# Patient Record
Sex: Male | Born: 1940 | Race: White | Hispanic: No | Marital: Married | Smoking: Former smoker
Health system: Southern US, Community
[De-identification: ages and names within clinical notes are randomized; demographics above are authoritative.]

## PROBLEM LIST (undated history)

## (undated) DIAGNOSIS — D649 Anemia, unspecified: Secondary | ICD-10-CM

## (undated) DIAGNOSIS — I10 Essential (primary) hypertension: Secondary | ICD-10-CM

## (undated) DIAGNOSIS — I739 Peripheral vascular disease, unspecified: Secondary | ICD-10-CM

## (undated) DIAGNOSIS — I6529 Occlusion and stenosis of unspecified carotid artery: Secondary | ICD-10-CM

## (undated) DIAGNOSIS — C801 Malignant (primary) neoplasm, unspecified: Secondary | ICD-10-CM

## (undated) DIAGNOSIS — R0600 Dyspnea, unspecified: Secondary | ICD-10-CM

## (undated) DIAGNOSIS — J189 Pneumonia, unspecified organism: Secondary | ICD-10-CM

## (undated) DIAGNOSIS — I2510 Atherosclerotic heart disease of native coronary artery without angina pectoris: Secondary | ICD-10-CM

## (undated) DIAGNOSIS — I255 Ischemic cardiomyopathy: Secondary | ICD-10-CM

## (undated) DIAGNOSIS — M1990 Unspecified osteoarthritis, unspecified site: Secondary | ICD-10-CM

## (undated) DIAGNOSIS — E1165 Type 2 diabetes mellitus with hyperglycemia: Secondary | ICD-10-CM

## (undated) DIAGNOSIS — I951 Orthostatic hypotension: Secondary | ICD-10-CM

## (undated) DIAGNOSIS — I5022 Chronic systolic (congestive) heart failure: Secondary | ICD-10-CM

## (undated) DIAGNOSIS — E785 Hyperlipidemia, unspecified: Secondary | ICD-10-CM

## (undated) DIAGNOSIS — M109 Gout, unspecified: Secondary | ICD-10-CM

## (undated) DIAGNOSIS — N182 Chronic kidney disease, stage 2 (mild): Secondary | ICD-10-CM

## (undated) DIAGNOSIS — IMO0001 Reserved for inherently not codable concepts without codable children: Secondary | ICD-10-CM

## (undated) DIAGNOSIS — R590 Localized enlarged lymph nodes: Secondary | ICD-10-CM

## (undated) DIAGNOSIS — J449 Chronic obstructive pulmonary disease, unspecified: Secondary | ICD-10-CM

## (undated) HISTORY — DX: Chronic systolic (congestive) heart failure: I50.22

## (undated) HISTORY — DX: Ischemic cardiomyopathy: I25.5

## (undated) HISTORY — DX: Essential (primary) hypertension: I10

## (undated) HISTORY — PX: CATARACT EXTRACTION: SUR2

## (undated) HISTORY — PX: CHOLECYSTECTOMY: SHX55

## (undated) HISTORY — DX: Orthostatic hypotension: I95.1

## (undated) HISTORY — PX: MELANOMA EXCISION: SHX5266

## (undated) HISTORY — PX: SKIN LESION EXCISION: SHX2412

## (undated) HISTORY — DX: Reserved for inherently not codable concepts without codable children: IMO0001

## (undated) HISTORY — DX: Peripheral vascular disease, unspecified: I73.9

## (undated) HISTORY — DX: Chronic kidney disease, stage 2 (mild): N18.2

## (undated) HISTORY — DX: Hyperlipidemia, unspecified: E78.5

## (undated) HISTORY — DX: Occlusion and stenosis of unspecified carotid artery: I65.29

## (undated) HISTORY — DX: Atherosclerotic heart disease of native coronary artery without angina pectoris: I25.10

## (undated) HISTORY — DX: Type 2 diabetes mellitus with hyperglycemia: E11.65

## (undated) HISTORY — PX: COLONOSCOPY: SHX174

## (undated) HISTORY — PX: HERNIA REPAIR: SHX51

## (undated) HISTORY — DX: Gout, unspecified: M10.9

---

## 2012-07-13 ENCOUNTER — Other Ambulatory Visit: Payer: Self-pay | Admitting: Internal Medicine

## 2012-07-13 DIAGNOSIS — I779 Disorder of arteries and arterioles, unspecified: Secondary | ICD-10-CM

## 2012-07-20 ENCOUNTER — Ambulatory Visit
Admission: RE | Admit: 2012-07-20 | Discharge: 2012-07-20 | Disposition: A | Discharge status: Home / Self Care | Payer: Medicare Other | Source: Ambulatory Visit | Attending: Internal Medicine | Admitting: Internal Medicine

## 2012-07-20 DIAGNOSIS — I779 Disorder of arteries and arterioles, unspecified: Secondary | ICD-10-CM

## 2013-01-03 ENCOUNTER — Other Ambulatory Visit: Payer: Self-pay | Admitting: *Deleted

## 2013-01-03 MED ORDER — ATORVASTATIN CALCIUM 40 MG PO TABS: 40.0000 mg | ORAL_TABLET | Freq: Every day | ORAL | Status: DC

## 2013-01-25 ENCOUNTER — Telehealth: Payer: Self-pay | Admitting: Endocrinology

## 2013-01-25 ENCOUNTER — Other Ambulatory Visit: Payer: Self-pay | Admitting: *Deleted

## 2013-01-25 DIAGNOSIS — IMO0001 Reserved for inherently not codable concepts without codable children: Secondary | ICD-10-CM | POA: Insufficient documentation

## 2013-01-25 DIAGNOSIS — E119 Type 2 diabetes mellitus without complications: Secondary | ICD-10-CM

## 2013-01-25 DIAGNOSIS — E785 Hyperlipidemia, unspecified: Secondary | ICD-10-CM | POA: Insufficient documentation

## 2013-01-25 MED ORDER — SITAGLIPTIN PHOSPHATE 100 MG PO TABS: 100.0000 mg | ORAL_TABLET | Freq: Every day | ORAL | Status: DC

## 2013-01-25 NOTE — Telephone Encounter (Signed)
rx sent

## 2013-01-28 ENCOUNTER — Other Ambulatory Visit (INDEPENDENT_AMBULATORY_CARE_PROVIDER_SITE_OTHER): Payer: Medicare Other

## 2013-01-28 DIAGNOSIS — E119 Type 2 diabetes mellitus without complications: Secondary | ICD-10-CM

## 2013-01-28 DIAGNOSIS — E785 Hyperlipidemia, unspecified: Secondary | ICD-10-CM

## 2013-01-28 LAB — URINALYSIS, ROUTINE W REFLEX MICROSCOPIC
Bilirubin Urine: NEGATIVE
Hgb urine dipstick: NEGATIVE
Ketones, ur: NEGATIVE
Leukocytes, UA: NEGATIVE
Nitrite: NEGATIVE
RBC / HPF: NONE SEEN (ref 0–?)
Specific Gravity, Urine: 1.025 (ref 1.000–1.030)
Total Protein, Urine: 30
Urine Glucose: NEGATIVE
Urobilinogen, UA: 0.2 (ref 0.0–1.0)
pH: 6 (ref 5.0–8.0)

## 2013-01-28 LAB — COMPREHENSIVE METABOLIC PANEL
ALT: 17 U/L (ref 0–53)
AST: 16 U/L (ref 0–37)
Albumin: 3.9 g/dL (ref 3.5–5.2)
Alkaline Phosphatase: 52 U/L (ref 39–117)
BUN: 17 mg/dL (ref 6–23)
CO2: 25 mEq/L (ref 19–32)
Calcium: 9.2 mg/dL (ref 8.4–10.5)
Chloride: 112 mEq/L (ref 96–112)
Creatinine, Ser: 1.4 mg/dL (ref 0.4–1.5)
GFR: 53.86 mL/min — ABNORMAL LOW (ref >60.00)
Glucose, Bld: 176 mg/dL — ABNORMAL HIGH (ref 70–99)
Potassium: 4.5 mEq/L (ref 3.5–5.1)
Sodium: 140 mEq/L (ref 135–145)
Total Bilirubin: 0.6 mg/dL (ref 0.3–1.2)
Total Protein: 5.9 g/dL — ABNORMAL LOW (ref 6.0–8.3)

## 2013-01-28 LAB — MICROALBUMIN / CREATININE URINE RATIO
Creatinine,U: 171.7 mg/dL
Microalb Creat Ratio: 12.8 mg/g (ref 0.0–30.0)
Microalb, Ur: 21.9 mg/dL — ABNORMAL HIGH (ref 0.0–1.9)

## 2013-01-28 LAB — LIPID PANEL
Cholesterol: 111 mg/dL (ref 0–200)
HDL: 39.7 mg/dL (ref >39.00)
LDL Cholesterol: 55 mg/dL (ref 0–99)
Total CHOL/HDL Ratio: 3
Triglycerides: 81 mg/dL (ref 0.0–149.0)
VLDL: 16.2 mg/dL (ref 0.0–40.0)

## 2013-01-28 LAB — HEMOGLOBIN A1C: Hgb A1c MFr Bld: 8.2 % — ABNORMAL HIGH (ref 4.6–6.5)

## 2013-01-31 ENCOUNTER — Ambulatory Visit: Payer: Medicare Other | Admitting: Endocrinology

## 2013-02-07 ENCOUNTER — Other Ambulatory Visit: Payer: Self-pay | Admitting: *Deleted

## 2013-02-07 ENCOUNTER — Ambulatory Visit (INDEPENDENT_AMBULATORY_CARE_PROVIDER_SITE_OTHER): Payer: Medicare Other | Admitting: Endocrinology

## 2013-02-07 ENCOUNTER — Encounter: Payer: Self-pay | Admitting: Endocrinology

## 2013-02-07 VITALS — BP 146/68 | HR 60 | Temp 98.6°F | Resp 12 | Ht 73.0 in | Wt 174.7 lb

## 2013-02-07 DIAGNOSIS — IMO0001 Reserved for inherently not codable concepts without codable children: Secondary | ICD-10-CM

## 2013-02-07 MED ORDER — CANAGLIFLOZIN 100 MG PO TABS: 100.0000 mg | ORAL_TABLET | Freq: Every day | ORAL | Status: DC

## 2013-02-07 NOTE — Progress Notes (Signed)
Patient ID: Mario [name], male   DOB: Dec 06, 1940, 72 y.o.   MRN: [id number]  Mario [name] is an 72 y.o. male.   Reason for Appointment: Diabetes follow-up   History of Present Illness   Diagnosis: Type 2 DIABETES MELITUS, date of diagnosis:  1989     Previous history: He was initially diagnosed when hospitalized for pancreatitis and his previous endocrinologist had treated him with multiple medications because of progression of his diabetes. He was also put on Cycloset  which he has tolerated maximum dose He has had adjustments of his medication dosages the last couple of years and Januvia was restarted in 5/14 when blood sugars were higher. Dosages have been adjusted based on renal function Previously an A1c levels have been ranging from 6.6-7.5  Recent history:  His metformin was increased on the previous visit when her renal function was better and glipizide was changed to glipizide ER. However he thinks his blood sugars are relatively higher although he is checking readings mostly in the mornings only. Blood sugars are averaging about 150 in the morning compared to 146 on the last visit. However his A1c is significantly higher than before  Oral hypoglycemic drugs: Januvia, Cycloset, Glyset, glipizide,? Metformin        Side effects from medications: None Proper timing of medications in relation to meals:  no, he takes Glyset about an hour before breakfast and sometime after supper.    Monitors blood glucose: Once a day.    Glucometer:  contour         Blood Glucose readings from meter download: readings before breakfast: Averaging 150 range 144-163, 4 PM 154, 11 PM 51   Hypoglycemia frequency:  only once        Meals: 3 meals per day.          Physical activity: exercise: Less recently, only 1-2/7 days a week playing tennis             Wt Readings from Last 3 Encounters:  02/07/13 174 lb 11.2 oz (79.243 kg)    LABS:  No visits with results within 1 Week(s) from this  visit. Latest known visit with results is:  Appointment on 01/28/2013  Component Date Value Range Status  . Hemoglobin A1C 01/28/2013 8.2* 4.6 - 6.5 % Final   Glycemic Control Guidelines for People with Diabetes:Non Diabetic:  <6%Goal of Therapy: <7%Additional Action Suggested:  >8%   . Microalb, Ur 01/28/2013 21.9* 0.0 - 1.9 mg/dL Final  . Creatinine,U 14/78/2956 171.7   Final  . Microalb Creat Ratio 01/28/2013 12.8  0.0 - 30.0 mg/g Final  . Sodium 01/28/2013 140  135 - 145 mEq/L Final  . Potassium 01/28/2013 4.5  3.5 - 5.1 mEq/L Final  . Chloride 01/28/2013 112  96 - 112 mEq/L Final  . CO2 01/28/2013 25  19 - 32 mEq/L Final  . Glucose, Bld 01/28/2013 176* 70 - 99 mg/dL Final  . BUN 21/30/8657 17  6 - 23 mg/dL Final  . Creatinine, Ser 01/28/2013 1.4  0.4 - 1.5 mg/dL Final  . Total Bilirubin 01/28/2013 0.6  0.3 - 1.2 mg/dL Final  . Alkaline Phosphatase 01/28/2013 52  39 - 117 U/L Final  . AST 01/28/2013 16  0 - 37 U/L Final  . ALT 01/28/2013 17  0 - 53 U/L Final  . Total Protein 01/28/2013 5.9* 6.0 - 8.3 g/dL Final  . Albumin 84/69/6295 3.9  3.5 - 5.2 g/dL Final  . Calcium 28/41/3244 9.2  8.4 -  10.5 mg/dL Final  . GFR 16/02/9603 53.86* >60.00 mL/min Final  . Cholesterol 01/28/2013 111  0 - 200 mg/dL Final   ATP III Classification       Desirable:  < 200 mg/dL               Borderline High:  200 - 239 mg/dL          High:  > = 540 mg/dL  . Triglycerides 01/28/2013 81.0  0.0 - 149.0 mg/dL Final   Normal:  <981 mg/dLBorderline High:  150 - 199 mg/dL  . HDL 01/28/2013 39.70  >39.00 mg/dL Final  . VLDL 19/14/7829 16.2  0.0 - 40.0 mg/dL Final  . LDL Cholesterol 01/28/2013 55  0 - 99 mg/dL Final  . Total CHOL/HDL Ratio 01/28/2013 3   Final                  Men          Women1/2 Average Risk     3.4          3.3Average Risk          5.0          4.42X Average Risk          9.6          7.13X Average Risk          15.0          11.0                      . Color, Urine 01/28/2013 LT. YELLOW   Yellow;Lt. Yellow Final  . APPearance 01/28/2013 CLEAR  Clear Final  . Specific Gravity, Urine 01/28/2013 1.025  1.000-1.030 Final  . pH 01/28/2013 6.0  5.0 - 8.0 Final  . Total Protein, Urine 01/28/2013 30  Negative Final  . Urine Glucose 01/28/2013 NEGATIVE  Negative Final  . Ketones, ur 01/28/2013 NEGATIVE  Negative Final  . Bilirubin Urine 01/28/2013 NEGATIVE  Negative Final  . Hgb urine dipstick 01/28/2013 NEGATIVE  Negative Final  . Urobilinogen, UA 01/28/2013 0.2  0.0 - 1.0 Final  . Leukocytes, UA 01/28/2013 NEGATIVE  Negative Final  . Nitrite 01/28/2013 NEGATIVE  Negative Final  . WBC, UA 01/28/2013 0-2/hpf  0-2/hpf Final  . RBC / HPF 01/28/2013 none seen  0-2/hpf Final  . Squamous Epithelial / LPF 01/28/2013 Rare(0-4/hpf)  Rare(0-4/hpf) Final  . Bacteria, UA 01/28/2013 Rare(<10/hpf)  None Final  . Sperm, UA 01/28/2013 Presence of  None Final      Medication List       This list is accurate as of: 02/07/13  2:01 PM.  Always use your most recent med list.               allopurinol 100 MG tablet  Commonly known as:  ZYLOPRIM     atorvastatin 40 MG tablet  Commonly known as:  LIPITOR  Take 1 tablet (40 mg total) by mouth daily.     CYCLOSET 0.8 MG Tabs  Generic drug:  Bromocriptine Mesylate     fenofibrate micronized 43 MG capsule  Commonly known as:  ANTARA     glipiZIDE 10 MG 24 hr tablet  Commonly known as:  GLUCOTROL XL     GLYSET 50 MG tablet  Generic drug:  miglitol     nebivolol 10 MG tablet  Commonly known as:  BYSTOLIC  Take 10 mg by mouth daily.     sitaGLIPtin 100 MG  tablet  Commonly known as:  JANUVIA  Take 1 tablet (100 mg total) by mouth daily.     tamsulosin 0.4 MG Caps capsule  Commonly known as:  FLOMAX        Allergies:  Allergies  Allergen Reactions  . Penicillins   . Plavix [Clopidogrel Bisulfate] Hives    No past medical history on file.  No past surgical history on file.  No family history on file.  Social History:   reports that he has been smoking.  He has never used smokeless tobacco. His alcohol and drug histories are not on file.  Review of Systems:  Hypertension:  currently only on Bystolic, also followed by cardiologist  Lipids: Well controlled with atorvastatin Lab Results  Component Value Date   LDLCALC 55 01/28/2013         Examination:   BP 146/68  Pulse 60  Temp(Src) 98.6 F (37 C)  Resp 12  Ht 6\' 1"  (1.854 m)  Wt 174 lb 11.2 oz (79.243 kg)  BMI 23.05 kg/m2  SpO2 97%  Body mass index is 23.05 kg/(m^2).    ASSESSMENT/ PLAN::   Diabetes type 2   Blood glucose control relatively worse with increasing A1c. This is despite his fasting readings looking about the same as the last time He is not checking readings after meals and most likely has post prandial hyperglycemia. This is partly from his not taking his Glyset before eating especially at suppertime and taking it much before breakfast in the morning. He is not obese and since his renal function is improved may be able to get better controlled with using Invokana 100 mg. He is also concerned about the cost of Cycloset tablets and then stop this when finished. He can continue his other regimen of glipizide ER, Glyset and Januvia. Need to check if he is taking metformin He will need to take his Glyset before his main meals and discussed timing of this medication Discussed needing to check more readings after meals He can also try to increase his exercise regimen which he has not been able to do regularly  Hyperlipidemia: LDL below 70  Mario [name] 02/07/2013, 2:01 PM

## 2013-02-07 NOTE — Patient Instructions (Addendum)
Invokana 100 mg in am instead of Cycloset  Must take GLYSET JUST BEFORE BFST AND SUPPER  Please check blood sugars at least half the time about 2 hours after any meal and as directed on waking up. Please bring blood sugar monitor to each visit  Check BP weekly

## 2013-02-11 ENCOUNTER — Telehealth: Payer: Self-pay | Admitting: Endocrinology

## 2013-02-11 NOTE — Telephone Encounter (Signed)
Requests referral to an eye doc and podiatrist. Please call / Sherri S.

## 2013-02-12 ENCOUNTER — Telehealth: Payer: Self-pay | Admitting: *Deleted

## 2013-02-12 NOTE — Telephone Encounter (Signed)
He can call himself to [facility] and also either Dr. Hazle Quant or Dr. Dione Booze Also need to find out if he is taking metformin, not on his list for unknown reason

## 2013-02-12 NOTE — Telephone Encounter (Signed)
Called pt and lvm advising him per Dr Lucianne Muss that he does not need a referral to the Podiatrist or the Eye Doctor. He can call and schedule and appt himself to [facility] and for and Eye Doctor Dr. Hazle Quant or Dr. Dione Booze. I asked if he is taking metformin, it is not on his list for unknown reason. Advised pt to call Carollee Herter or Bjorn Loser with that information.

## 2013-02-12 NOTE — Telephone Encounter (Signed)
Please read note below and advise.  

## 2013-02-15 ENCOUNTER — Other Ambulatory Visit: Payer: Self-pay | Admitting: *Deleted

## 2013-04-01 ENCOUNTER — Encounter: Payer: Self-pay | Admitting: Interventional Cardiology

## 2013-04-09 ENCOUNTER — Other Ambulatory Visit (INDEPENDENT_AMBULATORY_CARE_PROVIDER_SITE_OTHER): Payer: Medicare Other

## 2013-04-09 ENCOUNTER — Other Ambulatory Visit: Payer: Medicare Other

## 2013-04-09 DIAGNOSIS — IMO0001 Reserved for inherently not codable concepts without codable children: Secondary | ICD-10-CM

## 2013-04-09 LAB — BASIC METABOLIC PANEL
BUN: 19 mg/dL (ref 6–23)
CO2: 24 mEq/L (ref 19–32)
Calcium: 9.5 mg/dL (ref 8.4–10.5)
Chloride: 108 mEq/L (ref 96–112)
Creatinine, Ser: 1.3 mg/dL (ref 0.4–1.5)
GFR: 59.25 mL/min — ABNORMAL LOW (ref >60.00)
Glucose, Bld: 176 mg/dL — ABNORMAL HIGH (ref 70–99)
Potassium: 4.3 mEq/L (ref 3.5–5.1)
Sodium: 137 mEq/L (ref 135–145)

## 2013-04-09 LAB — FRUCTOSAMINE: Fructosamine: 298 umol/L — ABNORMAL HIGH (ref <285)

## 2013-04-11 ENCOUNTER — Encounter: Payer: Self-pay | Admitting: Endocrinology

## 2013-04-11 ENCOUNTER — Ambulatory Visit (INDEPENDENT_AMBULATORY_CARE_PROVIDER_SITE_OTHER): Payer: Medicare Other | Admitting: Endocrinology

## 2013-04-11 VITALS — BP 138/70 | HR 71 | Temp 98.6°F | Resp 12 | Ht 73.0 in | Wt 170.4 lb

## 2013-04-11 DIAGNOSIS — E785 Hyperlipidemia, unspecified: Secondary | ICD-10-CM

## 2013-04-11 DIAGNOSIS — IMO0001 Reserved for inherently not codable concepts without codable children: Secondary | ICD-10-CM

## 2013-04-11 DIAGNOSIS — N182 Chronic kidney disease, stage 2 (mild): Secondary | ICD-10-CM

## 2013-04-11 NOTE — Progress Notes (Signed)
Patient ID: Mario [name], male   DOB: 11/17/1940, 72 y.o.   MRN: [id number]  Mario [name] is an 72 y.o. male.   Reason for Appointment: Diabetes follow-up   History of Present Illness   Diagnosis: Type 2 DIABETES MELITUS, date of diagnosis:  1989     Previous history: He was initially diagnosed when hospitalized for pancreatitis and his previous endocrinologist had treated him with multiple medications because of progression of his diabetes. He was also put on Cycloset  which he has tolerated Mario dose He has had adjustments of his medication dosages the last couple of years and Januvia was restarted in 5/14 when blood sugars were higher. Dosages have been adjusted based on renal function Previously an A1c levels have been ranging from 6.6-7.5 His metformin was increased  when renal function was better and glipizide was changed to glipizide ER 10 mg.  Recent history: He was given a trial of Invokana in addition to his regimen of metformin, glipizide ER, Januvia and Glyset on his last visit However he thinks his blood sugars did not improve with this but they are relatively better recently He was also told to change his timing of the Glyset before meals rather than after Has not taken Invokana recently and also not taking Cycloset because of cost However he is checking readings mostly in the mornings only.  Blood sugars are averaging about 140 in the morning compared to 150 on the last visit. On his last visit  his A1c was significantly higher than before but his fructosamine is near normal now  Oral hypoglycemic drugs: Januvia, Glyset, glipizide, Metformin        Side effects from medications: None Proper timing of medications in relation to meals: Glyset  Monitors blood glucose: Once a day.    Glucometer:  contour         Blood Glucose readings from meter download: readings before breakfast: Averaging 140 range 115-172   Hypoglycemia frequency:  only once        Meals: 3 meals per  day.          Physical activity: exercise: Less recently            Wt Readings from Last 3 Encounters:  04/11/13 170 lb 6.4 oz (77.293 kg)  02/07/13 174 lb 11.2 oz (79.243 kg)    LABS:  Lab Results  Component Value Date   HGBA1C 8.2* 01/28/2013   Lab Results  Component Value Date   MICROALBUR 21.9* 01/28/2013   LDLCALC 55 01/28/2013   CREATININE 1.3 04/09/2013     Appointment on 04/09/2013  Component Date Value Range Status  . Sodium 04/09/2013 137  135 - 145 mEq/L Final  . Potassium 04/09/2013 4.3  3.5 - 5.1 mEq/L Final  . Chloride 04/09/2013 108  96 - 112 mEq/L Final  . CO2 04/09/2013 24  19 - 32 mEq/L Final  . Glucose, Bld 04/09/2013 176* 70 - 99 mg/dL Final  . BUN 11/91/4782 19  6 - 23 mg/dL Final  . Creatinine, Ser 04/09/2013 1.3  0.4 - 1.5 mg/dL Final  . Calcium 95/62/1308 9.5  8.4 - 10.5 mg/dL Final  . GFR 65/78/4696 59.25* >60.00 mL/min Final  . Fructosamine 04/09/2013 298* <285 umol/L Final   Comment:                            Variations in levels of serum proteins (albumin and immunoglobulins)  may affect fructosamine results.                                 Medication List       This list is accurate as of: 04/11/13 11:15 AM.  Always use your most recent med list.               allopurinol 100 MG tablet  Commonly known as:  ZYLOPRIM     atorvastatin 40 MG tablet  Commonly known as:  LIPITOR  Take 1 tablet (40 mg total) by mouth daily.     Canagliflozin 100 MG Tabs  Commonly known as:  INVOKANA  Take 1 tablet (100 mg total) by mouth daily.     CYCLOSET 0.8 MG Tabs  Generic drug:  Bromocriptine Mesylate     fenofibrate micronized 43 MG capsule  Commonly known as:  ANTARA     glipiZIDE 10 MG 24 hr tablet  Commonly known as:  GLUCOTROL XL     GLYSET 50 MG tablet  Generic drug:  miglitol     metFORMIN 1000 MG tablet  Commonly known as:  GLUCOPHAGE  Take 1,000 mg by mouth 2 (two) times daily with a meal.      nebivolol 10 MG tablet  Commonly known as:  BYSTOLIC  Take 10 mg by mouth daily. Taking 5 mg     sitaGLIPtin 100 MG tablet  Commonly known as:  JANUVIA  Take 1 tablet (100 mg total) by mouth daily.     tamsulosin 0.4 MG Caps capsule  Commonly known as:  FLOMAX        Allergies:  Allergies  Allergen Reactions  . Penicillins   . Plavix [Clopidogrel Bisulfate] Hives    No past medical history on file.  No past surgical history on file.  No family history on file.  Social History:  reports that he has been smoking.  He has never used smokeless tobacco. His alcohol and drug histories are not on file.  Review of Systems:  Hypertension:  currently only on Bystolic, also followed by cardiologist  Lipids: Well controlled with atorvastatin Lab Results  Component Value Date   LDLCALC 55 01/28/2013     Examination:   BP 138/70  Pulse 71  Temp(Src) 98.6 F (37 C)  Resp 12  Ht 6\' 1"  (1.854 m)  Wt 170 lb 6.4 oz (77.293 kg)  BMI 22.49 kg/m2  SpO2 97%  Body mass index is 22.49 kg/(m^2).    ASSESSMENT/ PLAN::   Diabetes type 2   Blood glucose control is relatively as judged by his fructosamine Although his A1c was 8.2% on the last visit this may have been more related to higher postprandial readings These may be better with taking his Glyset appropriately before his meals He reports no improvement with Invokana and will not retry. Discussed taking more readings after meals He would also benefit from starting back on his exercise program Given him a co-pay card for his [place] 04/11/2013, 11:15 AM

## 2013-04-11 NOTE — Patient Instructions (Addendum)
Please check blood sugars at least half the time about 2 hours after any meal and every 2 days on waking up.  Please bring blood sugar monitor to each visit  Exercise 2-3 times a week

## 2013-04-12 DIAGNOSIS — N182 Chronic kidney disease, stage 2 (mild): Secondary | ICD-10-CM | POA: Insufficient documentation

## 2013-04-17 ENCOUNTER — Telehealth: Payer: Self-pay | Admitting: Endocrinology

## 2013-04-17 NOTE — Telephone Encounter (Signed)
Labs mailed

## 2013-04-17 NOTE — Telephone Encounter (Signed)
Pt would like copy of his blood work  Call back [phone]  Thank You :)

## 2013-04-25 ENCOUNTER — Other Ambulatory Visit: Payer: Self-pay | Admitting: *Deleted

## 2013-04-25 MED ORDER — TAMSULOSIN HCL 0.4 MG PO CAPS: 0.4000 mg | ORAL_CAPSULE | Freq: Every day | ORAL | Status: DC

## 2013-05-03 ENCOUNTER — Other Ambulatory Visit: Payer: Self-pay | Admitting: *Deleted

## 2013-05-03 MED ORDER — GLIPIZIDE ER 10 MG PO TB24: 10.0000 mg | ORAL_TABLET | Freq: Every day | ORAL | Status: DC

## 2013-05-09 ENCOUNTER — Other Ambulatory Visit: Payer: Self-pay | Admitting: *Deleted

## 2013-05-09 MED ORDER — GLUCOSE BLOOD VI STRP: ORAL_STRIP | Status: DC

## 2013-05-09 MED ORDER — BAYER MICROLET LANCETS MISC: Status: DC

## 2013-05-27 ENCOUNTER — Other Ambulatory Visit: Payer: Self-pay | Admitting: *Deleted

## 2013-05-27 MED ORDER — METFORMIN HCL 1000 MG PO TABS: 1000.0000 mg | ORAL_TABLET | Freq: Two times a day (BID) | ORAL | Status: DC

## 2013-05-28 ENCOUNTER — Other Ambulatory Visit: Payer: Self-pay | Admitting: *Deleted

## 2013-05-28 ENCOUNTER — Telehealth: Payer: Self-pay | Admitting: *Deleted

## 2013-05-28 MED ORDER — GLIPIZIDE ER 10 MG PO TB24: 10.0000 mg | ORAL_TABLET | Freq: Every day | ORAL | Status: DC

## 2013-05-28 MED ORDER — ATORVASTATIN CALCIUM 40 MG PO TABS: 40.0000 mg | ORAL_TABLET | Freq: Every day | ORAL | Status: DC

## 2013-05-28 MED ORDER — ALLOPURINOL 100 MG PO TABS: 100.0000 mg | ORAL_TABLET | Freq: Every day | ORAL | Status: DC

## 2013-05-28 NOTE — Telephone Encounter (Signed)
cycloset would be cheaper than what Rx? He  can take the fenofibrate 48 mg instead of the 43 mg

## 2013-05-28 NOTE — Telephone Encounter (Signed)
Pt called and wants [place] to call his new insurance company to get a step therapy approved for his medication, he said the cycloset would be cheaper for him.  He also wants to know if he can take the fenofibrate 48 mg instead of the 43 mg, he said that would be a lot cheaper if he could change the dose.

## 2013-05-29 NOTE — Telephone Encounter (Signed)
Cycloset 6 tablets daily

## 2013-06-07 ENCOUNTER — Other Ambulatory Visit: Payer: Self-pay | Admitting: *Deleted

## 2013-06-11 ENCOUNTER — Other Ambulatory Visit: Payer: Self-pay | Admitting: Endocrinology

## 2013-06-11 ENCOUNTER — Other Ambulatory Visit: Payer: Medicare Other

## 2013-06-11 ENCOUNTER — Other Ambulatory Visit: Payer: Self-pay | Admitting: *Deleted

## 2013-06-11 LAB — COMPREHENSIVE METABOLIC PANEL
ALT: 25 U/L (ref 0–53)
AST: 25 U/L (ref 0–37)
Albumin: 4.1 g/dL (ref 3.5–5.2)
Alkaline Phosphatase: 55 U/L (ref 39–117)
BUN: 20 mg/dL (ref 6–23)
CO2: 22 mEq/L (ref 19–32)
Calcium: 9 mg/dL (ref 8.4–10.5)
Chloride: 108 mEq/L (ref 96–112)
Creat: 1.17 mg/dL (ref 0.50–1.35)
Glucose, Bld: 176 mg/dL — ABNORMAL HIGH (ref 70–99)
Potassium: 4.6 mEq/L (ref 3.5–5.3)
Sodium: 138 mEq/L (ref 135–145)
Total Bilirubin: 0.7 mg/dL (ref 0.3–1.2)
Total Protein: 6 g/dL (ref 6.0–8.3)

## 2013-06-11 LAB — HEMOGLOBIN A1C
Hgb A1c MFr Bld: 8.5 % — ABNORMAL HIGH (ref <5.7)
Mean Plasma Glucose: 197 mg/dL — ABNORMAL HIGH (ref <117)

## 2013-06-11 MED ORDER — NEBIVOLOL HCL 10 MG PO TABS: ORAL_TABLET | ORAL | Status: DC

## 2013-06-11 MED ORDER — MIGLITOL 50 MG PO TABS: 50.0000 mg | ORAL_TABLET | Freq: Three times a day (TID) | ORAL | Status: DC

## 2013-06-13 ENCOUNTER — Ambulatory Visit (INDEPENDENT_AMBULATORY_CARE_PROVIDER_SITE_OTHER): Payer: Commercial Managed Care - HMO | Admitting: Endocrinology

## 2013-06-13 ENCOUNTER — Encounter: Payer: Self-pay | Admitting: Endocrinology

## 2013-06-13 VITALS — BP 124/60 | HR 67 | Temp 98.2°F | Resp 14 | Ht 74.0 in | Wt 178.0 lb

## 2013-06-13 DIAGNOSIS — IMO0001 Reserved for inherently not codable concepts without codable children: Secondary | ICD-10-CM

## 2013-06-13 DIAGNOSIS — I10 Essential (primary) hypertension: Secondary | ICD-10-CM

## 2013-06-13 DIAGNOSIS — E1165 Type 2 diabetes mellitus with hyperglycemia: Principal | ICD-10-CM

## 2013-06-13 DIAGNOSIS — E785 Hyperlipidemia, unspecified: Secondary | ICD-10-CM

## 2013-06-13 NOTE — Progress Notes (Signed)
Patient ID: Mario [name], male   DOB: 1941/03/15, 73 y.o.   MRN: [id number]   Reason for Appointment: Diabetes follow-up   History of Present Illness   Diagnosis: Type 2 DIABETES MELITUS, date of diagnosis:  1989     Previous history: He was initially diagnosed when hospitalized for pancreatitis and his previous endocrinologist had treated him with multiple medications because of progression of his diabetes. He was also put on Cycloset  which he has tolerated maximum dose He has had adjustments of his medication dosages the last couple of years and Januvia was restarted in 5/14 when blood sugars were higher. Dosages have been adjusted based on renal function Previously an A1c levels have been ranging from 6.6-7.5 His metformin was increased  when renal function was better and glipizide was changed to glipizide ER 10 mg.  Recent history: He did not appear to have improved sugars with Invokana in addition to his regimen of metformin, glipizide ER, Januvia and Glyset  However since his blood sugars were somewhat better on his last visit his regimen was not changed. He was having relatively good readings in the mornings and was instructed to take Glyset more consistently right before eating rather than after eating to help postprandial hyperglycemia. He is not able to comply with this all the time  However he is again checking readings in the mornings only.  Blood sugars are averaging about 160 in the morning compared to 140 on the last visit. Also his A1c again higher than before even though his fructosamine had improved on the last visit His weight has also gone up significantly. His insurance company has denied Cycloset which he had taken previously with improvement in his blood sugars  Oral hypoglycemic drugs: Januvia, Glyset, glipizide, Metformin        Side effects from medications: None Proper timing of medications in relation to meals:  he is usually taking the Glyset right before  eating or when he is eating and occasionally after Monitors blood glucose: Once a day.    Glucometer:  contour         Blood Glucose readings from meter download: readings before breakfast: 129-215, average 160, higher readings in December  Hs 135 Hypoglycemia: None recently      Meals: 3 meals per day.          Physical activity: exercise: Tennis weekly, not going to the gym. He is doing part-time work delivering newspapers to businesses            [employer] from Last 3 Encounters:  06/13/13 178 lb (80.74 kg)  04/11/13 170 lb 6.4 oz (77.293 kg)  02/07/13 174 lb 11.2 oz (79.243 kg)    Lab Results  Component Value Date   HGBA1C 8.5* 06/11/2013   HGBA1C 8.2* 01/28/2013   Lab Results  Component Value Date   MICROALBUR 21.9* 01/28/2013   [place] 55 01/28/2013   CREATININE 1.17 06/11/2013    PROBLEM 2: Mild chronic kidney disease; his creatinine is relatively good now. No orthostatic symptoms of lightheadedness  LABS:  Orders Only on 06/11/2013  Component Date Value Range Status  . Sodium 06/11/2013 138  135 - 145 mEq/L Final  . Potassium 06/11/2013 4.6  3.5 - 5.3 mEq/L Final  . Chloride 06/11/2013 108  96 - 112 mEq/L Final  . CO2 06/11/2013 22  19 - 32 mEq/L Final  . Glucose, Bld 06/11/2013 176* 70 - 99 mg/dL Final  . BUN 06/11/2013 20  6 - 23 mg/dL  Final  . Creat 06/11/2013 1.17  0.50 - 1.35 mg/dL Final  . Total Bilirubin 06/11/2013 0.7  0.3 - 1.2 mg/dL Final  . Alkaline Phosphatase 06/11/2013 55  39 - 117 U/L Final  . AST 06/11/2013 25  0 - 37 U/L Final  . ALT 06/11/2013 25  0 - 53 U/L Final  . Total Protein 06/11/2013 6.0  6.0 - 8.3 g/dL Final  . Albumin 06/11/2013 4.1  3.5 - 5.2 g/dL Final  . Calcium 06/11/2013 9.0  8.4 - 10.5 mg/dL Final  . Hemoglobin A1C 06/11/2013 8.5* <5.7 % Final   Comment:                                                                                                 According to the ADA Clinical Practice Recommendations for 2011, when                           HbA1c is used as a screening test:                                                       >=6.5%   Diagnostic of Diabetes Mellitus                                     (if abnormal result is confirmed)                                                     5.7-6.4%   Increased risk of developing Diabetes Mellitus                                                     References:Diagnosis and Classification of Diabetes Mellitus,Diabetes                          EVOJ,5009,38(HWEXH 1):S62-S69 and Standards of Medical Care in                                  Diabetes - 2011,Diabetes Care,2011,34 (Suppl 1):S11-S61.                             . Mean Plasma Glucose 06/11/2013 197* <117 mg/dL Final      Medication List       This list is accurate as of: 06/13/13 10:43 AM.  Always use your most  recent med list.               allopurinol 100 MG tablet  Commonly known as:  ZYLOPRIM  Take 1 tablet (100 mg total) by mouth daily.     atorvastatin 40 MG tablet  Commonly known as:  LIPITOR  Take 1 tablet (40 mg total) by mouth daily.     BAYER MICROLET LANCETS lancets  Use as instructed to check blood sugars 2 times per day     fenofibrate micronized 43 MG capsule  Commonly known as:  ANTARA     glipiZIDE 10 MG 24 hr tablet  Commonly known as:  GLUCOTROL XL  Take 1 tablet (10 mg total) by mouth daily with breakfast.     glucose blood test strip  Commonly known as:  BAYER CONTOUR TEST  Use as instructed to check blood sugars 2 times per day dx code 250.02     metFORMIN 1000 MG tablet  Commonly known as:  GLUCOPHAGE  Take 1 tablet (1,000 mg total) by mouth 2 (two) times daily with a meal.     miglitol 50 MG tablet  Commonly known as:  GLYSET  Take 1 tablet (50 mg total) by mouth 3 (three) times daily with meals.     nebivolol 10 MG tablet  Commonly known as:  BYSTOLIC  Takes 1/2 tablet daily     sitaGLIPtin 100 MG tablet  Commonly known as:  JANUVIA  Take 1 tablet (100 mg  total) by mouth daily.     tamsulosin 0.4 MG Caps capsule  Commonly known as:  FLOMAX  Take 1 capsule (0.4 mg total) by mouth daily.        Allergies:  Allergies  Allergen Reactions  . Penicillins   . Plavix [Clopidogrel Bisulfate] Hives    No past medical history on file.  No past surgical history on file.  No family history on file.  Social History:  reports that he has been smoking.  He has never used smokeless tobacco. His alcohol and drug histories are not on file.  Review of Systems:  Hypertension:  currently controlled only on Bystolic, also followed by cardiologist  Lipids: Well controlled with atorvastatin Lab Results  Component Value Date   LDLCALC 55 01/28/2013    History of BPH   Examination:   BP 124/60  Pulse 67  Temp(Src) 98.2 F (36.8 C)  Resp 14  Ht 6\' 2"  (1.88 m)  Wt 178 lb (80.74 kg)  BMI 22.84 kg/m2  SpO2 98%  Body mass index is 22.84 kg/(m^2).    ASSESSMENT/ PLAN::   Diabetes type 2   Blood glucose control is relatively poor as judged by rising A1c Although he had somewhat higher readings in December likely to be from inconsistent diet he still has relatively higher readings in the morning. Problems identified:  Not checking readings after meals even with repeated instructions. He thinks he forgets or is too busy  Not enough exercise  Weight gain  Likely to be getting more insulin deficient and most of his hyperglycemia is likely to be postprandial  Occasionally not taking Glyset right before eating as directed for maximum benefit  Discussed above problems and solutions Although he can do better with diet and exercise he likely will need additional medications for blood sugar regulation Doubt he can easily controlled his blood sugars with mealtime insulin since this will be difficult to comply with Will get prior authorization done to his [facility] insurance again especially with prior  benefit from this drug  Total visit time  including medication and prescription management = 25 minutes  Levy Wellman 06/13/2013, 10:43 AM

## 2013-06-13 NOTE — Patient Instructions (Signed)
Please check blood sugars at least half the time about 2 hours after any meal and every 2 days on waking up.  Please bring blood sugar monitor to each visit

## 2013-06-18 ENCOUNTER — Telehealth: Payer: Self-pay | Admitting: Interventional Cardiology

## 2013-06-18 ENCOUNTER — Encounter: Payer: Self-pay | Admitting: Interventional Cardiology

## 2013-06-18 ENCOUNTER — Encounter: Payer: Self-pay | Admitting: *Deleted

## 2013-06-18 DIAGNOSIS — I739 Peripheral vascular disease, unspecified: Secondary | ICD-10-CM | POA: Insufficient documentation

## 2013-06-18 DIAGNOSIS — I951 Orthostatic hypotension: Secondary | ICD-10-CM | POA: Insufficient documentation

## 2013-06-18 DIAGNOSIS — I25810 Atherosclerosis of coronary artery bypass graft(s) without angina pectoris: Secondary | ICD-10-CM | POA: Insufficient documentation

## 2013-06-18 DIAGNOSIS — M109 Gout, unspecified: Secondary | ICD-10-CM | POA: Insufficient documentation

## 2013-06-18 NOTE — Telephone Encounter (Signed)
Med. Question'     Called pt to reschedule 2/4 appt.   Pt asked about med FENOFIDRATE  MICRONIZED  Pt would like to try something else this is to expensive. Pt wants to know if he has to take this med.  Pt stated he has stopped taking it. Can we suggested anything else?  Please give pt a call back.

## 2013-06-21 NOTE — Telephone Encounter (Signed)
We did not start that med. He needs to discuss with his physician who is responsible for refilling.

## 2013-06-26 ENCOUNTER — Ambulatory Visit: Payer: Medicare Other | Admitting: Interventional Cardiology

## 2013-06-26 NOTE — Telephone Encounter (Signed)
lmom.We did not start that med. He needs to discuss with his physician who is responsible for refilling

## 2013-06-28 NOTE — Telephone Encounter (Signed)
See below

## 2013-07-11 ENCOUNTER — Encounter: Payer: Self-pay | Admitting: Endocrinology

## 2013-07-19 ENCOUNTER — Other Ambulatory Visit: Payer: Self-pay | Admitting: *Deleted

## 2013-07-19 MED ORDER — BROMOCRIPTINE MESYLATE 0.8 MG PO TABS: ORAL_TABLET | ORAL | Status: DC

## 2013-07-19 MED ORDER — METFORMIN HCL 1000 MG PO TABS: 1000.0000 mg | ORAL_TABLET | Freq: Two times a day (BID) | ORAL | Status: DC

## 2013-07-29 ENCOUNTER — Ambulatory Visit: Payer: Commercial Managed Care - HMO | Admitting: Interventional Cardiology

## 2013-08-12 ENCOUNTER — Telehealth: Payer: Self-pay | Admitting: Endocrinology

## 2013-08-12 NOTE — Telephone Encounter (Signed)
Pt is in need of januvia samples pt is requesting for them to be ready tomorrow for when he comes for his labs

## 2013-08-13 ENCOUNTER — Other Ambulatory Visit: Payer: Self-pay | Admitting: *Deleted

## 2013-08-13 ENCOUNTER — Other Ambulatory Visit: Payer: Commercial Managed Care - HMO

## 2013-08-13 ENCOUNTER — Other Ambulatory Visit: Payer: Self-pay | Admitting: Endocrinology

## 2013-08-13 DIAGNOSIS — E1165 Type 2 diabetes mellitus with hyperglycemia: Principal | ICD-10-CM

## 2013-08-13 DIAGNOSIS — IMO0001 Reserved for inherently not codable concepts without codable children: Secondary | ICD-10-CM

## 2013-08-13 LAB — COMPREHENSIVE METABOLIC PANEL
ALT: 32 U/L (ref 0–53)
AST: 22 U/L (ref 0–37)
Albumin: 4.2 g/dL (ref 3.5–5.2)
Alkaline Phosphatase: 62 U/L (ref 39–117)
BUN: 20 mg/dL (ref 6–23)
CO2: 24 mEq/L (ref 19–32)
Calcium: 9.3 mg/dL (ref 8.4–10.5)
Chloride: 107 mEq/L (ref 96–112)
Creat: 1.14 mg/dL (ref 0.50–1.35)
Glucose, Bld: 168 mg/dL — ABNORMAL HIGH (ref 70–99)
Potassium: 4.4 mEq/L (ref 3.5–5.3)
Sodium: 140 mEq/L (ref 135–145)
Total Bilirubin: 1 mg/dL (ref 0.2–1.2)
Total Protein: 6 g/dL (ref 6.0–8.3)

## 2013-08-13 LAB — LIPID PANEL
Cholesterol: 115 mg/dL (ref 0–200)
HDL: 37 mg/dL — ABNORMAL LOW (ref >39)
LDL Cholesterol: 46 mg/dL (ref 0–99)
Total CHOL/HDL Ratio: 3.1 Ratio
Triglycerides: 158 mg/dL — ABNORMAL HIGH (ref <150)
VLDL: 32 mg/dL (ref 0–40)

## 2013-08-13 LAB — HEMOGLOBIN A1C
Hgb A1c MFr Bld: 8.2 % — ABNORMAL HIGH (ref <5.7)
Mean Plasma Glucose: 189 mg/dL — ABNORMAL HIGH (ref <117)

## 2013-08-15 ENCOUNTER — Encounter: Payer: Self-pay | Admitting: Endocrinology

## 2013-08-15 ENCOUNTER — Ambulatory Visit (INDEPENDENT_AMBULATORY_CARE_PROVIDER_SITE_OTHER): Payer: Commercial Managed Care - HMO | Admitting: Endocrinology

## 2013-08-15 VITALS — BP 110/60 | HR 78 | Temp 98.1°F | Resp 16 | Ht 74.0 in | Wt 175.6 lb

## 2013-08-15 DIAGNOSIS — E1159 Type 2 diabetes mellitus with other circulatory complications: Secondary | ICD-10-CM

## 2013-08-15 DIAGNOSIS — I10 Essential (primary) hypertension: Secondary | ICD-10-CM

## 2013-08-15 DIAGNOSIS — E785 Hyperlipidemia, unspecified: Secondary | ICD-10-CM

## 2013-08-15 DIAGNOSIS — N182 Chronic kidney disease, stage 2 (mild): Secondary | ICD-10-CM

## 2013-08-15 NOTE — Progress Notes (Signed)
Patient ID: Mario [name], male   DOB: 10-11-1940, 73 y.o.   MRN: [id number]   Reason for Appointment: Diabetes follow-up   History of Present Illness   Diagnosis: Type 2 DIABETES MELITUS, date of diagnosis:  1989     Previous history: He was initially diagnosed when hospitalized for pancreatitis and his previous endocrinologist had treated him with multiple medications because of progression of his diabetes. He was also put on Cycloset  which he has tolerated maximum dose He has had adjustments of his medication dosages the last couple of years and Januvia was restarted in 5/14 when blood sugars were higher. Dosages have been adjusted based on renal function Previously an A1c levels have been ranging from 6.6-7.5 His metformin was increased  when renal function was better and glipizide was changed to glipizide ER 10 mg. Did not appear to have better blood sugar control with Invokana  Recent history: His insurance denial for Cycloset was reversed after a letter was sent and he has been able to get it more reasonable price. He has started this about 5 weeks ago and now is taking 5 tablets in the morning without side effects With this his blood sugars continue to be better including in the morning; however has not done many readings after meals and only a couple of readings after lunch Has 2 recent high readings after lunch with going off his diet Weight has improved slightly and he is getting more active He is still on a multidrug regimen with 5 agents  Oral hypoglycemic drugs: Januvia, Glyset, glipizide, Metformin,Cycloset        Side effects from medications: None Proper timing of medications in relation to meals:  he is usually taking the Glyset right before eating   Monitors blood glucose: Once a day.    Glucometer:  contour         Blood Glucose readings from meter download:  PREMEAL Breakfast  PC Lunch Dinner Bedtime Overall  Glucose range:  107-157   147-247  ?  ?    Mean/median:   132  186     146    Hypoglycemia: None recently      Meals: 3 meals per day.          Physical activity: exercise: Tennis weekly, now going to the gym 2/7. He is doing part-time work delivering newspapers to businesses            [employer] from Last 3 Encounters:  08/15/13 175 lb 9.6 oz (79.652 kg)  06/13/13 178 lb (80.74 kg)  04/11/13 170 lb 6.4 oz (77.293 kg)    Lab Results  Component Value Date   HGBA1C 8.2* 08/13/2013   HGBA1C 8.5* 06/11/2013   HGBA1C 8.2* 01/28/2013   Lab Results  Component Value Date   MICROALBUR 21.9* 01/28/2013   LDLCALC 46 08/13/2013   CREATININE 1.14 08/13/2013    PROBLEM 2: Mild chronic kidney disease; his creatinine is relatively good now. No orthostatic symptoms of lightheadedness  LABS:  Orders Only on 08/13/2013  Component Date Value Ref Range Status  . Sodium 08/13/2013 140  135 - 145 mEq/L Final  . Potassium 08/13/2013 4.4  3.5 - 5.3 mEq/L Final  . Chloride 08/13/2013 107  96 - 112 mEq/L Final  . CO2 08/13/2013 24  19 - 32 mEq/L Final  . Glucose, Bld 08/13/2013 168* 70 - 99 mg/dL Final  . BUN 08/13/2013 20  6 - 23 mg/dL Final  . Creat 08/13/2013 1.14  0.50 -  1.35 mg/dL Final  . Total Bilirubin 08/13/2013 1.0  0.2 - 1.2 mg/dL Final  . Alkaline Phosphatase 08/13/2013 62  39 - 117 U/L Final  . AST 08/13/2013 22  0 - 37 U/L Final  . ALT 08/13/2013 32  0 - 53 U/L Final  . Total Protein 08/13/2013 6.0  6.0 - 8.3 g/dL Final  . Albumin 08/13/2013 4.2  3.5 - 5.2 g/dL Final  . Calcium 08/13/2013 9.3  8.4 - 10.5 mg/dL Final  . Cholesterol 08/13/2013 115  0 - 200 mg/dL Final   Comment: ATP III Classification:                                < 200        mg/dL        Desirable                               200 - 239     mg/dL        Borderline High                               >= 240        mg/dL        High                             . Triglycerides 08/13/2013 158* <150 mg/dL Final  . HDL 08/13/2013 37* >39 mg/dL Final  . Total CHOL/HDL Ratio  08/13/2013 3.1   Final  . VLDL 08/13/2013 32  0 - 40 mg/dL Final  . LDL Cholesterol 08/13/2013 46  0 - 99 mg/dL Final   Comment:                            Total Cholesterol/HDL Ratio:CHD Risk                                                 Coronary Heart Disease Risk Table                                                                 Men       Women                                   1/2 Average Risk              3.4        3.3                                       Average Risk              5.0  4.4                                    2X Average Risk              9.6        7.1                                    3X Average Risk             23.4       11.0                          Use the calculated Patient Ratio above and the CHD Risk table                           to determine the patient's CHD Risk.                          ATP III Classification (LDL):                                < 100        mg/dL         Optimal                               100 - 129     mg/dL         Near or Above Optimal                               130 - 159     mg/dL         Borderline High                               160 - 189     mg/dL         High                                > 190        mg/dL         Very High                             . Hemoglobin A1C 08/13/2013 8.2* <5.7 % Final   Comment:                                                                                                 According to the  ADA Clinical Practice Recommendations for 2011, when                          HbA1c is used as a screening test:                                                       >=6.5%   Diagnostic of Diabetes Mellitus                                     (if abnormal result is confirmed)                                                     5.7-6.4%   Increased risk of developing Diabetes Mellitus                                                     References:Diagnosis and Classification of Diabetes  Mell[place],Diabetes                          YBOF,7510,25(ENIDP 1):S62-S69 and Standards of Medical Care in                                  Diabetes - 2011,Diabetes OEUM,3536,14 (Suppl 1):S11-S61.                             . Mean Plasma Glucose 08/13/2013 189* <117 mg/dL Final      Medication List       This list is accurate as of: 08/15/13 10:11 AM.  Always use your most recent med list.               allopurinol 100 MG tablet  Commonly known as:  ZYLOPRIM  Take 1 tablet (100 mg total) by mouth daily.     atorvastatin 40 MG tablet  Commonly known as:  LIPITOR  Take 1 tablet (40 mg total) by mouth daily.     BAYER MICROLET LANCETS lancets  Use as instructed to check blood sugars 2 times per day     Bromocriptine Mesylate 0.8 MG Tabs  Commonly known as:  CYCLOSET  Take 6 tablets once a day     fenofibrate micronized 43 MG capsule  Commonly known as:  ANTARA     glipiZIDE 10 MG 24 hr tablet  Commonly known as:  GLUCOTROL XL  Take 1 tablet (10 mg total) by mouth daily with breakfast.     glucose blood test strip  Commonly known as:  BAYER CONTOUR TEST  Use as instructed to check blood sugars 2 times per day dx code 250.02     metFORMIN 1000 MG tablet  Commonly known as:  GLUCOPHAGE  Take 1 tablet (1,000 mg total) by mouth 2 (two)  times daily with a meal.     miglitol 50 MG tablet  Commonly known as:  GLYSET  Take 1 tablet (50 mg total) by mouth 3 (three) times daily with meals.     nebivolol 10 MG tablet  Commonly known as:  BYSTOLIC  Takes 1/2 tablet daily     sitaGLIPtin 100 MG tablet  Commonly known as:  JANUVIA  Take 1 tablet (100 mg total) by mouth daily.     tamsulosin 0.4 MG Caps capsule  Commonly known as:  FLOMAX  Take 1 capsule (0.4 mg total) by mouth daily.        Allergies:  Allergies  Allergen Reactions  . Penicillins   . Plavix [Clopidogrel Bisulfate] Hives    Past Medical History  Diagnosis Date  . Essential hypertension, benign    . Other and unspecified hyperlipidemia   . Chronic kidney disease, stage II (mild)   . Type II or unspecified type diabetes mellitus without mention of complication, uncontrolled   . PAD (peripheral artery disease)   . Orthostasis   . CAD (coronary artery disease)     with IMI and BMS 1997  . Gout     Past Surgical History  Procedure Laterality Date  . Hernia repair    . Cholecystectomy    . Cataract extraction    . Melanoma excision    . Skin lesion excision      Family History  Problem Relation Age of Onset  . Colon cancer Father   . Heart disease Mother     Social History:  reports that he has been smoking.  He has never used smokeless tobacco. His alcohol and drug histories are not on file.  Review of Systems:  Hypertension:  currently controlled only on Bystolic 5mg , also followed by cardiologist  Lipids: Well controlled with atorvastatin. He has not taken his fenofibrate recently  Lab Results  Component Value Date   LDLCALC 46 08/13/2013    History of BPH   Examination:   BP 110/60  Pulse 78  Temp(Src) 98.1 F (36.7 C)  Resp 16  Ht 6\' 2"  (1.88 m)  Wt 175 lb 9.6 oz (79.652 kg)  BMI 22.54 kg/m2  SpO2 96%  Body mass index is 22.54 kg/(m^2).   Foot exam done, has callus formation and mild sensory loss as well as decreased pulses  ASSESSMENT/ PLAN:   Diabetes type 2   Blood glucose control is relatively better with adding Cycloset Since he only started this about 5 weeks ago and is titrating gradually it has not shown any reduction in his A1c has yet Also he is not consistent with diet with occasional readings around 200 or more Discussed again the problem of not checking readings after meals especially after evening meal He has been fairly compliant with his 5 drug regimen and is comfortable continuing all his medications which are well tolerated No hypoglycemia with glipizide at this time  Discussed above problems and following recommendations were  made  Check at least half of the blood sugars after meals including after supper  Continued increased exercise  On his blood sugars are consistently high after meals or getting below 80 between meals  Moderate amounts of carbohydrate and fat in meals especially when eating out  Take Glyset consistently right before eating  Neuropathy: He will discuss a referral to podiatrist with PCP, probably would benefit from diabetic shoes Given names of ophthalmologists for eye exams  HYPERTENSION: Blood pressure appears low normal with  Bystolic 5 mg and he can discuss this with cardiologist tomorrow  HYPERLIPIDEMIA: His LDL is well-controlled and he needed to continue high-dose statin because of history of CAD and diabetes Since his triglycerides are not over 200 will not restart fenofibrate  History of renal insufficiency: Appears resolved, not clear if fenofibrate was playing a role  Counseling time over 50% of today's 25 minute visit  Mario [name] 08/15/2013, 10:11 AM

## 2013-08-15 NOTE — Patient Instructions (Signed)
Please check blood sugars at least half the time about 2 hours after any meal and as directed on waking up.  Please bring blood sugar monitor to each visit  Call if sugar below 80, need to reduce Glipizide to 5 then

## 2013-08-16 ENCOUNTER — Encounter: Payer: Self-pay | Admitting: Interventional Cardiology

## 2013-08-16 ENCOUNTER — Ambulatory Visit (INDEPENDENT_AMBULATORY_CARE_PROVIDER_SITE_OTHER): Payer: Commercial Managed Care - HMO | Admitting: Interventional Cardiology

## 2013-08-16 VITALS — BP 128/64 | HR 67 | Ht 74.0 in | Wt 172.0 lb

## 2013-08-16 DIAGNOSIS — I10 Essential (primary) hypertension: Secondary | ICD-10-CM

## 2013-08-16 DIAGNOSIS — I739 Peripheral vascular disease, unspecified: Secondary | ICD-10-CM

## 2013-08-16 DIAGNOSIS — N182 Chronic kidney disease, stage 2 (mild): Secondary | ICD-10-CM

## 2013-08-16 DIAGNOSIS — IMO0001 Reserved for inherently not codable concepts without codable children: Secondary | ICD-10-CM

## 2013-08-16 DIAGNOSIS — I2510 Atherosclerotic heart disease of native coronary artery without angina pectoris: Secondary | ICD-10-CM

## 2013-08-16 DIAGNOSIS — E1165 Type 2 diabetes mellitus with hyperglycemia: Secondary | ICD-10-CM

## 2013-08-16 NOTE — Patient Instructions (Addendum)
Your physician recommends that you continue on your current medications as directed. Please refer to the Current Medication list given to you today.  Your physician has requested that you have a lower extremity arterial  duplex. During this test,  ultrasound are used to evaluate arterial blood flow in the legs. Allow one hour for this exam. There are no restrictions or special instructions.    Your physician wants you to follow-up in: 1 year You will receive a reminder letter in the mail two months in advance. If you don't receive a letter, please call our office to schedule the follow-up appointment.

## 2013-08-16 NOTE — Progress Notes (Signed)
Patient ID: Mario [name], male   DOB: 1940/08/22, 73 y.o.   MRN: [id number]    [address], [place]  [address] Phone: [phone] Fax:  [phone]  Date:  08/16/2013   ID:  Janace Litten, DOB [birth year], MRN [id number]  PCP:  Kandice Hams, MD   ASSESSMENT:  1. Increasing difficulty with right greater than left claudication, no history of peripheral arterial disease  2. Coronary artery disease, asymptomatic 3. Chronic kidney disease 4. Diabetes mellitus  PLAN:  1. Lower extremity arterial Doppler study to evaluate progression of PAD symptoms/claudication 2. No change in therapy otherwise for CAD and hyperlipidemia 3. Local followup in one year   SUBJECTIVE: Mario [name] is a 73 y.o. male who is doing well without cardiac complaints. He does note increasing lower extremity weakness and discomfort while playing tennis. He is a known history of PAD. Been no neurological complaints. He has less than 60% stenosis in the carotids bilaterally obtained by a Doppler study done within the past year. This will need to be repeated again next year. He has not had angina, dyspnea, or syncope.   Wt Readings from Last 3 Encounters:  08/16/13 172 lb (78.019 kg)  08/15/13 175 lb 9.6 oz (79.652 kg)  06/13/13 178 lb (80.74 kg)     Past Medical History  Diagnosis Date  . Essential hypertension, benign   . Other and unspecified hyperlipidemia   . Chronic kidney disease, stage II (mild)   . Type II or unspecified type diabetes mellitus without mention of complication, uncontrolled   . PAD (peripheral artery disease)   . Orthostasis   . CAD (coronary artery disease)     with IMI and BMS 1997  . Gout     Current Outpatient Prescriptions  Medication Sig Dispense Refill  . allopurinol (ZYLOPRIM) 100 MG tablet Take 1 tablet (100 mg total) by mouth daily.  90 tablet  1  . atorvastatin (LIPITOR) 40 MG tablet Take 1 tablet (40 mg total) by mouth daily.  90 tablet  1    . BAYER MICROLET LANCETS lancets Use as instructed to check blood sugars 2 times per day  100 each  12  . Bromocriptine Mesylate (CYCLOSET) 0.8 MG TABS Take 6 tablets once a day  180 tablet  5  . glipiZIDE (GLUCOTROL XL) 10 MG 24 hr tablet Take 1 tablet (10 mg total) by mouth daily with breakfast.  90 tablet  1  . glucose blood (BAYER CONTOUR TEST) test strip Use as instructed to check blood sugars 2 times per day dx code 250.02  100 each  12  . metFORMIN (GLUCOPHAGE) 1000 MG tablet Take 1 tablet (1,000 mg total) by mouth 2 (two) times daily with a meal.  180 tablet  3  . miglitol (GLYSET) 50 MG tablet Take 1 tablet (50 mg total) by mouth 3 (three) times daily with meals.  90 tablet  5  . nebivolol (BYSTOLIC) 10 MG tablet Takes 1/2 tablet daily  30 tablet  5  . sitaGLIPtin (JANUVIA) 100 MG tablet Take 1 tablet (100 mg total) by mouth daily.  30 tablet  5  . tamsulosin (FLOMAX) 0.4 MG CAPS capsule Take 1 capsule (0.4 mg total) by mouth daily.  30 capsule  0   No current facility-administered medications for this visit.    Allergies:    Allergies  Allergen Reactions  . Penicillins   . Plavix [Clopidogrel Bisulfate] Hives    Social History:  The patient  reports that he has been smoking.  He has never used smokeless tobacco.   ROS:  Please see the history of present illness.   No sores on his feet. He denies orthopnea. No transient neurological symptoms. Appetite is somewhat waning. Weight is been stable.   All other systems reviewed and negative.   OBJECTIVE: VS:  BP 128/64  Pulse 67  Ht 6\' 2"  (1.88 m)  Wt 172 lb (78.019 kg)  BMI 22.07 kg/m2 Well nourished, well developed, in no acute distress, slender, appears his stated age. HEENT: normal Neck: JVD flat. Carotid bruit absent  Cardiac:  normal S1, S2; RRR; no murmur Lungs:  clear to auscultation bilaterally, no wheezing, rhonchi or rales Abd: soft, nontender, no hepatomegaly Ext: Edema absent. Pulses absent to trace  bilateral Skin: warm and dry Neuro:  CNs 2-12 intact, no focal abnormalities noted  EKG:  Normal sinus rhythm with first-degree AV block       Signed, Illene Labrador III, MD 08/16/2013 9:46 AM

## 2013-08-19 ENCOUNTER — Other Ambulatory Visit: Payer: Self-pay | Admitting: *Deleted

## 2013-08-19 ENCOUNTER — Other Ambulatory Visit (HOSPITAL_COMMUNITY): Payer: Self-pay | Admitting: Cardiology

## 2013-08-19 DIAGNOSIS — I70219 Atherosclerosis of native arteries of extremities with intermittent claudication, unspecified extremity: Secondary | ICD-10-CM

## 2013-08-19 MED ORDER — ACCU-CHEK FASTCLIX LANCETS MISC: Status: DC

## 2013-08-19 MED ORDER — ACCU-CHEK NANO SMARTVIEW W/DEVICE KIT: PACK | Status: DC

## 2013-08-19 MED ORDER — GLUCOSE BLOOD VI STRP: ORAL_STRIP | Status: DC

## 2013-08-19 MED ORDER — BD SWAB SINGLE USE REGULAR PADS: MEDICATED_PAD | Status: DC

## 2013-08-19 MED ORDER — ACCU-CHEK SMARTVIEW CONTROL VI LIQD: Status: DC

## 2013-08-22 ENCOUNTER — Ambulatory Visit (HOSPITAL_COMMUNITY): Payer: Medicare HMO | Attending: Cardiovascular Disease | Admitting: Cardiology

## 2013-08-22 ENCOUNTER — Encounter: Payer: Self-pay | Admitting: Cardiovascular Disease

## 2013-08-22 DIAGNOSIS — I739 Peripheral vascular disease, unspecified: Secondary | ICD-10-CM

## 2013-08-22 DIAGNOSIS — I70219 Atherosclerosis of native arteries of extremities with intermittent claudication, unspecified extremity: Secondary | ICD-10-CM | POA: Insufficient documentation

## 2013-08-22 NOTE — Progress Notes (Signed)
Lower arterial doppler and duplex complete

## 2013-08-26 ENCOUNTER — Telehealth: Payer: Self-pay

## 2013-08-26 NOTE — Telephone Encounter (Signed)
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 Needs consult with Dr. Fletcher Anon or Gwenlyn Found ------

## 2013-08-26 NOTE — Telephone Encounter (Signed)
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 Needs consult with Dr. Fletcher Anon.pt  adv that a scheduler will call him to schedule a consult with Dr.Arida.pt agreeable with plan and verbalized understanding.

## 2013-09-09 ENCOUNTER — Encounter: Payer: Self-pay | Admitting: Podiatry

## 2013-09-09 ENCOUNTER — Ambulatory Visit (INDEPENDENT_AMBULATORY_CARE_PROVIDER_SITE_OTHER): Payer: Commercial Managed Care - HMO | Admitting: Podiatry

## 2013-09-09 VITALS — BP 105/70 | HR 66 | Resp 18

## 2013-09-09 DIAGNOSIS — E1149 Type 2 diabetes mellitus with other diabetic neurological complication: Secondary | ICD-10-CM

## 2013-09-09 DIAGNOSIS — E1159 Type 2 diabetes mellitus with other circulatory complications: Secondary | ICD-10-CM

## 2013-09-09 DIAGNOSIS — M2040 Other hammer toe(s) (acquired), unspecified foot: Secondary | ICD-10-CM

## 2013-09-09 NOTE — Progress Notes (Signed)
   Subjective:    Patient ID: Mario [name], male    DOB: [birth year], 73 y.o.   MRN: [id number]  HPI I have been a diabetic for about 25 years and I have some neuropathy and numbness and tingling some and I want him to check my feet.  The patient states that his last podiatry visit was in [place] approximately a year and a half ago. He also is under the evaluation of a vascular surgeon after having a vascular examination and he has a scheduled visit to followup on this peripheral arterial disease.    Review of Systems  HENT: Positive for hearing loss.   Cardiovascular:       Calf pain with walking  Musculoskeletal:       Muscle pain  All other systems reviewed and are negative.      Objective:   Physical Exam Orientated x80 space 73 year old white male  Vascular: Pes are 1/4 bilaterally. PTs are 0/4 bilaterally.  Neurological: Sensation to 10 g monofilament wire intact one 4/5 bilaterally. Vibratory sensation nonreactive bilaterally. Ankle reflexes reactive bilaterally.  Dermatological: Dry scaling skin noted bilaterally. Diffuse plantar keratoses plantar fifth right MPJ and plantar heel bilaterally. The toenails x10 are mildly discolored and incurvated.  Musculoskeletal: Patient has a medium/high longitudinal arch bilaterally, with mild hammering of fifth digits bilaterally.       Assessment & Plan:   Assessment: Loss of protective sensation bilaterally Diabetic peripheral neuropathy bilaterally Peripheral arterial disease bilaterally under evaluation of vascular surgeon. 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 A provided General Information about diabetic footcare today.  Reappoint at yearly intervals or sooner if patient has concern.

## 2013-09-09 NOTE — Patient Instructions (Signed)
Patient has a pending appointment on 10/01/2013 to evaluate your circulation in the lower extremities . Diabetes and Foot Care Diabetes may cause you to have problems because of poor blood supply (circulation) to your feet and legs. This may cause the skin on your feet to become thinner, break easier, and heal more slowly. Your skin may become dry, and the skin may peel and crack. You may also have nerve damage in your legs and feet causing decreased feeling in them. You may not notice minor injuries to your feet that could lead to infections or more serious problems. Taking care of your feet is one of the most important things you can do for yourself.  HOME CARE INSTRUCTIONS  Wear shoes at all times, even in the house. Do not go barefoot. Bare feet are easily injured.  Check your feet daily for blisters, cuts, and redness. If you cannot see the bottom of your feet, use a mirror or ask someone for help.  Wash your feet with warm water (do not use hot water) and mild soap. Then pat your feet and the areas between your toes until they are completely dry. Do not soak your feet as this can dry your skin.  Apply a moisturizing lotion or petroleum jelly (that does not contain alcohol and is unscented) to the skin on your feet and to dry, brittle toenails. Do not apply lotion between your toes.  Trim your toenails straight across. Do not dig under them or around the cuticle. File the edges of your nails with an emery board or nail file.  Do not cut corns or calluses or try to remove them with medicine.  Wear clean socks or stockings every day. Make sure they are not too tight. Do not wear knee-high stockings since they may decrease blood flow to your legs.  Wear shoes that fit properly and have enough cushioning. To break in new shoes, wear them for just a few hours a day. This prevents you from injuring your feet. Always look in your shoes before you put them on to be sure there are no objects  inside.  Do not cross your legs. This may decrease the blood flow to your feet.  If you find a minor scrape, cut, or break in the skin on your feet, keep it and the skin around it clean and dry. These areas may be cleansed with mild soap and water. Do not cleanse the area with peroxide, alcohol, or iodine.  When you remove an adhesive bandage, be sure not to damage the skin around it.  If you have a wound, look at it several times a day to make sure it is healing.  Do not use heating pads or hot water bottles. They may burn your skin. If you have lost feeling in your feet or legs, you may not know it is happening until it is too late.  Make sure your health care provider performs a complete foot exam at least annually or more often if you have foot problems. Report any cuts, sores, or bruises to your health care provider immediately. SEEK MEDICAL CARE IF:   You have an injury that is not healing.  You have cuts or breaks in the skin.  You have an ingrown nail.  You notice redness on your legs or feet.  You feel burning or tingling in your legs or feet.  You have pain or cramps in your legs and feet.  Your legs or feet are numb.  Your feet always feel cold. SEEK IMMEDIATE MEDICAL CARE IF:   There is increasing redness, swelling, or pain in or around a wound.  There is a red line that goes up your leg.  Pus is coming from a wound.  You develop a fever or as directed by your health care provider.  You notice a bad smell coming from an ulcer or wound. Document Released: 05/06/2000 Document Revised: 01/09/2013 Document Reviewed: 10/16/2012 [facility] Patient Information 2014 [place].

## 2013-09-10 ENCOUNTER — Encounter: Payer: Self-pay | Admitting: Podiatry

## 2013-09-16 ENCOUNTER — Encounter (INDEPENDENT_AMBULATORY_CARE_PROVIDER_SITE_OTHER): Payer: Commercial Managed Care - HMO | Admitting: Ophthalmology

## 2013-09-16 DIAGNOSIS — H3530 Unspecified macular degeneration: Secondary | ICD-10-CM

## 2013-09-16 DIAGNOSIS — H33309 Unspecified retinal break, unspecified eye: Secondary | ICD-10-CM

## 2013-09-16 DIAGNOSIS — H43819 Vitreous degeneration, unspecified eye: Secondary | ICD-10-CM

## 2013-09-16 DIAGNOSIS — H35039 Hypertensive retinopathy, unspecified eye: Secondary | ICD-10-CM

## 2013-09-16 DIAGNOSIS — E1165 Type 2 diabetes mellitus with hyperglycemia: Secondary | ICD-10-CM

## 2013-09-16 DIAGNOSIS — E11319 Type 2 diabetes mellitus with unspecified diabetic retinopathy without macular edema: Secondary | ICD-10-CM

## 2013-09-16 DIAGNOSIS — I10 Essential (primary) hypertension: Secondary | ICD-10-CM

## 2013-09-16 DIAGNOSIS — E1139 Type 2 diabetes mellitus with other diabetic ophthalmic complication: Secondary | ICD-10-CM

## 2013-09-16 LAB — HM DIABETES EYE EXAM

## 2013-09-24 ENCOUNTER — Encounter: Payer: Self-pay | Admitting: *Deleted

## 2013-09-24 ENCOUNTER — Institutional Professional Consult (permissible substitution): Payer: Medicare HMO | Admitting: Cardiovascular Disease

## 2013-09-30 ENCOUNTER — Other Ambulatory Visit: Payer: Self-pay | Admitting: *Deleted

## 2013-09-30 MED ORDER — BROMOCRIPTINE MESYLATE 0.8 MG PO TABS: ORAL_TABLET | ORAL | Status: DC

## 2013-10-01 ENCOUNTER — Ambulatory Visit (INDEPENDENT_AMBULATORY_CARE_PROVIDER_SITE_OTHER): Payer: Commercial Managed Care - HMO | Admitting: Cardiovascular Disease

## 2013-10-01 ENCOUNTER — Encounter: Payer: Self-pay | Admitting: Cardiovascular Disease

## 2013-10-01 VITALS — BP 120/66 | HR 68 | Ht 74.0 in | Wt 171.4 lb

## 2013-10-01 DIAGNOSIS — I2510 Atherosclerotic heart disease of native coronary artery without angina pectoris: Secondary | ICD-10-CM

## 2013-10-01 DIAGNOSIS — I10 Essential (primary) hypertension: Secondary | ICD-10-CM

## 2013-10-01 DIAGNOSIS — I739 Peripheral vascular disease, unspecified: Secondary | ICD-10-CM

## 2013-10-01 LAB — CBC WITH DIFFERENTIAL/PLATELET
Basophils Absolute: 0 10*3/uL (ref 0.0–0.1)
Basophils Relative: 0.4 % (ref 0.0–3.0)
Eosinophils Absolute: 0.1 10*3/uL (ref 0.0–0.7)
Eosinophils Relative: 1.3 % (ref 0.0–5.0)
HCT: 33.6 % — ABNORMAL LOW (ref 39.0–52.0)
Hemoglobin: 11.3 g/dL — ABNORMAL LOW (ref 13.0–17.0)
Lymphocytes Relative: 20.6 % (ref 12.0–46.0)
Lymphs Abs: 1.4 10*3/uL (ref 0.7–4.0)
MCHC: 33.6 g/dL (ref 30.0–36.0)
MCV: 88.5 fl (ref 78.0–100.0)
Monocytes Absolute: 0.5 10*3/uL (ref 0.1–1.0)
Monocytes Relative: 6.5 % (ref 3.0–12.0)
Neutro Abs: 4.9 10*3/uL (ref 1.4–7.7)
Neutrophils Relative %: 71.2 % (ref 43.0–77.0)
Platelets: 148 10*3/uL — ABNORMAL LOW (ref 150.0–400.0)
RBC: 3.8 Mil/uL — ABNORMAL LOW (ref 4.22–5.81)
RDW: 14 % (ref 11.5–15.5)
WBC: 6.9 10*3/uL (ref 4.0–10.5)

## 2013-10-01 LAB — BASIC METABOLIC PANEL
BUN: 20 mg/dL (ref 6–23)
CO2: 23 mEq/L (ref 19–32)
Calcium: 9 mg/dL (ref 8.4–10.5)
Chloride: 107 mEq/L (ref 96–112)
Creatinine, Ser: 1.2 mg/dL (ref 0.4–1.5)
GFR: 63.78 mL/min (ref >60.00)
Glucose, Bld: 226 mg/dL — ABNORMAL HIGH (ref 70–99)
Potassium: 4.3 mEq/L (ref 3.5–5.1)
Sodium: 137 mEq/L (ref 135–145)

## 2013-10-01 LAB — PROTIME-INR
INR: 1 ratio (ref 0.8–1.0)
Prothrombin Time: 11.1 s (ref 9.6–13.1)

## 2013-10-01 MED ORDER — ASPIRIN EC 81 MG PO TBEC: 81.0000 mg | DELAYED_RELEASE_TABLET | Freq: Every day | ORAL | Status: DC

## 2013-10-01 NOTE — Patient Instructions (Addendum)
Your physician has requested that you have a peripheral vascular angiogram. This exam is performed at the hospital. During this exam IV contrast is used to look at arterial blood flow. Please review the information sheet given for details.  Your physician has recommended you make the following change in your medication: START Aspirin 81mg  take one by mouth daily  Your physician recommends that you have lab work today: BMP, CBC, PT/INR

## 2013-10-01 NOTE — Progress Notes (Signed)
Primary care physician: Dr. Polite Primary cardiologist: Dr. Smith  HPI  This is a pleasant 73-year-old male who was referred for evaluation and management of peripheral arterial disease. He has known history of coronary artery disease with previous angioplasty and stent placement in 1997. He has prolonged history of type 2 diabetes for at least 25 years with mild chronic kidney disease. He has no history of heart failure. He reports prolonged history of bilateral calf pain with walking over the years which has worsened gradually and affect in his ability to perform activities of daily living and exercise. The calf pain is slightly worse on the left side and usually happens after he walks about 150. This causes him to stop for about 5 minutes to rest before he can resume. He used to play golf regularly but stopped doing that. He does play tennis but he is usually limited with claudication. He scratched his right foot about 2 weeks ago with a small ulceration which has been nonhealing so for. This is superficial. He is not a smoker. He is a retired lawyer. Recent noninvasive evaluation showed evidence of distal right SFA occlusion into the popliteal. There was significant stenosis involving the proximal and distal left SFA. ABI was moderately reduced on the right side and not obtainable on the left side due to noncompressible vessels.  Allergies  Allergen Reactions  . Penicillins   . Plavix [Clopidogrel Bisulfate] Hives     Current Outpatient Prescriptions on File Prior to Visit  Medication Sig Dispense Refill  . ACCU-CHEK FASTCLIX LANCETS MISC Use to check blood sugar 2 times per day dx code 250.02  200 each  1  . Alcohol Swabs (B-D SINGLE USE SWABS REGULAR) PADS Use 4 swabs per day  220 each  1  . allopurinol (ZYLOPRIM) 100 MG tablet Take 1 tablet (100 mg total) by mouth daily.  90 tablet  1  . atorvastatin (LIPITOR) 40 MG tablet Take 1 tablet (40 mg total) by mouth daily.  90 tablet  1  .  Blood Glucose Calibration (ACCU-CHEK SMARTVIEW CONTROL) LIQD Use as directed  1 each  1  . Blood Glucose Monitoring Suppl (ACCU-CHEK NANO SMARTVIEW) W/DEVICE KIT Use to check blood sugars 2 times per day dx code 250.02  1 kit  1  . Bromocriptine Mesylate (CYCLOSET) 0.8 MG TABS Take 6 tablets once a day  540 tablet  1  . glipiZIDE (GLUCOTROL XL) 10 MG 24 hr tablet Take 1 tablet (10 mg total) by mouth daily with breakfast.  90 tablet  1  . glucose blood (ACCU-CHEK SMARTVIEW) test strip Use as instructed to check blood sugar 2 times per day dx code 250.02  200 each  1  . metFORMIN (GLUCOPHAGE) 1000 MG tablet Take 1 tablet (1,000 mg total) by mouth 2 (two) times daily with a meal.  180 tablet  3  . miglitol (GLYSET) 50 MG tablet Take 1 tablet (50 mg total) by mouth 3 (three) times daily with meals.  90 tablet  5  . nebivolol (BYSTOLIC) 10 MG tablet Takes 1/2 tablet daily  30 tablet  5  . sitaGLIPtin (JANUVIA) 100 MG tablet Take 1 tablet (100 mg total) by mouth daily.  30 tablet  5  . tamsulosin (FLOMAX) 0.4 MG CAPS capsule Take 1 capsule (0.4 mg total) by mouth daily.  30 capsule  0   No current facility-administered medications on file prior to visit.     Past Medical History  Diagnosis Date  . Essential   hypertension, benign   . Other and unspecified hyperlipidemia   . Chronic kidney disease, stage II (mild)   . Type II or unspecified type diabetes mellitus without mention of complication, uncontrolled   . PAD (peripheral artery disease)   . Orthostasis   . CAD (coronary artery disease)     with IMI and BMS 1997  . Gout      Past Surgical History  Procedure Laterality Date  . Hernia repair    . Cholecystectomy    . Cataract extraction    . Melanoma excision    . Skin lesion excision       Family History  Problem Relation Age of Onset  . Colon cancer Father   . Heart disease Mother      History   Social History  . Marital Status: Married    Spouse Name: N/A    Number of  Children: N/A  . Years of Education: N/A   Occupational History  . Not on file.   Social History Main Topics  . Smoking status: Current Every Day Smoker  . Smokeless tobacco: Never Used  . Alcohol Use: Not on file  . Drug Use: Not on file  . Sexual Activity: Not on file   Other Topics Concern  . Not on file   Social History Narrative  . No narrative on file     ROS A 10 point review of system was performed. It is negative other than that mentioned in the history of present illness.   PHYSICAL EXAM   BP 120/66  Pulse 68  Ht 6' 2" (1.88 m)  Wt 171 lb 6.4 oz (77.747 kg)  BMI 22.00 kg/m2 Constitutional: He is oriented to person, place, and time. He appears well-developed and well-nourished. No distress.  HENT: No nasal discharge.  Head: Normocephalic and atraumatic.  Eyes: Pupils are equal and round.  No discharge. Neck: Normal range of motion. Neck supple. No JVD present. No thyromegaly present.  Cardiovascular: Normal rate, regular rhythm, normal heart sounds. Exam reveals no gallop and no friction rub. No murmur heard.  Pulmonary/Chest: Effort normal and breath sounds normal. No stridor. No respiratory distress. He has no wheezes. He has no rales. He exhibits no tenderness.  Abdominal: Soft. Bowel sounds are normal. He exhibits no distension. There is no tenderness. There is no rebound and no guarding.  Musculoskeletal: Normal range of motion. He exhibits no edema and no tenderness.  Neurological: He is alert and oriented to person, place, and time. Coordination normal.  Skin: Skin is warm and dry. No rash noted. He is not diaphoretic. No erythema. No pallor.  Psychiatric: He has a normal mood and affect. His behavior is normal. Judgment and thought content normal.  Vascular: Femoral pulses are normal. Distal pulses are not palpable. There is a small 1 cm ulceration on the medial side of the right foot. This is very superficial.       ASSESSMENT AND PLAN   

## 2013-10-01 NOTE — Assessment & Plan Note (Signed)
He has no symptoms of angina. 

## 2013-10-01 NOTE — Assessment & Plan Note (Signed)
The patient has severe bilateral calf claudication slightly worse on the left side due to bilateral SFA disease. There is a small ulceration on the medial aspect of right foot which has been slow to heal over the last 2 weeks. I discussed different management options with him and given severity of his symptoms and presence of ulceration, I recommend proceeding with abdominal aortogram, lower extremity runoff and possible endovascular intervention. Risks of the procedure were explained. Planned access is via the left common femoral artery. Continue treatment of risk factors as is being done.

## 2013-10-04 ENCOUNTER — Encounter (HOSPITAL_COMMUNITY): Payer: Self-pay | Admitting: Pharmacy Technician

## 2013-10-09 ENCOUNTER — Ambulatory Visit (HOSPITAL_COMMUNITY)
Admission: RE | Admit: 2013-10-09 | Discharge: 2013-10-09 | Disposition: A | Discharge status: Home / Self Care | Payer: Medicare HMO | Source: Ambulatory Visit | Attending: Cardiovascular Disease | Admitting: Cardiovascular Disease

## 2013-10-09 ENCOUNTER — Encounter (HOSPITAL_COMMUNITY)
Admission: RE | Disposition: A | Discharge status: Home / Self Care | Payer: Self-pay | Source: Ambulatory Visit | Attending: Cardiovascular Disease

## 2013-10-09 ENCOUNTER — Other Ambulatory Visit: Payer: Self-pay | Admitting: Cardiovascular Disease

## 2013-10-09 DIAGNOSIS — I739 Peripheral vascular disease, unspecified: Secondary | ICD-10-CM | POA: Insufficient documentation

## 2013-10-09 DIAGNOSIS — L98499 Non-pressure chronic ulcer of skin of other sites with unspecified severity: Principal | ICD-10-CM | POA: Insufficient documentation

## 2013-10-09 DIAGNOSIS — I708 Atherosclerosis of other arteries: Secondary | ICD-10-CM | POA: Insufficient documentation

## 2013-10-09 DIAGNOSIS — L97509 Non-pressure chronic ulcer of other part of unspecified foot with unspecified severity: Secondary | ICD-10-CM | POA: Insufficient documentation

## 2013-10-09 DIAGNOSIS — M109 Gout, unspecified: Secondary | ICD-10-CM | POA: Insufficient documentation

## 2013-10-09 DIAGNOSIS — I700 Atherosclerosis of aorta: Secondary | ICD-10-CM | POA: Insufficient documentation

## 2013-10-09 DIAGNOSIS — Z9861 Coronary angioplasty status: Secondary | ICD-10-CM | POA: Insufficient documentation

## 2013-10-09 DIAGNOSIS — I70219 Atherosclerosis of native arteries of extremities with intermittent claudication, unspecified extremity: Secondary | ICD-10-CM

## 2013-10-09 DIAGNOSIS — N182 Chronic kidney disease, stage 2 (mild): Secondary | ICD-10-CM | POA: Insufficient documentation

## 2013-10-09 DIAGNOSIS — I129 Hypertensive chronic kidney disease with stage 1 through stage 4 chronic kidney disease, or unspecified chronic kidney disease: Secondary | ICD-10-CM | POA: Insufficient documentation

## 2013-10-09 DIAGNOSIS — E119 Type 2 diabetes mellitus without complications: Secondary | ICD-10-CM | POA: Insufficient documentation

## 2013-10-09 DIAGNOSIS — K55059 Acute (reversible) ischemia of intestine, part and extent unspecified: Secondary | ICD-10-CM | POA: Insufficient documentation

## 2013-10-09 DIAGNOSIS — I701 Atherosclerosis of renal artery: Secondary | ICD-10-CM | POA: Insufficient documentation

## 2013-10-09 DIAGNOSIS — I2510 Atherosclerotic heart disease of native coronary artery without angina pectoris: Secondary | ICD-10-CM | POA: Insufficient documentation

## 2013-10-09 HISTORY — PX: ABDOMINAL AORTAGRAM: SHX5454

## 2013-10-09 LAB — GLUCOSE, CAPILLARY
Glucose-Capillary: 176 mg/dL — ABNORMAL HIGH (ref 70–99)
Glucose-Capillary: 144 mg/dL — ABNORMAL HIGH (ref 70–99)

## 2013-10-09 SURGERY — ABDOMINAL AORTAGRAM
Anesthesia: LOCAL

## 2013-10-09 MED ORDER — HEPARIN (PORCINE) IN NACL 2-0.9 UNIT/ML-% IJ SOLN
INTRAMUSCULAR | Status: AC
  Filled 2013-10-09: qty 1000

## 2013-10-09 MED ORDER — MIDAZOLAM HCL 2 MG/2ML IJ SOLN
INTRAMUSCULAR | Status: AC
  Filled 2013-10-09: qty 2

## 2013-10-09 MED ORDER — FENTANYL CITRATE 0.05 MG/ML IJ SOLN
INTRAMUSCULAR | Status: AC
  Filled 2013-10-09: qty 2

## 2013-10-09 MED ORDER — NITROGLYCERIN 0.2 MG/ML ON CALL CATH LAB
INTRAVENOUS | Status: AC
  Filled 2013-10-09: qty 1

## 2013-10-09 MED ORDER — SODIUM CHLORIDE 0.9 % IV SOLN: INTRAVENOUS | Status: AC

## 2013-10-09 MED ORDER — SODIUM CHLORIDE 0.9 % IV SOLN
INTRAVENOUS | Status: DC
  Administered 2013-10-09: 09:00:00 via INTRAVENOUS

## 2013-10-09 MED ORDER — CILOSTAZOL 50 MG PO TABS: 50.0000 mg | ORAL_TABLET | Freq: Two times a day (BID) | ORAL | Status: DC

## 2013-10-09 MED ORDER — LIDOCAINE HCL (PF) 1 % IJ SOLN
INTRAMUSCULAR | Status: AC
  Filled 2013-10-09: qty 30

## 2013-10-09 NOTE — Discharge Instructions (Signed)

## 2013-10-09 NOTE — Progress Notes (Signed)
Dr. Fletcher Anon paged per pt request to inquire if patient could be discharged once bedrest is over and not stay for additional IVF. Dr. Fletcher Anon agreed that pt can go home after bedrest as long as he drinks plenty of fluids once home. Pt informed.

## 2013-10-09 NOTE — Interval H&P Note (Signed)
History and Physical Interval Note:  10/09/2013 10:00 AM  Mario [name]  has presented today for surgery, with the diagnosis of PVD  The various methods of treatment have been discussed with the patient and family. After consideration of risks, benefits and other options for treatment, the patient has consented to  Procedure(s): ABDOMINAL AORTAGRAM (N/A) as a surgical intervention .  The patient's history has been reviewed, patient examined, no change in status, stable for surgery.  I have reviewed the patient's chart and labs.  Questions were answered to the patient's satisfaction.     Wellington Hampshire

## 2013-10-09 NOTE — H&P (View-Only) (Signed)
Primary care physician: Dr. Polite Primary cardiologist: Dr. Smith  HPI  This is a pleasant 73-year-old male who was referred for evaluation and management of peripheral arterial disease. He has known history of coronary artery disease with previous angioplasty and stent placement in 1997. He has prolonged history of type 2 diabetes for at least 25 years with mild chronic kidney disease. He has no history of heart failure. He reports prolonged history of bilateral calf pain with walking over the years which has worsened gradually and affect in his ability to perform activities of daily living and exercise. The calf pain is slightly worse on the left side and usually happens after he walks about 150. This causes him to stop for about 5 minutes to rest before he can resume. He used to play golf regularly but stopped doing that. He does play tennis but he is usually limited with claudication. He scratched his right foot about 2 weeks ago with a small ulceration which has been nonhealing so for. This is superficial. He is not a smoker. He is a retired lawyer. Recent noninvasive evaluation showed evidence of distal right SFA occlusion into the popliteal. There was significant stenosis involving the proximal and distal left SFA. ABI was moderately reduced on the right side and not obtainable on the left side due to noncompressible vessels.  Allergies  Allergen Reactions  . Penicillins   . Plavix [Clopidogrel Bisulfate] Hives     Current Outpatient Prescriptions on File Prior to Visit  Medication Sig Dispense Refill  . ACCU-CHEK FASTCLIX LANCETS MISC Use to check blood sugar 2 times per day dx code 250.02  200 each  1  . Alcohol Swabs (B-D SINGLE USE SWABS REGULAR) PADS Use 4 swabs per day  220 each  1  . allopurinol (ZYLOPRIM) 100 MG tablet Take 1 tablet (100 mg total) by mouth daily.  90 tablet  1  . atorvastatin (LIPITOR) 40 MG tablet Take 1 tablet (40 mg total) by mouth daily.  90 tablet  1  .  Blood Glucose Calibration (ACCU-CHEK SMARTVIEW CONTROL) LIQD Use as directed  1 each  1  . Blood Glucose Monitoring Suppl (ACCU-CHEK NANO SMARTVIEW) W/DEVICE KIT Use to check blood sugars 2 times per day dx code 250.02  1 kit  1  . Bromocriptine Mesylate (CYCLOSET) 0.8 MG TABS Take 6 tablets once a day  540 tablet  1  . glipiZIDE (GLUCOTROL XL) 10 MG 24 hr tablet Take 1 tablet (10 mg total) by mouth daily with breakfast.  90 tablet  1  . glucose blood (ACCU-CHEK SMARTVIEW) test strip Use as instructed to check blood sugar 2 times per day dx code 250.02  200 each  1  . metFORMIN (GLUCOPHAGE) 1000 MG tablet Take 1 tablet (1,000 mg total) by mouth 2 (two) times daily with a meal.  180 tablet  3  . miglitol (GLYSET) 50 MG tablet Take 1 tablet (50 mg total) by mouth 3 (three) times daily with meals.  90 tablet  5  . nebivolol (BYSTOLIC) 10 MG tablet Takes 1/2 tablet daily  30 tablet  5  . sitaGLIPtin (JANUVIA) 100 MG tablet Take 1 tablet (100 mg total) by mouth daily.  30 tablet  5  . tamsulosin (FLOMAX) 0.4 MG CAPS capsule Take 1 capsule (0.4 mg total) by mouth daily.  30 capsule  0   No current facility-administered medications on file prior to visit.     Past Medical History  Diagnosis Date  . Essential   hypertension, benign   . Other and unspecified hyperlipidemia   . Chronic kidney disease, stage II (mild)   . Type II or unspecified type diabetes mellitus without mention of complication, uncontrolled   . PAD (peripheral artery disease)   . Orthostasis   . CAD (coronary artery disease)     with IMI and BMS 1997  . Gout      Past Surgical History  Procedure Laterality Date  . Hernia repair    . Cholecystectomy    . Cataract extraction    . Melanoma excision    . Skin lesion excision       Family History  Problem Relation Age of Onset  . Colon cancer Father   . Heart disease Mother      History   Social History  . Marital Status: Married    Spouse Name: N/A    Number of  Children: N/A  . Years of Education: N/A   Occupational History  . Not on file.   Social History Main Topics  . Smoking status: Current Every Day Smoker  . Smokeless tobacco: Never Used  . Alcohol Use: Not on file  . Drug Use: Not on file  . Sexual Activity: Not on file   Other Topics Concern  . Not on file   Social History Narrative  . No narrative on file     ROS A 10 point review of system was performed. It is negative other than that mentioned in the history of present illness.   PHYSICAL EXAM   BP 120/66  Pulse 68  Ht 6' 2" (1.88 m)  Wt 171 lb 6.4 oz (77.747 kg)  BMI 22.00 kg/m2 Constitutional: He is oriented to person, place, and time. He appears well-developed and well-nourished. No distress.  HENT: No nasal discharge.  Head: Normocephalic and atraumatic.  Eyes: Pupils are equal and round.  No discharge. Neck: Normal range of motion. Neck supple. No JVD present. No thyromegaly present.  Cardiovascular: Normal rate, regular rhythm, normal heart sounds. Exam reveals no gallop and no friction rub. No murmur heard.  Pulmonary/Chest: Effort normal and breath sounds normal. No stridor. No respiratory distress. He has no wheezes. He has no rales. He exhibits no tenderness.  Abdominal: Soft. Bowel sounds are normal. He exhibits no distension. There is no tenderness. There is no rebound and no guarding.  Musculoskeletal: Normal range of motion. He exhibits no edema and no tenderness.  Neurological: He is alert and oriented to person, place, and time. Coordination normal.  Skin: Skin is warm and dry. No rash noted. He is not diaphoretic. No erythema. No pallor.  Psychiatric: He has a normal mood and affect. His behavior is normal. Judgment and thought content normal.  Vascular: Femoral pulses are normal. Distal pulses are not palpable. There is a small 1 cm ulceration on the medial side of the right foot. This is very superficial.       ASSESSMENT AND PLAN

## 2013-10-09 NOTE — CV Procedure (Signed)
PERIPHERAL VASCULAR PROCEDURE  NAME:  Mario [name]   MRN: [id number] DOB:  Oct 23, 1940   ADMIT DATE: 10/09/2013  Performing Cardiologist: Wellington Hampshire Primary Physician: Kandice Hams, MD Primary Cardiologist:  [name] MD  Procedures Performed:  Abdominal Aortic Angiogram with Bi-Iliofemoral Runoff  Selective left lower extremity arterial angiography  Selective right lower extremity arterial angiography    Indication(s):   Claudication    Consent: The procedure with Risks/Benefits/Alternatives and Indications was reviewed with the patient .  All questions were answered.  Medications:  Sedation:  1 mg IV Versed, 50 mcg IV Fentanyl  Contrast:  117 mL  Visipaque   Procedural details: The right groin was prepped, draped, and anesthetized with 1% lidocaine. Using modified Seldinger technique, a 5 French sheath was introduced into the right common femoral artery. A 5 Fr Short Pigtail Catheter was advanced of over a  Versicore wire into the descending Aorta to a level just above the renal arteries. A power injection of 43ml/sec contrast over 1 sec was performed for Abdominal Aortic Angiography.  The catheter was then pulled back to a level just above the Aortic bifurcation, and a second power injection was performed to evaluate the iliac arteries.    The pigtail catheter was changed over the Versicore wire for A crossover catheter which was then pulled back the aortic bifurcation and the glide wire was advanced down the contralateral common iliac artery.  The wire was then advanced to the contralateral common femoral artery, the catheter was exchanged into an end hole straight tip catheter which was advanced over the wire to the common femoral artery. Contralateral second-order lower extremity angiography was performed via power injection of 5 ml / sec contrast for a total of 35 ml. A gradient pullback was performed across the lesion in the left common iliac artery after  giving 200 mcg of nitroglycerin. The peak to peak gradient was only between 10-15 mmHg.  The catheter was then pulled back over the way inside the sheath.  ipsilateral artery angiography was performed via power injection of 5  ml/sec contrast for a total of 35 ml.      Hemodynamics:  Central Aortic Pressure / Mean Aortic Pressure: 168/75  Findings:  Abdominal aorta: Heavily calcified with no evidence of significant stenosis or aneurysm.  Left renal artery: 20% ostial stenosis.  Right renal artery: 40% calcified ostial stenosis.  Celiac artery: Not well visualized.  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  Right internal iliac artery: Appears to be significantly diseased at the ostium.  Right external iliac artery: Significantly calcified with 20% disease.  Right common femoral artery: Heavily calcified with diffuse 30% disease  Right profunda femoral artery: Minor irregularities.  Right superficial femoral artery: Normal in size with heavy calcifications and diffuse 20% disease throughout its course. There is short occlusion distally reconstituting via collaterals in the proximal popliteal artery  Right popliteal artery: 80% mid stenosis. Below the knee segment is normal  Right tibial peroneal trunk: Normal with three-vessel runoff below the knee.   Left common iliac artery:  There is heavy calcification close to the ostium likely causing 50-60% stenosis. The gradient across this lesion was only 10-15 mmHg  Left internal iliac artery: Patent  Left external iliac artery: Minor irregularities with 20% calcified distal stenosis.  Left common femoral artery: Heavily calcified with is 70% stenosis just before the origin  of the profunda  Left profunda femoral artery: Normal in size with 60% calcified ostial stenosis.  Left superficial femoral  artery:  Heavily calcified 90% ostial stenosis. Diffuse 20-30% disease throughout its course with 60% stenosis distally  Left popliteal artery: Diffuse 80-90% calcified disease  Left tibial peroneal trunk: Normal with three-vessel runoff below the knee.   Conclusions: 1. Heavily calcified aorta with no evidence of aneurysm of obstructive disease. 2. 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 3. Borderline significant left common iliac artery calcified stenosis. The peak to peak gradient was only between 10-15 mmHg. 4. Heavily calcified and diseased left common femoral artery extending into the ostium of the profunda and SFA. 5. Severe left popliteal arteries stenosis 6. Occluded distal right SFA into proximal popliteal artery. 7. Three-vessel runoff below the knee bilaterally.  Recommendations:  Recommend an attempt at medical therapy and a walking program. I started the patient on Pletal. if he fails medical therapy, revascularization options include left common femoral artery endarterectomy and possible femoropopliteal bypass on the right. I discussed the case with Dr. Donnetta Hutching.   Wellington Hampshire, MD, [facility] 10/09/2013 10:51 AM

## 2013-10-11 ENCOUNTER — Telehealth: Payer: Self-pay | Admitting: Cardiovascular Disease

## 2013-10-11 NOTE — Telephone Encounter (Signed)
Dr. Fletcher Anon is off today so I reviewed with Dr. Tamala Julian who recommends pt stay off Cilostazol for now and to let pt know Dr. Fletcher Anon would review when back in office and we would contact him with the recommendations.  I spoke with pt's wife and gave her this information.

## 2013-10-11 NOTE — Telephone Encounter (Signed)
New message     Pt had a procedure on wed.  The medication --cilostazol 50mg --is making him "itchy" in his chest,back and arm area.  He only took 1 pill. Please advise

## 2013-10-11 NOTE — Telephone Encounter (Signed)
Spoke with pt who reports he took first dose of cilostazol yesterday AM. A few hours later he developed itching on arms, back and chest. No rash, no hives, no shortness of breath.  He has not taken any more doses.  Itching better today.  Will send to Dr. Fletcher Anon for his recommendations

## 2013-10-14 NOTE — Telephone Encounter (Signed)
Keep off Pletal for now.

## 2013-10-15 ENCOUNTER — Other Ambulatory Visit: Payer: Self-pay | Admitting: *Deleted

## 2013-10-15 MED ORDER — SITAGLIPTIN PHOSPHATE 100 MG PO TABS: 100.0000 mg | ORAL_TABLET | Freq: Every day | ORAL | Status: DC

## 2013-10-15 NOTE — Telephone Encounter (Signed)
Left message on machine for pt to contact the office.   

## 2013-10-17 NOTE — Telephone Encounter (Signed)
Pt aware to stay off of pletal.  I have added this medication to his list of allergies. The pt will keep upcoming appointment on 10/29/13.

## 2013-10-29 ENCOUNTER — Encounter: Payer: Self-pay | Admitting: Cardiovascular Disease

## 2013-10-29 ENCOUNTER — Ambulatory Visit (INDEPENDENT_AMBULATORY_CARE_PROVIDER_SITE_OTHER): Payer: Commercial Managed Care - HMO | Admitting: Cardiovascular Disease

## 2013-10-29 VITALS — BP 115/52 | HR 74 | Ht 74.0 in | Wt 171.4 lb

## 2013-10-29 DIAGNOSIS — I10 Essential (primary) hypertension: Secondary | ICD-10-CM

## 2013-10-29 DIAGNOSIS — I2510 Atherosclerotic heart disease of native coronary artery without angina pectoris: Secondary | ICD-10-CM

## 2013-10-29 DIAGNOSIS — E785 Hyperlipidemia, unspecified: Secondary | ICD-10-CM

## 2013-10-29 DIAGNOSIS — I739 Peripheral vascular disease, unspecified: Secondary | ICD-10-CM

## 2013-10-29 NOTE — Patient Instructions (Signed)
Your physician wants you to follow-up in: 6 MONTHS with Dr Fletcher Anon.  You will receive a reminder letter in the mail two months in advance. If you don't receive a letter, please call our office to schedule the follow-up appointment.  Your physician recommends that you continue on your current medications as directed. Please refer to the Current Medication list given to you today.

## 2013-10-29 NOTE — Progress Notes (Signed)
Primary care physician: Dr. [name] Primary cardiologist: Dr. Tamala Julian  HPI  This is a pleasant 73 year old male who was is here today for a follow up visit regarding peripheral arterial disease. He has known history of coronary artery disease with previous angioplasty and stent placement in 1997. He has prolonged history of type 2 diabetes for at least 25 years with mild chronic kidney disease. He has no history of heart failure. He reports prolonged history of bilateral calf pain with walking over the years which has worsened gradually and affect in his ability to perform activities of daily living and exercise. The calf pain is slightly worse on the left side and usually happens after he walks about 150. This causes him to stop for about 5 minutes to rest before he can resume. He used to play golf regularly but stopped doing that. He does play tennis but he is usually limited with claudication. He is not a smoker. 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 Noninvasive evaluation showed evidence of distal right SFA occlusion into the popliteal. There was significant stenosis involving the proximal and distal left SFA. ABI was moderately reduced on the right side and not obtainable on the left side due to noncompressible vessels. I proceeded with angiography which showed: 1. Heavily calcified aorta with no evidence of aneurysm or obstructive disease.  2. 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  3. Borderline significant left common iliac artery calcified stenosis. The peak to peak gradient was only between 10-15 mmHg.  4. Heavily calcified and diseased left common femoral artery extending into the ostium of the profunda and SFA.  5. Severe left popliteal arteries stenosis  6. Occluded distal right SFA into proximal popliteal artery.  7. Three-vessel runoff below the knee bilaterally.   He was started on Pletal but did not tolerate the medication due to itching.   Allergies  Allergen  Reactions  . Penicillins Anaphylaxis  . Plavix [Clopidogrel Bisulfate] Hives  . Pletal [Cilostazol] Itching     Current Outpatient Prescriptions on File Prior to Visit  Medication Sig Dispense Refill  . ACCU-CHEK FASTCLIX LANCETS MISC Use to check blood sugar 2 times per day dx code 250.02  200 each  1  . acetaminophen (TYLENOL) 500 MG tablet Take 1,000 mg by mouth daily.      . Alcohol Swabs (B-D SINGLE USE SWABS REGULAR) PADS Use 4 swabs per day  220 each  1  . allopurinol (ZYLOPRIM) 100 MG tablet Take 1 tablet (100 mg total) by mouth daily.  90 tablet  1  . aspirin EC 81 MG tablet Take 1 tablet (81 mg total) by mouth daily.  1 tablet  0  . atorvastatin (LIPITOR) 40 MG tablet Take 1 tablet (40 mg total) by mouth daily.  90 tablet  1  . Blood Glucose Calibration (ACCU-CHEK SMARTVIEW CONTROL) LIQD Use as directed  1 each  1  . Blood Glucose Monitoring Suppl (ACCU-CHEK NANO SMARTVIEW) W/DEVICE KIT Use to check blood sugars 2 times per day dx code 250.02  1 kit  1  . Bromocriptine Mesylate (CYCLOSET) 0.8 MG TABS Take 6 tablets once a day  540 tablet  1  . cholecalciferol (VITAMIN D) 1000 UNITS tablet Take 2,000 Units by mouth daily.      Marland Kitchen Co-Enzyme Q-10 100 MG CAPS Take 100 mg by mouth daily.      . ferrous sulfate 325 (65 FE) MG tablet Take 325 mg by mouth 2 (two) times daily with a meal.      .  folic acid (FOLVITE) 756 MCG tablet Take 800 mcg by mouth daily.      Marland Kitchen glipiZIDE (GLUCOTROL XL) 10 MG 24 hr tablet Take 1 tablet (10 mg total) by mouth daily with breakfast.  90 tablet  1  . glucose blood (ACCU-CHEK SMARTVIEW) test strip Use as instructed to check blood sugar 2 times per day dx code 250.02  200 each  1  . metFORMIN (GLUCOPHAGE) 1000 MG tablet Take 1 tablet (1,000 mg total) by mouth 2 (two) times daily with a meal.  180 tablet  3  . miglitol (GLYSET) 50 MG tablet Take 1 tablet (50 mg total) by mouth 3 (three) times daily with meals.  90 tablet  5  . Multiple Vitamins-Minerals  (BEROCCA PO) Take 1 tablet by mouth daily.      . Multiple Vitamins-Minerals (MEMORY VITE) TABS Take 1 tablet by mouth daily.      . Multiple Vitamins-Minerals (PRESERVISION AREDS 2) CAPS Take 2 capsules by mouth daily.      . nebivolol (BYSTOLIC) 5 MG tablet Take 5 mg by mouth daily.      Marland Kitchen omega-3 acid ethyl esters (LOVAZA) 1 G capsule Take 1 g by mouth daily.      . sitaGLIPtin (JANUVIA) 100 MG tablet Take 1 tablet (100 mg total) by mouth daily.  30 tablet  5  . tamsulosin (FLOMAX) 0.4 MG CAPS capsule Take 1 capsule (0.4 mg total) by mouth daily.  30 capsule  0  . vitamin B-12 (CYANOCOBALAMIN) 500 MCG tablet Take 1,000 mcg by mouth daily.       No current facility-administered medications on file prior to visit.     Past Medical History  Diagnosis Date  . Essential hypertension, benign   . Other and unspecified hyperlipidemia   . Chronic kidney disease, stage II (mild)   . Type II or unspecified type diabetes mellitus without mention of complication, uncontrolled   . PAD (peripheral artery disease)   . Orthostasis   . CAD (coronary artery disease)     with IMI and BMS 1997  . Gout      Past Surgical History  Procedure Laterality Date  . Hernia repair    . Cholecystectomy    . Cataract extraction    . Melanoma excision    . Skin lesion excision       Family History  Problem Relation Age of Onset  . Colon cancer Father   . Heart disease Mother      History   Social History  . Marital Status: Married    Spouse Name: N/A    Number of Children: N/A  . Years of Education: N/A   Occupational History  . Not on file.   Social History Main Topics  . Smoking status: Current Every Day Smoker  . Smokeless tobacco: Never Used  . Alcohol Use: Not on file  . Drug Use: Not on file  . Sexual Activity: Not on file   Other Topics Concern  . Not on file   Social History Narrative  . No narrative on file     ROS A 10 point review of system was performed. It is  negative other than that mentioned in the history of present illness.   PHYSICAL EXAM   BP 115/52  Pulse 74  Ht _0  (1.88 m)  Wt 171 lb 6.4 oz (77.747 kg)  BMI 22.00 kg/m2 Constitutional: He is oriented to person, place, and time. He appears well-developed and well-nourished. No  distress.  HENT: No nasal discharge.  Head: Normocephalic and atraumatic.  Eyes: Pupils are equal and round.  No discharge. Neck: Normal range of motion. Neck supple. No JVD present. No thyromegaly present.  Cardiovascular: Normal rate, regular rhythm, normal heart sounds. Exam reveals no gallop and no friction rub. No murmur heard.  Pulmonary/Chest: Effort normal and breath sounds normal. No stridor. No respiratory distress. He has no wheezes. He has no rales. He exhibits no tenderness.  Abdominal: Soft. Bowel sounds are normal. He exhibits no distension. There is no tenderness. There is no rebound and no guarding.  Musculoskeletal: Normal range of motion. He exhibits no edema and no tenderness.  Neurological: He is alert and oriented to person, place, and time. Coordination normal.  Skin: Skin is warm and dry. No rash noted. He is not diaphoretic. No erythema. No pallor.  Psychiatric: He has a normal mood and affect. His behavior is normal. Judgment and thought content normal.  Vascular: Femoral pulses are normal. Distal pulses are not palpable. There is a small 1 cm ulceration on the medial side of the right foot. This is very superficial.   EKG: Normal sinus rhythm    ASSESSMENT AND PLAN

## 2013-11-01 NOTE — Assessment & Plan Note (Signed)
The patient does not have good options for endovascular intervention. Recommend an attempt of medical therapy and a walking program. He did not tolerate Pletal due to itching and possible allergic reaction. if he fails medical therapy, revascularization options include left common femoral artery endarterectomy and possible femoropopliteal bypass on the right.  Overall, he does not feed limited enough to justify surgical revascularization. If he needs to have resection of skin lesions on his lower extremities, I think that can be done at a reasonable risk. Toe pressure was above 50 mmHg bilaterally.

## 2013-11-05 ENCOUNTER — Telehealth: Payer: Self-pay | Admitting: Endocrinology

## 2013-11-05 ENCOUNTER — Other Ambulatory Visit: Payer: Self-pay | Admitting: *Deleted

## 2013-11-05 MED ORDER — BROMOCRIPTINE MESYLATE 0.8 MG PO TABS: ORAL_TABLET | ORAL | Status: DC

## 2013-11-05 NOTE — Telephone Encounter (Signed)
Pt calling again. The problem with the pharmacy is that for the cycloset they are charging him over $200 dollars for it and last month he only had to pay $45. Please assist

## 2013-11-05 NOTE — Telephone Encounter (Signed)
Patient would like Mario [name] to call   Call back: [phone]  Thank You :)

## 2013-11-18 ENCOUNTER — Other Ambulatory Visit: Payer: Self-pay | Admitting: Endocrinology

## 2013-12-31 ENCOUNTER — Encounter: Payer: Self-pay | Admitting: Cardiovascular Disease

## 2013-12-31 ENCOUNTER — Ambulatory Visit (INDEPENDENT_AMBULATORY_CARE_PROVIDER_SITE_OTHER): Payer: Commercial Managed Care - HMO | Admitting: Cardiovascular Disease

## 2013-12-31 VITALS — BP 138/62 | HR 68 | Ht 74.0 in | Wt 168.0 lb

## 2013-12-31 DIAGNOSIS — I739 Peripheral vascular disease, unspecified: Secondary | ICD-10-CM

## 2013-12-31 NOTE — Progress Notes (Signed)
Primary care physician: Dr. [name] Primary cardiologist: Dr. Tamala Julian  HPI  This is a pleasant 73 year old male who was is here today for a follow up visit regarding peripheral arterial disease. He has known history of coronary artery disease with previous angioplasty and stent placement in 1997. He has prolonged history of type 2 diabetes for at least 25 years with mild chronic kidney disease. He has no history of heart failure. He reports prolonged history of bilateral calf pain with walking over the years which has worsened gradually and affect in his ability to perform activities of daily living and exercise. The calf pain is slightly worse on the left side and usually happens after he walks about 150. This causes him to stop for about 5 minutes to rest before he can resume. He used to play golf regularly but stopped doing that. He does play tennis but he is usually limited with claudication. He is not a smoker. 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 Noninvasive evaluation showed evidence of distal right SFA occlusion into the popliteal. There was significant stenosis involving the proximal and distal left SFA. ABI was moderately reduced on the right side and not obtainable on the left side due to noncompressible vessels. I proceeded with angiography in May,2015 which showed: 1. Heavily calcified aorta with no evidence of aneurysm or obstructive disease.  2. 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  3. Borderline significant left common iliac artery calcified stenosis. The peak to peak gradient was only between 10-15 mmHg.  4. Heavily calcified and diseased left common femoral artery extending into the ostium of the profunda and SFA.  5. Severe left popliteal arteries stenosis  6. Occluded distal right SFA into proximal popliteal artery.  7. Three-vessel runoff below the knee bilaterally.   He was started on Pletal but did not tolerate the medication due to itching.  he has a small  skin lesion on the medial malleolus .    Allergies  Allergen Reactions  . Penicillins Anaphylaxis  . Plavix [Clopidogrel Bisulfate] Hives  . Pletal [Cilostazol] Itching     Current Outpatient Prescriptions on File Prior to Visit  Medication Sig Dispense Refill  . ACCU-CHEK FASTCLIX LANCETS MISC Use to check blood sugar 2 times per day dx code 250.02  200 each  1  . acetaminophen (TYLENOL) 500 MG tablet Take 1,000 mg by mouth daily.      . Alcohol Swabs (B-D SINGLE USE SWABS REGULAR) PADS Use 4 swabs per day  220 each  1  . allopurinol (ZYLOPRIM) 100 MG tablet TAKE 1 TABLET BY MOUTH DAILY  90 tablet  1  . aspirin EC 81 MG tablet Take 1 tablet (81 mg total) by mouth daily.  1 tablet  0  . atorvastatin (LIPITOR) 40 MG tablet TAKE 1 TABLET EVERY DAY  90 tablet  1  . Blood Glucose Calibration (ACCU-CHEK SMARTVIEW CONTROL) LIQD Use as directed  1 each  1  . Blood Glucose Monitoring Suppl (ACCU-CHEK NANO SMARTVIEW) W/DEVICE KIT Use to check blood sugars 2 times per day dx code 250.02  1 kit  1  . Bromocriptine Mesylate (CYCLOSET) 0.8 MG TABS Take 6 tablets once a day  540 tablet  1  . cholecalciferol (VITAMIN D) 1000 UNITS tablet Take 2,000 Units by mouth daily.      Marland Kitchen Co-Enzyme Q-10 100 MG CAPS Take 100 mg by mouth daily.      . ferrous sulfate 325 (65 FE) MG tablet Take 325 mg by  mouth 2 (two) times daily with a meal.      . folic acid (FOLVITE) 622 MCG tablet Take 800 mcg by mouth daily.      Marland Kitchen GLIPIZIDE XL 10 MG 24 hr tablet TAKE 1 TABLET BY MOUTH DAILY WITH BREAKFAST  90 tablet  1  . glucose blood (ACCU-CHEK SMARTVIEW) test strip Use as instructed to check blood sugar 2 times per day dx code 250.02  200 each  1  . metFORMIN (GLUCOPHAGE) 1000 MG tablet Take 1 tablet (1,000 mg total) by mouth 2 (two) times daily with a meal.  180 tablet  3  . miglitol (GLYSET) 50 MG tablet Take 1 tablet (50 mg total) by mouth 3 (three) times daily with meals.  90 tablet  5  . Multiple Vitamins-Minerals  (BEROCCA PO) Take 1 tablet by mouth daily.      . Multiple Vitamins-Minerals (MEMORY VITE) TABS Take 1 tablet by mouth daily.      . Multiple Vitamins-Minerals (PRESERVISION AREDS 2) CAPS Take 2 capsules by mouth daily.      . nebivolol (BYSTOLIC) 5 MG tablet Take 5 mg by mouth daily.      Marland Kitchen omega-3 acid ethyl esters (LOVAZA) 1 G capsule Take 1 g by mouth daily.      . sitaGLIPtin (JANUVIA) 100 MG tablet Take 1 tablet (100 mg total) by mouth daily.  30 tablet  5  . tamsulosin (FLOMAX) 0.4 MG CAPS capsule Take 1 capsule (0.4 mg total) by mouth daily.  30 capsule  0  . vitamin B-12 (CYANOCOBALAMIN) 500 MCG tablet Take 1,000 mcg by mouth daily.       No current facility-administered medications on file prior to visit.     Past Medical History  Diagnosis Date  . Essential hypertension, benign   . Other and unspecified hyperlipidemia   . Chronic kidney disease, stage II (mild)   . Type II or unspecified type diabetes mellitus without mention of complication, uncontrolled   . PAD (peripheral artery disease)   . Orthostasis   . CAD (coronary artery disease)     with IMI and BMS 1997  . Gout      Past Surgical History  Procedure Laterality Date  . Hernia repair    . Cholecystectomy    . Cataract extraction    . Melanoma excision    . Skin lesion excision       Family History  Problem Relation Age of Onset  . Colon cancer Father   . Heart disease Mother      History   Social History  . Marital Status: Married    Spouse Name: N/A    Number of Children: N/A  . Years of Education: N/A   Occupational History  . Not on file.   Social History Main Topics  . Smoking status: Current Every Day Smoker  . Smokeless tobacco: Never Used  . Alcohol Use: No  . Drug Use: No  . Sexual Activity: Not on file   Other Topics Concern  . Not on file   Social History Narrative  . No narrative on file     ROS A 10 point review of system was performed. It is negative other than  that mentioned in the history of present illness.   PHYSICAL EXAM   BP 138/62  Pulse 68  Ht 6' 2"  (1.88 m)  Wt 168 lb (76.204 kg)  BMI 21.56 kg/m2 Constitutional: He is oriented to person, place, and time. He appears  well-developed and well-nourished. No distress.  HENT: No nasal discharge.  Head: Normocephalic and atraumatic.  Eyes: Pupils are equal and round.  No discharge. Neck: Normal range of motion. Neck supple. No JVD present. No thyromegaly present.  Cardiovascular: Normal rate, regular rhythm, normal heart sounds. Exam reveals no gallop and no friction rub. No murmur heard.  Pulmonary/Chest: Effort normal and breath sounds normal. No stridor. No respiratory distress. He has no wheezes. He has no rales. He exhibits no tenderness.  Abdominal: Soft. Bowel sounds are normal. He exhibits no distension. There is no tenderness. There is no rebound and no guarding.  Musculoskeletal: Normal range of motion. He exhibits no edema and no tenderness.  Neurological: He is alert and oriented to person, place, and time. Coordination normal.  Skin: Skin is warm and dry. No rash noted. He is not diaphoretic. No erythema. No pallor.  Psychiatric: He has a normal mood and affect. His behavior is normal. Judgment and thought content normal.  Vascular: Femoral pulses are normal. Distal pulses are not palpable. There is a small 4-5 mm skin lesion on the medial side of the right foot. This is very superficial.       ASSESSMENT AND PLAN

## 2013-12-31 NOTE — Patient Instructions (Signed)
Your physician recommends that you schedule a follow-up appointment in: 3 MONTHS with Dr Fletcher Anon  Your physician recommends that you continue on your current medications as directed. Please refer to the Current Medication list given to you today.

## 2013-12-31 NOTE — Assessment & Plan Note (Signed)
He has a skin lesion on the medial side of that malleolus which is likely not an ischemic ulcer given the appearance and location. He does have extensive dermatologic history and I asked him to followup with his dermatologist for possible biopsy. Otherwise, his claudication seems to be stable. He continues to exercise regularly playing tennis and swimming.

## 2014-01-13 ENCOUNTER — Telehealth: Payer: Self-pay | Admitting: *Deleted

## 2014-01-13 NOTE — Telephone Encounter (Signed)
Left message on machine for patient to give Dr Ronnie Derby office to follow up with diabetes Labs already ordered Diabetic bundle

## 2014-02-03 ENCOUNTER — Other Ambulatory Visit: Payer: Self-pay | Admitting: Endocrinology

## 2014-02-14 ENCOUNTER — Other Ambulatory Visit (INDEPENDENT_AMBULATORY_CARE_PROVIDER_SITE_OTHER): Payer: Commercial Managed Care - HMO

## 2014-02-14 DIAGNOSIS — E1159 Type 2 diabetes mellitus with other circulatory complications: Secondary | ICD-10-CM

## 2014-02-14 LAB — COMPREHENSIVE METABOLIC PANEL
ALT: 34 U/L (ref 0–53)
AST: 30 U/L (ref 0–37)
Albumin: 3.9 g/dL (ref 3.5–5.2)
Alkaline Phosphatase: 56 U/L (ref 39–117)
BUN: 24 mg/dL — ABNORMAL HIGH (ref 6–23)
CO2: 22 mEq/L (ref 19–32)
Calcium: 8.8 mg/dL (ref 8.4–10.5)
Chloride: 109 mEq/L (ref 96–112)
Creatinine, Ser: 1.1 mg/dL (ref 0.4–1.5)
GFR: 68.33 mL/min (ref >60.00)
Glucose, Bld: 143 mg/dL — ABNORMAL HIGH (ref 70–99)
Potassium: 4.5 mEq/L (ref 3.5–5.1)
Sodium: 136 mEq/L (ref 135–145)
Total Bilirubin: 0.7 mg/dL (ref 0.2–1.2)
Total Protein: 6.1 g/dL (ref 6.0–8.3)

## 2014-02-14 LAB — HEMOGLOBIN A1C: Hgb A1c MFr Bld: 7.7 % — ABNORMAL HIGH (ref 4.6–6.5)

## 2014-02-18 ENCOUNTER — Ambulatory Visit (INDEPENDENT_AMBULATORY_CARE_PROVIDER_SITE_OTHER): Payer: Commercial Managed Care - HMO | Admitting: Endocrinology

## 2014-02-18 ENCOUNTER — Encounter: Payer: Self-pay | Admitting: Endocrinology

## 2014-02-18 VITALS — BP 142/68 | HR 71 | Temp 98.2°F | Resp 16 | Ht 74.0 in | Wt 172.6 lb

## 2014-02-18 DIAGNOSIS — I10 Essential (primary) hypertension: Secondary | ICD-10-CM

## 2014-02-18 DIAGNOSIS — E1159 Type 2 diabetes mellitus with other circulatory complications: Secondary | ICD-10-CM

## 2014-02-18 DIAGNOSIS — Z23 Encounter for immunization: Secondary | ICD-10-CM

## 2014-02-18 DIAGNOSIS — E785 Hyperlipidemia, unspecified: Secondary | ICD-10-CM

## 2014-02-18 NOTE — Progress Notes (Signed)
Patient ID: Mario [name], male   DOB: May 09, 1941, 73 y.o.   MRN: [id number]   Reason for Appointment: Diabetes follow-up   History of Present Illness   Diagnosis: Type 2 DIABETES MELITUS, date of diagnosis:  1989     Previous history: He was initially diagnosed when hospitalized for pancreatitis and his previous endocrinologist had treated him with multiple medications because of progression of his diabetes. He was also put on Cycloset  which he has tolerated maximum dose He has had adjustments of his medication dosages the last couple of years and Januvia was restarted in 5/14 when blood sugars were higher. Dosages have been adjusted based on renal function Previously an A1c levels have been ranging from 6.6-7.5 His metformin was increased  when renal function was better and glipizide was changed to glipizide ER 10 mg. Did not appear to have better blood sugar control with Invokana  Recent history:  He has not been seen in followup since 3/15 He continues to be on a  5 drug diabetes regimen including mealtime Glyset  Because of cost he is taking only 3 Cycloset daily, he does take as directed this in the early morning Tolerating all medications well Again despite reminders he is still taking his Glyset during or after his meal instead of before eating He said he does not eat lunch usually but not clear if he has postprandial hyperglycemia since he checks blood sugars primarily before breakfast His A1c is slightly better than before but still not below 7% This is despite his trying to exercise more regularly Again concerned about the cost of medications; advised him that all the newly approved drugs are going to be significantly extensive  Oral hypoglycemic drugs: Januvia, Glyset, glipizide, Metformin,Cycloset        Side effects from medications: None Proper timing of medications in relation to meals: no  Monitors blood glucose: Once a day.    Glucometer:   Accu-Chek         Blood  Glucose readings from meter download:  PREMEAL Breakfast Lunch  2 PM  Bedtime Overall  Glucose range:  110-173   183   247     Mean/median:  139     146   Hypoglycemia: None recently      Meals: 3 meals per day.          Physical activity: exercise: Tennis or going to the gym, swimming 3-4/7.  He is doing part-time work delivering newspapers to businesses            [employer] from Last 3 Encounters:  02/18/14 172 lb 9.6 oz (78.291 kg)  12/31/13 168 lb (76.204 kg)  10/29/13 171 lb 6.4 oz (77.747 kg)    Lab Results  Component Value Date   HGBA1C 7.7* 02/14/2014   HGBA1C 8.2* 08/13/2013   HGBA1C 8.5* 06/11/2013   Lab Results  Component Value Date   MICROALBUR 21.9* 01/28/2013   LDLCALC 46 08/13/2013   CREATININE 1.1 02/14/2014    PROBLEM 2: Mild chronic kidney disease; his creatinine is relatively good now. No orthostatic symptoms of lightheadedness  LABS:  Appointment on 02/14/2014  Component Date Value Ref Range Status  . Hemoglobin A1C 02/14/2014 7.7* 4.6 - 6.5 % Final   Glycemic Control Guidelines for People with Diabetes:Non Diabetic:  <6%Goal of Therapy: <7%Additional Action Suggested:  >8%   . Sodium 02/14/2014 136  135 - 145 mEq/L Final  . Potassium 02/14/2014 4.5  3.5 - 5.1 mEq/L Final  .  Chloride 02/14/2014 109  96 - 112 mEq/L Final  . CO2 02/14/2014 22  19 - 32 mEq/L Final  . Glucose, Bld 02/14/2014 143* 70 - 99 mg/dL Final  . BUN 02/14/2014 24* 6 - 23 mg/dL Final  . Creatinine, Ser 02/14/2014 1.1  0.4 - 1.5 mg/dL Final  . Total Bilirubin 02/14/2014 0.7  0.2 - 1.2 mg/dL Final  . Alkaline Phosphatase 02/14/2014 56  39 - 117 U/L Final  . AST 02/14/2014 30  0 - 37 U/L Final  . ALT 02/14/2014 34  0 - 53 U/L Final  . Total Protein 02/14/2014 6.1  6.0 - 8.3 g/dL Final  . Albumin 02/14/2014 3.9  3.5 - 5.2 g/dL Final  . Calcium 02/14/2014 8.8  8.4 - 10.5 mg/dL Final  . GFR 02/14/2014 68.33  >60.00 mL/min Final      Medication List       This list is accurate as  of: 02/18/14 10:50 AM.  Always use your most recent med list.               ACCU-CHEK NANO SMARTVIEW W/DEVICE Kit  Use to check blood sugars 2 times per day dx code 250.02     ACCU-CHEK SMARTVIEW CONTROL Liqd  Use as directed     acetaminophen 500 MG tablet  Commonly known as:  TYLENOL  Take 1,000 mg by mouth daily.     allopurinol 100 MG tablet  Commonly known as:  ZYLOPRIM  TAKE 1 TABLET BY MOUTH DAILY     aspirin EC 81 MG tablet  Take 1 tablet (81 mg total) by mouth daily.     atorvastatin 40 MG tablet  Commonly known as:  LIPITOR  TAKE 1 TABLET EVERY DAY     B-D SINGLE USE SWABS REGULAR Pads  Use 4 swabs per day     Bromocriptine Mesylate 0.8 MG Tabs  Commonly known as:  CYCLOSET  Take 6 tablets once a day     cholecalciferol 1000 UNITS tablet  Commonly known as:  VITAMIN D  Take 2,000 Units by mouth daily.     Co-Enzyme Q-10 100 MG Caps  Take 100 mg by mouth daily.     ferrous sulfate 325 (65 FE) MG tablet  Take 325 mg by mouth 2 (two) times daily with a meal.     folic acid 734 MCG tablet  Commonly known as:  FOLVITE  Take 800 mcg by mouth daily.     GLIPIZIDE XL 10 MG 24 hr tablet  Generic drug:  glipiZIDE  TAKE 1 TABLET BY MOUTH DAILY WITH BREAKFAST     glucose blood test strip  Commonly known as:  ACCU-CHEK SMARTVIEW  Use as instructed to check blood sugar 2 times per day dx code 250.02     metFORMIN 1000 MG tablet  Commonly known as:  GLUCOPHAGE  Take 1 tablet (1,000 mg total) by mouth 2 (two) times daily with a meal.     MICROLET LANCETS Misc  USE ONE LANCET TO CHECK GLUCOSE TWICE DAILY AS DIRECTED     miglitol 50 MG tablet  Commonly known as:  GLYSET  Take 1 tablet (50 mg total) by mouth 3 (three) times daily with meals.     nebivolol 5 MG tablet  Commonly known as:  BYSTOLIC  Take 5 mg by mouth daily.     omega-3 acid ethyl esters 1 G capsule  Commonly known as:  LOVAZA  Take 1 g by mouth daily.  PRESERVISION AREDS 2 Caps   Take 2 capsules by mouth daily.     MEMORY VITE Tabs  Take 1 tablet by mouth daily.     BEROCCA PO  Take 1 tablet by mouth daily.     sitaGLIPtin 100 MG tablet  Commonly known as:  JANUVIA  Take 1 tablet (100 mg total) by mouth daily.     tamsulosin 0.4 MG Caps capsule  Commonly known as:  FLOMAX  Take 1 capsule (0.4 mg total) by mouth daily.     vitamin B-12 500 MCG tablet  Commonly known as:  CYANOCOBALAMIN  Take 1,000 mcg by mouth daily.        Allergies:  Allergies  Allergen Reactions  . Penicillins Anaphylaxis  . Plavix [Clopidogrel Bisulfate] Hives  . Pletal [Cilostazol] Itching    Past Medical History  Diagnosis Date  . Essential hypertension, benign   . Other and unspecified hyperlipidemia   . Chronic kidney disease, stage II (mild)   . Type II or unspecified type diabetes mellitus without mention of complication, uncontrolled   . PAD (peripheral artery disease)   . Orthostasis   . CAD (coronary artery disease)     with IMI and BMS 1997  . Gout     Past Surgical History  Procedure Laterality Date  . Hernia repair    . Cholecystectomy    . Cataract extraction    . Melanoma excision    . Skin lesion excision      Family History  Problem Relation Age of Onset  . Colon cancer Father   . Heart disease Mother     Social History:  reports that he has been smoking.  He has never used smokeless tobacco. He reports that he does not drink alcohol or use illicit drugs.  Review of Systems:  Hypertension:  currently controlled only on Bystolic $RemoveBef'5mg'xLhthbUQMr$ , also followed by cardiologist Does not monitor at home  Lipids: Well controlled with atorvastatin. He has been given fenofibrate previously but this was not restarted since triglycerides were reasonably good   Lab Results  Component Value Date   CHOL 115 08/13/2013   HDL 37* 08/13/2013   LDLCALC 46 08/13/2013   TRIG 158* 08/13/2013   CHOLHDL 3.1 08/13/2013    Foot exam done in 3/15, has callus formation  and mild sensory loss as well as decreased pulses  History of BPH   Examination:   BP 142/68  Wt 172 lb 9.6 oz (78.291 kg)  Body mass index is 22.15 kg/(m^2).    ASSESSMENT/ PLAN:   Diabetes type 2   Blood glucose control is relatively better with continuing his Cycloset although he is taking only half the prescribe dose  A1c is probably reasonable considering his age, duration of diabetes and comorbid conditions Would like to simplify his diabetes regimen since he is taking 5 drugs but he is reluctant to consider insulin  Also drugs like Victoza or Invokana would be significantly expensive with his Medicare gap  Recently renal function is adequate and will not require adjustment of his doses  No hypoglycemia with glipizide at this time  Discussed  day-to-day management  and following recommendations were made  Check at least half of the blood sugars after meals  day especially after supper  Continued  regular exercise   If eating out at lunch time take Glyset with him   Moderate amounts of carbohydrate and fat in meals especially when eating out  Take Glyset consistently right before eating  May consider increasing Cycloset if having postprandial hyperglycemia, this was discussed  Consider getting assistance from the pharmaceutical manufacturer for this drug  Neuropathy: He is not complaining of symptoms today  HYPERTENSION: Blood pressure is normal with Bystolic 5 mg and he will continue Needs followup urine microalbumin  HYPERLIPIDEMIA: His LDL is well-controlled and he  will need followup levels  History of renal insufficiency: Appears resolved  Counseling time over 50% of today's 25 minute visit  Palestine Mosco 02/18/2014, 10:50 AM

## 2014-02-18 NOTE — Patient Instructions (Addendum)
Please check blood sugars at least half the time about 2 hours after any meal especially DINNER and 3-4  times per week on waking up. Please bring blood sugar monitor to each visit  TAKE glyset JUST BEFORE EATING  Cycloset 4 only if sugars > 200 at bedtime consistently

## 2014-03-04 ENCOUNTER — Other Ambulatory Visit: Payer: Self-pay | Admitting: *Deleted

## 2014-03-04 MED ORDER — MIGLITOL 50 MG PO TABS: 50.0000 mg | ORAL_TABLET | Freq: Three times a day (TID) | ORAL | Status: DC

## 2014-04-01 ENCOUNTER — Telehealth: Payer: Self-pay | Admitting: Cardiovascular Disease

## 2014-04-01 NOTE — Telephone Encounter (Deleted)
Error

## 2014-04-03 ENCOUNTER — Telehealth: Payer: Self-pay | Admitting: *Deleted

## 2014-04-03 NOTE — Telephone Encounter (Signed)
Bystolic samples placed at the front desk for patient.

## 2014-04-08 ENCOUNTER — Encounter: Payer: Self-pay | Admitting: Cardiovascular Disease

## 2014-04-08 ENCOUNTER — Ambulatory Visit (INDEPENDENT_AMBULATORY_CARE_PROVIDER_SITE_OTHER): Payer: Commercial Managed Care - HMO | Admitting: Cardiovascular Disease

## 2014-04-08 VITALS — BP 115/58 | HR 75 | Ht 74.0 in | Wt 174.0 lb

## 2014-04-08 DIAGNOSIS — I2510 Atherosclerotic heart disease of native coronary artery without angina pectoris: Secondary | ICD-10-CM

## 2014-04-08 DIAGNOSIS — I739 Peripheral vascular disease, unspecified: Secondary | ICD-10-CM

## 2014-04-08 DIAGNOSIS — Z720 Tobacco use: Secondary | ICD-10-CM | POA: Insufficient documentation

## 2014-04-08 DIAGNOSIS — I10 Essential (primary) hypertension: Secondary | ICD-10-CM

## 2014-04-08 NOTE — Assessment & Plan Note (Signed)
He has no symptoms of angina. 

## 2014-04-08 NOTE — Progress Notes (Signed)
Primary care physician: Dr. [name] Primary cardiologist: Dr. Tamala Julian  HPI  This is a pleasant 73 year old male who was is here today for a follow up visit regarding peripheral arterial disease. He has known history of coronary artery disease with previous angioplasty and stent placement in 1997. He has prolonged history of type 2 diabetes for at least 25 years with mild chronic kidney disease. He has no history of heart failure. He reports prolonged history of bilateral calf pain with walking over the years which has worsened gradually and affect in his ability to perform activities of daily living and exercise. The calf pain is slightly worse on the left side and usually happens after he walks about 150. This causes him to stop for about 5 minutes to rest before he can resume. He used to play golf regularly but stopped doing that. He does play tennis but he is usually limited with claudication. 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  Noninvasive evaluation showed evidence of distal right SFA occlusion into the popliteal. There was significant stenosis involving the proximal and distal left SFA. ABI was moderately reduced on the right side and not obtainable on the left side due to noncompressible vessels. I proceeded with angiography in May,2015 which showed: 1. Heavily calcified aorta with no evidence of aneurysm or obstructive disease.  2. 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  3. Borderline significant left common iliac artery calcified stenosis. The peak to peak gradient was only between 10-15 mmHg.  4. Heavily calcified and diseased left common femoral artery extending into the ostium of the profunda and SFA.  5. Severe left popliteal arteries stenosis  6. Occluded distal right SFA into proximal popliteal artery.  7. Three-vessel runoff below the knee bilaterally.   He was started on Pletal but did not tolerate the medication due to itching. His symptoms have been  stable.    Allergies  Allergen Reactions  . Penicillins Anaphylaxis  . Plavix [Clopidogrel Bisulfate] Hives  . Pletal [Cilostazol] Itching     Current Outpatient Prescriptions on File Prior to Visit  Medication Sig Dispense Refill  . acetaminophen (TYLENOL) 500 MG tablet Take 1,000 mg by mouth daily.    Marland Kitchen allopurinol (ZYLOPRIM) 100 MG tablet TAKE 1 TABLET BY MOUTH DAILY 90 tablet 1  . aspirin EC 81 MG tablet Take 1 tablet (81 mg total) by mouth daily. 1 tablet 0  . atorvastatin (LIPITOR) 40 MG tablet TAKE 1 TABLET EVERY DAY 90 tablet 1  . Blood Glucose Calibration (ACCU-CHEK SMARTVIEW CONTROL) LIQD Use as directed 1 each 1  . Blood Glucose Monitoring Suppl (ACCU-CHEK NANO SMARTVIEW) W/DEVICE KIT Use to check blood sugars 2 times per day dx code 250.02 1 kit 1  . Bromocriptine Mesylate (CYCLOSET) 0.8 MG TABS Take 6 tablets once a day 540 tablet 1  . cholecalciferol (VITAMIN D) 1000 UNITS tablet Take 2,000 Units by mouth daily.    Marland Kitchen Co-Enzyme Q-10 100 MG CAPS Take 100 mg by mouth daily.    . ferrous sulfate 325 (65 FE) MG tablet Take 325 mg by mouth 2 (two) times daily with a meal.    . folic acid (FOLVITE) 742 MCG tablet Take 800 mcg by mouth daily.    Marland Kitchen GLIPIZIDE XL 10 MG 24 hr tablet TAKE 1 TABLET BY MOUTH DAILY WITH BREAKFAST 90 tablet 1  . glucose blood (ACCU-CHEK SMARTVIEW) test strip Use as instructed to check blood sugar 2 times per day dx code 250.02 200 each 1  .  metFORMIN (GLUCOPHAGE) 1000 MG tablet Take 1 tablet (1,000 mg total) by mouth 2 (two) times daily with a meal. 180 tablet 3  . MICROLET LANCETS MISC USE ONE LANCET TO CHECK GLUCOSE TWICE DAILY AS DIRECTED 100 each 3  . miglitol (GLYSET) 50 MG tablet Take 1 tablet (50 mg total) by mouth 3 (three) times daily with meals. 90 tablet 5  . Multiple Vitamins-Minerals (BEROCCA PO) Take 1 tablet by mouth daily.    . Multiple Vitamins-Minerals (MEMORY VITE) TABS Take 1 tablet by mouth daily.    . Multiple Vitamins-Minerals  (PRESERVISION AREDS 2) CAPS Take 2 capsules by mouth daily.    . nebivolol (BYSTOLIC) 5 MG tablet Take 5 mg by mouth daily.    Marland Kitchen omega-3 acid ethyl esters (LOVAZA) 1 G capsule Take 1 g by mouth daily.    . sitaGLIPtin (JANUVIA) 100 MG tablet Take 1 tablet (100 mg total) by mouth daily. 30 tablet 5  . tamsulosin (FLOMAX) 0.4 MG CAPS capsule Take 1 capsule (0.4 mg total) by mouth daily. 30 capsule 0  . vitamin B-12 (CYANOCOBALAMIN) 500 MCG tablet Take 1,000 mcg by mouth daily.     No current facility-administered medications on file prior to visit.     Past Medical History  Diagnosis Date  . Essential hypertension, benign   . Other and unspecified hyperlipidemia   . Chronic kidney disease, stage II (mild)   . Type II or unspecified type diabetes mellitus without mention of complication, uncontrolled   . PAD (peripheral artery disease)   . Orthostasis   . CAD (coronary artery disease)     with IMI and BMS 1997  . Gout      Past Surgical History  Procedure Laterality Date  . Hernia repair    . Cholecystectomy    . Cataract extraction    . Melanoma excision    . Skin lesion excision       Family History  Problem Relation Age of Onset  . Colon cancer Father   . Heart disease Mother      History   Social History  . Marital Status: Married    Spouse Name: N/A    Number of Children: N/A  . Years of Education: N/A   Occupational History  . Not on file.   Social History Main Topics  . Smoking status: Current Every Day Smoker  . Smokeless tobacco: Never Used  . Alcohol Use: No  . Drug Use: No  . Sexual Activity: Not on file   Other Topics Concern  . Not on file   Social History Narrative     ROS A 10 point review of system was performed. It is negative other than that mentioned in the history of present illness.   PHYSICAL EXAM   BP 115/58 mmHg  Pulse 75  Ht 6' 2"  (1.88 m)  Wt 174 lb (78.926 kg)  BMI 22.33 kg/m2 Constitutional: He is oriented to  person, place, and time. He appears well-developed and well-nourished. No distress.  HENT: No nasal discharge.  Head: Normocephalic and atraumatic.  Eyes: Pupils are equal and round.  No discharge. Neck: Normal range of motion. Neck supple. No JVD present. No thyromegaly present.  Cardiovascular: Normal rate, regular rhythm, normal heart sounds. Exam reveals no gallop and no friction rub. No murmur heard.  Pulmonary/Chest: Effort normal and breath sounds normal. No stridor. No respiratory distress. He has no wheezes. He has no rales. He exhibits no tenderness.  Abdominal: Soft. Bowel sounds  are normal. He exhibits no distension. There is no tenderness. There is no rebound and no guarding.  Musculoskeletal: Normal range of motion. He exhibits no edema and no tenderness.  Neurological: He is alert and oriented to person, place, and time. Coordination normal.  Skin: Skin is warm and dry. No rash noted. He is not diaphoretic. No erythema. No pallor.  Psychiatric: He has a normal mood and affect. His behavior is normal. Judgment and thought content normal.  Vascular: Femoral pulses are normal. Distal pulses are not palpable. There is a small 4-5 mm skin lesion on the medial side of the right foot. This is very superficial.       ASSESSMENT AND PLAN

## 2014-04-08 NOTE — Assessment & Plan Note (Signed)
I discussed with him the importance of smoking cessation.

## 2014-04-08 NOTE — Patient Instructions (Signed)
Your physician wants you to follow-up in: 6 MONTHS with Dr Fletcher Anon.  You will receive a reminder letter in the mail two months in advance. If you don't receive a letter, please call our office to schedule the follow-up appointment.  Your physician recommends that you continue on your current medications as directed. Please refer to the Current Medication list given to you today.

## 2014-04-08 NOTE — Assessment & Plan Note (Signed)
Blood pressure is controlled on current medications. 

## 2014-04-08 NOTE — Assessment & Plan Note (Signed)
The patient's claudication is stable and overall moderate. Continue medical therapy for now.

## 2014-05-01 ENCOUNTER — Encounter (HOSPITAL_COMMUNITY): Payer: Self-pay | Admitting: Cardiovascular Disease

## 2014-05-26 ENCOUNTER — Other Ambulatory Visit: Payer: Self-pay | Admitting: Endocrinology

## 2014-05-28 ENCOUNTER — Other Ambulatory Visit: Payer: Commercial Managed Care - HMO

## 2014-05-29 ENCOUNTER — Other Ambulatory Visit (INDEPENDENT_AMBULATORY_CARE_PROVIDER_SITE_OTHER): Payer: Commercial Managed Care - HMO

## 2014-05-29 DIAGNOSIS — N182 Chronic kidney disease, stage 2 (mild): Secondary | ICD-10-CM

## 2014-05-29 DIAGNOSIS — E785 Hyperlipidemia, unspecified: Secondary | ICD-10-CM

## 2014-05-29 DIAGNOSIS — IMO0002 Reserved for concepts with insufficient information to code with codable children: Secondary | ICD-10-CM

## 2014-05-29 DIAGNOSIS — E1165 Type 2 diabetes mellitus with hyperglycemia: Secondary | ICD-10-CM

## 2014-05-29 LAB — COMPREHENSIVE METABOLIC PANEL
ALT: 43 U/L (ref 0–53)
AST: 29 U/L (ref 0–37)
Albumin: 4.1 g/dL (ref 3.5–5.2)
Alkaline Phosphatase: 60 U/L (ref 39–117)
BUN: 20 mg/dL (ref 6–23)
CO2: 23 mEq/L (ref 19–32)
Calcium: 9.2 mg/dL (ref 8.4–10.5)
Chloride: 109 mEq/L (ref 96–112)
Creatinine, Ser: 1.2 mg/dL (ref 0.4–1.5)
GFR: 66.23 mL/min (ref >60.00)
Glucose, Bld: 186 mg/dL — ABNORMAL HIGH (ref 70–99)
Potassium: 4.6 mEq/L (ref 3.5–5.1)
Sodium: 138 mEq/L (ref 135–145)
Total Bilirubin: 0.8 mg/dL (ref 0.2–1.2)
Total Protein: 6.5 g/dL (ref 6.0–8.3)

## 2014-05-29 LAB — LIPID PANEL
Cholesterol: 116 mg/dL (ref 0–200)
HDL: 36.7 mg/dL — ABNORMAL LOW (ref >39.00)
LDL Cholesterol: 52 mg/dL (ref 0–99)
NonHDL: 79.3
Total CHOL/HDL Ratio: 3
Triglycerides: 136 mg/dL (ref 0.0–149.0)
VLDL: 27.2 mg/dL (ref 0.0–40.0)

## 2014-05-29 LAB — HEMOGLOBIN A1C: Hgb A1c MFr Bld: 8.1 % — ABNORMAL HIGH (ref 4.6–6.5)

## 2014-05-30 LAB — URINALYSIS, ROUTINE W REFLEX MICROSCOPIC
Bilirubin Urine: NEGATIVE
Hgb urine dipstick: NEGATIVE
Ketones, ur: NEGATIVE
Leukocytes, UA: NEGATIVE
Nitrite: NEGATIVE
Specific Gravity, Urine: 1.025 (ref 1.000–1.030)
Total Protein, Urine: 30 — AB
Urine Glucose: NEGATIVE
Urobilinogen, UA: 0.2 (ref 0.0–1.0)
pH: 6 (ref 5.0–8.0)

## 2014-05-30 LAB — MICROALBUMIN / CREATININE URINE RATIO
Creatinine,U: 138 mg/dL
Microalb Creat Ratio: 19.7 mg/g (ref 0.0–30.0)
Microalb, Ur: 27.2 mg/dL — ABNORMAL HIGH (ref 0.0–1.9)

## 2014-06-02 ENCOUNTER — Ambulatory Visit (INDEPENDENT_AMBULATORY_CARE_PROVIDER_SITE_OTHER): Payer: Commercial Managed Care - HMO | Admitting: Endocrinology

## 2014-06-02 ENCOUNTER — Encounter: Payer: Self-pay | Admitting: Endocrinology

## 2014-06-02 VITALS — BP 145/66 | HR 68 | Temp 98.3°F | Resp 16 | Ht 74.0 in | Wt 178.6 lb

## 2014-06-02 DIAGNOSIS — N182 Chronic kidney disease, stage 2 (mild): Secondary | ICD-10-CM

## 2014-06-02 DIAGNOSIS — E785 Hyperlipidemia, unspecified: Secondary | ICD-10-CM

## 2014-06-02 DIAGNOSIS — IMO0002 Reserved for concepts with insufficient information to code with codable children: Secondary | ICD-10-CM

## 2014-06-02 DIAGNOSIS — E1165 Type 2 diabetes mellitus with hyperglycemia: Secondary | ICD-10-CM

## 2014-06-02 NOTE — Patient Instructions (Addendum)
Go up to 5 Cycloset in ams  Please check blood sugars at least half the time about 2 hours after any meal and 2 times per week on waking up. Please bring blood sugar monitor to each visit  Call if sugars after meals frequently  Reduce carbs at breakfast

## 2014-06-02 NOTE — Progress Notes (Signed)
Patient ID: Mario [name], male   DOB: Mar 29, 1941, 74 y.o.   MRN: [id number]   Reason for Appointment: Diabetes follow-up   History of Present Illness   Diagnosis: Type 2 DIABETES MELITUS, date of diagnosis:  1989     Previous history: He was initially diagnosed when hospitalized for pancreatitis and his previous endocrinologist had treated him with multiple medications because of progression of his diabetes. He was also put on Cycloset  which he has tolerated maximum dose He has had adjustments of his medication dosages the last couple of years and Januvia was restarted in 5/14 when blood sugars were higher. Dosages have been adjusted based on renal function Previously an A1c levels have been ranging from 6.6-7.5 His metformin was increased  when renal function was better and glipizide was changed to glipizide ER 10 mg. Did not appear to have better blood sugar control with Invokana  Recent history:  His A1c is relatively higher than the last time even though his home fasting blood sugars are averaging about the same recently He continues to be on a  5 drug diabetes regimen including mealtime Glyset; he is taking this before his meals as directed although generally eating 2 meals a day  Because of high cost he is taking only 3-4 Cycloset daily, instead of the full dose; he does take as directed this in the early morning Tolerating all medications well Currently on maximum dose metformin and Januvia since renal function has been stable in the normal range Again despite reminders he is still not checking his blood sugars after meals and mostly in the morning Most likely has postprandial hyperglycemia as seen after his breakfast yesterday He appears to have very high glycemic index breakfast and has little knowledge about meal planning and no recent consultation with dietitian His weight is slightly higher although he is not obese This is despite his trying to exercise more regularly  Oral  hypoglycemic drugs: Januvia, Glyset, glipizide, Metformin 1 g twice a day,Cycloset        Side effects from medications: None Proper timing of medications in relation to meals: no  Monitors blood glucose: Once a day.    Glucometer:   Accu-Chek         Blood Glucose readings from meter download:  PRE-MEAL Breakfast Lunch  PC breakfast   5 PM  Overall  Glucose range: 109-193  117   211   150, 134    Mean/median:  142      144    Hypoglycemia: None recently      Meals: 2-3 meals per day, no lunch often; cereal, [place] bacon, bread and juice at breakfast           Physical activity: exercise:  going to the gym or tennis 3-4/7.  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 Hgb A1c MFr Bld 05/29/2014 8.1*  4.6 - 6.5 % Final   Glycemic Control Guidelines for People with Diabetes:Non Diabetic:  <6%Goal of Therapy: <7%Additional Action Suggested:  >8%   . Sodium 05/29/2014 138  135 - 145 mEq/L Final  . Potassium 05/29/2014 4.6  3.5 - 5.1 mEq/L Final  . Chloride 05/29/2014 109  96 - 112 mEq/L Final  . CO2 05/29/2014 23  19 - 32 mEq/L Final  . Glucose, Bld 05/29/2014 186* 70 - 99 mg/dL Final  . BUN 05/29/2014 20  6 - 23 mg/dL Final  . Creatinine, Ser 05/29/2014 1.2  0.4 - 1.5 mg/dL Final  . Total Bilirubin 05/29/2014 0.8  0.2 - 1.2 mg/dL Final  . Alkaline Phosphatase 05/29/2014 60  39 - 117 U/L Final  . AST 05/29/2014 29  0 - 37 U/L Final  . ALT 05/29/2014 43  0 - 53 U/L Final  . Total Protein 05/29/2014 6.5   6.0 - 8.3 g/dL Final  . Albumin 05/29/2014 4.1  3.5 - 5.2 g/dL Final  . Calcium 05/29/2014 9.2  8.4 - 10.5 mg/dL Final  . GFR 05/29/2014 66.23  >60.00 mL/min Final  . Cholesterol 05/29/2014 116  0 - 200 mg/dL Final   ATP III Classification       Desirable:  < 200 mg/dL               Borderline High:  200 - 239 mg/dL          High:  > = 240 mg/dL  . Triglycerides 05/29/2014 136.0  0.0 - 149.0 mg/dL Final   Normal:  <150 mg/dLBorderline High:  150 - 199 mg/dL  . HDL 05/29/2014 36.70* >39.00 mg/dL Final  . VLDL 05/29/2014 27.2  0.0 - 40.0 mg/dL Final  . LDL Cholesterol 05/29/2014 52  0 - 99 mg/dL Final  . Total CHOL/HDL Ratio 05/29/2014 3   Final                  Men          Women1/2 Average Risk     3.4          3.3Average Risk          5.0          4.42X Average Risk          9.6          7.13X Average Risk          15.0          11.0                      . NonHDL 05/29/2014 79.30   Final   NOTE:  Non-HDL goal should be 30 mg/dL higher than patient's LDL goal (i.e. LDL goal of < 70 mg/dL, would have non-HDL goal of < 100 mg/dL)  . Microalb, Ur 05/29/2014 27.2* 0.0 - 1.9 mg/dL Final  . Creatinine,U 05/29/2014 138.0   Final  . Microalb Creat Ratio 05/29/2014 19.7  0.0 - 30.0 mg/g Final  . Color, Urine 05/29/2014 YELLOW  Yellow;Lt. Yellow Final  . APPearance 05/29/2014 CLEAR  Clear Final  . Specific Gravity, Urine 05/29/2014 1.025  1.000-1.030 Final  . pH 05/29/2014 6.0  5.0 - 8.0 Final  . Total Protein, Urine 05/29/2014 30* Negative Final  . Urine Glucose 05/29/2014 NEGATIVE  Negative Final  . Ketones, ur 05/29/2014 NEGATIVE  Negative Final  . Bilirubin Urine 05/29/2014 NEGATIVE  Negative Final  . Hgb urine dipstick 05/29/2014  NEGATIVE  Negative Final  . Urobilinogen, UA 05/29/2014 0.2  0.0 - 1.0 Final  . Leukocytes, UA 05/29/2014 NEGATIVE  Negative Final  . Nitrite 05/29/2014 NEGATIVE  Negative Final  . WBC, UA 05/29/2014 0-2/hpf  0-2/hpf Final  . Mucus, UA 05/29/2014 Presence of* None  Final      Medication List       This list is accurate as of: 06/02/14 11:32 AM.  Always use your most recent med list.               ACCU-CHEK NANO SMARTVIEW W/DEVICE Kit  Use to check blood sugars 2 times per day dx code 250.02     ACCU-CHEK SMARTVIEW CONTROL Liqd  Use as directed     acetaminophen 500 MG tablet  Commonly known as:  TYLENOL  Take 1,000 mg by mouth daily.     allopurinol 100 MG tablet  Commonly known as:  ZYLOPRIM  TAKE 1 TABLET EVERY DAY     aspirin EC 81 MG tablet  Take 1 tablet (81 mg total) by mouth daily.     atorvastatin 40 MG tablet  Commonly known as:  LIPITOR  TAKE 1 TABLET EVERY DAY     azithromycin 250 MG tablet  Commonly known as:  ZITHROMAX     Bromocriptine Mesylate 0.8 MG Tabs  Commonly known as:  CYCLOSET  Take 6 tablets once a day     cholecalciferol 1000 UNITS tablet  Commonly known as:  VITAMIN D  Take 2,000 Units by mouth daily.     Co-Enzyme Q-10 100 MG Caps  Take 100 mg by mouth daily.     ferrous sulfate 325 (65 FE) MG tablet  Take 325 mg by mouth 2 (two) times daily with a meal.     folic acid 277 MCG tablet  Commonly known as:  FOLVITE  Take 800 mcg by mouth daily.     GLIPIZIDE XL 10 MG 24 hr tablet  Generic drug:  glipiZIDE  TAKE 1 TABLET BY MOUTH DAILY WITH BREAKFAST     glucose blood test strip  Commonly known as:  ACCU-CHEK SMARTVIEW  Use as instructed to check blood sugar 2 times per day dx code 250.02     HYDROcodone-acetaminophen 5-325 MG per tablet  Commonly known as:  NORCO/VICODIN     metFORMIN 1000 MG tablet  Commonly known as:  GLUCOPHAGE  Take 1 tablet (1,000 mg total) by mouth 2 (two) times daily with a meal.     MICROLET LANCETS Misc  USE ONE LANCET TO CHECK GLUCOSE TWICE DAILY AS DIRECTED     miglitol 50 MG tablet  Commonly known as:  GLYSET  Take 1 tablet (50 mg total) by mouth 3 (three) times daily with meals.     nebivolol 5 MG tablet  Commonly known as:  BYSTOLIC  Take 5 mg by  mouth daily.     omega-3 acid ethyl esters 1 G capsule  Commonly known as:  LOVAZA  Take 1 g by mouth daily.     PRESERVISION AREDS 2 Caps  Take 2 capsules by mouth daily.     MEMORY VITE Tabs  Take 1 tablet by mouth daily.     BEROCCA PO  Take 1 tablet by mouth daily.     sitaGLIPtin 100 MG tablet  Commonly known as:  JANUVIA  Take 1 tablet (100 mg total) by mouth daily.     tamsulosin 0.4 MG Caps capsule  Commonly known as:  FLOMAX  Take 1 capsule (0.4 mg total) by mouth daily.     vitamin B-12 500 MCG tablet  Commonly known as:  CYANOCOBALAMIN  Take 1,000 mcg by mouth daily.        Allergies:  Allergies  Allergen Reactions  . Penicillins Anaphylaxis  . Plavix [Clopidogrel Bisulfate] Hives  . Pletal [Cilostazol] Itching    Past Medical History  Diagnosis Date  . Essential hypertension, benign   . Other and unspecified hyperlipidemia   . Chronic kidney disease, stage II (mild)   . Type II or unspecified type diabetes mellitus without mention of complication, uncontrolled   . PAD (peripheral artery disease)   . Orthostasis   . CAD (coronary artery disease)     with IMI and BMS 1997  . Gout     Past Surgical History  Procedure Laterality Date  . Hernia repair    . Cholecystectomy    . Cataract extraction    . Melanoma excision    . Skin lesion excision    . Abdominal aortagram N/A 10/09/2013    Procedure: ABDOMINAL Maxcine Ham;  Surgeon: Wellington Hampshire, MD;  Location: [facility] CATH LAB;  Service: Cardiovascular;  Laterality: N/A;    Family History  Problem Relation Age of Onset  . Colon cancer Father   . Heart disease Mother     Social History:  reports that he has been smoking.  He has never used smokeless tobacco. He reports that he does not drink alcohol or use illicit drugs.  Review of Systems:  Hypertension:  currently only on Bystolic 28m, also followed by cardiologist Does not monitor at home  Lipids: Well controlled with atorvastatin.  HDL is  however low Not on any fenofibrate currently   Lab Results  Component Value Date   CHOL 116 05/29/2014   HDL 36.70* 05/29/2014   LDLCALC 52 05/29/2014   TRIG 136.0 05/29/2014   CHOLHDL 3 05/29/2014   No history of chest pain on exertion  Foot exam done in 3/15, has callus formation and mild sensory loss as well as decreased pulses  History of BPH present, taking Flomax   Examination:   BP 145/66 mmHg  Pulse 68  Temp(Src) 98.3 F (36.8 C)  Resp 16  Ht 6' 2"  (1.88 m)  Wt 178 lb 9.6 oz (81.012 kg)  BMI 22.92 kg/m2  SpO2 97%  Body mass index is 22.92 kg/(m^2).    ASSESSMENT/ PLAN:   Diabetes type 2   Blood glucose control is relatively worse with A1c over 8% now This is despite his fasting glucose averaging around 145 and most likely has high postprandial readings Also has discussed in history of present illness he is not planning his meals well and has little knowledge of diabetic diet Discussed that he may be a candidate for mealtime insulin or also switching Januvia to a GLP-1 drug Since currently his insurance is paying for Cycloset he is willing to take as much as 6 tablets a day in the morning before changing his treatment regimen Recently renal function is adequate and will not require adjustment of his doses  No hypoglycemia with glipizide at this time He will see the dietitian in consultation  Patient Instructions  Go up to 5 Cycloset in ams  Please check blood sugars at least half the time about 2 hours after any meal and 2 times per week on waking up. Please bring blood sugar monitor to each visit  Call if sugars after meals frequently  Reduce carbs at  breakfast    HYPERLIPIDEMIA: His LDL is well-controlled but HDL still relatively low despite fairly good exercise regimen    History of renal insufficiency: Appears resolved  Counseling time over 50% of today's 25 minute visit  Himani Corona 06/02/2014, 11:32 AM

## 2014-06-09 ENCOUNTER — Encounter: Payer: Self-pay | Admitting: Endocrinology

## 2014-06-13 ENCOUNTER — Other Ambulatory Visit: Payer: Self-pay | Admitting: Endocrinology

## 2014-07-04 ENCOUNTER — Ambulatory Visit: Payer: Commercial Managed Care - HMO | Admitting: *Deleted

## 2014-07-24 ENCOUNTER — Encounter: Payer: Self-pay | Admitting: *Deleted

## 2014-07-24 ENCOUNTER — Encounter: Payer: Commercial Managed Care - HMO | Attending: Internal Medicine | Admitting: *Deleted

## 2014-07-24 VITALS — Ht 74.0 in | Wt 169.7 lb

## 2014-07-24 DIAGNOSIS — Z713 Dietary counseling and surveillance: Secondary | ICD-10-CM | POA: Insufficient documentation

## 2014-07-24 DIAGNOSIS — E118 Type 2 diabetes mellitus with unspecified complications: Secondary | ICD-10-CM | POA: Insufficient documentation

## 2014-07-24 NOTE — Patient Instructions (Signed)
Plan:  Aim for 3-4 Carb Choices per meal (45-60 grams)  Aim for 0-2 Carbs per snack if hungry  Include protein in moderation with your meals and snacks Consider reading food labels for Total Carbohydrate of foods Continue with your activity level daily as tolerated Consider checking BG at alternate times per day as directed by MD  Continue taking medications as directed by MD

## 2014-07-24 NOTE — Progress Notes (Signed)
  Medical Nutrition Therapy:  Appt start time: 1400 end time:  1410.  Assessment:  Primary concerns today: 07/24/2014. States he was diagnosed in 1989. Lives with wife, he shops and she does the cooking. He goes to the gyum at least once a week, he plays tennis and has started playing pickle ball. In the summer he enjoys swimming every day. 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 He states he SMBG daily, usually once. He doesn't have a set eating schedule which affects when he tests. His FBG range from 95-145 mg/dl.   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  Estimated energy needs: 1600 calories 180 g carbohydrates 120 g protein 44 g fat  Progress Towards Goal(s):  In progress.   Nutritional Diagnosis:  NB-1.1 Food and nutrition-related knowledge deficit As related to Diabetes.  As evidenced by A1c of 8.1%    Intervention:  Nutrition counseling and diabetes education initiated. Discussed Carb Counting by food group as method of portion control, reading  food labels, and benefits of increased activity. Also discussed basic physiology of Diabetes, target BG ranges pre and post meals, and A1c.   Plan:  Aim for 3-4 Carb Choices per meal (45-60 grams)  Aim for 0-2 Carbs per snack if hungry  Include protein in moderation with your meals and snacks Consider reading food labels for Total Carbohydrate of foods Continue with your activity level daily as tolerated Consider checking BG at alternate times per day as directed by MD  Continue taking medications as directed by MD  Teaching Method Utilized: Visual, Auditory and Hands on  Handouts given during visit include: Living Well with Diabetes Carb Counting and Food Label handouts Meal Plan Card  Barriers to learning/adherence to lifestyle change: none  Demonstrated degree of understanding via:  Teach Back   Monitoring/Evaluation:  Dietary intake, exercise, reading food labels, and body weight prn.

## 2014-08-18 ENCOUNTER — Other Ambulatory Visit: Payer: Self-pay | Admitting: Endocrinology

## 2014-08-28 ENCOUNTER — Other Ambulatory Visit (INDEPENDENT_AMBULATORY_CARE_PROVIDER_SITE_OTHER): Payer: Commercial Managed Care - HMO

## 2014-08-28 DIAGNOSIS — E1165 Type 2 diabetes mellitus with hyperglycemia: Secondary | ICD-10-CM

## 2014-08-28 DIAGNOSIS — IMO0002 Reserved for concepts with insufficient information to code with codable children: Secondary | ICD-10-CM

## 2014-08-28 LAB — BASIC METABOLIC PANEL
BUN: 20 mg/dL (ref 6–23)
CO2: 24 mEq/L (ref 19–32)
Calcium: 9.3 mg/dL (ref 8.4–10.5)
Chloride: 108 mEq/L (ref 96–112)
Creatinine, Ser: 1.14 mg/dL (ref 0.40–1.50)
GFR: 66.85 mL/min (ref >60.00)
Glucose, Bld: 178 mg/dL — ABNORMAL HIGH (ref 70–99)
Potassium: 4.3 mEq/L (ref 3.5–5.1)
Sodium: 138 mEq/L (ref 135–145)

## 2014-08-28 LAB — HEMOGLOBIN A1C: Hgb A1c MFr Bld: 7.8 % — ABNORMAL HIGH (ref 4.6–6.5)

## 2014-08-29 ENCOUNTER — Ambulatory Visit (INDEPENDENT_AMBULATORY_CARE_PROVIDER_SITE_OTHER): Payer: Commercial Managed Care - HMO | Admitting: Interventional Cardiology

## 2014-08-29 ENCOUNTER — Encounter: Payer: Self-pay | Admitting: Interventional Cardiology

## 2014-08-29 VITALS — BP 100/60 | HR 67 | Ht 74.0 in | Wt 171.8 lb

## 2014-08-29 DIAGNOSIS — I2510 Atherosclerotic heart disease of native coronary artery without angina pectoris: Secondary | ICD-10-CM

## 2014-08-29 DIAGNOSIS — Z720 Tobacco use: Secondary | ICD-10-CM

## 2014-08-29 DIAGNOSIS — E785 Hyperlipidemia, unspecified: Secondary | ICD-10-CM

## 2014-08-29 DIAGNOSIS — I10 Essential (primary) hypertension: Secondary | ICD-10-CM | POA: Diagnosis not present

## 2014-08-29 DIAGNOSIS — I739 Peripheral vascular disease, unspecified: Secondary | ICD-10-CM | POA: Diagnosis not present

## 2014-08-29 NOTE — Patient Instructions (Addendum)
Your physician recommends that you continue on your current medications as directed. Please refer to the Current Medication list given to you today.  Your physician has requested that you have a lexiscan myoview. For further information please visit [email or web address]. Please follow instruction sheet, as given.   Your physician wants you to follow-up in: 1 year with Dr.Smith You will receive a reminder letter in the mail two months in advance. If you don't receive a letter, please call our office to schedule the follow-up appointment.

## 2014-08-29 NOTE — Progress Notes (Addendum)
Cardiology Office Note   Date:  08/29/2014   ID:  Janace Litten, DOB Jul 29, 1940, MRN [id number]  PCP:  Kandice Hams, MD  Cardiologist:   Sinclair Grooms, MD   No chief complaint on file.     History of Present Illness: Mario [name] is a 74 y.o. male who presents for CAD. He underwent angioplasty with bare metal stents doing an inferior infarct in 1997. He's had no recurrence of angina. 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 This is relatively new. He denies dyspnea at rest. There is no orthopnea.    Past Medical History  Diagnosis Date  . Essential hypertension, benign   . Other and unspecified hyperlipidemia   . Chronic kidney disease, stage II (mild)   . Type II or unspecified type diabetes mellitus without mention of complication, uncontrolled   . PAD (peripheral artery disease)   . Orthostasis   . CAD (coronary artery disease)     with IMI and BMS 1997  . Gout     Past Surgical History  Procedure Laterality Date  . Hernia repair    . Cholecystectomy    . Cataract extraction    . Melanoma excision    . Skin lesion excision    . Abdominal aortagram N/A 10/09/2013    Procedure: ABDOMINAL Maxcine Ham;  Surgeon: Wellington Hampshire, MD;  Location: [facility] CATH LAB;  Service: Cardiovascular;  Laterality: N/A;     Current Outpatient Prescriptions  Medication Sig Dispense Refill  . acetaminophen (TYLENOL) 500 MG tablet Take 1,000 mg by mouth daily.    Marland Kitchen allopurinol (ZYLOPRIM) 100 MG tablet TAKE 1 TABLET EVERY DAY 90 tablet 1  . aspirin EC 81 MG tablet Take 1 tablet (81 mg total) by mouth daily. 1 tablet 0  . atorvastatin (LIPITOR) 40 MG tablet TAKE 1 TABLET EVERY DAY 90 tablet 1  . Blood Glucose Calibration (ACCU-CHEK SMARTVIEW CONTROL) LIQD Use as directed 1 each 1  . Blood Glucose Monitoring Suppl (ACCU-CHEK NANO SMARTVIEW) W/DEVICE KIT Use to check blood sugars 2 times per day dx code 250.02 1 kit 1  . cholecalciferol (VITAMIN  D) 1000 UNITS tablet Take 2,000 Units by mouth daily.    Marland Kitchen Co-Enzyme Q-10 100 MG CAPS Take 100 mg by mouth daily.    . CYCLOSET 0.8 MG TABS TAKE 6 TABLETS ONCE A DAY 540 tablet 1  . ferrous sulfate 325 (65 FE) MG tablet Take 325 mg by mouth 2 (two) times daily with a meal.    . folic acid (FOLVITE) 563 MCG tablet Take 800 mcg by mouth daily.    Marland Kitchen GLIPIZIDE XL 10 MG 24 hr tablet TAKE 1 TABLET BY MOUTH DAILY WITH BREAKFAST 90 tablet 1  . glucose blood (ACCU-CHEK SMARTVIEW) test strip Use as instructed to check blood sugar 2 times per day dx code 250.02 200 each 1  . metFORMIN (GLUCOPHAGE) 1000 MG tablet Take 1 tablet (1,000 mg total) by mouth 2 (two) times daily with a meal. 180 tablet 3  . MICROLET LANCETS MISC USE ONE LANCET TO CHECK GLUCOSE TWICE DAILY AS DIRECTED 100 each 3  . miglitol (GLYSET) 50 MG tablet Take 1 tablet (50 mg total) by mouth 3 (three) times daily with meals. (Patient taking differently: Take 50 mg by mouth 2 (two) times daily. ) 90 tablet 5  . Multiple Vitamins-Minerals (MEMORY VITE) TABS Take 1 tablet by mouth daily.    . Multiple Vitamins-Minerals (PRESERVISION AREDS 2)  CAPS Take 2 capsules by mouth daily.    . nebivolol (BYSTOLIC) 5 MG tablet Take 5 mg by mouth daily.    Marland Kitchen omega-3 acid ethyl esters (LOVAZA) 1 G capsule Take 1 g by mouth daily.    . sitaGLIPtin (JANUVIA) 100 MG tablet Take 1 tablet (100 mg total) by mouth daily. 30 tablet 5  . tamsulosin (FLOMAX) 0.4 MG CAPS capsule Take 1 capsule (0.4 mg total) by mouth daily. 30 capsule 0  . vitamin B-12 (CYANOCOBALAMIN) 500 MCG tablet Take 1,000 mcg by mouth daily.     No current facility-administered medications for this visit.    Allergies:   Penicillins; Plavix; and Pletal    Social History:  The patient  reports that he has been smoking.  He has never used smokeless tobacco. He reports that he does not drink alcohol or use illicit drugs.   Family History:  The patient's family history includes Colon cancer in  his father; Heart disease in his mother.    ROS:  Please see the history of present illness.   Otherwise, review of systems are positive for claudication and limits heavy physical activity and rapid walking.   All other systems are reviewed and negative.    PHYSICAL EXAM: VS:  BP 100/60 mmHg  Pulse 67  Ht 6' 2"  (1.88 m)  Wt 171 lb 12.8 oz (77.928 kg)  BMI 22.05 kg/m2 , BMI Body mass index is 22.05 kg/(m^2). GEN: Well nourished, well developed, in no acute distress HEENT: normal Neck: no JVD, carotid bruits, or masses Cardiac: RRR; no murmurs, rubs, or gallops,no edema  Respiratory:  clear to auscultation bilaterally, normal work of breathing GI: soft, nontender, nondistended, + BS MS: no deformity or atrophy Skin: warm and dry, no rash Neuro:  Strength and sensation are intact Psych: euthymic mood, full affect   EKG:  EKG is ordered today. The ekg ordered today demonstrates normal sinus rhythm with first-degree AV block and inferior T-wave abnormality. The T-wave abnormality extends to the lateral precordial leads. Compared to the most recent EKG from a year ago, the T-wave abnormality is new.   Recent Labs: 10/01/2013: Hemoglobin 11.3*; Platelets 148.0* 05/29/2014: ALT 43 08/28/2014: BUN 20; Creatinine 1.14; Potassium 4.3; Sodium 138    Lipid Panel    Component Value Date/Time   CHOL 116 05/29/2014 0950   TRIG 136.0 05/29/2014 0950   HDL 36.70* 05/29/2014 0950   CHOLHDL 3 05/29/2014 0950   VLDL 27.2 05/29/2014 0950   LDLCALC 52 05/29/2014 0950      Wt Readings from Last 3 Encounters:  08/29/14 171 lb 12.8 oz (77.928 kg)  07/24/14 169 lb 11.2 oz (76.975 kg)  06/02/14 178 lb 9.6 oz (81.012 kg)      Other studies Reviewed: Additional studies/ records that were reviewed today include: Reviewed are clinical data. Review of the above records demonstrates: No cardiac catheterizations are not system. They wall Mario [name] in [place]   ASSESSMENT AND  PLAN:  Coronary artery disease involving native coronary artery of native heart without angina pectoris: ECG demonstrates inferolateral T-wave abnormality that is new. He denies chest discomfort but is having dyspnea.  Exertional dyspnea: Possibly representing an anginal equivalent  Essential hypertension, benign: Controlled  Tobacco use  PAD (peripheral artery disease): Moderate limitation in physical activity  Hyperlipidemia: On therapy and managed by primary care     Current medicines are reviewed at length with the patient today.  The patient does not have concerns regarding  medicines.  The following changes have been made:  Because of the new inferior lateral T wave abnormality, and exertional dyspnea, he needs to have evaluation for ischemia to exclude the possibility of  Labs/ tests ordered today include: Lexiscan pharmacologic Myoview   Orders Placed This Encounter  Procedures  . Myocardial Perfusion Imaging  . Myocardial Perfusion Imaging  . EKG 12-Lead     Disposition:   FU with Mario [name] in 12 months   Signed, Sinclair Grooms, MD  08/29/2014 1:02 [facility] [place], [place], [place]  [address] Phone: [phone]; Fax: [phone]

## 2014-09-01 ENCOUNTER — Ambulatory Visit (INDEPENDENT_AMBULATORY_CARE_PROVIDER_SITE_OTHER): Payer: Commercial Managed Care - HMO | Admitting: Endocrinology

## 2014-09-01 ENCOUNTER — Encounter: Payer: Self-pay | Admitting: Endocrinology

## 2014-09-01 VITALS — BP 98/58 | HR 66 | Temp 97.4°F | Ht 74.0 in | Wt 172.0 lb

## 2014-09-01 DIAGNOSIS — I951 Orthostatic hypotension: Secondary | ICD-10-CM | POA: Diagnosis not present

## 2014-09-01 DIAGNOSIS — E1165 Type 2 diabetes mellitus with hyperglycemia: Secondary | ICD-10-CM | POA: Diagnosis not present

## 2014-09-01 DIAGNOSIS — IMO0002 Reserved for concepts with insufficient information to code with codable children: Secondary | ICD-10-CM

## 2014-09-01 DIAGNOSIS — E785 Hyperlipidemia, unspecified: Secondary | ICD-10-CM | POA: Diagnosis not present

## 2014-09-01 MED ORDER — NEBIVOLOL HCL 2.5 MG PO TABS: 2.5000 mg | ORAL_TABLET | Freq: Every day | ORAL | Status: DC

## 2014-09-01 NOTE — Patient Instructions (Addendum)
Take 6 Cycloset in am  More sugars 2 hrs after supper, less in ams  Eat cereal less often  Please check blood sugars at least half the time about 2 hours after any meal and 3 times per week on waking up.  Please bring blood sugar monitor to each visit.  Recommended blood sugar levels about 2 hours after meal is 140-180 and on waking up 51-700  Reduce Bystolic to 2.5mg  daily

## 2014-09-01 NOTE — Progress Notes (Signed)
Pre visit review using our clinic review tool, if applicable. No additional management support is needed unless otherwise documented below in the visit note. 

## 2014-09-01 NOTE — Progress Notes (Signed)
Patient ID: Mario [name], male   DOB: [birth year], 74 y.o.   MRN: [id number]   Reason for Appointment: Diabetes follow-up   History of Present Illness   Diagnosis: Type 2 DIABETES MELITUS, date of diagnosis:  1989     Previous history: He was initially diagnosed when hospitalized for pancreatitis and his previous endocrinologist had treated him with multiple medications because of progression of his diabetes. He was also put on Cycloset  which he has tolerated maximum dose He has had adjustments of his medication dosages the last couple of years and Januvia was restarted in 5/14 when blood sugars were higher. Dosages have been adjusted based on renal function Previously an A1c levels have been ranging from 6.6-7.5 His metformin was increased  when renal function was better and glipizide was changed to glipizide ER 10 mg. Did not appear to have better blood sugar control with Invokana  Recent history:  His A1c is slightly better since his last visit He was asked to increase his Cycloset to 6 tablets a day in the morning but he is only taking 4, previously was taking 3-4 tablets Also he has seen the dietitian in 3/16 and is trying to make some changes in his diet However still getting cereal in the morning frequently for breakfast even though he is eating some bacon with this Again he has not checked his readings AFTER evening meal as discussed on each visit His meal timings are very irregular and he does not eat his breakfast or lunch on time and generally eating only one meal before his evening meal He has however started exercising more regularly  Blood sugar patterns: FASTING blood sugars are a little variable but generally near target with an average of about 130 Blood sugars after his breakfast or lunch are quite variable and he thinks he is doing most of his readings about 2 hours after eating; blood sugar ranges anywhere between 115 and 254 He is fairly compliant with  taking his multidrug regimen Occasionally may forget to take Glyset at the start of the meal Still on maximum dose metformin and Januvia since renal function has been stable in the normal range  Oral hypoglycemic drugs: Januvia, Glyset, glipizide er 10 , Metformin 1 g twice a day,Cycloset        Side effects from medications: None Proper timing of medications in relation to meals: usually  Monitors blood glucose: Once a day.    Glucometer:   Accu-Chek         Blood Glucose readings from meter download:  PRE-MEAL Breakfast pc Bf/ Lunch Dinner Bedtime Overall  Glucose range: 99--155  115-254     96-254   Mean/median:  130   192    143   Hypoglycemia: None recently      Meals: 2-3 meals per day, no lunch often; breakfast is cereal, [place] bacon, sometimes bread and juice at breakfast     Dietician 3/16       Physical activity: exercise:  going to the gym or tennis 3-4/7.  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 Hgb A1c MFr Bld 08/28/2014 7.8* 4.6 - 6.5 % Final   Glycemic Control Guidelines for People with Diabetes:Non Diabetic:  <6%Goal of Therapy: <7%Additional Action Suggested:  >8%   . Sodium 08/28/2014 138  135 - 145 mEq/L Final  . Potassium 08/28/2014 4.3  3.5 - 5.1 mEq/L Final  . Chloride 08/28/2014 108  96 - 112 mEq/L Final  . CO2 08/28/2014 24  19 - 32 mEq/L Final  . Glucose, Bld 08/28/2014 178* 70 - 99 mg/dL Final  . BUN 08/28/2014 20  6 - 23 mg/dL Final  . Creatinine, Ser 08/28/2014  1.14  0.40 - 1.50 mg/dL Final  . Calcium 08/28/2014 9.3  8.4 - 10.5 mg/dL Final  . GFR 08/28/2014 66.85  >60.00 mL/min Final      Medication List       This list is accurate as of: 09/01/14  2:16 PM.  Always use your most recent med list.               ACCU-CHEK NANO SMARTVIEW W/DEVICE Kit  Use to check blood sugars 2 times per day dx code 250.02     ACCU-CHEK SMARTVIEW CONTROL Liqd  Use as directed     acetaminophen 500 MG tablet  Commonly known as:  TYLENOL  Take 1,000 mg by mouth daily.     allopurinol 100 MG tablet  Commonly known as:  ZYLOPRIM  TAKE 1 TABLET EVERY DAY     aspirin EC 81 MG tablet  Take 1 tablet (81 mg total) by mouth daily.     atorvastatin 40 MG tablet  Commonly known as:  LIPITOR  TAKE 1 TABLET EVERY DAY     cholecalciferol 1000 UNITS tablet  Commonly known as:  VITAMIN D  Take 2,000 Units by mouth daily.     Co-Enzyme Q-10 100 MG Caps  Take 100 mg by mouth daily.     CYCLOSET 0.8 MG Tabs  Generic drug:  Bromocriptine Mesylate  TAKE 6 TABLETS ONCE A DAY     ferrous sulfate 325 (65 FE) MG tablet  Take 325 mg by mouth 2 (two) times daily with a meal.     folic acid 564 MCG tablet  Commonly known as:  FOLVITE  Take 800 mcg by mouth daily.     GLIPIZIDE XL 10 MG 24 hr tablet  Generic drug:  glipiZIDE  TAKE 1 TABLET BY MOUTH DAILY WITH BREAKFAST     glucose blood test strip  Commonly known as:  ACCU-CHEK SMARTVIEW  Use as instructed to check blood sugar 2 times per day dx code 250.02     metFORMIN 1000 MG tablet  Commonly known as:  GLUCOPHAGE  Take 1 tablet (1,000 mg total) by mouth 2 (two) times daily with a meal.     MICROLET LANCETS Misc  USE ONE LANCET TO CHECK GLUCOSE TWICE DAILY AS DIRECTED     miglitol 50 MG tablet  Commonly known as:  GLYSET  Take 1 tablet (50 mg total) by mouth 3 (three) times daily with meals.     nebivolol 2.5 MG tablet  Commonly known as:  BYSTOLIC  Take 1 tablet (2.5 mg total) by mouth daily.      omega-3 acid ethyl esters 1 G capsule  Commonly known  as:  LOVAZA  Take 1 g by mouth daily.     PRESERVISION AREDS 2 Caps  Take 2 capsules by mouth daily.     MEMORY VITE Tabs  Take 1 tablet by mouth daily.     sitaGLIPtin 100 MG tablet  Commonly known as:  JANUVIA  Take 1 tablet (100 mg total) by mouth daily.     tamsulosin 0.4 MG Caps capsule  Commonly known as:  FLOMAX  Take 1 capsule (0.4 mg total) by mouth daily.     vitamin B-12 500 MCG tablet  Commonly known as:  CYANOCOBALAMIN  Take 1,000 mcg by mouth daily.        Allergies:  Allergies  Allergen Reactions  . Penicillins Anaphylaxis  . Plavix [Clopidogrel Bisulfate] Hives  . Pletal [Cilostazol] Itching    Past Medical History  Diagnosis Date  . Essential hypertension, benign   . Other and unspecified hyperlipidemia   . Chronic kidney disease, stage II (mild)   . Type II or unspecified type diabetes mellitus without mention of complication, uncontrolled   . PAD (peripheral artery disease)   . Orthostasis   . CAD (coronary artery disease)     with IMI and BMS 1997  . Gout     Past Surgical History  Procedure Laterality Date  . Hernia repair    . Cholecystectomy    . Cataract extraction    . Melanoma excision    . Skin lesion excision    . Abdominal aortagram N/A 10/09/2013    Procedure: ABDOMINAL Maxcine Ham;  Surgeon: Wellington Hampshire, MD;  Location: [facility] CATH LAB;  Service: Cardiovascular;  Laterality: N/A;    Family History  Problem Relation Age of Onset  . Colon cancer Father   . Heart disease Mother     Social History:  reports that he has been smoking.  He has never used smokeless tobacco. He reports that he does not drink alcohol or use illicit drugs.  Review of Systems:  Hypertension:  currently only on Bystolic 71m, also followed by cardiologist He is getting samples of either 5 mg or 10 mg from his PCP Does not monitor at home He will get lightheaded if he has been sitting for long  or in the morning getting out of bed. Blood pressure with cardiologist was only 100/60 He also takes Flomax  Lipids: Well controlled with atorvastatin.  HDL is however persistently low Not on any fenofibrate currently and triglycerides are normal   Lab Results  Component Value Date   CHOL 116 05/29/2014   HDL 36.70* 05/29/2014   LDLCALC 52 05/29/2014   TRIG 136.0 05/29/2014   CHOLHDL 3 05/29/2014   No history of chest pain on exertion, is going to get a stress test because of some history of dyspnea on exertion, however he is still able to exercise  Foot exam done in 3/15, has callus formation and mild sensory loss as well as decreased pulses  History of BPH present, taking Flomax   Examination:   BP 98/58 mmHg  Pulse 66  Temp(Src) 97.4 F (36.3 C) (Oral)  Ht 6' 2"  (1.88 m)  Wt 172 lb (78.019 kg)  BMI 22.07 kg/m2  SpO2 99%  Body mass index is 22.07 kg/(m^2).    ASSESSMENT/ PLAN:   Diabetes type 2   Blood glucose control is relatively better with A1c just under 8% Since his fasting blood sugars are only occasionally higher he likely has frequent  postprandial hyperglycemia He has several readings  over 190 after meals during the day but not clear what his readings are after his evening meal This is despite his trying to exercise regularly and also starting to modify his diet after talking to the dietitian  Recommendations made: Will increase his Cycloset to 6 tablets if tolerated, he can start with 5 tablets tomorrow.  Discussed taking this within the first 2 hours of waking up Start monitoring more regularly after meals rather than the morning Discussed adding protein to breakfast daily and avoiding cereal in the morning Call if blood sugars are consistently high; may consider a GLP-1 drug instead of Januvia  Orthostatic HYPOTENSION: Blood pressure is relatively low especially standing up and he will reduce his Bystolic to 2.5 mg for now This has been prescribed by  cardiologist However this is rather expensive and he prefers a less expensive medication Will forward the notes to his cardiologist also   HYPERLIPIDEMIA: His LDL is well-controlled but HDL still relatively low despite fairly good exercise regimen    History of renal insufficiency: No recurrence   Patient Instructions  Take 6 Cycloset in am  More sugars 2 hrs after supper, less in ams  Eat cereal less often  Please check blood sugars at least half the time about 2 hours after any meal and 3 times per week on waking up.  Please bring blood sugar monitor to each visit.  Recommended blood sugar levels about 2 hours after meal is 140-180 and on waking up 36-067  Reduce Bystolic to 7.0HE daily      Counseling time over 50% of today's 25 minute visit  Markeya Mincy 09/01/2014, 2:16 PM

## 2014-09-09 ENCOUNTER — Ambulatory Visit (HOSPITAL_COMMUNITY): Payer: Commercial Managed Care - HMO | Attending: Cardiology | Admitting: Radiology

## 2014-09-09 DIAGNOSIS — I10 Essential (primary) hypertension: Secondary | ICD-10-CM | POA: Diagnosis not present

## 2014-09-09 DIAGNOSIS — R0609 Other forms of dyspnea: Secondary | ICD-10-CM | POA: Insufficient documentation

## 2014-09-09 DIAGNOSIS — R9431 Abnormal electrocardiogram [ECG] [EKG]: Secondary | ICD-10-CM | POA: Insufficient documentation

## 2014-09-09 DIAGNOSIS — I739 Peripheral vascular disease, unspecified: Secondary | ICD-10-CM | POA: Diagnosis not present

## 2014-09-09 DIAGNOSIS — E119 Type 2 diabetes mellitus without complications: Secondary | ICD-10-CM | POA: Insufficient documentation

## 2014-09-09 DIAGNOSIS — I2510 Atherosclerotic heart disease of native coronary artery without angina pectoris: Secondary | ICD-10-CM | POA: Diagnosis not present

## 2014-09-09 MED ORDER — TECHNETIUM TC 99M SESTAMIBI GENERIC - CARDIOLITE
33.0000 | Freq: Once | INTRAVENOUS | Status: AC | PRN
  Administered 2014-09-09: 33 via INTRAVENOUS

## 2014-09-09 MED ORDER — REGADENOSON 0.4 MG/5ML IV SOLN
0.4000 mg | Freq: Once | INTRAVENOUS | Status: AC
  Administered 2014-09-09: 0.4 mg via INTRAVENOUS

## 2014-09-09 MED ORDER — TECHNETIUM TC 99M SESTAMIBI GENERIC - CARDIOLITE
11.0000 | Freq: Once | INTRAVENOUS | Status: AC | PRN
  Administered 2014-09-09: 11 via INTRAVENOUS

## 2014-09-09 NOTE — Progress Notes (Signed)
[facility] [address] [place], [place] [address] [phone]    Cardiology Nuclear Med Study  Mario [name] is a 74 y.o. male     MRN : [id number]     DOB: November 01, 1940  Procedure Date: 09/09/2014  Nuclear Med Background Indication for Stress Test:  Evaluation for Ischemia, Stent Patency and Abnormal EKG History:  CAD, MPI ~3 yrs ago, Asthma Cardiac Risk Factors: Carotid Disease, Hypertension, NIDDM and PAD  Symptoms:  DOE   Nuclear Pre-Procedure Caffeine/Decaff Intake:  None> 12 hrs NPO After: 7:30pm   Lungs:  clear O2 Sat: 98% on room air. IV 0.9% NS with Angio Cath:  22g  IV Site: L Antecubital x 1, tolerated well IV Started by:  Irven Baltimore, RN  Chest Size (in):  42 Cup Size: n/a  Height: '6\' 2"'$  (1.88 m)  Weight:  167 lb (75.751 kg)  BMI:  Body mass index is 21.43 kg/(m^2). Tech Comments:  Patient did not take any medications this am. Irven Baltimore, RN.    Nuclear Med Study 1 or 2 day study: 1 day  Stress Test Type:  Treadmill/Lexiscan  Reading MD: N/A  Order Authorizing Provider:  [name], III, MD  Resting Radionuclide: Technetium 62mSestamibi  Resting Radionuclide Dose: 11.0 mCi   Stress Radionuclide:  Technetium 944mestamibi  Stress Radionuclide Dose: 33.0 mCi           Stress Protocol Rest HR: 61 Stress HR: 86  Rest BP: 141/67 Stress BP: 136/62  Exercise Time (min): n/a METS: n/a   Predicted Max HR: 147 bpm % Max HR: 58.5 bpm Rate Pressure Product: 12900   Dose of Adenosine (mg):  n/a Dose of Lexiscan: 0.4 mg  Dose of Atropine (mg): n/a Dose of Dobutamine: n/a mcg/kg/min (at max HR)  Stress Test Technologist: ShGlade LloydBS-ES  Nuclear Technologist:  ElEarl ManyCNMT     Rest Procedure:  Myocardial perfusion imaging was performed at rest 45 minutes following the intravenous administration of Technetium 998mstamibi. Rest ECG: SR, STD and negative T waves in the inferolateral leads.   Stress Procedure:  The  patient received IV Lexiscan 0.4 mg over 15-seconds with concurrent low level exercise and then Technetium 32m28mtamibi was injected at 30-seconds while the patient continued walking one more minute.  Quantitative spect images were obtained after a 45-minute delay.  During the infusion of Lexiscan the patient complained of SOB and chest tightness.  These symptoms resolved in recovery.  Stress ECG: No significant change from baseline ECG  QPS Raw Data Images:  There is interference from nuclear activity from structures below the diaphragm. This does not affect the ability to read the study. Stress Images:  There is decreased uptake in the basal and mid inferoseptal, inferior, inferolateral and basal anterolateral walls.  Rest Images:  There is decreased uptake in the basal and mid inferior, inferolateral and basal inferoseptal walls.  Subtraction (SDS):  2 Transient Ischemic Dilatation (Normal <1.22):  1.24 Lung/Heart Ratio (Normal <0.45):  0.29  Quantitative Gated Spect Images QGS EDV:  172 ml QGS ESV:  113 ml  Impression Exercise Capacity:  Lexiscan with low level exercise. BP Response:  Normal blood pressure response. Clinical Symptoms:  Mild chest pain/dyspnea. ECG Impression:  No significant ST segment change suggestive of ischemia. Comparison with Prior Nuclear Study: No images to compare  Overall Impression:  High risk stress nuclear study with a large scar in the RCA territory (5 segments) with minimal  periinfarct ischemia in the mid inferoseptal and apical inferior walls (SDS 2). .  LV Ejection Fraction: 34%.  LV Wall Motion:  Moderately decreased LV systolic function with akinesis in the basal and mid inferolateral and inferior walls and in the basal inferoseptal wall.    Dorothy Spark 09/09/2014

## 2014-09-11 ENCOUNTER — Telehealth: Payer: Self-pay

## 2014-09-11 MED ORDER — NITROGLYCERIN 0.4 MG SL SUBL: 0.4000 mg | SUBLINGUAL_TABLET | SUBLINGUAL | Status: DC | PRN

## 2014-09-11 MED ORDER — CARVEDILOL 3.125 MG PO TABS: 3.1250 mg | ORAL_TABLET | Freq: Two times a day (BID) | ORAL | Status: DC

## 2014-09-11 NOTE — Telephone Encounter (Signed)
Pt aware of lexiscan results. The study is abnormal. 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 The heart is weak with an ejection fraction of 37% (should be greater than 50%). Switch Bystolic to Carvedilol 4.982 mg twice a day. Needs follow-up office visit 2 weeks after change. These have basic metabolic panel and BNP performed at next visit. At that time we will consider adding Ace/ARB therapy  Rx sent to pt pharmacy, f/u appt scheduled with Dr.Smith on 5/3 Pt adv to seek emergent care for prolonged CP not relieved with Nitro Pt verbalized understanding.

## 2014-09-11 NOTE — Telephone Encounter (Signed)
-----   Message from Belva Crome, MD sent at 09/10/2014  6:07 PM EDT ----- The study is abnormal. 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 The heart is weak with an ejection fraction of 37% (should be greater than 50%).  Switch Bystolic to  Carvedilol 3.810 mg twice a day. Needs follow-up office visit 2 weeks after change. These have basic metabolic panel and BNP performed at next visit. At that time we will consider adding Ace/ARB therapy

## 2014-09-15 ENCOUNTER — Other Ambulatory Visit: Payer: Self-pay | Admitting: *Deleted

## 2014-09-15 MED ORDER — MICROLET LANCETS MISC: Status: DC

## 2014-09-15 MED ORDER — GLUCOSE BLOOD VI STRP: ORAL_STRIP | Status: DC

## 2014-09-16 ENCOUNTER — Other Ambulatory Visit: Payer: Self-pay | Admitting: *Deleted

## 2014-09-16 MED ORDER — METFORMIN HCL 1000 MG PO TABS: 1000.0000 mg | ORAL_TABLET | Freq: Two times a day (BID) | ORAL | Status: DC

## 2014-09-23 ENCOUNTER — Encounter: Payer: Self-pay | Admitting: Interventional Cardiology

## 2014-09-23 ENCOUNTER — Ambulatory Visit
Admission: RE | Admit: 2014-09-23 | Discharge: 2014-09-23 | Disposition: A | Discharge status: Home / Self Care | Payer: Commercial Managed Care - HMO | Source: Ambulatory Visit | Attending: Interventional Cardiology | Admitting: Interventional Cardiology

## 2014-09-23 ENCOUNTER — Ambulatory Visit (INDEPENDENT_AMBULATORY_CARE_PROVIDER_SITE_OTHER): Payer: Commercial Managed Care - HMO | Admitting: Interventional Cardiology

## 2014-09-23 VITALS — BP 112/50 | HR 55 | Ht 74.0 in | Wt 169.1 lb

## 2014-09-23 DIAGNOSIS — I10 Essential (primary) hypertension: Secondary | ICD-10-CM

## 2014-09-23 DIAGNOSIS — Z720 Tobacco use: Secondary | ICD-10-CM

## 2014-09-23 DIAGNOSIS — I2510 Atherosclerotic heart disease of native coronary artery without angina pectoris: Secondary | ICD-10-CM | POA: Diagnosis not present

## 2014-09-23 DIAGNOSIS — E785 Hyperlipidemia, unspecified: Secondary | ICD-10-CM | POA: Diagnosis not present

## 2014-09-23 DIAGNOSIS — I739 Peripheral vascular disease, unspecified: Secondary | ICD-10-CM | POA: Diagnosis not present

## 2014-09-23 LAB — CBC WITH DIFFERENTIAL/PLATELET
Basophils Absolute: 0 10*3/uL (ref 0.0–0.1)
Basophils Relative: 0.3 % (ref 0.0–3.0)
Eosinophils Absolute: 0.2 10*3/uL (ref 0.0–0.7)
Eosinophils Relative: 2.5 % (ref 0.0–5.0)
HCT: 35.5 % — ABNORMAL LOW (ref 39.0–52.0)
Hemoglobin: 11.9 g/dL — ABNORMAL LOW (ref 13.0–17.0)
Lymphocytes Relative: 23.5 % (ref 12.0–46.0)
Lymphs Abs: 1.6 10*3/uL (ref 0.7–4.0)
MCHC: 33.5 g/dL (ref 30.0–36.0)
MCV: 86.5 fl (ref 78.0–100.0)
Monocytes Absolute: 0.4 10*3/uL (ref 0.1–1.0)
Monocytes Relative: 6.6 % (ref 3.0–12.0)
Neutro Abs: 4.5 10*3/uL (ref 1.4–7.7)
Neutrophils Relative %: 67.1 % (ref 43.0–77.0)
Platelets: 150 10*3/uL (ref 150.0–400.0)
RBC: 4.1 Mil/uL — ABNORMAL LOW (ref 4.22–5.81)
RDW: 15.1 % (ref 11.5–15.5)
WBC: 6.7 10*3/uL (ref 4.0–10.5)

## 2014-09-23 LAB — BASIC METABOLIC PANEL
BUN: 19 mg/dL (ref 6–23)
CO2: 27 mEq/L (ref 19–32)
Calcium: 9.3 mg/dL (ref 8.4–10.5)
Chloride: 106 mEq/L (ref 96–112)
Creatinine, Ser: 1.12 mg/dL (ref 0.40–1.50)
GFR: 68.22 mL/min (ref >60.00)
Glucose, Bld: 235 mg/dL — ABNORMAL HIGH (ref 70–99)
Potassium: 4.3 mEq/L (ref 3.5–5.1)
Sodium: 137 mEq/L (ref 135–145)

## 2014-09-23 LAB — PROTIME-INR
INR: 1 ratio (ref 0.8–1.0)
Prothrombin Time: 11.3 s (ref 9.6–13.1)

## 2014-09-23 NOTE — Patient Instructions (Addendum)
Medication Instructions:  Your physician recommends that you continue on your current medications as directed. Please refer to the Current Medication list given to you today.   Labwork: Pre-procedure labs today: Bmet, Cbc, Pt/Inr  Testing/Procedures: Your physician has requested that you have a cardiac catheterization. Cardiac catheterization is used to diagnose and/or treat various heart conditions. Doctors may recommend this procedure for a number of different reasons. The most common reason is to evaluate chest pain. Chest pain can be a symptom of coronary artery disease (CAD), and cardiac catheterization can show whether plaque is narrowing or blocking your heart's arteries. This procedure is also used to evaluate the valves, as well as measure the blood flow and oxygen levels in different parts of your heart. For further information please visit [email or web address]. Please follow instruction sheet, as given.    Follow-Up: Your physician recommends that you schedule a follow-up appointment pending cath   Any Other Special Instructions Will Be Listed Below (If Applicable). We will request your records from your prior cardiologist Dr.Gelber

## 2014-09-23 NOTE — Progress Notes (Signed)
  Cardiology Office Note   Date:  09/23/2014   ID:  Cutberto Ledford, DOB 02/16/1941, MRN [id number]  PCP:  POLITE,RONALD D, MD  Cardiologist:   SMITH III,HENRY W, MD   Chief Complaint  Patient presents with  . Follow-up      History of Present Illness:  Karlis Wuertz is a 74 y.o. male who presents for CAD, IMI, and BMS RCA 1997.  He has dyspnea on exertion. He does not feel much worse than baseline. He did have been BM stent during acute inferior infarction in 1997. He does not remember them calling his event a heart attack. The nuclear study done recently reveals a large inferior scar with an ejection fraction less than 40%. The systolic dysfunction likely contributes to his shortness of breath. He denies orthopnea. 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 ECG done on the last office visit revealed inferior T-wave abnormality suggesting ischemia. This in conjunction with dyspnea led to the nuclear test.  The nuclear study was read by Dr. Nelson and interpreted as high risk . He states prior studies were done [place] we don't have those records. We don't have any prior history of LV functional assessment. We'll try to obtain this information from Dr. Philip Gelber at [facility] on [place].  Past Medical History  Diagnosis Date  . Essential hypertension, benign   . Other and unspecified hyperlipidemia   . Chronic kidney disease, stage II (mild)   . Type II or unspecified type diabetes mellitus without mention of complication, uncontrolled   . PAD (peripheral artery disease)   . Orthostasis   . CAD (coronary artery disease)     with IMI and BMS 1997  . Gout     Past Surgical History  Procedure Laterality Date  . Hernia repair    . Cholecystectomy    . Cataract extraction    . Melanoma excision    . Skin lesion excision    . Abdominal aortagram N/A 10/09/2013    Procedure: ABDOMINAL AORTAGRAM;  Surgeon:  Muhammad A Arida, MD;  Location: [facility] CATH LAB;  Service: Cardiovascular;  Laterality: N/A;     Current Outpatient Prescriptions  Medication Sig Dispense Refill  . acetaminophen (TYLENOL) 500 MG tablet Take 1,000 mg by mouth daily.    . allopurinol (ZYLOPRIM) 100 MG tablet TAKE 1 TABLET EVERY DAY 90 tablet 1  . aspirin EC 81 MG tablet Take 1 tablet (81 mg total) by mouth daily. 1 tablet 0  . atorvastatin (LIPITOR) 40 MG tablet TAKE 1 TABLET EVERY DAY 90 tablet 1  . Blood Glucose Calibration (ACCU-CHEK SMARTVIEW CONTROL) LIQD Use as directed 1 each 1  . Blood Glucose Monitoring Suppl (ACCU-CHEK NANO SMARTVIEW) W/DEVICE KIT Use to check blood sugars 2 times per day dx code 250.02 1 kit 1  . carvedilol (COREG) 3.125 MG tablet Take 1 tablet (3.125 mg total) by mouth 2 (two) times daily. 60 tablet 5  . cholecalciferol (VITAMIN D) 1000 UNITS tablet Take 2,000 Units by mouth daily.    . Co-Enzyme Q-10 100 MG CAPS Take 100 mg by mouth daily.    . CYCLOSET 0.8 MG TABS TAKE 6 TABLETS ONCE A DAY (Patient taking differently: TAKE 5 TABLETS ONCE A DAY) 540 tablet 1  . ferrous sulfate 325 (65 FE) MG tablet Take 325 mg by mouth daily.     . fluorouracil (EFUDEX) 5 % cream Apply 1 application topically daily.    .   folic acid (FOLVITE) 694 MCG tablet Take 800 mcg by mouth daily.    Marland Kitchen GLIPIZIDE XL 10 MG 24 hr tablet TAKE 1 TABLET BY MOUTH DAILY WITH BREAKFAST (Patient taking differently: TAKE 1 TABLET BY MOUTH DAILY AT BEDTIME) 90 tablet 1  . glucose blood (BAYER CONTOUR TEST) test strip Use as instructed to check blood sugar 2 times per day dx code E11.65 100 each 12  . metFORMIN (GLUCOPHAGE) 1000 MG tablet Take 1 tablet (1,000 mg total) by mouth 2 (two) times daily with a meal. 180 tablet 3  . MICROLET LANCETS MISC USE ONE LANCET TO CHECK GLUCOSE TWICE DAILY AS DIRECTED DX CODE E11.65 100 each 3  . miglitol (GLYSET) 50 MG tablet Take 1 tablet (50 mg total) by mouth 3 (three) times daily with meals. (Patient  taking differently: Take 50 mg by mouth 2 (two) times daily. ) 90 tablet 5  . Multiple Vitamins-Minerals (MEMORY VITE) TABS Take 1 tablet by mouth daily.    . Multiple Vitamins-Minerals (PRESERVISION AREDS 2) CAPS Take 2 capsules by mouth daily.    . nitroGLYCERIN (NITROSTAT) 0.4 MG SL tablet Place 1 tablet (0.4 mg total) under the tongue every 5 (five) minutes as needed. 25 tablet 3  . omega-3 acid ethyl esters (LOVAZA) 1 G capsule Take 1 g by mouth daily.    . sitaGLIPtin (JANUVIA) 100 MG tablet Take 1 tablet (100 mg total) by mouth daily. 30 tablet 5  . tamsulosin (FLOMAX) 0.4 MG CAPS capsule Take 1 capsule (0.4 mg total) by mouth daily. 30 capsule 0  . vitamin B-12 (CYANOCOBALAMIN) 500 MCG tablet Take 1,000 mcg by mouth daily.     No current facility-administered medications for this visit.    Allergies:   Penicillins; Plavix; and Pletal    Social History:  The patient  reports that he has been smoking.  He has never used smokeless tobacco. He reports that he does not drink alcohol or use illicit drugs.   Family History:  The patient's family history includes Colon cancer in his father; Heart disease in his mother.    ROS:  Please see the history of present illness.   Otherwise, review of systems are positive for decreased memory.   All other systems are reviewed and negative.    PHYSICAL EXAM: VS:  BP 112/50 mmHg  Pulse 55  Ht _0  (1.88 m)  Wt 169 lb 1.9 oz (76.712 kg)  BMI 21.70 kg/m2 , BMI Body mass index is 21.7 kg/(m^2). GEN: Well nourished, well developed, in no acute distress HEENT: normal Neck: no JVD, carotid bruits, or masses Cardiac: RRR; no murmurs, rubs, or gallops,no edema  Respiratory:  clear to auscultation bilaterally, normal work of breathing GI: soft, nontender, nondistended, + BS MS: no deformity or atrophy Skin: warm and dry, no rash Neuro:  Strength and sensation are intact Psych: euthymic mood, full affect   EKG:  EKG is not ordered  today.    Recent Labs: 10/01/2013: Hemoglobin 11.3*; Platelets 148.0* 05/29/2014: ALT 43 08/28/2014: BUN 20; Creatinine 1.14; Potassium 4.3; Sodium 138    Lipid Panel    Component Value Date/Time   CHOL 116 05/29/2014 0950   TRIG 136.0 05/29/2014 0950   HDL 36.70* 05/29/2014 0950   CHOLHDL 3 05/29/2014 0950   VLDL 27.2 05/29/2014 0950   LDLCALC 52 05/29/2014 0950      Wt Readings from Last 3 Encounters:  09/23/14 169 lb 1.9 oz (76.712 kg)  09/09/14 167 lb (75.751 kg)  09/01/14 172 lb (78.019 kg)      Other studies Reviewed: Additional studies/ records that were reviewed today include: .   ASSESSMENT AND PLAN:   Coronary artery disease involving native coronary artery of native heart without angina pectoris: Though angina is not occurring, dyspnea may be an anginal equivalent: High risk myocardial perfusion study as outlined above  I have recommended that the patient undergo coronary angiography to define anatomy and help exclude progression of disease and would account for the decreased LV function and heart failure symptoms. In the meantime we'll try to obtain information from Dr. Beryle Quant. The angiography procedure and risks were discussed with the patient in detail. We did verify that we will likely do the procedure from the radial approach. We discussed the risk of stroke, death, myocardial infarction, kidney injury, bleeding, among other complications. The patient undersands these risks and is willing to proceed.  Essential hypertension, benign  Hyperlipidemia  Systolic heart failure: Ejection fraction 37% by recent wall motion study done as part of the Myoview myocardial perfusion study. Dyspnea may be partially related to systolic heart failure  PAD (peripheral artery disease)  Tobacco use   Current medicines are reviewed at length with the patient today.  The patient has concerns regarding medicines.  The following changes have been made:  He has not had any side  effects related to either switch to carvedilol. Claudication is not changed. He is to have a basic metabolic panel and BNP performed today.  Labs/ tests ordered today include:   Orders Placed This Encounter  Procedures  . DG Chest 2 View  . Basic metabolic panel  . CBC w/Diff  . INR/PT     Disposition:   FU with HS in 2 Following the coronary angiogram  Signed, Sinclair Grooms, MD  09/23/2014 1:15 PM    [facility] [place], [place], [place]  [address] Phone: [phone]; Fax: [phone]

## 2014-09-24 ENCOUNTER — Telehealth: Payer: Self-pay | Admitting: Interventional Cardiology

## 2014-09-24 NOTE — Telephone Encounter (Signed)
New message     For Mario [name] Pt was seen on 09-23-14.  He wanted you to know that his records from [place] was sent to Dr Tamala Julian at [place] a couple of years ago.

## 2014-09-26 ENCOUNTER — Other Ambulatory Visit: Payer: Self-pay | Admitting: *Deleted

## 2014-09-26 MED ORDER — GLUCOSE BLOOD VI STRP: ORAL_STRIP | Status: DC

## 2014-09-29 ENCOUNTER — Ambulatory Visit (INDEPENDENT_AMBULATORY_CARE_PROVIDER_SITE_OTHER): Payer: Commercial Managed Care - HMO | Admitting: Ophthalmology

## 2014-09-29 DIAGNOSIS — H43813 Vitreous degeneration, bilateral: Secondary | ICD-10-CM | POA: Diagnosis not present

## 2014-09-29 DIAGNOSIS — H33302 Unspecified retinal break, left eye: Secondary | ICD-10-CM | POA: Diagnosis not present

## 2014-09-29 DIAGNOSIS — H35033 Hypertensive retinopathy, bilateral: Secondary | ICD-10-CM | POA: Diagnosis not present

## 2014-09-29 DIAGNOSIS — E11319 Type 2 diabetes mellitus with unspecified diabetic retinopathy without macular edema: Secondary | ICD-10-CM | POA: Diagnosis not present

## 2014-09-29 DIAGNOSIS — I10 Essential (primary) hypertension: Secondary | ICD-10-CM

## 2014-09-29 DIAGNOSIS — H3531 Nonexudative age-related macular degeneration: Secondary | ICD-10-CM

## 2014-09-29 DIAGNOSIS — E11329 Type 2 diabetes mellitus with mild nonproliferative diabetic retinopathy without macular edema: Secondary | ICD-10-CM | POA: Diagnosis not present

## 2014-09-30 ENCOUNTER — Telehealth: Payer: Self-pay | Admitting: Endocrinology

## 2014-09-30 ENCOUNTER — Telehealth: Payer: Self-pay

## 2014-09-30 NOTE — Telephone Encounter (Signed)
Pt aware of pre-procedure lab results.  Labs are okay.  Pt verbalized understanding.

## 2014-09-30 NOTE — Telephone Encounter (Signed)
-----   Message from Belva Crome, MD sent at 09/23/2014  5:28 PM EDT ----- Labs are okay.

## 2014-09-30 NOTE — Telephone Encounter (Signed)
Mario [name] came in to speak with Mario [name] Could you please give him a call back ?   Thank you

## 2014-10-02 ENCOUNTER — Encounter (HOSPITAL_COMMUNITY)
Admission: RE | Disposition: A | Discharge status: Home / Self Care | Payer: Self-pay | Source: Ambulatory Visit | Attending: Interventional Cardiology

## 2014-10-02 ENCOUNTER — Ambulatory Visit (HOSPITAL_COMMUNITY)
Admission: RE | Admit: 2014-10-02 | Discharge: 2014-10-02 | Disposition: A | Discharge status: Home / Self Care | Payer: Commercial Managed Care - HMO | Source: Ambulatory Visit | Attending: Interventional Cardiology | Admitting: Interventional Cardiology

## 2014-10-02 ENCOUNTER — Encounter (HOSPITAL_COMMUNITY)
Admission: RE | Disposition: A | Discharge status: Home / Self Care | Payer: Commercial Managed Care - HMO | Source: Ambulatory Visit | Attending: Interventional Cardiology

## 2014-10-02 DIAGNOSIS — E785 Hyperlipidemia, unspecified: Secondary | ICD-10-CM | POA: Diagnosis not present

## 2014-10-02 DIAGNOSIS — I5022 Chronic systolic (congestive) heart failure: Secondary | ICD-10-CM | POA: Diagnosis not present

## 2014-10-02 DIAGNOSIS — I2510 Atherosclerotic heart disease of native coronary artery without angina pectoris: Secondary | ICD-10-CM

## 2014-10-02 DIAGNOSIS — I129 Hypertensive chronic kidney disease with stage 1 through stage 4 chronic kidney disease, or unspecified chronic kidney disease: Secondary | ICD-10-CM | POA: Insufficient documentation

## 2014-10-02 DIAGNOSIS — I739 Peripheral vascular disease, unspecified: Secondary | ICD-10-CM | POA: Insufficient documentation

## 2014-10-02 DIAGNOSIS — I25118 Atherosclerotic heart disease of native coronary artery with other forms of angina pectoris: Secondary | ICD-10-CM | POA: Diagnosis not present

## 2014-10-02 DIAGNOSIS — Z888 Allergy status to other drugs, medicaments and biological substances status: Secondary | ICD-10-CM | POA: Insufficient documentation

## 2014-10-02 DIAGNOSIS — R9439 Abnormal result of other cardiovascular function study: Secondary | ICD-10-CM | POA: Diagnosis not present

## 2014-10-02 DIAGNOSIS — N182 Chronic kidney disease, stage 2 (mild): Secondary | ICD-10-CM | POA: Diagnosis not present

## 2014-10-02 DIAGNOSIS — E119 Type 2 diabetes mellitus without complications: Secondary | ICD-10-CM | POA: Diagnosis not present

## 2014-10-02 DIAGNOSIS — Z720 Tobacco use: Secondary | ICD-10-CM | POA: Diagnosis present

## 2014-10-02 DIAGNOSIS — I10 Essential (primary) hypertension: Secondary | ICD-10-CM | POA: Diagnosis present

## 2014-10-02 DIAGNOSIS — I25810 Atherosclerosis of coronary artery bypass graft(s) without angina pectoris: Secondary | ICD-10-CM | POA: Diagnosis present

## 2014-10-02 DIAGNOSIS — Z7982 Long term (current) use of aspirin: Secondary | ICD-10-CM | POA: Insufficient documentation

## 2014-10-02 HISTORY — PX: CARDIAC CATHETERIZATION: SHX172

## 2014-10-02 LAB — GLUCOSE, CAPILLARY
Glucose-Capillary: 162 mg/dL — ABNORMAL HIGH (ref 65–99)
Glucose-Capillary: 150 mg/dL — ABNORMAL HIGH (ref 65–99)

## 2014-10-02 SURGERY — LEFT HEART CATH AND CORONARY ANGIOGRAPHY
Anesthesia: LOCAL

## 2014-10-02 SURGERY — LEFT HEART CATHETERIZATION WITH CORONARY ANGIOGRAM
Anesthesia: LOCAL

## 2014-10-02 MED ORDER — VERAPAMIL HCL 2.5 MG/ML IV SOLN
INTRAVENOUS | Status: AC
Start: 2014-10-02 — End: 2014-10-02
  Filled 2014-10-02: qty 2

## 2014-10-02 MED ORDER — SODIUM CHLORIDE 0.9 % WEIGHT BASED INFUSION: 1.0000 mL/kg/h | INTRAVENOUS | Status: DC

## 2014-10-02 MED ORDER — ACETAMINOPHEN 325 MG PO TABS: 650.0000 mg | ORAL_TABLET | ORAL | Status: DC | PRN

## 2014-10-02 MED ORDER — SODIUM CHLORIDE 0.9 % IV SOLN: 250.0000 mL | INTRAVENOUS | Status: DC | PRN

## 2014-10-02 MED ORDER — SODIUM CHLORIDE 0.9 % WEIGHT BASED INFUSION
3.0000 mL/kg/h | INTRAVENOUS | Status: AC
  Administered 2014-10-02: 3 mL/kg/h via INTRAVENOUS

## 2014-10-02 MED ORDER — SODIUM CHLORIDE 0.9 % WEIGHT BASED INFUSION: 3.0000 mL/kg/h | INTRAVENOUS | Status: AC

## 2014-10-02 MED ORDER — SODIUM CHLORIDE 0.9 % IJ SOLN: 3.0000 mL | INTRAMUSCULAR | Status: DC | PRN

## 2014-10-02 MED ORDER — SODIUM CHLORIDE 0.9 % IJ SOLN: 3.0000 mL | Freq: Two times a day (BID) | INTRAMUSCULAR | Status: DC

## 2014-10-02 MED ORDER — IOHEXOL 350 MG/ML SOLN
INTRAVENOUS | Status: DC | PRN
  Administered 2014-10-02: 50 mL via INTRA_ARTERIAL
  Administered 2014-10-02: 100 mL via INTRA_ARTERIAL

## 2014-10-02 MED ORDER — MIDAZOLAM HCL 2 MG/2ML IJ SOLN
INTRAMUSCULAR | Status: AC
  Filled 2014-10-02: qty 2

## 2014-10-02 MED ORDER — HEPARIN SODIUM (PORCINE) 1000 UNIT/ML IJ SOLN
INTRAMUSCULAR | Status: AC
  Filled 2014-10-02: qty 1

## 2014-10-02 MED ORDER — MIDAZOLAM HCL 2 MG/2ML IJ SOLN
INTRAMUSCULAR | Status: DC | PRN
  Administered 2014-10-02 (×2): 1 mg via INTRAVENOUS

## 2014-10-02 MED ORDER — HEPARIN (PORCINE) IN NACL 2-0.9 UNIT/ML-% IJ SOLN
INTRAMUSCULAR | Status: AC
  Filled 2014-10-02: qty 500

## 2014-10-02 MED ORDER — ASPIRIN 81 MG PO CHEW
CHEWABLE_TABLET | ORAL | Status: AC
  Filled 2014-10-02: qty 1

## 2014-10-02 MED ORDER — FENTANYL CITRATE (PF) 100 MCG/2ML IJ SOLN
INTRAMUSCULAR | Status: DC | PRN
  Administered 2014-10-02 (×2): 50 ug via INTRAVENOUS

## 2014-10-02 MED ORDER — HEPARIN SODIUM (PORCINE) 1000 UNIT/ML IJ SOLN
INTRAMUSCULAR | Status: DC | PRN
  Administered 2014-10-02: 2000 [IU] via INTRAVENOUS

## 2014-10-02 MED ORDER — OXYCODONE-ACETAMINOPHEN 5-325 MG PO TABS: 1.0000 | ORAL_TABLET | ORAL | Status: DC | PRN

## 2014-10-02 MED ORDER — FENTANYL CITRATE (PF) 100 MCG/2ML IJ SOLN
INTRAMUSCULAR | Status: AC
Start: 2014-10-02 — End: 2014-10-02
  Filled 2014-10-02: qty 2

## 2014-10-02 MED ORDER — ONDANSETRON HCL 4 MG/2ML IJ SOLN: 4.0000 mg | Freq: Four times a day (QID) | INTRAMUSCULAR | Status: DC | PRN

## 2014-10-02 MED ORDER — HEPARIN (PORCINE) IN NACL 2-0.9 UNIT/ML-% IJ SOLN
INTRAMUSCULAR | Status: AC
  Filled 2014-10-02: qty 1500

## 2014-10-02 MED ORDER — LIDOCAINE HCL (PF) 1 % IJ SOLN
INTRAMUSCULAR | Status: AC
  Filled 2014-10-02: qty 30

## 2014-10-02 MED ORDER — VERAPAMIL HCL 2.5 MG/ML IV SOLN
INTRAVENOUS | Status: DC | PRN
  Administered 2014-10-02: 11:00:00 via INTRA_ARTERIAL

## 2014-10-02 MED ORDER — ASPIRIN 81 MG PO CHEW
81.0000 mg | CHEWABLE_TABLET | ORAL | Status: AC
  Administered 2014-10-02: 81 mg via ORAL

## 2014-10-02 SURGICAL SUPPLY — 16 items
CATH INFINITI 5 FR JL3.5 (CATHETERS) IMPLANT
CATH INFINITI 5FR JL4 (CATHETERS) ×2 IMPLANT
CATH INFINITI 5FR JL5 (CATHETERS) ×2 IMPLANT
CATH INFINITI JR4 5F (CATHETERS) ×2 IMPLANT
CATH SITESEER 5F MULTI A 2 (CATHETERS) ×2 IMPLANT
DEVICE RAD COMP TR BAND LRG (VASCULAR PRODUCTS) ×2 IMPLANT
GLIDESHEATH SLEND A-KIT 6F 22G (SHEATH) ×2 IMPLANT
KIT HEART LEFT (KITS) ×2 IMPLANT
PACK CARDIAC CATHETERIZATION (CUSTOM PROCEDURE TRAY) ×2 IMPLANT
SHEATH PINNACLE 5F 10CM (SHEATH) ×2 IMPLANT
TRANSDUCER W/STOPCOCK (MISCELLANEOUS) ×2 IMPLANT
TUBING CIL FLEX 10 FLL-RA (TUBING) ×2 IMPLANT
WIRE EMERALD 3MM-J .025X260CM (WIRE) ×2 IMPLANT
WIRE EMERALD 3MM-J .035X150CM (WIRE) ×2 IMPLANT
WIRE HI TORQ VERSACORE-J 145CM (WIRE) ×2 IMPLANT
WIRE SAFE-T 1.5MM-J .035X260CM (WIRE) ×2 IMPLANT

## 2014-10-02 NOTE — H&P (View-Only) (Signed)
Cardiology Office Note   Date:  09/23/2014   ID:  Janace Litten, DOB 03-11-1941, MRN [id number]  PCP:  Kandice Hams, MD  Cardiologist:   Sinclair Grooms, MD   Chief Complaint  Patient presents with  . Follow-up      History of Present Illness:  Mario [name] is a 74 y.o. male who presents for CAD, IMI, and BMS RCA 1997.  He has dyspnea on exertion. He does not feel much worse than baseline. He did have been BM stent during acute inferior infarction in 1997. He does not remember them calling his event a heart attack. The nuclear study done recently reveals a large inferior scar with an ejection fraction less than 40%. The systolic dysfunction likely contributes to his shortness of breath. He denies orthopnea. 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 ECG done on the last office visit revealed inferior T-wave abnormality suggesting ischemia. This in conjunction with dyspnea led to the nuclear test.  The nuclear study was read by Dr. Meda Coffee and interpreted as high risk . He states prior studies were done [place] we don't have those records. We don't have any prior history of LV functional assessment. We'll try to obtain this information from Dr. Phillips Climes at [facility] on [address].  Past Medical History  Diagnosis Date  . Essential hypertension, benign   . Other and unspecified hyperlipidemia   . Chronic kidney disease, stage II (mild)   . Type II or unspecified type diabetes mellitus without mention of complication, uncontrolled   . PAD (peripheral artery disease)   . Orthostasis   . CAD (coronary artery disease)     with IMI and BMS 1997  . Gout     Past Surgical History  Procedure Laterality Date  . Hernia repair    . Cholecystectomy    . Cataract extraction    . Melanoma excision    . Skin lesion excision    . Abdominal aortagram N/A 10/09/2013    Procedure: ABDOMINAL Maxcine Ham;  Surgeon:  Wellington Hampshire, MD;  Location: [facility] CATH LAB;  Service: Cardiovascular;  Laterality: N/A;     Current Outpatient Prescriptions  Medication Sig Dispense Refill  . acetaminophen (TYLENOL) 500 MG tablet Take 1,000 mg by mouth daily.    Marland Kitchen allopurinol (ZYLOPRIM) 100 MG tablet TAKE 1 TABLET EVERY DAY 90 tablet 1  . aspirin EC 81 MG tablet Take 1 tablet (81 mg total) by mouth daily. 1 tablet 0  . atorvastatin (LIPITOR) 40 MG tablet TAKE 1 TABLET EVERY DAY 90 tablet 1  . Blood Glucose Calibration (ACCU-CHEK SMARTVIEW CONTROL) LIQD Use as directed 1 each 1  . Blood Glucose Monitoring Suppl (ACCU-CHEK NANO SMARTVIEW) W/DEVICE KIT Use to check blood sugars 2 times per day dx code 250.02 1 kit 1  . carvedilol (COREG) 3.125 MG tablet Take 1 tablet (3.125 mg total) by mouth 2 (two) times daily. 60 tablet 5  . cholecalciferol (VITAMIN D) 1000 UNITS tablet Take 2,000 Units by mouth daily.    Marland Kitchen Co-Enzyme Q-10 100 MG CAPS Take 100 mg by mouth daily.    . CYCLOSET 0.8 MG TABS TAKE 6 TABLETS ONCE A DAY (Patient taking differently: TAKE 5 TABLETS ONCE A DAY) 540 tablet 1  . ferrous sulfate 325 (65 FE) MG tablet Take 325 mg by mouth daily.     . fluorouracil (EFUDEX) 5 % cream Apply 1 application topically daily.    Marland Kitchen  folic acid (FOLVITE) 381 MCG tablet Take 800 mcg by mouth daily.    Marland Kitchen GLIPIZIDE XL 10 MG 24 hr tablet TAKE 1 TABLET BY MOUTH DAILY WITH BREAKFAST (Patient taking differently: TAKE 1 TABLET BY MOUTH DAILY AT BEDTIME) 90 tablet 1  . glucose blood (BAYER CONTOUR TEST) test strip Use as instructed to check blood sugar 2 times per day dx code E11.65 100 each 12  . metFORMIN (GLUCOPHAGE) 1000 MG tablet Take 1 tablet (1,000 mg total) by mouth 2 (two) times daily with a meal. 180 tablet 3  . MICROLET LANCETS MISC USE ONE LANCET TO CHECK GLUCOSE TWICE DAILY AS DIRECTED DX CODE E11.65 100 each 3  . miglitol (GLYSET) 50 MG tablet Take 1 tablet (50 mg total) by mouth 3 (three) times daily with meals. (Patient  taking differently: Take 50 mg by mouth 2 (two) times daily. ) 90 tablet 5  . Multiple Vitamins-Minerals (MEMORY VITE) TABS Take 1 tablet by mouth daily.    . Multiple Vitamins-Minerals (PRESERVISION AREDS 2) CAPS Take 2 capsules by mouth daily.    . nitroGLYCERIN (NITROSTAT) 0.4 MG SL tablet Place 1 tablet (0.4 mg total) under the tongue every 5 (five) minutes as needed. 25 tablet 3  . omega-3 acid ethyl esters (LOVAZA) 1 G capsule Take 1 g by mouth daily.    . sitaGLIPtin (JANUVIA) 100 MG tablet Take 1 tablet (100 mg total) by mouth daily. 30 tablet 5  . tamsulosin (FLOMAX) 0.4 MG CAPS capsule Take 1 capsule (0.4 mg total) by mouth daily. 30 capsule 0  . vitamin B-12 (CYANOCOBALAMIN) 500 MCG tablet Take 1,000 mcg by mouth daily.     No current facility-administered medications for this visit.    Allergies:   Penicillins; Plavix; and Pletal    Social History:  The patient  reports that he has been smoking.  He has never used smokeless tobacco. He reports that he does not drink alcohol or use illicit drugs.   Family History:  The patient's family history includes Colon cancer in his father; Heart disease in his mother.    ROS:  Please see the history of present illness.   Otherwise, review of systems are positive for decreased memory.   All other systems are reviewed and negative.    PHYSICAL EXAM: VS:  BP 112/50 mmHg  Pulse 55  Ht _0  (1.88 m)  Wt 169 lb 1.9 oz (76.712 kg)  BMI 21.70 kg/m2 , BMI Body mass index is 21.7 kg/(m^2). GEN: Well nourished, well developed, in no acute distress HEENT: normal Neck: no JVD, carotid bruits, or masses Cardiac: RRR; no murmurs, rubs, or gallops,no edema  Respiratory:  clear to auscultation bilaterally, normal work of breathing GI: soft, nontender, nondistended, + BS MS: no deformity or atrophy Skin: warm and dry, no rash Neuro:  Strength and sensation are intact Psych: euthymic mood, full affect   EKG:  EKG is not ordered  today.    Recent Labs: 10/01/2013: Hemoglobin 11.3*; Platelets 148.0* 05/29/2014: ALT 43 08/28/2014: BUN 20; Creatinine 1.14; Potassium 4.3; Sodium 138    Lipid Panel    Component Value Date/Time   CHOL 116 05/29/2014 0950   TRIG 136.0 05/29/2014 0950   HDL 36.70* 05/29/2014 0950   CHOLHDL 3 05/29/2014 0950   VLDL 27.2 05/29/2014 0950   LDLCALC 52 05/29/2014 0950      Wt Readings from Last 3 Encounters:  09/23/14 169 lb 1.9 oz (76.712 kg)  09/09/14 167 lb (75.751 kg)  09/01/14 172 lb (78.019 kg)      Other studies Reviewed: Additional studies/ records that were reviewed today include: .   ASSESSMENT AND PLAN:   Coronary artery disease involving native coronary artery of native heart without angina pectoris: Though angina is not occurring, dyspnea may be an anginal equivalent: High risk myocardial perfusion study as outlined above  I have recommended that the patient undergo coronary angiography to define anatomy and help exclude progression of disease and would account for the decreased LV function and heart failure symptoms. In the meantime we'll try to obtain information from Dr. Beryle Quant. The angiography procedure and risks were discussed with the patient in detail. We did verify that we will likely do the procedure from the radial approach. We discussed the risk of stroke, death, myocardial infarction, kidney injury, bleeding, among other complications. The patient undersands these risks and is willing to proceed.  Essential hypertension, benign  Hyperlipidemia  Systolic heart failure: Ejection fraction 37% by recent wall motion study done as part of the Myoview myocardial perfusion study. Dyspnea may be partially related to systolic heart failure  PAD (peripheral artery disease)  Tobacco use   Current medicines are reviewed at length with the patient today.  The patient has concerns regarding medicines.  The following changes have been made:  He has not had any side  effects related to either switch to carvedilol. Claudication is not changed. He is to have a basic metabolic panel and BNP performed today.  Labs/ tests ordered today include:   Orders Placed This Encounter  Procedures  . DG Chest 2 View  . Basic metabolic panel  . CBC w/Diff  . INR/PT     Disposition:   FU with HS in 2 Following the coronary angiogram  Signed, Sinclair Grooms, MD  09/23/2014 1:15 PM    [facility] [place], [place], [place]  [address] Phone: [phone]; Fax: [phone]

## 2014-10-02 NOTE — Interval H&P Note (Signed)
History and Physical Interval Note:  10/02/2014 Cath Lab Visit (complete for each Cath Lab visit)  Clinical Evaluation Leading to the Procedure:   ACS: No.  Non-ACS:    Anginal Classification: CCS III  Anti-ischemic medical therapy: Maximal Therapy (2 or more classes of medications)  Non-Invasive Test Results: High-risk stress test findings: cardiac mortality >3%/year  Prior CABG: No previous CABG       10:55 AM  Mario [name]  has presented today for surgery, with the diagnosis of abnormal stress test  The various methods of treatment have been discussed with the patient and family. After consideration of risks, benefits and other options for treatment, the patient has consented to  Procedure(s): Left Heart Cath and Coronary Angiography (N/A) as a surgical intervention .  The patient's history has been reviewed, patient examined, no change in status, stable for surgery.  I have reviewed the patient's chart and labs.  Questions were answered to the patient's satisfaction.     Sinclair Grooms

## 2014-10-02 NOTE — Discharge Instructions (Signed)
Radial Site Care Refer to this sheet in the next few weeks. These instructions provide you with information on caring for yourself after your procedure. Your caregiver may also give you more specific instructions. Your treatment has been planned according to current medical practices, but problems sometimes occur. Call your caregiver if you have any problems or questions after your procedure. HOME CARE INSTRUCTIONS  You may shower the day after the procedure.Remove the bandage (dressing) and gently wash the site with plain soap and water.Gently pat the site dry.  Do not apply powder or lotion to the site.  Do not submerge the affected site in water for 3 to 5 days.  Inspect the site at least twice daily.  Do not flex or bend the affected arm for 24 hours.  No lifting over 5 pounds (2.3 kg) for 5 days after your procedure.  Do not drive home if you are discharged the same day of the procedure. Have someone else drive you.  You may drive 24 hours after the procedure unless otherwise instructed by your caregiver.  Do not operate machinery or power tools for 24 hours.  A responsible adult should be with you for the first 24 hours after you arrive home. What to expect:  Any bruising will usually fade within 1 to 2 weeks.  Blood that collects in the tissue (hematoma) may be painful to the touch. It should usually decrease in size and tenderness within 1 to 2 weeks. SEEK IMMEDIATE MEDICAL CARE IF:  You have unusual pain at the radial site.  You have redness, warmth, swelling, or pain at the radial site.  You have drainage (other than a small amount of blood on the dressing).  You have chills.  You have a fever or persistent symptoms for more than 72 hours.  You have a fever and your symptoms suddenly get worse.  Your arm becomes pale, cool, tingly, or numb.  You have heavy bleeding from the site. Hold pressure on the site and call 911. Document Released: 06/11/2010 Document  Revised: 08/01/2011 Document Reviewed: 06/11/2010 [facility] Patient Information 2015 [place], [place]. This information is not intended to replace advice given to you by your health care provider. Make sure you discuss any questions you have with your health care provider.   Angiogram, Care After Refer to this sheet in the next few weeks. These instructions provide you with information on caring for yourself after your procedure. Your health care provider may also give you more specific instructions. Your treatment has been planned according to current medical practices, but problems sometimes occur. Call your health care provider if you have any problems or questions after your procedure.  WHAT TO EXPECT AFTER THE PROCEDURE After your procedure, it is typical to have the following sensations:  Minor discomfort or tenderness and a small bump at the catheter insertion site. The bump should usually decrease in size and tenderness within 1 to 2 weeks.  Any bruising will usually fade within 2 to 4 weeks. HOME CARE INSTRUCTIONS   You may need to keep taking blood thinners if they were prescribed for you. Take medicines only as directed by your health care provider.  Do not apply powder or lotion to the site.  Do not take baths, swim, or use a hot tub until your health care provider approves.  You may shower 24 hours after the procedure. Remove the bandage (dressing) and gently wash the site with plain soap and water. Gently pat the site dry.  Inspect  the site at least twice daily.  Limit your activity for the first 48 hours. Do not bend, squat, or lift anything over 20 lb (9 kg) or as directed by your health care provider.  Plan to have someone take you home after the procedure. Follow instructions about when you can drive or return to work. SEEK MEDICAL CARE IF:  You get light-headed when standing up.  You have drainage (other than a small amount of blood on the dressing).  You have  chills.  You have a fever.  You have redness, warmth, swelling, or pain at the insertion site. SEEK IMMEDIATE MEDICAL CARE IF:   You develop chest pain or shortness of breath, feel faint, or pass out.  You have bleeding, swelling larger than a walnut, or drainage from the catheter insertion site.  You develop pain, discoloration, coldness, or severe bruising in the leg or arm that held the catheter.  You develop bleeding from any other place, such as the bowels. You may see bright red blood in your urine or stools, or your stools may appear black and tarry.  You have heavy bleeding from the site. If this happens, hold pressure on the site and call 911. MAKE SURE YOU:  Understand these instructions.  Will watch your condition.  Will get help right away if you are not doing well or get worse. Document Released: 11/25/2004 Document Revised: 09/23/2013 Document Reviewed: 10/01/2012 [facility] Patient Information 2015 [place], [place]. This information is not intended to replace advice given to you by your health care provider. Make sure you discuss any questions you have with your health care provider.

## 2014-10-02 NOTE — Progress Notes (Signed)
Right femoral sheath removed and pressure held for 20 minutes. Groin level 0,. Right pedal pulse doppler and marked. No apparent complications. Patient verbalizes understanding of post sheath removal instructions.

## 2014-10-03 ENCOUNTER — Telehealth: Payer: Self-pay | Admitting: Interventional Cardiology

## 2014-10-03 ENCOUNTER — Other Ambulatory Visit: Payer: Self-pay | Admitting: *Deleted

## 2014-10-03 ENCOUNTER — Encounter (HOSPITAL_COMMUNITY): Payer: Self-pay | Admitting: Interventional Cardiology

## 2014-10-03 MED ORDER — GLUCOSE BLOOD VI STRP: ORAL_STRIP | Status: DC

## 2014-10-03 MED FILL — Heparin Sodium (Porcine) 2 Unit/ML in Sodium Chloride 0.9%: INTRAMUSCULAR | Qty: 1000 | Status: AC

## 2014-10-03 MED FILL — Lidocaine HCl Local Preservative Free (PF) Inj 1%: INTRAMUSCULAR | Qty: 30 | Status: AC

## 2014-10-03 MED FILL — Heparin Sodium (Porcine) 2 Unit/ML in Sodium Chloride 0.9%: INTRAMUSCULAR | Qty: 1500 | Status: AC

## 2014-10-03 NOTE — Telephone Encounter (Signed)
Patient ask if you could call him please, today if possible.

## 2014-10-03 NOTE — Telephone Encounter (Signed)
Calling stating he had a catheterization yesterday by Dr. Tamala Julian and was not able to do a stent . Told him he would need referral to cardiac surgeons.  He's calling to get referral.  Spoke w/Allison at [facility] and they have him scheduled to see Dr. Oneida Arenas 5/17 at 4:00.  Notified pt.  States he has to get a referral from his PCP.  Advised his office would be calling him also.

## 2014-10-03 NOTE — Telephone Encounter (Signed)
New message    Patient calling need a referral to cardiac surgeon.

## 2014-10-06 NOTE — Telephone Encounter (Signed)
Returned call to Hilda Blades at [place] office. Adv her that pt is being ref to [facility] After his cardiac cath on 5/13 with Dr.Smith For possible cardiac bypass sx. Dx CAD She verbalized understanding.

## 2014-10-06 NOTE — Telephone Encounter (Signed)
New Message       Dr. Lina Sar office calling stating that Dr. Tamala Julian referred pt to another provider and pt has [facility] and has to have a referral. This office is trying to do the referral for pt w/ [employer] but needs the pt's diagnosis. Pt's appt is scheduled for tomorrow. Please call back as soon as possible.

## 2014-10-07 ENCOUNTER — Encounter: Payer: Self-pay | Admitting: Surgery

## 2014-10-07 ENCOUNTER — Institutional Professional Consult (permissible substitution) (INDEPENDENT_AMBULATORY_CARE_PROVIDER_SITE_OTHER): Payer: Commercial Managed Care - HMO | Admitting: Surgery

## 2014-10-07 VITALS — BP 116/66 | HR 76 | Resp 16 | Ht 74.0 in | Wt 170.0 lb

## 2014-10-07 DIAGNOSIS — I2510 Atherosclerotic heart disease of native coronary artery without angina pectoris: Secondary | ICD-10-CM | POA: Diagnosis not present

## 2014-10-08 ENCOUNTER — Other Ambulatory Visit: Payer: Self-pay | Admitting: *Deleted

## 2014-10-08 DIAGNOSIS — I739 Peripheral vascular disease, unspecified: Secondary | ICD-10-CM

## 2014-10-08 DIAGNOSIS — R9439 Abnormal result of other cardiovascular function study: Secondary | ICD-10-CM

## 2014-10-08 DIAGNOSIS — E785 Hyperlipidemia, unspecified: Secondary | ICD-10-CM

## 2014-10-09 ENCOUNTER — Encounter: Payer: Self-pay | Admitting: Surgery

## 2014-10-09 NOTE — Progress Notes (Signed)
Cardiothoracic Surgery Consultation   PCP is Kandice Hams, MD Referring Provider is Belva Crome, MD  Chief Complaint  Patient presents with  . Coronary Artery Disease    cathed 10/02/14    HPI:  The patient is a 74 year old gentleman with hypertension, hyperlipidemia, type 2 DM, stage 2 CKD, and known coronary artery disease s/p IMI and BMS in 1997. He now has new onset dyspnea on exertion that he noticed when lifting stacks of newspapers that he was helping his son deliver. He has had no symptoms at rest and no chest pain. He had a high risk nuclear stress test showing a large scar in the RCA territory with minimal periinfarct ischemia in the mid inferoseptal and apical inferior walls with an LVEF of 34%. There was akinesis in the basal and mid inferolateral and inferior walls and in the basal inferoseptal wall. Cardiac cath on 10/02/2014 showed severe calcification of the coronary arteries, mitral annulus, aortic arch and great vessels. This prevented performing the procedure from the right radial approach due to the heavy calcification in the subclavian and innominate arteries preventing advancement of the catheter in to the aorta. There was a severe 90% mid RCA stenosis, 80% mid LAD and 70% diagonal stenosis, and 85% proximal LCX and OM1 stenosis. LVEF was 40%.  Past Medical History  Diagnosis Date  . Essential hypertension, benign   . Other and unspecified hyperlipidemia   . Chronic kidney disease, stage II (mild)   . Type II or unspecified type diabetes mellitus without mention of complication, uncontrolled   . PAD (peripheral artery disease)   . Orthostasis   . CAD (coronary artery disease)     with IMI and BMS 1997  . Gout     Past Surgical History  Procedure Laterality Date  . Hernia repair    . Cholecystectomy    . Cataract extraction    . Melanoma excision    . Skin lesion excision    . Abdominal aortagram N/A 10/09/2013    Procedure: ABDOMINAL Maxcine Ham;   Surgeon: Wellington Hampshire, MD;  Location: [facility] CATH LAB;  Service: Cardiovascular;  Laterality: N/A;  . Cardiac catheterization N/A 10/02/2014    Procedure: Left Heart Cath and Coronary Angiography;  Surgeon: Belva Crome, MD;  Location: [facility] CV LAB;  Service: Cardiovascular;  Laterality: N/A;    Family History  Problem Relation Age of Onset  . Colon cancer Father   . Heart disease Mother     Social History History  Substance Use Topics  . Smoking status: Current Every Day Smoker -- 0.50 packs/day for 49 years    Types: Cigarettes  . Smokeless tobacco: Never Used  . Alcohol Use: No    Current Outpatient Prescriptions  Medication Sig Dispense Refill  . acetaminophen (TYLENOL) 500 MG tablet Take 1,000 mg by mouth daily.    Marland Kitchen allopurinol (ZYLOPRIM) 100 MG tablet TAKE 1 TABLET EVERY DAY 90 tablet 1  . aspirin EC 81 MG tablet Take 1 tablet (81 mg total) by mouth daily. 1 tablet 0  . atorvastatin (LIPITOR) 40 MG tablet TAKE 1 TABLET EVERY DAY 90 tablet 1  . Blood Glucose Calibration (ACCU-CHEK SMARTVIEW CONTROL) LIQD Use as directed 1 each 1  . Blood Glucose Monitoring Suppl (ACCU-CHEK NANO SMARTVIEW) W/DEVICE KIT Use to check blood sugars 2 times per day dx code 250.02 1 kit 1  . carvedilol (COREG) 3.125 MG tablet Take 1 tablet (3.125 mg total) by mouth 2 (two)  times daily. 60 tablet 5  . cholecalciferol (VITAMIN D) 1000 UNITS tablet Take 2,000 Units by mouth daily.    Marland Kitchen Co-Enzyme Q-10 100 MG CAPS Take 100 mg by mouth daily.    . CYCLOSET 0.8 MG TABS TAKE 6 TABLETS ONCE A DAY (Patient taking differently: TAKE 5 TABLETS ONCE A DAY) 540 tablet 1  . ferrous sulfate 325 (65 FE) MG tablet Take 325 mg by mouth daily.     . fluorouracil (EFUDEX) 5 % cream Apply 1 application topically daily.    . folic acid (FOLVITE) 183 MCG tablet Take 800 mcg by mouth daily.    Marland Kitchen GLIPIZIDE XL 10 MG 24 hr tablet TAKE 1 TABLET BY MOUTH DAILY WITH BREAKFAST (Patient taking differently: TAKE 1 TABLET BY  MOUTH DAILY AT BEDTIME) 90 tablet 1  . metFORMIN (GLUCOPHAGE) 1000 MG tablet Take 1 tablet (1,000 mg total) by mouth 2 (two) times daily with a meal. 180 tablet 3  . MICROLET LANCETS MISC USE ONE LANCET TO CHECK GLUCOSE TWICE DAILY AS DIRECTED DX CODE E11.65 100 each 3  . miglitol (GLYSET) 50 MG tablet Take 1 tablet (50 mg total) by mouth 3 (three) times daily with meals. (Patient taking differently: Take 50 mg by mouth 2 (two) times daily. ) 90 tablet 5  . Multiple Vitamins-Minerals (MEMORY VITE) TABS Take 1 tablet by mouth daily.    . Multiple Vitamins-Minerals (PRESERVISION AREDS 2) CAPS Take 2 capsules by mouth daily.    . nitroGLYCERIN (NITROSTAT) 0.4 MG SL tablet Place 1 tablet (0.4 mg total) under the tongue every 5 (five) minutes as needed. (Patient taking differently: Place 0.4 mg under the tongue every 5 (five) minutes as needed for chest pain. ) 25 tablet 3  . omega-3 acid ethyl esters (LOVAZA) 1 G capsule Take 1 g by mouth daily.    . sitaGLIPtin (JANUVIA) 100 MG tablet Take 1 tablet (100 mg total) by mouth daily. 30 tablet 5  . tamsulosin (FLOMAX) 0.4 MG CAPS capsule Take 1 capsule (0.4 mg total) by mouth daily. 30 capsule 0  . vitamin B-12 (CYANOCOBALAMIN) 500 MCG tablet Take 1,000 mcg by mouth daily.    Marland Kitchen glucose blood (ACCU-CHEK SMARTVIEW) test strip Use as instructed to check blood sugar 2 times per day dx code E11.65 200 each 1   No current facility-administered medications for this visit.    Allergies  Allergen Reactions  . Penicillins Anaphylaxis  . Plavix [Clopidogrel Bisulfate] Hives  . Pletal [Cilostazol] Itching    Review of Systems  Constitutional: Negative.   HENT: Negative.   Eyes: Negative.   Respiratory: Negative.   Cardiovascular: Negative for chest pain, palpitations and leg swelling.       Exertional dyspnea  Gastrointestinal: Negative.   Endocrine: Negative.   Genitourinary: Negative.   Musculoskeletal: Negative.   Skin: Negative.     Allergic/Immunologic: Negative.   Neurological: Negative.   Hematological: Negative.   Psychiatric/Behavioral: Negative.     BP 116/66 mmHg  Pulse 76  Resp 16  Ht 6' 2"  (1.88 m)  Wt 170 lb (77.111 kg)  BMI 21.82 kg/m2  SpO2 96% Physical Exam  Constitutional: He is oriented to person, place, and time. He appears well-developed and well-nourished. No distress.  HENT:  Head: Normocephalic and atraumatic.  Mouth/Throat: Oropharynx is clear and moist.  Eyes: EOM are normal. Pupils are equal, round, and reactive to light.  Neck: Normal range of motion. Neck supple. No JVD present. No thyromegaly present.  Cardiovascular: Normal  rate, regular rhythm and normal heart sounds.   No murmur heard. Pulmonary/Chest: Effort normal and breath sounds normal. No respiratory distress. He has no wheezes.  Abdominal: Soft. Bowel sounds are normal. He exhibits no distension and no mass. There is no tenderness.  Musculoskeletal: Normal range of motion. He exhibits no edema.  Lymphadenopathy:    He has no cervical adenopathy.  Neurological: He is alert and oriented to person, place, and time. He has normal strength. No cranial nerve deficit or sensory deficit.  Skin: Skin is warm and dry.  Psychiatric: He has a normal mood and affect.     Diagnostic Tests:  Cardiac Cath 10/02/2014:   Severe coronary, mitral annular, aortic arch, subclavian, left common carotid, and right innominate artery calcification easily seen with fluoroscopy.  Unable to perform the procedure from the right radial approach due to heavy calcification within subclavian and innominate artery preventing advancement of the diagnostic catheter into the ascending aorta.  Severe mid RCA 90% stenosis. Severe proximal circumflex and first obtuse marginal 85% stenosis. Moderately severe mid LAD/ diagonal 80/70% stenosis respectively. The distal right coronary fills late by collaterals from the left coronary system.  Left ventricular  dysfunction with basal to mid inferior wall akinesis. EF 40%.  RECOMMENDATIONS:   TCTS evaluation for possible bypass grafting  Depending upon the surgical opinion, further management may be with medical therapy versus complicated PCI procedures that would include rotational atherectomy in the heavy calcification noted throughout the patient's arterial tree.   Impression:  He has severe multi-vessel coronary disease with moderate LV dysfunction and new onset of exertional dyspnea. I agree that CABG is indicated to prevent further ischemia and infarction and to preserve myocardium, improve symptoms. He has severe arterial calcification involving the aortic arch and great vessels seen on fluoroscopy and I am going to get a CT scan of the chest to evaluate the aorta preop to see if there is significant calcification or plaque in the ascending aorta that may prevent safe cannulation and bypass. I reviewed his cath findings with him and his family and answered their questions. I discussed the operative procedure with the patient and family including alternatives, benefits and risks; including but not limited to bleeding, blood transfusion, infection, stroke, myocardial infarction, graft failure, heart block requiring a permanent pacemaker, organ dysfunction, and death.  Janace Litten understands and agrees to proceed.  We will schedule surgery once his CT scan of the chest is completed and I have reviewed it.   Plan:  CABG will be scheduled after his chest CT.  Gaye Pollack, MD [facility] [phone]

## 2014-10-14 ENCOUNTER — Other Ambulatory Visit: Payer: Commercial Managed Care - HMO

## 2014-10-16 ENCOUNTER — Telehealth: Payer: Self-pay | Admitting: *Deleted

## 2014-10-16 ENCOUNTER — Ambulatory Visit
Admission: RE | Admit: 2014-10-16 | Discharge: 2014-10-16 | Disposition: A | Discharge status: Home / Self Care | Payer: Commercial Managed Care - HMO | Source: Ambulatory Visit | Attending: Surgery | Admitting: Surgery

## 2014-10-16 ENCOUNTER — Telehealth: Payer: Self-pay | Admitting: Endocrinology

## 2014-10-16 DIAGNOSIS — I739 Peripheral vascular disease, unspecified: Secondary | ICD-10-CM

## 2014-10-16 DIAGNOSIS — E785 Hyperlipidemia, unspecified: Secondary | ICD-10-CM

## 2014-10-16 DIAGNOSIS — R9439 Abnormal result of other cardiovascular function study: Secondary | ICD-10-CM

## 2014-10-16 MED ORDER — GLUCOSE BLOOD VI STRP: ORAL_STRIP | Status: DC

## 2014-10-16 MED ORDER — IOPAMIDOL (ISOVUE-370) INJECTION 76%
75.0000 mL | Freq: Once | INTRAVENOUS | Status: AC | PRN
  Administered 2014-10-16: 75 mL via INTRAVENOUS

## 2014-10-16 NOTE — Telephone Encounter (Signed)
Patient called and would like Suanne Marker to please call him     Thank you

## 2014-10-16 NOTE — Telephone Encounter (Signed)
Noted, patient is aware. 

## 2014-10-16 NOTE — Telephone Encounter (Signed)
Patient had a cat scan done this morning. He was instructed not to take his Glucophage for 48 hours, or unless you tell him to start earlier.

## 2014-10-16 NOTE — Telephone Encounter (Signed)
He can start in 24 hours

## 2014-10-21 ENCOUNTER — Other Ambulatory Visit: Payer: Self-pay | Admitting: *Deleted

## 2014-10-21 ENCOUNTER — Ambulatory Visit (INDEPENDENT_AMBULATORY_CARE_PROVIDER_SITE_OTHER): Payer: Commercial Managed Care - HMO | Admitting: Cardiovascular Disease

## 2014-10-21 ENCOUNTER — Encounter: Payer: Self-pay | Admitting: Cardiovascular Disease

## 2014-10-21 VITALS — BP 124/68 | HR 73 | Ht 74.0 in | Wt 167.8 lb

## 2014-10-21 DIAGNOSIS — E785 Hyperlipidemia, unspecified: Secondary | ICD-10-CM

## 2014-10-21 DIAGNOSIS — I10 Essential (primary) hypertension: Secondary | ICD-10-CM

## 2014-10-21 DIAGNOSIS — I2584 Coronary atherosclerosis due to calcified coronary lesion: Secondary | ICD-10-CM

## 2014-10-21 DIAGNOSIS — I739 Peripheral vascular disease, unspecified: Secondary | ICD-10-CM | POA: Diagnosis not present

## 2014-10-21 DIAGNOSIS — I2510 Atherosclerotic heart disease of native coronary artery without angina pectoris: Secondary | ICD-10-CM | POA: Diagnosis not present

## 2014-10-21 NOTE — Progress Notes (Signed)
Primary care physician: Dr. [name] Primary cardiologist: Dr. Tamala Julian  HPI  This is a pleasant 74 year old male who was is here today for a follow up visit regarding peripheral arterial disease. He has known history of coronary artery disease with previous angioplasty and stent placement in 1997. He has prolonged history of type 2 diabetes for at least 25 years with mild chronic kidney disease. He has no history of heart failure. He reports prolonged history of bilateral calf pain with walking over the years which has worsened gradually and affect in his ability to perform activities of daily living and exercise. Noninvasive evaluation showed evidence of distal right SFA occlusion into the popliteal. There was significant stenosis involving the proximal and distal left SFA. ABI was moderately reduced on the right side and not obtainable on the left side due to noncompressible vessels. I proceeded with angiography in May,2015 which showed: 1. Heavily calcified aorta with no evidence of aneurysm or obstructive disease.  2. 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  3. Borderline significant left common iliac artery calcified stenosis. The peak to peak gradient was only between 10-15 mmHg.  4. Heavily calcified and diseased left common femoral artery extending into the ostium of the profunda and SFA.  5. Severe left popliteal arteries stenosis  6. Occluded distal right SFA into proximal popliteal artery.  7. Three-vessel runoff below the knee bilaterally.   He was treated medically for peripheral arterial disease with gradual improvement in claudication with increase physical activities. He swims and plays tennis on a regular basis. He had worsening exertional dyspnea recently with abnormal stress test. This was followed by cardiac catheterization done by Dr. Tamala Julian which showed calcified three-vessel coronary artery disease. The patient was seen by Dr. [name] for CABG. He reports  having one episode of chest pain yesterday while he was swimming which lasted only for a few minutes. He denies any symptoms at rest.    Allergies  Allergen Reactions  . Penicillins Anaphylaxis  . Plavix [Clopidogrel Bisulfate] Hives  . Pletal [Cilostazol] Itching     Current Outpatient Prescriptions on File Prior to Visit  Medication Sig Dispense Refill  . acetaminophen (TYLENOL) 500 MG tablet Take 1,000 mg by mouth daily.    Marland Kitchen allopurinol (ZYLOPRIM) 100 MG tablet TAKE 1 TABLET EVERY DAY 90 tablet 1  . aspirin EC 81 MG tablet Take 1 tablet (81 mg total) by mouth daily. 1 tablet 0  . Blood Glucose Calibration (ACCU-CHEK SMARTVIEW CONTROL) LIQD Use as directed 1 each 1  . Blood Glucose Monitoring Suppl (ACCU-CHEK NANO SMARTVIEW) W/DEVICE KIT Use to check blood sugars 2 times per day dx code 250.02 1 kit 1  . carvedilol (COREG) 3.125 MG tablet Take 1 tablet (3.125 mg total) by mouth 2 (two) times daily. 60 tablet 5  . cholecalciferol (VITAMIN D) 1000 UNITS tablet Take 2,000 Units by mouth daily.    Marland Kitchen Co-Enzyme Q-10 100 MG CAPS Take 100 mg by mouth daily.    . ferrous sulfate 325 (65 FE) MG tablet Take 325 mg by mouth daily.     . fluorouracil (EFUDEX) 5 % cream Apply 1 application topically daily.    . folic acid (FOLVITE) 161 MCG tablet Take 800 mcg by mouth daily.    Marland Kitchen glucose blood (ACCU-CHEK SMARTVIEW) test strip Use as instructed to check blood sugar 2 times per day dx code E11.65 200 each 1  . metFORMIN (GLUCOPHAGE) 1000 MG tablet Take 1 tablet (1,000 mg total) by  mouth 2 (two) times daily with a meal. 180 tablet 3  . MICROLET LANCETS MISC USE ONE LANCET TO CHECK GLUCOSE TWICE DAILY AS DIRECTED DX CODE E11.65 100 each 3  . Multiple Vitamins-Minerals (MEMORY VITE) TABS Take 1 tablet by mouth daily.    . Multiple Vitamins-Minerals (PRESERVISION AREDS 2) CAPS Take 2 capsules by mouth daily.    . nitroGLYCERIN (NITROSTAT) 0.4 MG SL tablet Place 1 tablet (0.4 mg total) under the tongue  every 5 (five) minutes as needed. (Patient taking differently: Place 0.4 mg under the tongue every 5 (five) minutes as needed for chest pain. ) 25 tablet 3  . omega-3 acid ethyl esters (LOVAZA) 1 G capsule Take 1 g by mouth daily.    . sitaGLIPtin (JANUVIA) 100 MG tablet Take 1 tablet (100 mg total) by mouth daily. 30 tablet 5  . tamsulosin (FLOMAX) 0.4 MG CAPS capsule Take 1 capsule (0.4 mg total) by mouth daily. 30 capsule 0  . vitamin B-12 (CYANOCOBALAMIN) 500 MCG tablet Take 1,000 mcg by mouth daily.     No current facility-administered medications on file prior to visit.     Past Medical History  Diagnosis Date  . Essential hypertension, benign   . Other and unspecified hyperlipidemia   . Chronic kidney disease, stage II (mild)   . Type II or unspecified type diabetes mellitus without mention of complication, uncontrolled   . PAD (peripheral artery disease)   . Orthostasis   . CAD (coronary artery disease)     with IMI and BMS 1997  . Gout      Past Surgical History  Procedure Laterality Date  . Hernia repair    . Cholecystectomy    . Cataract extraction    . Melanoma excision    . Skin lesion excision    . Abdominal aortagram N/A 10/09/2013    Procedure: ABDOMINAL Maxcine Ham;  Surgeon: Wellington Hampshire, MD;  Location: [facility] CATH LAB;  Service: Cardiovascular;  Laterality: N/A;  . Cardiac catheterization N/A 10/02/2014    Procedure: Left Heart Cath and Coronary Angiography;  Surgeon: Belva Crome, MD;  Location: [facility] CV LAB;  Service: Cardiovascular;  Laterality: N/A;     Family History  Problem Relation Age of Onset  . Colon cancer Father   . Heart disease Mother      History   Social History  . Marital Status: Married    Spouse Name: N/A  . Number of Children: N/A  . Years of Education: N/A   Occupational History  . Not on file.   Social History Main Topics  . Smoking status: Current Every Day Smoker -- 0.50 packs/day for 49 years    Types:  Cigarettes  . Smokeless tobacco: Never Used  . Alcohol Use: No  . Drug Use: No  . Sexual Activity: Not on file   Other Topics Concern  . Not on file   Social History Narrative     ROS A 10 point review of system was performed. It is negative other than that mentioned in the history of present illness.   PHYSICAL EXAM   BP 124/68 mmHg  Pulse 73  Ht 6' 2"  (1.88 m)  Wt 167 lb 12.8 oz (76.114 kg)  BMI 21.54 kg/m2 Constitutional: He is oriented to person, place, and time. He appears well-developed and well-nourished. No distress.  HENT: No nasal discharge.  Head: Normocephalic and atraumatic.  Eyes: Pupils are equal and round.  No discharge. Neck: Normal range of motion.  Neck supple. No JVD present. No thyromegaly present.  Cardiovascular: Normal rate, regular rhythm, normal heart sounds. Exam reveals no gallop and no friction rub. No murmur heard.  Pulmonary/Chest: Effort normal and breath sounds normal. No stridor. No respiratory distress. He has no wheezes. He has no rales. He exhibits no tenderness.  Abdominal: Soft. Bowel sounds are normal. He exhibits no distension. There is no tenderness. There is no rebound and no guarding.  Musculoskeletal: Normal range of motion. He exhibits no edema and no tenderness.  Neurological: He is alert and oriented to person, place, and time. Coordination normal.  Skin: Skin is warm and dry. No rash noted. He is not diaphoretic. No erythema. No pallor.  Psychiatric: He has a normal mood and affect. His behavior is normal. Judgment and thought content normal.  Vascular: Femoral pulses are normal. Distal pulses are not palpable.      ASSESSMENT AND PLAN

## 2014-10-21 NOTE — Assessment & Plan Note (Signed)
Continue treatment with atorvastatin. His LDL was at target.  Lab Results  Component Value Date   CHOL 116 05/29/2014   HDL 36.70* 05/29/2014   LDLCALC 52 05/29/2014   TRIG 136.0 05/29/2014   CHOLHDL 3 05/29/2014

## 2014-10-21 NOTE — Assessment & Plan Note (Signed)
Blood pressure is controlled on current medications. 

## 2014-10-21 NOTE — Patient Instructions (Signed)
Medication Instructions:  Your physician recommends that you continue on your current medications as directed. Please refer to the Current Medication list given to you today.  Labwork: No new orders.   Testing/Procedures: No new orders.   Follow-Up: Your physician wants you to follow-up in: 1 YEAR with Dr Arida.  You will receive a reminder letter in the mail two months in advance. If you don't receive a letter, please call our office to schedule the follow-up appointment.   Any Other Special Instructions Will Be Listed Below (If Applicable).   

## 2014-10-21 NOTE — Assessment & Plan Note (Signed)
I agree with CABG given severe calcifications. Fortunately, CT scan of the chest showed that the ascending aorta was not very calcified.

## 2014-10-21 NOTE — Assessment & Plan Note (Signed)
His claudication has improved gradually without intervention. I recommend continuing medical therapy. Follow-up with me on a yearly basis or earlier if needed.

## 2014-10-30 ENCOUNTER — Ambulatory Visit (HOSPITAL_COMMUNITY)
Admission: RE | Admit: 2014-10-30 | Discharge: 2014-10-30 | Disposition: A | Discharge status: Home / Self Care | Payer: Commercial Managed Care - HMO | Source: Ambulatory Visit | Attending: Surgery | Admitting: Surgery

## 2014-10-30 ENCOUNTER — Encounter (HOSPITAL_COMMUNITY): Payer: Self-pay

## 2014-10-30 ENCOUNTER — Encounter (HOSPITAL_COMMUNITY)
Admission: RE | Admit: 2014-10-30 | Discharge: 2014-10-30 | Disposition: A | Discharge status: Home / Self Care | Payer: Commercial Managed Care - HMO | Source: Ambulatory Visit | Attending: Surgery | Admitting: Surgery

## 2014-10-30 ENCOUNTER — Encounter (HOSPITAL_COMMUNITY): Admission: RE | Admit: 2014-10-30 | Payer: Commercial Managed Care - HMO | Source: Ambulatory Visit

## 2014-10-30 VITALS — BP 132/64 | HR 65 | Temp 96.8°F | Resp 16 | Ht 74.0 in | Wt 167.2 lb

## 2014-10-30 DIAGNOSIS — I2510 Atherosclerotic heart disease of native coronary artery without angina pectoris: Secondary | ICD-10-CM | POA: Insufficient documentation

## 2014-10-30 DIAGNOSIS — Z01818 Encounter for other preprocedural examination: Secondary | ICD-10-CM | POA: Diagnosis not present

## 2014-10-30 DIAGNOSIS — I440 Atrioventricular block, first degree: Secondary | ICD-10-CM | POA: Insufficient documentation

## 2014-10-30 DIAGNOSIS — Z0183 Encounter for blood typing: Secondary | ICD-10-CM | POA: Diagnosis not present

## 2014-10-30 DIAGNOSIS — Z01812 Encounter for preprocedural laboratory examination: Secondary | ICD-10-CM | POA: Diagnosis not present

## 2014-10-30 HISTORY — DX: Pneumonia, unspecified organism: J18.9

## 2014-10-30 HISTORY — DX: Unspecified osteoarthritis, unspecified site: M19.90

## 2014-10-30 LAB — PULMONARY FUNCTION TEST
DL/VA % pred: 64 %
DL/VA: 3.07 ml/min/mmHg/L
DLCO unc % pred: 59 %
DLCO unc: 21.47 ml/min/mmHg
FEF 25-75 Post: 1.49 L/sec
FEF 25-75 Pre: 1.17 L/sec
FEF2575-%Change-Post: 27 %
FEF2575-%Pred-Post: 57 %
FEF2575-%Pred-Pre: 45 %
FEV1-%Change-Post: 7 %
FEV1-%Pred-Post: 91 %
FEV1-%Pred-Pre: 84 %
FEV1-Post: 3.21 L
FEV1-Pre: 2.97 L
FEV1FVC-%Change-Post: 3 %
FEV1FVC-%Pred-Pre: 73 %
FEV6-%Change-Post: 6 %
FEV6-%Pred-Post: 115 %
FEV6-%Pred-Pre: 108 %
FEV6-Post: 5.23 L
FEV6-Pre: 4.92 L
FEV6FVC-%Change-Post: 2 %
FEV6FVC-%Pred-Post: 97 %
FEV6FVC-%Pred-Pre: 95 %
FVC-%Change-Post: 3 %
FVC-%Pred-Post: 119 %
FVC-%Pred-Pre: 114 %
FVC-Post: 5.73 L
FVC-Pre: 5.51 L
Post FEV1/FVC ratio: 56 %
Post FEV6/FVC ratio: 91 %
Pre FEV1/FVC ratio: 54 %
Pre FEV6/FVC Ratio: 89 %
RV % pred: 79 %
RV: 2.14 L
TLC % pred: 104 %
TLC: 8.02 L

## 2014-10-30 LAB — CBC
HCT: 34.9 % — ABNORMAL LOW (ref 39.0–52.0)
Hemoglobin: 11.8 g/dL — ABNORMAL LOW (ref 13.0–17.0)
MCH: 29.5 pg (ref 26.0–34.0)
MCHC: 33.8 g/dL (ref 30.0–36.0)
MCV: 87.3 fL (ref 78.0–100.0)
Platelets: 131 10*3/uL — ABNORMAL LOW (ref 150–400)
RBC: 4 MIL/uL — ABNORMAL LOW (ref 4.22–5.81)
RDW: 14.2 % (ref 11.5–15.5)
WBC: 6 10*3/uL (ref 4.0–10.5)

## 2014-10-30 LAB — COMPREHENSIVE METABOLIC PANEL
ALT: 44 U/L (ref 17–63)
AST: 29 U/L (ref 15–41)
Albumin: 3.8 g/dL (ref 3.5–5.0)
Alkaline Phosphatase: 67 U/L (ref 38–126)
Anion gap: 9 (ref 5–15)
BUN: 25 mg/dL — ABNORMAL HIGH (ref 6–20)
CO2: 16 mmol/L — ABNORMAL LOW (ref 22–32)
Calcium: 8.9 mg/dL (ref 8.9–10.3)
Chloride: 110 mmol/L (ref 101–111)
Creatinine, Ser: 1.09 mg/dL (ref 0.61–1.24)
GFR calc Af Amer: >60 mL/min (ref >60)
GFR calc non Af Amer: >60 mL/min (ref >60)
Glucose, Bld: 211 mg/dL — ABNORMAL HIGH (ref 65–99)
Potassium: 4.4 mmol/L (ref 3.5–5.1)
Sodium: 135 mmol/L (ref 135–145)
Total Bilirubin: 1 mg/dL (ref 0.3–1.2)
Total Protein: 5.9 g/dL — ABNORMAL LOW (ref 6.5–8.1)

## 2014-10-30 LAB — PROTIME-INR
INR: 1.12 (ref 0.00–1.49)
Prothrombin Time: 14.6 seconds (ref 11.6–15.2)

## 2014-10-30 LAB — BLOOD GAS, ARTERIAL
Acid-base deficit: 6.4 mmol/L — ABNORMAL HIGH (ref 0.0–2.0)
Bicarbonate: 17.6 mEq/L — ABNORMAL LOW (ref 20.0–24.0)
Drawn by: 206361
FIO2: 0.21 %
O2 Saturation: 97.8 %
Patient temperature: 98.6
TCO2: 18.5 mmol/L (ref 0–100)
pCO2 arterial: 29.6 mmHg — ABNORMAL LOW (ref 35.0–45.0)
pH, Arterial: 7.392 (ref 7.350–7.450)
pO2, Arterial: 100 mmHg (ref 80.0–100.0)

## 2014-10-30 LAB — APTT: aPTT: 30 seconds (ref 24–37)

## 2014-10-30 LAB — SURGICAL PCR SCREEN
MRSA, PCR: NEGATIVE
Staphylococcus aureus: POSITIVE — AB

## 2014-10-30 LAB — GLUCOSE, CAPILLARY: Glucose-Capillary: 199 mg/dL — ABNORMAL HIGH (ref 65–99)

## 2014-10-30 LAB — ABO/RH: ABO/RH(D): O POS

## 2014-10-30 MED ORDER — ALBUTEROL SULFATE (2.5 MG/3ML) 0.083% IN NEBU
2.5000 mg | INHALATION_SOLUTION | Freq: Once | RESPIRATORY_TRACT | Status: AC
  Administered 2014-10-30: 2.5 mg via RESPIRATORY_TRACT

## 2014-10-30 NOTE — Progress Notes (Signed)
PCP is [employer]. Patient denied having any acute cardiac or pulmonary issues. Nurse inquired about fasting glucose and patient informed Nurse that he checked his blood sugar this morning and it was 146. Patient stated the highest his blood sugar has been was 130 and the lowest was 96. CBG on arrival to PAT was 199.   Patient unable to urinate at this time. Patient stated "I just went to the bathroom before I came in here." Nurse will order urinalysis STAT for DOS. Patient also left PAT before having chest xray, therefore Xray will also need to be done DOS.

## 2014-10-30 NOTE — Progress Notes (Signed)
Pre-op Cardiac Surgery  Carotid Findings:  1-39% ICA stenosis.  Vertebral artery flow is antegrade.     Upper Extremity Right Left  Brachial Pressures 132T 134T  Radial Waveforms T T  Ulnar Waveforms T T  Palmar Arch (Allen's Test) WNL WNL   Findings:      Lower  Extremity Right Left  Dorsalis Pedis    Anterior Tibial M DM  Posterior Tibial DM DM  Ankle/Brachial Indices >300 >300    Findings:  ABIs not ascertained secondary to non compressible vessels.

## 2014-10-30 NOTE — Progress Notes (Signed)
Patient left pre-admit before having chest xray, therefore chest xray will be done DOS.

## 2014-10-30 NOTE — Pre-Procedure Instructions (Signed)
Arpan Eskelson  10/30/2014     Your procedure is scheduled on: Monday November 03, 2014 at 7:30 AM.  Report to [facility] Admitting at 5:30 A.M.  Call this number if you have problems the morning of surgery: [phone]   Remember:  Do not eat food or drink liquids after midnight.  Take these medicines the morning of surgery with A SIP OF WATER: Acetaminophen (Tylenol), Allopurinol (Zyloprim), Carvedilol (Coreg), Tamsulosin (Flomax)   Do NOT take any diabetic medications the morning of your surgery   Please stop taking any vitamins, herbal medications, Fish oil, Ibuprofen, Advil, Motrin, Aleve, etc   Do not wear jewelry.  Do not wear lotions, powders, or cologne.   Men may shave face and neck.  Do not bring valuables to the hospital.  [facility] is not responsible for any belongings or valuables.  Contacts, dentures or bridgework may not be worn into surgery.  Leave your suitcase in the car.  After surgery it may be brought to your room.  For patients admitted to the hospital, discharge time will be determined by your treatment team.  Patients discharged the day of surgery will not be allowed to drive home.   Name and phone number of your driver:    Special instructions:  Shower using CHG soap the night before and the morning of your surgery  Please read over the following fact sheets that you were given. Pain Booklet, Coughing and Deep Breathing, Blood Transfusion Information, Open Heart Packet, MRSA Information and Surgical Site Infection Prevention

## 2014-10-30 NOTE — Progress Notes (Signed)
Nurse called [facility] and then called patient and informed him of positive PCR results. Patient instructed to pick up ointment at his earliest convenience, use it twice daily, and bring ointment in to hospital with patient on Monday. Patient verbalized understanding.

## 2014-10-31 LAB — HEMOGLOBIN A1C
Hgb A1c MFr Bld: 7.2 % — ABNORMAL HIGH (ref 4.8–5.6)
Mean Plasma Glucose: 160 mg/dL

## 2014-11-02 MED ORDER — MAGNESIUM SULFATE 50 % IJ SOLN
40.0000 meq | INTRAMUSCULAR | Status: DC
  Filled 2014-11-02: qty 10

## 2014-11-02 MED ORDER — PHENYLEPHRINE HCL 10 MG/ML IJ SOLN
30.0000 ug/min | INTRAVENOUS | Status: AC
  Administered 2014-11-03: 25 ug/min via INTRAVENOUS
  Filled 2014-11-02: qty 2

## 2014-11-02 MED ORDER — LEVOFLOXACIN IN D5W 500 MG/100ML IV SOLN
500.0000 mg | INTRAVENOUS | Status: AC
  Administered 2014-11-03: 500 mg via INTRAVENOUS
  Filled 2014-11-02: qty 100

## 2014-11-02 MED ORDER — SODIUM CHLORIDE 0.9 % IV SOLN
INTRAVENOUS | Status: AC
  Administered 2014-11-03: 69.8 mL/h via INTRAVENOUS
  Filled 2014-11-02 (×2): qty 40

## 2014-11-02 MED ORDER — PLASMA-LYTE 148 IV SOLN
INTRAVENOUS | Status: AC
  Administered 2014-11-03: 09:00:00
  Filled 2014-11-02: qty 2.5

## 2014-11-02 MED ORDER — POTASSIUM CHLORIDE 2 MEQ/ML IV SOLN
80.0000 meq | INTRAVENOUS | Status: DC
  Filled 2014-11-02: qty 40

## 2014-11-02 MED ORDER — DOPAMINE-DEXTROSE 3.2-5 MG/ML-% IV SOLN
0.0000 ug/kg/min | INTRAVENOUS | Status: DC
  Filled 2014-11-02: qty 250

## 2014-11-02 MED ORDER — EPINEPHRINE HCL 1 MG/ML IJ SOLN
0.0000 ug/min | INTRAVENOUS | Status: DC
  Filled 2014-11-02: qty 4

## 2014-11-02 MED ORDER — VANCOMYCIN HCL 10 G IV SOLR
1250.0000 mg | INTRAVENOUS | Status: AC
  Administered 2014-11-03: 1250 mg via INTRAVENOUS
  Filled 2014-11-02: qty 1250

## 2014-11-02 MED ORDER — NITROGLYCERIN IN D5W 200-5 MCG/ML-% IV SOLN
2.0000 ug/min | INTRAVENOUS | Status: AC
  Administered 2014-11-03: 5 ug/min via INTRAVENOUS
  Filled 2014-11-02: qty 250

## 2014-11-02 MED ORDER — DEXMEDETOMIDINE HCL IN NACL 400 MCG/100ML IV SOLN
0.1000 ug/kg/h | INTRAVENOUS | Status: AC
  Administered 2014-11-03: .2 ug/kg/h via INTRAVENOUS
  Filled 2014-11-02: qty 100

## 2014-11-02 MED ORDER — SODIUM CHLORIDE 0.9 % IV SOLN
INTRAVENOUS | Status: AC
  Administered 2014-11-03: 1.2 [IU]/h via INTRAVENOUS
  Filled 2014-11-02: qty 2.5

## 2014-11-02 MED ORDER — SODIUM CHLORIDE 0.9 % IV SOLN
INTRAVENOUS | Status: DC
  Filled 2014-11-02: qty 30

## 2014-11-03 ENCOUNTER — Other Ambulatory Visit: Payer: Self-pay

## 2014-11-03 ENCOUNTER — Inpatient Hospital Stay (HOSPITAL_COMMUNITY): Payer: Commercial Managed Care - HMO

## 2014-11-03 ENCOUNTER — Encounter (HOSPITAL_COMMUNITY)
Admission: RE | Disposition: A | Discharge status: Home / Self Care | Payer: Commercial Managed Care - HMO | Source: Ambulatory Visit | Attending: Surgery

## 2014-11-03 ENCOUNTER — Encounter (HOSPITAL_COMMUNITY): Payer: Self-pay | Admitting: Certified Registered"

## 2014-11-03 ENCOUNTER — Inpatient Hospital Stay (HOSPITAL_COMMUNITY)
Admission: RE | Admit: 2014-11-03 | Discharge: 2014-11-10 | DRG: 236 | Disposition: A | Discharge status: Home / Self Care | Payer: Commercial Managed Care - HMO | Source: Ambulatory Visit | Attending: Surgery | Admitting: Surgery

## 2014-11-03 ENCOUNTER — Inpatient Hospital Stay (HOSPITAL_COMMUNITY): Payer: Commercial Managed Care - HMO | Admitting: Certified Registered"

## 2014-11-03 ENCOUNTER — Ambulatory Visit (HOSPITAL_COMMUNITY)
Admission: RE | Admit: 2014-11-03 | Discharge: 2014-11-03 | Disposition: A | Discharge status: Home / Self Care | Payer: Commercial Managed Care - HMO | Source: Ambulatory Visit | Attending: Surgery | Admitting: Surgery

## 2014-11-03 DIAGNOSIS — J449 Chronic obstructive pulmonary disease, unspecified: Secondary | ICD-10-CM | POA: Diagnosis present

## 2014-11-03 DIAGNOSIS — J90 Pleural effusion, not elsewhere classified: Secondary | ICD-10-CM | POA: Diagnosis not present

## 2014-11-03 DIAGNOSIS — I2510 Atherosclerotic heart disease of native coronary artery without angina pectoris: Principal | ICD-10-CM | POA: Diagnosis present

## 2014-11-03 DIAGNOSIS — I25110 Atherosclerotic heart disease of native coronary artery with unstable angina pectoris: Secondary | ICD-10-CM

## 2014-11-03 DIAGNOSIS — Z79899 Other long term (current) drug therapy: Secondary | ICD-10-CM

## 2014-11-03 DIAGNOSIS — N182 Chronic kidney disease, stage 2 (mild): Secondary | ICD-10-CM | POA: Diagnosis present

## 2014-11-03 DIAGNOSIS — I4891 Unspecified atrial fibrillation: Secondary | ICD-10-CM | POA: Diagnosis not present

## 2014-11-03 DIAGNOSIS — J9589 Other postprocedural complications and disorders of respiratory system, not elsewhere classified: Secondary | ICD-10-CM | POA: Diagnosis not present

## 2014-11-03 DIAGNOSIS — Z8249 Family history of ischemic heart disease and other diseases of the circulatory system: Secondary | ICD-10-CM | POA: Diagnosis not present

## 2014-11-03 DIAGNOSIS — E8770 Fluid overload, unspecified: Secondary | ICD-10-CM | POA: Diagnosis not present

## 2014-11-03 DIAGNOSIS — E785 Hyperlipidemia, unspecified: Secondary | ICD-10-CM | POA: Diagnosis present

## 2014-11-03 DIAGNOSIS — Z01818 Encounter for other preprocedural examination: Secondary | ICD-10-CM

## 2014-11-03 DIAGNOSIS — I129 Hypertensive chronic kidney disease with stage 1 through stage 4 chronic kidney disease, or unspecified chronic kidney disease: Secondary | ICD-10-CM | POA: Diagnosis present

## 2014-11-03 DIAGNOSIS — Z888 Allergy status to other drugs, medicaments and biological substances status: Secondary | ICD-10-CM | POA: Diagnosis not present

## 2014-11-03 DIAGNOSIS — I708 Atherosclerosis of other arteries: Secondary | ICD-10-CM | POA: Diagnosis present

## 2014-11-03 DIAGNOSIS — D62 Acute posthemorrhagic anemia: Secondary | ICD-10-CM | POA: Diagnosis not present

## 2014-11-03 DIAGNOSIS — I25810 Atherosclerosis of coronary artery bypass graft(s) without angina pectoris: Secondary | ICD-10-CM | POA: Diagnosis present

## 2014-11-03 DIAGNOSIS — Z880 Allergy status to penicillin: Secondary | ICD-10-CM | POA: Diagnosis not present

## 2014-11-03 DIAGNOSIS — J9811 Atelectasis: Secondary | ICD-10-CM | POA: Diagnosis not present

## 2014-11-03 DIAGNOSIS — E1122 Type 2 diabetes mellitus with diabetic chronic kidney disease: Secondary | ICD-10-CM | POA: Diagnosis present

## 2014-11-03 DIAGNOSIS — Z951 Presence of aortocoronary bypass graft: Secondary | ICD-10-CM

## 2014-11-03 DIAGNOSIS — M109 Gout, unspecified: Secondary | ICD-10-CM | POA: Diagnosis present

## 2014-11-03 DIAGNOSIS — I700 Atherosclerosis of aorta: Secondary | ICD-10-CM | POA: Diagnosis present

## 2014-11-03 DIAGNOSIS — I739 Peripheral vascular disease, unspecified: Secondary | ICD-10-CM | POA: Diagnosis present

## 2014-11-03 DIAGNOSIS — D696 Thrombocytopenia, unspecified: Secondary | ICD-10-CM | POA: Diagnosis not present

## 2014-11-03 DIAGNOSIS — R0602 Shortness of breath: Secondary | ICD-10-CM

## 2014-11-03 DIAGNOSIS — Z7982 Long term (current) use of aspirin: Secondary | ICD-10-CM | POA: Diagnosis not present

## 2014-11-03 DIAGNOSIS — F17210 Nicotine dependence, cigarettes, uncomplicated: Secondary | ICD-10-CM | POA: Diagnosis present

## 2014-11-03 HISTORY — PX: TEE WITHOUT CARDIOVERSION: SHX5443

## 2014-11-03 HISTORY — PX: CORONARY ARTERY BYPASS GRAFT: SHX141

## 2014-11-03 LAB — CREATININE, SERUM
Creatinine, Ser: 1.01 mg/dL (ref 0.61–1.24)
GFR calc Af Amer: >60 mL/min (ref >60)
GFR calc non Af Amer: >60 mL/min (ref >60)

## 2014-11-03 LAB — POCT I-STAT, CHEM 8
BUN: 19 mg/dL (ref 6–20)
BUN: 17 mg/dL (ref 6–20)
BUN: 19 mg/dL (ref 6–20)
BUN: 18 mg/dL (ref 6–20)
BUN: 17 mg/dL (ref 6–20)
BUN: 18 mg/dL (ref 6–20)
Calcium, Ion: 1.32 mmol/L — ABNORMAL HIGH (ref 1.13–1.30)
Calcium, Ion: 1.11 mmol/L — ABNORMAL LOW (ref 1.13–1.30)
Calcium, Ion: 1.33 mmol/L — ABNORMAL HIGH (ref 1.13–1.30)
Calcium, Ion: 1.04 mmol/L — ABNORMAL LOW (ref 1.13–1.30)
Calcium, Ion: 1.13 mmol/L (ref 1.13–1.30)
Calcium, Ion: 1.19 mmol/L (ref 1.13–1.30)
Chloride: 108 mmol/L (ref 101–111)
Chloride: 106 mmol/L (ref 101–111)
Chloride: 108 mmol/L (ref 101–111)
Chloride: 105 mmol/L (ref 101–111)
Chloride: 105 mmol/L (ref 101–111)
Chloride: 108 mmol/L (ref 101–111)
Creatinine, Ser: 0.9 mg/dL (ref 0.61–1.24)
Creatinine, Ser: 0.9 mg/dL (ref 0.61–1.24)
Creatinine, Ser: 0.9 mg/dL (ref 0.61–1.24)
Creatinine, Ser: 1 mg/dL (ref 0.61–1.24)
Creatinine, Ser: 1 mg/dL (ref 0.61–1.24)
Creatinine, Ser: 1 mg/dL (ref 0.61–1.24)
Glucose, Bld: 121 mg/dL — ABNORMAL HIGH (ref 65–99)
Glucose, Bld: 102 mg/dL — ABNORMAL HIGH (ref 65–99)
Glucose, Bld: 115 mg/dL — ABNORMAL HIGH (ref 65–99)
Glucose, Bld: 139 mg/dL — ABNORMAL HIGH (ref 65–99)
Glucose, Bld: 161 mg/dL — ABNORMAL HIGH (ref 65–99)
Glucose, Bld: 149 mg/dL — ABNORMAL HIGH (ref 65–99)
HCT: 30 % — ABNORMAL LOW (ref 39.0–52.0)
HCT: 27 % — ABNORMAL LOW (ref 39.0–52.0)
HCT: 30 % — ABNORMAL LOW (ref 39.0–52.0)
HCT: 22 % — ABNORMAL LOW (ref 39.0–52.0)
HCT: 21 % — ABNORMAL LOW (ref 39.0–52.0)
HCT: 29 % — ABNORMAL LOW (ref 39.0–52.0)
Hemoglobin: 10.2 g/dL — ABNORMAL LOW (ref 13.0–17.0)
Hemoglobin: 10.2 g/dL — ABNORMAL LOW (ref 13.0–17.0)
Hemoglobin: 9.2 g/dL — ABNORMAL LOW (ref 13.0–17.0)
Hemoglobin: 7.5 g/dL — ABNORMAL LOW (ref 13.0–17.0)
Hemoglobin: 7.1 g/dL — ABNORMAL LOW (ref 13.0–17.0)
Hemoglobin: 9.9 g/dL — ABNORMAL LOW (ref 13.0–17.0)
Potassium: 4.1 mmol/L (ref 3.5–5.1)
Potassium: 4.4 mmol/L (ref 3.5–5.1)
Potassium: 4.4 mmol/L (ref 3.5–5.1)
Potassium: 5.6 mmol/L — ABNORMAL HIGH (ref 3.5–5.1)
Potassium: 5.1 mmol/L (ref 3.5–5.1)
Potassium: 4 mmol/L (ref 3.5–5.1)
Sodium: 141 mmol/L (ref 135–145)
Sodium: 139 mmol/L (ref 135–145)
Sodium: 140 mmol/L (ref 135–145)
Sodium: 137 mmol/L (ref 135–145)
Sodium: 139 mmol/L (ref 135–145)
Sodium: 140 mmol/L (ref 135–145)
TCO2: 19 mmol/L (ref 0–100)
TCO2: 19 mmol/L (ref 0–100)
TCO2: 20 mmol/L (ref 0–100)
TCO2: 22 mmol/L (ref 0–100)
TCO2: 20 mmol/L (ref 0–100)
TCO2: 19 mmol/L (ref 0–100)

## 2014-11-03 LAB — CBC
HCT: 27.5 % — ABNORMAL LOW (ref 39.0–52.0)
HCT: 30.9 % — ABNORMAL LOW (ref 39.0–52.0)
Hemoglobin: 9.1 g/dL — ABNORMAL LOW (ref 13.0–17.0)
Hemoglobin: 10.5 g/dL — ABNORMAL LOW (ref 13.0–17.0)
MCH: 28.8 pg (ref 26.0–34.0)
MCH: 29.7 pg (ref 26.0–34.0)
MCHC: 33.1 g/dL (ref 30.0–36.0)
MCHC: 34 g/dL (ref 30.0–36.0)
MCV: 87 fL (ref 78.0–100.0)
MCV: 87.3 fL (ref 78.0–100.0)
Platelets: 102 10*3/uL — ABNORMAL LOW (ref 150–400)
Platelets: 108 10*3/uL — ABNORMAL LOW (ref 150–400)
RBC: 3.16 MIL/uL — ABNORMAL LOW (ref 4.22–5.81)
RBC: 3.54 MIL/uL — ABNORMAL LOW (ref 4.22–5.81)
RDW: 14 % (ref 11.5–15.5)
RDW: 14.1 % (ref 11.5–15.5)
WBC: 6.5 10*3/uL (ref 4.0–10.5)
WBC: 8.5 10*3/uL (ref 4.0–10.5)

## 2014-11-03 LAB — POCT I-STAT 3, ART BLOOD GAS (G3+)
Acid-Base Excess: 2 mmol/L (ref 0.0–2.0)
Acid-base deficit: 7 mmol/L — ABNORMAL HIGH (ref 0.0–2.0)
Acid-base deficit: 4 mmol/L — ABNORMAL HIGH (ref 0.0–2.0)
Acid-base deficit: 3 mmol/L — ABNORMAL HIGH (ref 0.0–2.0)
Acid-base deficit: 4 mmol/L — ABNORMAL HIGH (ref 0.0–2.0)
Acid-base deficit: 4 mmol/L — ABNORMAL HIGH (ref 0.0–2.0)
Bicarbonate: 20.8 mEq/L (ref 20.0–24.0)
Bicarbonate: 25.4 mEq/L — ABNORMAL HIGH (ref 20.0–24.0)
Bicarbonate: 22 mEq/L (ref 20.0–24.0)
Bicarbonate: 22.3 mEq/L (ref 20.0–24.0)
Bicarbonate: 20.8 mEq/L (ref 20.0–24.0)
Bicarbonate: 20.2 mEq/L (ref 20.0–24.0)
O2 Saturation: 100 %
O2 Saturation: 100 %
O2 Saturation: 100 %
O2 Saturation: 96 %
O2 Saturation: 99 %
O2 Saturation: 92 %
Patient temperature: 36.2
Patient temperature: 36.7
Patient temperature: 36.9
TCO2: 22 mmol/L (ref 0–100)
TCO2: 26 mmol/L (ref 0–100)
TCO2: 23 mmol/L (ref 0–100)
TCO2: 24 mmol/L (ref 0–100)
TCO2: 22 mmol/L (ref 0–100)
TCO2: 21 mmol/L (ref 0–100)
pCO2 arterial: 55 mmHg — ABNORMAL HIGH (ref 35.0–45.0)
pCO2 arterial: 35.3 mmHg (ref 35.0–45.0)
pCO2 arterial: 43.1 mmHg (ref 35.0–45.0)
pCO2 arterial: 41.2 mmHg (ref 35.0–45.0)
pCO2 arterial: 34 mmHg — ABNORMAL LOW (ref 35.0–45.0)
pCO2 arterial: 33.8 mmHg — ABNORMAL LOW (ref 35.0–45.0)
pH, Arterial: 7.186 — CL (ref 7.350–7.450)
pH, Arterial: 7.465 — ABNORMAL HIGH (ref 7.350–7.450)
pH, Arterial: 7.315 — ABNORMAL LOW (ref 7.350–7.450)
pH, Arterial: 7.338 — ABNORMAL LOW (ref 7.350–7.450)
pH, Arterial: 7.394 (ref 7.350–7.450)
pH, Arterial: 7.384 (ref 7.350–7.450)
pO2, Arterial: 414 mmHg — ABNORMAL HIGH (ref 80.0–100.0)
pO2, Arterial: 304 mmHg — ABNORMAL HIGH (ref 80.0–100.0)
pO2, Arterial: 278 mmHg — ABNORMAL HIGH (ref 80.0–100.0)
pO2, Arterial: 84 mmHg (ref 80.0–100.0)
pO2, Arterial: 126 mmHg — ABNORMAL HIGH (ref 80.0–100.0)
pO2, Arterial: 62 mmHg — ABNORMAL LOW (ref 80.0–100.0)

## 2014-11-03 LAB — APTT: aPTT: 34 seconds (ref 24–37)

## 2014-11-03 LAB — GLUCOSE, CAPILLARY
Glucose-Capillary: 112 mg/dL — ABNORMAL HIGH (ref 65–99)
Glucose-Capillary: 94 mg/dL (ref 65–99)
Glucose-Capillary: 110 mg/dL — ABNORMAL HIGH (ref 65–99)

## 2014-11-03 LAB — POCT I-STAT 4, (NA,K, GLUC, HGB,HCT)
Glucose, Bld: 116 mg/dL — ABNORMAL HIGH (ref 65–99)
HCT: 24 % — ABNORMAL LOW (ref 39.0–52.0)
Hemoglobin: 8.2 g/dL — ABNORMAL LOW (ref 13.0–17.0)
Potassium: 4.3 mmol/L (ref 3.5–5.1)
Sodium: 141 mmol/L (ref 135–145)

## 2014-11-03 LAB — MAGNESIUM: Magnesium: 2.8 mg/dL — ABNORMAL HIGH (ref 1.7–2.4)

## 2014-11-03 LAB — HEMOGLOBIN AND HEMATOCRIT, BLOOD
HCT: 22.4 % — ABNORMAL LOW (ref 39.0–52.0)
Hemoglobin: 7.5 g/dL — ABNORMAL LOW (ref 13.0–17.0)

## 2014-11-03 LAB — PLATELET COUNT: Platelets: 89 10*3/uL — ABNORMAL LOW (ref 150–400)

## 2014-11-03 LAB — PROTIME-INR
INR: 1.51 — ABNORMAL HIGH (ref 0.00–1.49)
Prothrombin Time: 18.3 seconds — ABNORMAL HIGH (ref 11.6–15.2)

## 2014-11-03 SURGERY — CORONARY ARTERY BYPASS GRAFTING (CABG)
Anesthesia: General | Site: Chest

## 2014-11-03 MED ORDER — ASPIRIN EC 325 MG PO TBEC
325.0000 mg | DELAYED_RELEASE_TABLET | Freq: Every day | ORAL | Status: DC
  Administered 2014-11-04: 325 mg via ORAL
  Filled 2014-11-03: qty 1

## 2014-11-03 MED ORDER — METOPROLOL TARTRATE 25 MG/10 ML ORAL SUSPENSION
12.5000 mg | Freq: Two times a day (BID) | ORAL | Status: DC
  Filled 2014-11-03 (×3): qty 5

## 2014-11-03 MED ORDER — ACETAMINOPHEN 160 MG/5ML PO SOLN
1000.0000 mg | Freq: Four times a day (QID) | ORAL | Status: DC
  Filled 2014-11-03: qty 40

## 2014-11-03 MED ORDER — NITROGLYCERIN IN D5W 200-5 MCG/ML-% IV SOLN
0.0000 ug/min | INTRAVENOUS | Status: DC
Start: 2014-11-03 — End: 2014-11-04
  Administered 2014-11-03: 0 ug/min via INTRAVENOUS

## 2014-11-03 MED ORDER — MIDAZOLAM HCL 2 MG/2ML IJ SOLN: 2.0000 mg | INTRAMUSCULAR | Status: DC | PRN

## 2014-11-03 MED ORDER — FENTANYL CITRATE (PF) 250 MCG/5ML IJ SOLN
INTRAMUSCULAR | Status: AC
  Filled 2014-11-03: qty 5

## 2014-11-03 MED ORDER — LIDOCAINE HCL (CARDIAC) 20 MG/ML IV SOLN
INTRAVENOUS | Status: DC | PRN
  Administered 2014-11-03: 60 mg via INTRAVENOUS

## 2014-11-03 MED ORDER — POTASSIUM CHLORIDE 10 MEQ/50ML IV SOLN: 10.0000 meq | INTRAVENOUS | Status: AC

## 2014-11-03 MED ORDER — METOPROLOL TARTRATE 12.5 MG HALF TABLET: 12.5000 mg | ORAL_TABLET | Freq: Once | ORAL | Status: DC

## 2014-11-03 MED ORDER — SODIUM CHLORIDE 0.9 % IV SOLN: Freq: Once | INTRAVENOUS | Status: DC

## 2014-11-03 MED ORDER — OMEGA-3-ACID ETHYL ESTERS 1 G PO CAPS
1.0000 g | ORAL_CAPSULE | Freq: Every day | ORAL | Status: DC
  Administered 2014-11-05 – 2014-11-10 (×6): 1 g via ORAL
  Filled 2014-11-03 (×6): qty 1

## 2014-11-03 MED ORDER — VANCOMYCIN HCL IN DEXTROSE 1-5 GM/200ML-% IV SOLN
1000.0000 mg | Freq: Once | INTRAVENOUS | Status: AC
  Administered 2014-11-03: 1000 mg via INTRAVENOUS
  Filled 2014-11-03: qty 200

## 2014-11-03 MED ORDER — ARTIFICIAL TEARS OP OINT
TOPICAL_OINTMENT | OPHTHALMIC | Status: DC | PRN
  Administered 2014-11-03: 1 via OPHTHALMIC

## 2014-11-03 MED ORDER — FOLIC ACID 1 MG PO TABS
1.0000 mg | ORAL_TABLET | Freq: Every day | ORAL | Status: DC
  Administered 2014-11-04 – 2014-11-10 (×7): 1 mg via ORAL
  Filled 2014-11-03 (×7): qty 1

## 2014-11-03 MED ORDER — ROCURONIUM BROMIDE 100 MG/10ML IV SOLN
INTRAVENOUS | Status: DC | PRN
  Administered 2014-11-03 (×2): 50 mg via INTRAVENOUS

## 2014-11-03 MED ORDER — LEVOFLOXACIN IN D5W 750 MG/150ML IV SOLN
750.0000 mg | INTRAVENOUS | Status: AC
  Administered 2014-11-04: 750 mg via INTRAVENOUS
  Filled 2014-11-03: qty 150

## 2014-11-03 MED ORDER — VECURONIUM BROMIDE 10 MG IV SOLR
INTRAVENOUS | Status: AC
  Filled 2014-11-03: qty 20

## 2014-11-03 MED ORDER — ARTIFICIAL TEARS OP OINT
TOPICAL_OINTMENT | OPHTHALMIC | Status: AC
  Filled 2014-11-03: qty 3.5

## 2014-11-03 MED ORDER — BROMOCRIPTINE MESYLATE 2.5 MG PO TABS
3.7500 mg | ORAL_TABLET | Freq: Every day | ORAL | Status: DC
  Administered 2014-11-05 – 2014-11-10 (×6): 3.75 mg via ORAL
  Filled 2014-11-03 (×6): qty 2

## 2014-11-03 MED ORDER — MORPHINE SULFATE 2 MG/ML IJ SOLN
1.0000 mg | INTRAMUSCULAR | Status: DC | PRN
  Administered 2014-11-03 (×2): 2 mg via INTRAVENOUS

## 2014-11-03 MED ORDER — LACTATED RINGERS IV SOLN: 500.0000 mL | Freq: Once | INTRAVENOUS | Status: AC | PRN

## 2014-11-03 MED ORDER — FERROUS SULFATE 325 (65 FE) MG PO TABS
325.0000 mg | ORAL_TABLET | Freq: Every day | ORAL | Status: DC
  Administered 2014-11-05 – 2014-11-10 (×6): 325 mg via ORAL
  Filled 2014-11-03 (×7): qty 1

## 2014-11-03 MED ORDER — HEMOSTATIC AGENTS (NO CHARGE) OPTIME
TOPICAL | Status: DC | PRN
  Administered 2014-11-03: 1 via TOPICAL

## 2014-11-03 MED ORDER — ATORVASTATIN CALCIUM 40 MG PO TABS
40.0000 mg | ORAL_TABLET | Freq: Every day | ORAL | Status: DC
  Administered 2014-11-04 – 2014-11-09 (×6): 40 mg via ORAL
  Filled 2014-11-03 (×7): qty 1

## 2014-11-03 MED ORDER — MIDAZOLAM HCL 10 MG/2ML IJ SOLN
INTRAMUSCULAR | Status: AC
  Filled 2014-11-03: qty 2

## 2014-11-03 MED ORDER — DEXTROSE 5 % IV SOLN
0.0000 ug/min | INTRAVENOUS | Status: DC
  Filled 2014-11-03: qty 2

## 2014-11-03 MED ORDER — DOCUSATE SODIUM 100 MG PO CAPS
200.0000 mg | ORAL_CAPSULE | Freq: Every day | ORAL | Status: DC
  Administered 2014-11-04: 200 mg via ORAL
  Filled 2014-11-03: qty 2

## 2014-11-03 MED ORDER — ROCURONIUM BROMIDE 50 MG/5ML IV SOLN
INTRAVENOUS | Status: AC
  Filled 2014-11-03: qty 2

## 2014-11-03 MED ORDER — ACETAMINOPHEN 500 MG PO TABS
1000.0000 mg | ORAL_TABLET | Freq: Four times a day (QID) | ORAL | Status: DC
  Administered 2014-11-04 (×3): 1000 mg via ORAL
  Filled 2014-11-03 (×6): qty 2

## 2014-11-03 MED ORDER — FENTANYL CITRATE (PF) 100 MCG/2ML IJ SOLN
INTRAMUSCULAR | Status: DC | PRN
  Administered 2014-11-03: 450 ug via INTRAVENOUS
  Administered 2014-11-03: 50 ug via INTRAVENOUS
  Administered 2014-11-03 (×3): 250 ug via INTRAVENOUS

## 2014-11-03 MED ORDER — TAMSULOSIN HCL 0.4 MG PO CAPS
0.4000 mg | ORAL_CAPSULE | Freq: Every day | ORAL | Status: DC
  Administered 2014-11-04 – 2014-11-10 (×7): 0.4 mg via ORAL
  Filled 2014-11-03 (×7): qty 1

## 2014-11-03 MED ORDER — ASPIRIN 81 MG PO CHEW
324.0000 mg | CHEWABLE_TABLET | Freq: Every day | ORAL | Status: DC
  Filled 2014-11-03: qty 4

## 2014-11-03 MED ORDER — MAGNESIUM SULFATE 4 GM/100ML IV SOLN
4.0000 g | Freq: Once | INTRAVENOUS | Status: AC
  Administered 2014-11-03: 4 g via INTRAVENOUS
  Filled 2014-11-03: qty 100

## 2014-11-03 MED ORDER — SODIUM CHLORIDE 0.45 % IV SOLN: INTRAVENOUS | Status: DC | PRN

## 2014-11-03 MED ORDER — LACTATED RINGERS IV SOLN
INTRAVENOUS | Status: DC
  Administered 2014-11-03: 13:00:00 via INTRAVENOUS

## 2014-11-03 MED ORDER — CETYLPYRIDINIUM CHLORIDE 0.05 % MT LIQD
7.0000 mL | Freq: Four times a day (QID) | OROMUCOSAL | Status: DC
  Administered 2014-11-04 (×3): 7 mL via OROMUCOSAL

## 2014-11-03 MED ORDER — HEPARIN SODIUM (PORCINE) 1000 UNIT/ML IJ SOLN
INTRAMUSCULAR | Status: AC
  Filled 2014-11-03: qty 1

## 2014-11-03 MED ORDER — 0.9 % SODIUM CHLORIDE (POUR BTL) OPTIME
TOPICAL | Status: DC | PRN
  Administered 2014-11-03: 1000 mL

## 2014-11-03 MED ORDER — SODIUM CHLORIDE 0.9 % IV SOLN
INTRAVENOUS | Status: DC
  Administered 2014-11-03: 13:00:00 via INTRAVENOUS

## 2014-11-03 MED ORDER — BISACODYL 10 MG RE SUPP: 10.0000 mg | Freq: Every day | RECTAL | Status: DC

## 2014-11-03 MED ORDER — SODIUM CHLORIDE 0.9 % IV SOLN: 250.0000 mL | INTRAVENOUS | Status: DC

## 2014-11-03 MED ORDER — ACETAMINOPHEN 650 MG RE SUPP
650.0000 mg | Freq: Once | RECTAL | Status: AC
Start: 2014-11-03 — End: 2014-11-03
  Administered 2014-11-03: 650 mg via RECTAL

## 2014-11-03 MED ORDER — THROMBIN 20000 UNITS EX SOLR
CUTANEOUS | Status: AC
  Filled 2014-11-03: qty 20000

## 2014-11-03 MED ORDER — SODIUM BICARBONATE 4.2 % IV SOLN
INTRAVENOUS | Status: DC | PRN
  Administered 2014-11-03: 25 mL via INTRAVENOUS

## 2014-11-03 MED ORDER — METOPROLOL TARTRATE 12.5 MG HALF TABLET
12.5000 mg | ORAL_TABLET | Freq: Two times a day (BID) | ORAL | Status: DC
  Filled 2014-11-03 (×3): qty 1

## 2014-11-03 MED ORDER — ALBUMIN HUMAN 5 % IV SOLN
250.0000 mL | INTRAVENOUS | Status: DC | PRN
  Administered 2014-11-03: 250 mL via INTRAVENOUS
  Filled 2014-11-03: qty 250

## 2014-11-03 MED ORDER — DEXMEDETOMIDINE HCL IN NACL 200 MCG/50ML IV SOLN
0.0000 ug/kg/h | INTRAVENOUS | Status: DC
  Administered 2014-11-03: 0.3 ug/kg/h via INTRAVENOUS
  Filled 2014-11-03: qty 50

## 2014-11-03 MED ORDER — BISACODYL 5 MG PO TBEC
10.0000 mg | DELAYED_RELEASE_TABLET | Freq: Every day | ORAL | Status: DC
  Administered 2014-11-04: 10 mg via ORAL
  Filled 2014-11-03: qty 2

## 2014-11-03 MED ORDER — MIDAZOLAM HCL 5 MG/5ML IJ SOLN
INTRAMUSCULAR | Status: DC | PRN
  Administered 2014-11-03: 2 mg via INTRAVENOUS
  Administered 2014-11-03: 1 mg via INTRAVENOUS
  Administered 2014-11-03: 2 mg via INTRAVENOUS
  Administered 2014-11-03: 3 mg via INTRAVENOUS
  Administered 2014-11-03 (×2): 1 mg via INTRAVENOUS

## 2014-11-03 MED ORDER — ONDANSETRON HCL 4 MG/2ML IJ SOLN: 4.0000 mg | Freq: Four times a day (QID) | INTRAMUSCULAR | Status: DC | PRN

## 2014-11-03 MED ORDER — PROPOFOL 10 MG/ML IV BOLUS
INTRAVENOUS | Status: AC
  Filled 2014-11-03: qty 20

## 2014-11-03 MED ORDER — MORPHINE SULFATE 2 MG/ML IJ SOLN
2.0000 mg | INTRAMUSCULAR | Status: DC | PRN
  Administered 2014-11-03 – 2014-11-04 (×4): 4 mg via INTRAVENOUS
  Filled 2014-11-03: qty 2
  Filled 2014-11-03: qty 1
  Filled 2014-11-03 (×2): qty 2
  Filled 2014-11-03: qty 1
  Filled 2014-11-03: qty 2

## 2014-11-03 MED ORDER — METOPROLOL TARTRATE 1 MG/ML IV SOLN: 2.5000 mg | INTRAVENOUS | Status: DC | PRN

## 2014-11-03 MED ORDER — TRAMADOL HCL 50 MG PO TABS: 50.0000 mg | ORAL_TABLET | ORAL | Status: DC | PRN

## 2014-11-03 MED ORDER — PROPOFOL 10 MG/ML IV BOLUS
INTRAVENOUS | Status: DC | PRN
  Administered 2014-11-03: 40 mg via INTRAVENOUS
  Administered 2014-11-03 (×2): 20 mg via INTRAVENOUS
  Administered 2014-11-03 (×2): 40 mg via INTRAVENOUS

## 2014-11-03 MED ORDER — SODIUM CHLORIDE 0.9 % IV SOLN
INTRAVENOUS | Status: DC
  Administered 2014-11-03: 2.7 [IU]/h via INTRAVENOUS
  Filled 2014-11-03 (×2): qty 2.5

## 2014-11-03 MED ORDER — SODIUM CHLORIDE 0.9 % IJ SOLN
3.0000 mL | Freq: Two times a day (BID) | INTRAMUSCULAR | Status: DC
  Administered 2014-11-04: 3 mL via INTRAVENOUS

## 2014-11-03 MED ORDER — PROTAMINE SULFATE 10 MG/ML IV SOLN
INTRAVENOUS | Status: DC | PRN
Start: 2014-11-03 — End: 2014-11-03
  Administered 2014-11-03: 280 mg via INTRAVENOUS

## 2014-11-03 MED ORDER — ALLOPURINOL 100 MG PO TABS
100.0000 mg | ORAL_TABLET | Freq: Every day | ORAL | Status: DC
  Administered 2014-11-04 – 2014-11-10 (×7): 100 mg via ORAL
  Filled 2014-11-03 (×7): qty 1

## 2014-11-03 MED ORDER — LACTATED RINGERS IV SOLN
INTRAVENOUS | Status: DC | PRN
Start: 2014-11-03 — End: 2014-11-03
  Administered 2014-11-03: 08:00:00 via INTRAVENOUS

## 2014-11-03 MED ORDER — INSULIN REGULAR BOLUS VIA INFUSION
0.0000 [IU] | Freq: Three times a day (TID) | INTRAVENOUS | Status: DC
  Filled 2014-11-03: qty 10

## 2014-11-03 MED ORDER — OXYCODONE HCL 5 MG PO TABS
5.0000 mg | ORAL_TABLET | ORAL | Status: DC | PRN
  Administered 2014-11-03 – 2014-11-04 (×4): 10 mg via ORAL
  Filled 2014-11-03 (×4): qty 2

## 2014-11-03 MED ORDER — HEPARIN SODIUM (PORCINE) 1000 UNIT/ML IJ SOLN
INTRAMUSCULAR | Status: DC | PRN
  Administered 2014-11-03: 29000 [IU] via INTRAVENOUS

## 2014-11-03 MED ORDER — FAMOTIDINE IN NACL 20-0.9 MG/50ML-% IV SOLN
20.0000 mg | Freq: Two times a day (BID) | INTRAVENOUS | Status: DC
  Administered 2014-11-03: 20 mg via INTRAVENOUS

## 2014-11-03 MED ORDER — PANTOPRAZOLE SODIUM 40 MG PO TBEC: 40.0000 mg | DELAYED_RELEASE_TABLET | Freq: Every day | ORAL | Status: DC

## 2014-11-03 MED ORDER — THROMBIN 20000 UNITS EX SOLR
OROMUCOSAL | Status: DC | PRN
  Administered 2014-11-03: 09:00:00 via TOPICAL

## 2014-11-03 MED ORDER — CHLORHEXIDINE GLUCONATE 4 % EX LIQD: 30.0000 mL | CUTANEOUS | Status: DC

## 2014-11-03 MED ORDER — SODIUM CHLORIDE 0.9 % IJ SOLN: 3.0000 mL | INTRAMUSCULAR | Status: DC | PRN

## 2014-11-03 MED ORDER — CHLORHEXIDINE GLUCONATE 0.12 % MT SOLN
15.0000 mL | Freq: Two times a day (BID) | OROMUCOSAL | Status: DC
  Administered 2014-11-03 – 2014-11-04 (×2): 15 mL via OROMUCOSAL
  Filled 2014-11-03 (×4): qty 15

## 2014-11-03 MED ORDER — VECURONIUM BROMIDE 10 MG IV SOLR
INTRAVENOUS | Status: DC | PRN
  Administered 2014-11-03: 5 mg via INTRAVENOUS

## 2014-11-03 MED ORDER — LIDOCAINE HCL (CARDIAC) 20 MG/ML IV SOLN
INTRAVENOUS | Status: AC
  Filled 2014-11-03: qty 5

## 2014-11-03 MED ORDER — ACETAMINOPHEN 160 MG/5ML PO SOLN: 650.0000 mg | Freq: Once | ORAL | Status: AC

## 2014-11-03 MED FILL — Electrolyte-R (PH 7.4) Solution: INTRAVENOUS | Qty: 3000 | Status: AC

## 2014-11-03 MED FILL — Heparin Sodium (Porcine) Inj 1000 Unit/ML: INTRAMUSCULAR | Qty: 10 | Status: AC

## 2014-11-03 MED FILL — Lidocaine HCl IV Inj 20 MG/ML: INTRAVENOUS | Qty: 5 | Status: AC

## 2014-11-03 MED FILL — Mannitol IV Soln 20%: INTRAVENOUS | Qty: 500 | Status: AC

## 2014-11-03 MED FILL — Sodium Chloride IV Soln 0.9%: INTRAVENOUS | Qty: 2000 | Status: AC

## 2014-11-03 MED FILL — Sodium Bicarbonate IV Soln 8.4%: INTRAVENOUS | Qty: 150 | Status: AC

## 2014-11-03 SURGICAL SUPPLY — 102 items
BAG DECANTER FOR FLEXI CONT (MISCELLANEOUS) ×3 IMPLANT
BANDAGE ELASTIC 4 VELCRO ST LF (GAUZE/BANDAGES/DRESSINGS) ×3 IMPLANT
BANDAGE ELASTIC 6 VELCRO ST LF (GAUZE/BANDAGES/DRESSINGS) ×3 IMPLANT
BASKET HEART (ORDER IN 25'S) (MISCELLANEOUS) ×1
BASKET HEART (ORDER IN 25S) (MISCELLANEOUS) ×2 IMPLANT
BLADE STERNUM SYSTEM 6 (BLADE) ×3 IMPLANT
BLADE SURG 11 STRL SS (BLADE) ×3 IMPLANT
BNDG GAUZE ELAST 4 BULKY (GAUZE/BANDAGES/DRESSINGS) ×3 IMPLANT
CANISTER SUCTION 2500CC (MISCELLANEOUS) ×3 IMPLANT
CATH ROBINSON RED A/P 18FR (CATHETERS) ×6 IMPLANT
CATH THORACIC 28FR (CATHETERS) ×3 IMPLANT
CATH THORACIC 36FR (CATHETERS) ×3 IMPLANT
CATH THORACIC 36FR RT ANG (CATHETERS) ×3 IMPLANT
CLIP FOGARTY SPRING 6M (CLIP) ×3 IMPLANT
CLIP TI MEDIUM 24 (CLIP) IMPLANT
CLIP TI WIDE RED SMALL 24 (CLIP) ×3 IMPLANT
COVER SURGICAL LIGHT HANDLE (MISCELLANEOUS) ×3 IMPLANT
CRADLE DONUT ADULT HEAD (MISCELLANEOUS) ×3 IMPLANT
DERMABOND ADHESIVE PROPEN (GAUZE/BANDAGES/DRESSINGS) ×1
DERMABOND ADVANCED .7 DNX6 (GAUZE/BANDAGES/DRESSINGS) ×2 IMPLANT
DRAPE CARDIOVASCULAR INCISE (DRAPES) ×1
DRAPE SLUSH/WARMER DISC (DRAPES) ×3 IMPLANT
DRAPE SRG 135X102X78XABS (DRAPES) ×2 IMPLANT
DRSG COVADERM 4X14 (GAUZE/BANDAGES/DRESSINGS) ×3 IMPLANT
ELECT CAUTERY BLADE 6.4 (BLADE) ×3 IMPLANT
ELECT REM PT RETURN 9FT ADLT (ELECTROSURGICAL) ×6
ELECTRODE REM PT RTRN 9FT ADLT (ELECTROSURGICAL) ×4 IMPLANT
GAUZE SPONGE 4X4 12PLY STRL (GAUZE/BANDAGES/DRESSINGS) ×6 IMPLANT
GLOVE BIO SURGEON STRL SZ 6 (GLOVE) ×9 IMPLANT
GLOVE BIO SURGEON STRL SZ 6.5 (GLOVE) ×9 IMPLANT
GLOVE BIO SURGEON STRL SZ7 (GLOVE) IMPLANT
GLOVE BIO SURGEON STRL SZ7.5 (GLOVE) IMPLANT
GLOVE BIOGEL PI IND STRL 6 (GLOVE) ×8 IMPLANT
GLOVE BIOGEL PI IND STRL 6.5 (GLOVE) IMPLANT
GLOVE BIOGEL PI IND STRL 7.0 (GLOVE) IMPLANT
GLOVE BIOGEL PI INDICATOR 6 (GLOVE) ×4
GLOVE BIOGEL PI INDICATOR 6.5 (GLOVE)
GLOVE BIOGEL PI INDICATOR 7.0 (GLOVE)
GLOVE EUDERMIC 7 POWDERFREE (GLOVE) ×6 IMPLANT
GLOVE ORTHO TXT STRL SZ7.5 (GLOVE) IMPLANT
GOWN STRL REUS W/ TWL LRG LVL3 (GOWN DISPOSABLE) ×12 IMPLANT
GOWN STRL REUS W/ TWL XL LVL3 (GOWN DISPOSABLE) ×2 IMPLANT
GOWN STRL REUS W/TWL LRG LVL3 (GOWN DISPOSABLE) ×6
GOWN STRL REUS W/TWL XL LVL3 (GOWN DISPOSABLE) ×1
HEMOSTAT POWDER SURGIFOAM 1G (HEMOSTASIS) ×9 IMPLANT
HEMOSTAT SURGICEL 2X14 (HEMOSTASIS) ×3 IMPLANT
INSERT FOGARTY 61MM (MISCELLANEOUS) IMPLANT
INSERT FOGARTY XLG (MISCELLANEOUS) IMPLANT
KIT BASIN OR (CUSTOM PROCEDURE TRAY) ×3 IMPLANT
KIT CATH CPB BARTLE (MISCELLANEOUS) ×3 IMPLANT
KIT ROOM TURNOVER OR (KITS) ×3 IMPLANT
KIT SUCTION CATH 14FR (SUCTIONS) ×3 IMPLANT
KIT VASOVIEW W/TROCAR VH 2000 (KITS) ×3 IMPLANT
NS IRRIG 1000ML POUR BTL (IV SOLUTION) ×15 IMPLANT
PACK OPEN HEART (CUSTOM PROCEDURE TRAY) ×3 IMPLANT
PACK TRANSFER 300ML (TOM) (MISCELLANEOUS) ×3 IMPLANT
PAD ARMBOARD 7.5X6 YLW CONV (MISCELLANEOUS) ×6 IMPLANT
PAD ELECT DEFIB RADIOL ZOLL (MISCELLANEOUS) ×3 IMPLANT
PENCIL BUTTON HOLSTER BLD 10FT (ELECTRODE) ×3 IMPLANT
PUNCH AORTIC ROTATE 4.0MM (MISCELLANEOUS) IMPLANT
PUNCH AORTIC ROTATE 4.5MM 8IN (MISCELLANEOUS) ×3 IMPLANT
PUNCH AORTIC ROTATE 5MM 8IN (MISCELLANEOUS) IMPLANT
SET CARDIOPLEGIA MPS 5001102 (MISCELLANEOUS) ×3 IMPLANT
SET Y EXTENSION LINE CSP (IV SETS) ×3 IMPLANT
SPONGE GAUZE 4X4 12PLY STER LF (GAUZE/BANDAGES/DRESSINGS) ×6 IMPLANT
SPONGE INTESTINAL PEANUT (DISPOSABLE) IMPLANT
SPONGE LAP 18X18 X RAY DECT (DISPOSABLE) IMPLANT
SPONGE LAP 4X18 X RAY DECT (DISPOSABLE) ×3 IMPLANT
SUT BONE WAX W31G (SUTURE) ×3 IMPLANT
SUT MNCRL AB 4-0 PS2 18 (SUTURE) ×6 IMPLANT
SUT PROLENE 3 0 SH DA (SUTURE) IMPLANT
SUT PROLENE 3 0 SH1 36 (SUTURE) ×3 IMPLANT
SUT PROLENE 4 0 RB 1 (SUTURE)
SUT PROLENE 4 0 SH DA (SUTURE) IMPLANT
SUT PROLENE 4-0 RB1 .5 CRCL 36 (SUTURE) IMPLANT
SUT PROLENE 5 0 C 1 36 (SUTURE) IMPLANT
SUT PROLENE 6 0 C 1 30 (SUTURE) IMPLANT
SUT PROLENE 7 0 BV 1 (SUTURE) IMPLANT
SUT PROLENE 7 0 BV1 MDA (SUTURE) ×6 IMPLANT
SUT PROLENE 8 0 BV175 6 (SUTURE) IMPLANT
SUT SILK  1 MH (SUTURE) ×4
SUT SILK 1 MH (SUTURE) ×8 IMPLANT
SUT STEEL STERNAL CCS#1 18IN (SUTURE) IMPLANT
SUT STEEL SZ 6 DBL 3X14 BALL (SUTURE) ×9 IMPLANT
SUT VIC AB 1 CTX 36 (SUTURE) ×3
SUT VIC AB 1 CTX36XBRD ANBCTR (SUTURE) ×6 IMPLANT
SUT VIC AB 2-0 CT1 27 (SUTURE)
SUT VIC AB 2-0 CT1 TAPERPNT 27 (SUTURE) IMPLANT
SUT VIC AB 2-0 CTX 27 (SUTURE) IMPLANT
SUT VIC AB 3-0 SH 27 (SUTURE) ×2
SUT VIC AB 3-0 SH 27X BRD (SUTURE) ×4 IMPLANT
SUT VIC AB 3-0 X1 27 (SUTURE) IMPLANT
SUT VICRYL 4-0 PS2 18IN ABS (SUTURE) IMPLANT
SUTURE E-PAK OPEN HEART (SUTURE) ×3 IMPLANT
SYSTEM SAHARA CHEST DRAIN ATS (WOUND CARE) ×3 IMPLANT
TAPE CLOTH SURG 4X10 WHT LF (GAUZE/BANDAGES/DRESSINGS) ×3 IMPLANT
TOWEL OR 17X24 6PK STRL BLUE (TOWEL DISPOSABLE) ×3 IMPLANT
TOWEL OR 17X26 10 PK STRL BLUE (TOWEL DISPOSABLE) ×3 IMPLANT
TRAY FOLEY IC TEMP SENS 16FR (CATHETERS) ×3 IMPLANT
TUBING INSUFFLATION (TUBING) ×3 IMPLANT
UNDERPAD 30X30 INCONTINENT (UNDERPADS AND DIAPERS) ×3 IMPLANT
WATER STERILE IRR 1000ML POUR (IV SOLUTION) ×6 IMPLANT

## 2014-11-03 NOTE — Progress Notes (Signed)
  Echocardiogram Echocardiogram Transesophageal has been performed.  Donata Clay 11/03/2014, 8:41 AM

## 2014-11-03 NOTE — Anesthesia Postprocedure Evaluation (Signed)
  Anesthesia Post-op Note  Patient: Mario [name]  Procedure(s) Performed: Procedure(s): CORONARY ARTERY BYPASS GRAFTING  times three using left internal mammary and right greater saphenous vein (N/A) TRANSESOPHAGEAL ECHOCARDIOGRAM (TEE) (N/A)  Patient Location: ICU  Anesthesia Type:General  Level of Consciousness: Patient remains intubated per anesthesia plan  Airway and Oxygen Therapy: Patient remains intubated per anesthesia plan  Post-op Pain: none  Post-op Assessment: Post-op Vital signs reviewed              Post-op Vital Signs: Reviewed  Last Vitals:  Filed Vitals:   11/03/14 1545  BP:   Pulse: 80  Temp: 36.4 C  Resp: 19    Complications: No apparent anesthesia complications

## 2014-11-03 NOTE — Progress Notes (Signed)
Pt. Unable to get urine specimen,stated he just went to bathroom.

## 2014-11-03 NOTE — Interval H&P Note (Signed)
History and Physical Interval Note:  11/03/2014 7:06 AM  Mario [name]  has presented today for surgery, with the diagnosis of CAD  The various methods of treatment have been discussed with the patient and family. After consideration of risks, benefits and other options for treatment, the patient has consented to  Procedure(s): CORONARY ARTERY BYPASS GRAFTING (CABG) (N/A) TRANSESOPHAGEAL ECHOCARDIOGRAM (TEE) (N/A) as a surgical intervention .  The patient's history has been reviewed, patient examined, no change in status, stable for surgery.  I have reviewed the patient's chart and labs.  Questions were answered to the patient's satisfaction.     Gaye Pollack

## 2014-11-03 NOTE — Anesthesia Procedure Notes (Signed)
Procedure Name: Intubation Date/Time: 11/03/2014 7:50 AM Performed by: Gaylene Brooks Pre-anesthesia Checklist: Emergency Drugs available, Patient identified, Timeout performed, Suction available and Patient being monitored Patient Re-evaluated:Patient Re-evaluated prior to inductionOxygen Delivery Method: Circle system utilized Preoxygenation: Pre-oxygenation with 100% oxygen Intubation Type: IV induction Ventilation: Mask ventilation without difficulty and Oral airway inserted - appropriate to patient size Laryngoscope Size: Sabra Heck and 2 Grade View: Grade II Tube type: Oral Tube size: 8.0 mm Number of attempts: 1 Airway Equipment and Method: Stylet Placement Confirmation: ETT inserted through vocal cords under direct vision,  breath sounds checked- equal and bilateral,  positive ETCO2 and CO2 detector Secured at: 22 cm Tube secured with: Tape Dental Injury: Teeth and Oropharynx as per pre-operative assessment

## 2014-11-03 NOTE — Procedures (Signed)
Extubation Procedure Note  Patient Details:   Name: Mario [name] DOB: [birth year] MRN: [id number]   Airway Documentation:     Evaluation  O2 sats: stable throughout Complications: No apparent complications Patient did tolerate procedure well. Bilateral Breath Sounds: Clear, Diminished   Yes  Patient tolerated rapid wean. NIF -20 and VC 1.1 L. Positive for cuff leak. Patient extubated to a 3 Lpm nasal cannula. No signs of dyspnea or stridor. Patient instructed on the Incentive Spirometer, achieving 600 mL three times. RN at bedside.   Myrtie Neither 11/03/2014, 5:27 PM

## 2014-11-03 NOTE — Anesthesia Preprocedure Evaluation (Addendum)
Anesthesia Evaluation  Patient identified by MRN, date of birth, ID band Patient awake    Reviewed: Allergy & Precautions, NPO status , Patient's Chart, lab work & pertinent test results  Airway Mallampati: I  TM Distance: >3 FB Neck ROM: Full    Dental  (+) Teeth Intact, Dental Advisory Given   Pulmonary COPDCurrent Smoker,  breath sounds clear to auscultation        Cardiovascular hypertension, + CAD, + Peripheral Vascular Disease and +CHF Rhythm:Regular Rate:Normal     Neuro/Psych negative neurological ROS     GI/Hepatic negative GI ROS, Neg liver ROS,   Endo/Other  diabetes  Renal/GU CRFRenal disease     Musculoskeletal  (+) Arthritis -,   Abdominal   Peds  Hematology  (+) anemia , Thrombocytopenia   Anesthesia Other Findings   Reproductive/Obstetrics                           Lab Results  Component Value Date   WBC 6.0 10/30/2014   HGB 11.8* 10/30/2014   HCT 34.9* 10/30/2014   MCV 87.3 10/30/2014   PLT 131* 10/30/2014   Lab Results  Component Value Date   CREATININE 1.09 10/30/2014   BUN 25* 10/30/2014   NA 135 10/30/2014   K 4.4 10/30/2014   CL 110 10/30/2014   CO2 16* 10/30/2014    Anesthesia Physical Anesthesia Plan  ASA: IV  Anesthesia Plan: General   Post-op Pain Management:    Induction: Intravenous  Airway Management Planned: Oral ETT  Additional Equipment: Arterial line, CVP, PA Cath, TEE and Ultrasound Guidance Line Placement  Intra-op Plan:   Post-operative Plan: Post-operative intubation/ventilation  Informed Consent: I have reviewed the patients History and Physical, chart, labs and discussed the procedure including the risks, benefits and alternatives for the proposed anesthesia with the patient or authorized representative who has indicated his/her understanding and acceptance.   Dental advisory given  Plan Discussed with: CRNA  Anesthesia  Plan Comments:         Anesthesia Quick Evaluation

## 2014-11-03 NOTE — Progress Notes (Signed)
CT surgery p.m. Rounds  Patient extubated and alert, comfortable Hemodynamic stable Lungs clear Extremities warm Minimal chest tube drainage 6 hour postop labs reviewed and are satisfactory

## 2014-11-03 NOTE — Op Note (Signed)
CARDIOVASCULAR SURGERY OPERATIVE NOTE  11/03/2014  Surgeon:  Gaye Pollack, MD  First Assistant: Lars Pinks,  PA-C   Preoperative Diagnosis:  Severe multi-vessel coronary artery disease   Postoperative Diagnosis:  Same   Procedure:  1. Median Sternotomy 2. Extracorporeal circulation 3.   Coronary artery bypass grafting x 3   Free left internal mammary graft to the LAD  SVG to OM  SVG to distal RCA  4.   Endoscopic vein harvest from the right leg   Anesthesia:  General Endotracheal   Clinical History/Surgical Indication:  The patient is a 74 year old gentleman with hypertension, hyperlipidemia, type 2 DM, stage 2 CKD, and known coronary artery disease s/p IMI and BMS in 1997. He now has new onset dyspnea on exertion that he noticed when lifting stacks of newspapers that he was helping his son deliver. He has had no symptoms at rest and no chest pain. He had a high risk nuclear stress test showing a large scar in the RCA territory with minimal periinfarct ischemia in the mid inferoseptal and apical inferior walls with an LVEF of 34%. There was akinesis in the basal and mid inferolateral and inferior walls and in the basal inferoseptal wall. Cardiac cath on 10/02/2014 showed severe calcification of the coronary arteries, mitral annulus, aortic arch and great vessels. This prevented performing the procedure from the right radial approach due to the heavy calcification in the subclavian and innominate arteries preventing advancement of the catheter in to the aorta. There was a severe 90% mid RCA stenosis, 80% mid LAD and 70% diagonal stenosis, and 85% proximal LCX and OM1 stenosis. LVEF was 40%.  He has severe multi-vessel coronary disease with moderate LV dysfunction and new onset of exertional dyspnea. I agree that CABG is indicated to prevent further ischemia and infarction and to  preserve myocardium, improve symptoms. He has severe arterial calcification involving the aortic arch and great vessels seen on fluoroscopy. CT scan shows extensive calcification of the aortic arch, brachiocephalic and subclavian arteries with significant stenosis of both proximal subclavian arteries. The ascending aorta has no significant calcification. I will plan to do a free LIMA graft due to the significant proximal left subclavian calcification and stenoisis. I reviewed his cath findings with him and his family and answered their questions. I discussed the operative procedure with the patient and family including alternatives, benefits and risks; including but not limited to bleeding, blood transfusion, infection, stroke, myocardial infarction, graft failure, heart block requiring a permanent pacemaker, organ dysfunction, and death. Janace Litten understands and agrees to proceed.  Preparation:  The patient was seen in the preoperative holding area and the correct patient, correct operation were confirmed with the patient after reviewing the medical record and catheterization. The consent was signed by me. Preoperative antibiotics were given. A pulmonary arterial line and radial arterial line were placed by the anesthesia team. The patient was taken back to the operating room and positioned supine on the operating room table. After being placed under general endotracheal anesthesia by the anesthesia team a foley catheter was placed. The neck, chest, abdomen, and both legs were prepped with betadine soap and solution and draped in the usual sterile manner. A surgical time-out was taken and the correct patient and operative procedure were confirmed with the nursing and anesthesia staff.   Cardiopulmonary Bypass:  A median sternotomy was performed. The pericardium was opened in the midline. Right ventricular function appeared normal. The ascending aorta was of normal size and had  no palpable plaque.  There were no contraindications to aortic cannulation or cross-clamping. The patient was fully systemically heparinized and the ACT was maintained > 400 sec. The proximal aortic arch was cannulated with a 20 F aortic cannula for arterial inflow. Venous cannulation was performed via the right atrial appendage using a two-staged venous cannula. An antegrade cardioplegia/vent cannula was inserted into the mid-ascending aorta. Aortic occlusion was performed with a single cross-clamp. Systemic cooling to 32 degrees Centigrade and topical cooling of the heart with iced saline were used. Hyperkalemic antegrade cold blood cardioplegia was used to induce diastolic arrest and was then given at about 20 minute intervals throughout the period of arrest to maintain myocardial temperature at or below 10 degrees centigrade. A temperature probe was inserted into the interventricular septum and an insulating pad was placed in the pericardium.   Left internal mammary harvest:  The left side of the sternum was retracted using the Rultract retractor. The left internal mammary artery was harvested as a pedicle graft. All side branches were clipped. It was a medium-sized vessel of good quality with excellent blood flow. It was ligated distally and divided. It was sprayed with topical papaverine solution to prevent vasospasm.   Endoscopic vein harvest:  The left greater saphenous vein was evaluated through a 2 cm incision medial to the left knee. It was small and therefore the right greater saphenous vein was used. The right greater saphenous vein was harvested endoscopically through a 2 cm incision medial to the right knee. It was harvested from the upper thigh to below the knee. It was a medium-sized vein of good quality. The side branches were all ligated with 4-0 silk ties.    Coronary arteries:  The coronary arteries were examined. They were diffusely disease with continuous calcified plaque involving the proximal and  mid portions of all arteries.   LAD:  Large vessel with calcified plaque extending to the distal third of the vessel. Beyond this the vessel had only mid disease and was still large enough to graft distally. The diagonal was small and diffusely diseased and not graftable.  LCX:  The large OM was also diffusely diseased with calcific plaque. The vessel was graftable distally.  RCA:  Large dominant vessel that was diffusely diseased. There was one area beyond the takeoff of the PDA that was soft enough to graft. The PDA and PL branches were long but thin.   Grafts:  1. Free LIMA to the LAD: 1.75 mm. It was sewn end to side using 8-0 prolene continuous suture. 2. SVG to OM:  1.75 mm. It was sewn end to side using 7-0 prolene continuous suture. 3. SVG to distal RCA:  2.5 mm. It was sewn end to side using 7-0 prolene continuous suture.   The proximal vein graft anastomoses were performed to the mid-ascending aorta using continuous 6-0 prolene suture. The proximal anastomosis of the LIMA graft was performed to the hood of the OM vein graft using continuous 7-0 prolene suture. Graft markers were placed around the proximal anastomoses.   Completion:  The patient was rewarmed to 37 degrees Centigrade. The crossclamp was removed with a time of 76 minutes. There was spontaneous return of sinus rhythm. The distal and proximal anastomoses were checked for hemostasis. The position of the grafts was satisfactory. Two temporary epicardial pacing wires were placed on the right atrium and two on the right ventricle. The patient was weaned from CPB without difficulty on no inotropes. CPB time was 94 minutes.  Cardiac output was 8 LPM. Heparin was fully reversed with protamine and the aortic and venous cannulas removed. Platelet count was 88K and he was given one unit of platelets. Hemostasis was achieved. Mediastinal and bialteral pleural drainage tubes were placed. The sternum was closed with double #6 stainless  steel wires. The fascia was closed with continuous # 1 vicryl suture. The subcutaneous tissue was closed with 2-0 vicryl continuous suture. The skin was closed with 3-0 vicryl subcuticular suture. All sponge, needle, and instrument counts were reported correct at the end of the case. Dry sterile dressings were placed over the incisions and around the chest tubes which were connected to pleurevac suction. The patient was then transported to the surgical intensive care unit in critical but stable condition.

## 2014-11-03 NOTE — Brief Op Note (Signed)
11/03/2014  10:47 AM  PATIENT:  Mario [name]  74 y.o. male  PRE-OPERATIVE DIAGNOSIS:  Coronary Artery Disease  POST-OPERATIVE DIAGNOSIS:  Coronary Artery Disease  PROCEDURE:  TRANSESOPHAGEAL ECHOCARDIOGRAM (TEE) CORONARY ARTERY BYPASS GRAFTING x 3 (LIMA to LAD, SVG to OM, SVG to DISTAL RCA)  using left internal mammary and right greater saphenous thigh vein   SURGEON:  Surgeon(s) and Role:    * Gaye Pollack, MD - Primary  PHYSICIAN ASSISTANT: Lars Pinks PA-C  ANESTHESIA:   general  EBL:  Total I/O In: 1000 [I.V.:1000] Out: 175 [Urine:175]   DRAINS: Chest tubes placed in the mediastinal and plerual spaces   COUNTS CORRECT:  YES  DICTATION: .Dragon Dictation  PLAN OF CARE: Admit to inpatient   PATIENT DISPOSITION:  ICU - intubated and hemodynamically stable.   Delay start of Pharmacological VTE agent (>24hrs) due to surgical blood loss or risk of bleeding: yes  BASELINE WEIGHT: 75.8 kg

## 2014-11-03 NOTE — H&P (Signed)
[address]       [place] [address]             [phone]         Cardiothoracic Surgery History and Physical   PCP is Kandice Hams, MD Referring Provider is Belva Crome, MD  Chief Complaint  Patient presents with  . Coronary Artery Disease    cathed 10/02/14    HPI:  The patient is a 74 year old gentleman with hypertension, hyperlipidemia, type 2 DM, stage 2 CKD, and known coronary artery disease s/p IMI and BMS in 1997. He now has new onset dyspnea on exertion that he noticed when lifting stacks of newspapers that he was helping his son deliver. He has had no symptoms at rest and no chest pain. He had a high risk nuclear stress test showing a large scar in the RCA territory with minimal periinfarct ischemia in the mid inferoseptal and apical inferior walls with an LVEF of 34%. There was akinesis in the basal and mid inferolateral and inferior walls and in the basal inferoseptal wall. Cardiac cath on 10/02/2014 showed severe calcification of the coronary arteries, mitral annulus, aortic arch and great vessels. This prevented performing the procedure from the right radial approach due to the heavy calcification in the subclavian and innominate arteries preventing advancement of the catheter in to the aorta. There was a severe 90% mid RCA stenosis, 80% mid LAD and 70% diagonal stenosis, and 85% proximal LCX and OM1 stenosis. LVEF was 40%.  Past Medical History  Diagnosis Date  . Essential hypertension, benign   . Other and unspecified hyperlipidemia   . Chronic kidney disease, stage II (mild)   . Type II or unspecified type diabetes mellitus without mention of complication, uncontrolled   . PAD (peripheral artery disease)   . Orthostasis   . CAD (coronary artery disease)     with IMI and BMS 1997  . Gout     Past Surgical History  Procedure Laterality Date  . Hernia repair    .  Cholecystectomy    . Cataract extraction    . Melanoma excision    . Skin lesion excision    . Abdominal aortagram N/A 10/09/2013    Procedure: ABDOMINAL Maxcine Ham; Surgeon: Wellington Hampshire, MD; Location: [facility] CATH LAB; Service: Cardiovascular; Laterality: N/A;  . Cardiac catheterization N/A 10/02/2014    Procedure: Left Heart Cath and Coronary Angiography; Surgeon: Belva Crome, MD; Location: [facility] CV LAB; Service: Cardiovascular; Laterality: N/A;    Family History  Problem Relation Age of Onset  . Colon cancer Father   . Heart disease Mother     Social History History  Substance Use Topics  . Smoking status: Current Every Day Smoker -- 0.50 packs/day for 49 years    Types: Cigarettes  . Smokeless tobacco: Never Used  . Alcohol Use: No    Current Outpatient Prescriptions  Medication Sig Dispense Refill  . acetaminophen (TYLENOL) 500 MG tablet Take 1,000 mg by mouth daily.    Marland Kitchen allopurinol (ZYLOPRIM) 100 MG tablet TAKE 1 TABLET EVERY DAY 90 tablet 1  . aspirin EC 81 MG tablet Take 1 tablet (81 mg total) by mouth daily. 1 tablet 0  . atorvastatin (LIPITOR) 40 MG tablet TAKE 1 TABLET EVERY DAY 90 tablet 1  . Blood Glucose Calibration (ACCU-CHEK SMARTVIEW CONTROL) LIQD Use as directed 1 each 1  . Blood Glucose Monitoring Suppl (ACCU-CHEK NANO SMARTVIEW) W/DEVICE KIT Use  to check blood sugars 2 times per day dx code 250.02 1 kit 1  . carvedilol (COREG) 3.125 MG tablet Take 1 tablet (3.125 mg total) by mouth 2 (two) times daily. 60 tablet 5  . cholecalciferol (VITAMIN D) 1000 UNITS tablet Take 2,000 Units by mouth daily.    Marland Kitchen Co-Enzyme Q-10 100 MG CAPS Take 100 mg by mouth daily.    . CYCLOSET 0.8 MG TABS TAKE 6 TABLETS ONCE A DAY (Patient taking differently: TAKE 5 TABLETS ONCE A DAY) 540 tablet 1  . ferrous sulfate 325 (65 FE) MG tablet Take 325 mg by  mouth daily.     . fluorouracil (EFUDEX) 5 % cream Apply 1 application topically daily.    . folic acid (FOLVITE) 979 MCG tablet Take 800 mcg by mouth daily.    Marland Kitchen GLIPIZIDE XL 10 MG 24 hr tablet TAKE 1 TABLET BY MOUTH DAILY WITH BREAKFAST (Patient taking differently: TAKE 1 TABLET BY MOUTH DAILY AT BEDTIME) 90 tablet 1  . metFORMIN (GLUCOPHAGE) 1000 MG tablet Take 1 tablet (1,000 mg total) by mouth 2 (two) times daily with a meal. 180 tablet 3  . MICROLET LANCETS MISC USE ONE LANCET TO CHECK GLUCOSE TWICE DAILY AS DIRECTED DX CODE E11.65 100 each 3  . miglitol (GLYSET) 50 MG tablet Take 1 tablet (50 mg total) by mouth 3 (three) times daily with meals. (Patient taking differently: Take 50 mg by mouth 2 (two) times daily. ) 90 tablet 5  . Multiple Vitamins-Minerals (MEMORY VITE) TABS Take 1 tablet by mouth daily.    . Multiple Vitamins-Minerals (PRESERVISION AREDS 2) CAPS Take 2 capsules by mouth daily.    . nitroGLYCERIN (NITROSTAT) 0.4 MG SL tablet Place 1 tablet (0.4 mg total) under the tongue every 5 (five) minutes as needed. (Patient taking differently: Place 0.4 mg under the tongue every 5 (five) minutes as needed for chest pain. ) 25 tablet 3  . omega-3 acid ethyl esters (LOVAZA) 1 G capsule Take 1 g by mouth daily.    . sitaGLIPtin (JANUVIA) 100 MG tablet Take 1 tablet (100 mg total) by mouth daily. 30 tablet 5  . tamsulosin (FLOMAX) 0.4 MG CAPS capsule Take 1 capsule (0.4 mg total) by mouth daily. 30 capsule 0  . vitamin B-12 (CYANOCOBALAMIN) 500 MCG tablet Take 1,000 mcg by mouth daily.    Marland Kitchen glucose blood (ACCU-CHEK SMARTVIEW) test strip Use as instructed to check blood sugar 2 times per day dx code E11.65 200 each 1   No current facility-administered medications for this visit.    Allergies  Allergen Reactions  . Penicillins Anaphylaxis  . Plavix [Clopidogrel Bisulfate] Hives  . Pletal  [Cilostazol] Itching    Review of Systems  Constitutional: Negative.  HENT: Negative.  Eyes: Negative.  Respiratory: Negative.  Cardiovascular: Negative for chest pain, palpitations and leg swelling.   Exertional dyspnea  Gastrointestinal: Negative.  Endocrine: Negative.  Genitourinary: Negative.  Musculoskeletal: Negative.  Skin: Negative.  Allergic/Immunologic: Negative.  Neurological: Negative.  Hematological: Negative.  Psychiatric/Behavioral: Negative.    BP 168/87 mmHg  Pulse 70  Temp(Src) 97.4 F (36.3 C) (Oral)  Resp 18  Ht _0  (1.88 m)  Wt 75.836 kg (167 lb 3 oz)  BMI 21.46 kg/m2  SpO2 100%  Physical Exam  Constitutional: He is oriented to person, place, and time. He appears well-developed and well-nourished. No distress.  HENT:  Head: Normocephalic and atraumatic.  Mouth/Throat: Oropharynx is clear and moist.  Eyes: EOM are normal. Pupils are  equal, round, and reactive to light.  Neck: Normal range of motion. Neck supple. No JVD present. No thyromegaly present.  Cardiovascular: Normal rate, regular rhythm and normal heart sounds.  No murmur heard. Pulmonary/Chest: Effort normal and breath sounds normal. No respiratory distress. He has no wheezes.  Abdominal: Soft. Bowel sounds are normal. He exhibits no distension and no mass. There is no tenderness.  Musculoskeletal: Normal range of motion. He exhibits no edema.  Lymphadenopathy:   He has no cervical adenopathy.  Neurological: He is alert and oriented to person, place, and time. He has normal strength. No cranial nerve deficit or sensory deficit.  Skin: Skin is warm and dry.  Psychiatric: He has a normal mood and affect.     Diagnostic Tests:  Cardiac Cath 10/02/2014:   Severe coronary, mitral annular, aortic arch, subclavian, left common carotid, and right innominate artery calcification easily seen with fluoroscopy.  Unable to perform the procedure from the right  radial approach due to heavy calcification within subclavian and innominate artery preventing advancement of the diagnostic catheter into the ascending aorta.  Severe mid RCA 90% stenosis. Severe proximal circumflex and first obtuse marginal 85% stenosis. Moderately severe mid LAD/ diagonal 80/70% stenosis respectively. The distal right coronary fills late by collaterals from the left coronary system.  Left ventricular dysfunction with basal to mid inferior wall akinesis. EF 40%.  RECOMMENDATIONS:   TCTS evaluation for possible bypass grafting  Depending upon the surgical opinion, further management may be with medical therapy versus complicated PCI procedures that would include rotational atherectomy in the heavy calcification noted throughout the patient's arterial tree.  CLINICAL DATA: Multivessel coronary disease with findings of calcification within the brachiocephalic vessels, evaluate aorta for heavy calcification.  EXAM: CT ANGIOGRAPHY CHEST WITH CONTRAST  TECHNIQUE: Multidetector CT imaging of the chest was performed using the standard protocol during bolus administration of intravenous contrast. Multiplanar CT image reconstructions and MIPs were obtained to evaluate the vascular anatomy.  CONTRAST: 75 mL Isovue 370.  COMPARISON: None.  FINDINGS: The lungs are well aerated bilaterally with diffuse emphysematous changes. Some scarring is noted in the anterior aspect of the left upper lobe inferiorly. No focal infiltrate or sizable parenchymal nodule is seen.  The thoracic inlet demonstrates evidence of a few hypodensities within the left lobe of the thyroid. The largest of these measures 11 mm in dimension. The thoracic aorta demonstrates significant calcific plaque on the underside of the aortic arch and extending into the descending aorta. No dissection flap is identified. Only minimal calcification is noted in the ascending aorta and proximal portion of  the aortic arch. Calcification of the origins of the brachiocephalic arteries is noted with some mild stenosis within the low left subclavian artery identified. Narrowing of the right subclavian artery is noted as well secondary to the calcific changes. The ascending aorta it measures 3.9 cm in greatest dimension at the level of the main pulmonary artery. Just above the coronary sinuses the aorta measures 3.6 cm. Heavy coronary calcifications are noted similar to that noted on prior cardiac catheterization. Diffuse mitral valve calcifications are noted. Pulmonary artery as visualized is within normal limits.  No significant hilar or mediastinal adenopathy is noted. Visualized upper abdomen demonstrates changes of prior cholecystectomy. Some mild perinephric stranding is seen although incompletely evaluated on this exam. No acute bony abnormality is noted.  Review of the MIP images confirms the above findings.  IMPRESSION: Calcifications in the thoracic aorta predominantly involving the transverse portion of the aortic arch  inferiorly as well as the descending thoracic aorta. Only minimal calcification is noted within the ascending aorta. Maximum dimension of the ascending aorta is 3.9 cm.  Calcification within the brachiocephalic vessels with areas of narrowing within both subclavian arteries.  Heavy coronary calcifications consistent with the patient's known history of multivessel coronary disease.  Emphysematous changes consistent with the given clinical history of tobacco use.  Bilateral perinephric stranding of uncertain significance. This is incompletely evaluated on this exam.  Left thyroid nodule. Findings do not meet current SRU consensus criteria for biopsy. Follow-up by clinical exam is recommended. If patient has known risk factors for thyroid carcinoma, consider follow-up ultrasound in 12 months. If patient is clinically hyperthyroid, consider nuclear  medicine thyroid uptake and scan.  Reference: Management of Thyroid Nodules Detected at [place]: Society of Radiologists in [place]. Radiology 2005; [id number].   Electronically Signed  By: Inez Catalina M.D.  On: 10/16/2014 09:19   Impression:  He has severe multi-vessel coronary disease with moderate LV dysfunction and new onset of exertional dyspnea. I agree that CABG is indicated to prevent further ischemia and infarction and to preserve myocardium, improve symptoms. He has severe arterial calcification involving the aortic arch and great vessels seen on fluoroscopy. CT scan shows extensive calcification of the aortic arch, brachiocephalic and subclavian arteries with significant stenosis of both proximal subclavian arteries. The ascending aorta has no significant calcification. I will plan to do a free LIMA graft due to the significant proximal left subclavian calcification and stenoisis. I reviewed his cath findings with him and his family and answered their questions. I discussed the operative procedure with the patient and family including alternatives, benefits and risks; including but not limited to bleeding, blood transfusion, infection, stroke, myocardial infarction, graft failure, heart block requiring a permanent pacemaker, organ dysfunction, and death. Janace Litten understands and agrees to proceed.  Plan:  CABG

## 2014-11-03 NOTE — Transfer of Care (Signed)
Immediate Anesthesia Transfer of Care Note  Patient: Mario [name]  Procedure(s) Performed: Procedure(s): CORONARY ARTERY BYPASS GRAFTING  times three using left internal mammary and right greater saphenous vein (N/A) TRANSESOPHAGEAL ECHOCARDIOGRAM (TEE) (N/A)  Patient Location: SICU  Anesthesia Type:General  Level of Consciousness: sedated and Patient remains intubated per anesthesia plan  Airway & Oxygen Therapy: Patient remains intubated per anesthesia plan and Patient placed on Ventilator (see vital sign flow sheet for setting)  Post-op Assessment: Report given to RN and Post -op Vital signs reviewed and stable  Post vital signs: Reviewed and stable  Last Vitals:  Filed Vitals:   11/03/14 1235  BP: 99/44  Pulse: 85  Temp:   Resp: 12    Complications: No apparent anesthesia complications

## 2014-11-04 ENCOUNTER — Encounter (HOSPITAL_COMMUNITY): Payer: Self-pay | Admitting: Surgery

## 2014-11-04 ENCOUNTER — Inpatient Hospital Stay (HOSPITAL_COMMUNITY): Payer: Commercial Managed Care - HMO

## 2014-11-04 LAB — GLUCOSE, CAPILLARY
Glucose-Capillary: 100 mg/dL — ABNORMAL HIGH (ref 65–99)
Glucose-Capillary: 100 mg/dL — ABNORMAL HIGH (ref 65–99)
Glucose-Capillary: 103 mg/dL — ABNORMAL HIGH (ref 65–99)
Glucose-Capillary: 106 mg/dL — ABNORMAL HIGH (ref 65–99)
Glucose-Capillary: 108 mg/dL — ABNORMAL HIGH (ref 65–99)
Glucose-Capillary: 108 mg/dL — ABNORMAL HIGH (ref 65–99)
Glucose-Capillary: 109 mg/dL — ABNORMAL HIGH (ref 65–99)
Glucose-Capillary: 111 mg/dL — ABNORMAL HIGH (ref 65–99)
Glucose-Capillary: 112 mg/dL — ABNORMAL HIGH (ref 65–99)
Glucose-Capillary: 119 mg/dL — ABNORMAL HIGH (ref 65–99)
Glucose-Capillary: 120 mg/dL — ABNORMAL HIGH (ref 65–99)
Glucose-Capillary: 120 mg/dL — ABNORMAL HIGH (ref 65–99)
Glucose-Capillary: 121 mg/dL — ABNORMAL HIGH (ref 65–99)
Glucose-Capillary: 125 mg/dL — ABNORMAL HIGH (ref 65–99)
Glucose-Capillary: 126 mg/dL — ABNORMAL HIGH (ref 65–99)
Glucose-Capillary: 128 mg/dL — ABNORMAL HIGH (ref 65–99)
Glucose-Capillary: 131 mg/dL — ABNORMAL HIGH (ref 65–99)
Glucose-Capillary: 136 mg/dL — ABNORMAL HIGH (ref 65–99)
Glucose-Capillary: 138 mg/dL — ABNORMAL HIGH (ref 65–99)
Glucose-Capillary: 151 mg/dL — ABNORMAL HIGH (ref 65–99)
Glucose-Capillary: 167 mg/dL — ABNORMAL HIGH (ref 65–99)
Glucose-Capillary: 186 mg/dL — ABNORMAL HIGH (ref 65–99)
Glucose-Capillary: 214 mg/dL — ABNORMAL HIGH (ref 65–99)
Glucose-Capillary: 81 mg/dL (ref 65–99)
Glucose-Capillary: 91 mg/dL (ref 65–99)

## 2014-11-04 LAB — BASIC METABOLIC PANEL
Anion gap: 5 (ref 5–15)
BUN: 18 mg/dL (ref 6–20)
CO2: 23 mmol/L (ref 22–32)
Calcium: 7.8 mg/dL — ABNORMAL LOW (ref 8.9–10.3)
Chloride: 111 mmol/L (ref 101–111)
Creatinine, Ser: 1.09 mg/dL (ref 0.61–1.24)
GFR calc Af Amer: >60 mL/min (ref >60)
GFR calc non Af Amer: >60 mL/min (ref >60)
Glucose, Bld: 99 mg/dL (ref 65–99)
Potassium: 3.9 mmol/L (ref 3.5–5.1)
Sodium: 139 mmol/L (ref 135–145)

## 2014-11-04 LAB — CBC
HCT: 28.4 % — ABNORMAL LOW (ref 39.0–52.0)
Hemoglobin: 9.4 g/dL — ABNORMAL LOW (ref 13.0–17.0)
MCH: 28.4 pg (ref 26.0–34.0)
MCHC: 33.1 g/dL (ref 30.0–36.0)
MCV: 85.8 fL (ref 78.0–100.0)
Platelets: 119 10*3/uL — ABNORMAL LOW (ref 150–400)
RBC: 3.31 MIL/uL — ABNORMAL LOW (ref 4.22–5.81)
RDW: 14.2 % (ref 11.5–15.5)
WBC: 7.6 10*3/uL (ref 4.0–10.5)

## 2014-11-04 LAB — PREPARE PLATELET PHERESIS: Unit division: 0

## 2014-11-04 LAB — TYPE AND SCREEN
ABO/RH(D): O POS
Antibody Screen: NEGATIVE

## 2014-11-04 LAB — MAGNESIUM: Magnesium: 2.2 mg/dL (ref 1.7–2.4)

## 2014-11-04 MED ORDER — ASPIRIN EC 325 MG PO TBEC
325.0000 mg | DELAYED_RELEASE_TABLET | Freq: Every day | ORAL | Status: DC
Start: 2014-11-04 — End: 2014-11-10
  Administered 2014-11-05 – 2014-11-10 (×6): 325 mg via ORAL
  Filled 2014-11-04 (×6): qty 1

## 2014-11-04 MED ORDER — TRAMADOL HCL 50 MG PO TABS
50.0000 mg | ORAL_TABLET | ORAL | Status: DC | PRN
  Administered 2014-11-04 – 2014-11-05 (×3): 100 mg via ORAL
  Filled 2014-11-04 (×4): qty 2

## 2014-11-04 MED ORDER — BISACODYL 10 MG RE SUPP
10.0000 mg | Freq: Every day | RECTAL | Status: DC | PRN
  Administered 2014-11-06: 10 mg via RECTAL
  Filled 2014-11-04 (×2): qty 1

## 2014-11-04 MED ORDER — SODIUM CHLORIDE 0.9 % IV SOLN
250.0000 mL | INTRAVENOUS | Status: DC | PRN
Start: 2014-11-04 — End: 2014-11-10

## 2014-11-04 MED ORDER — OXYCODONE HCL 5 MG PO TABS: 5.0000 mg | ORAL_TABLET | ORAL | Status: DC | PRN

## 2014-11-04 MED ORDER — ONDANSETRON HCL 4 MG PO TABS: 4.0000 mg | ORAL_TABLET | Freq: Four times a day (QID) | ORAL | Status: DC | PRN

## 2014-11-04 MED ORDER — INSULIN ASPART 100 UNIT/ML ~~LOC~~ SOLN
0.0000 [IU] | SUBCUTANEOUS | Status: DC
  Administered 2014-11-04: 4 [IU] via SUBCUTANEOUS
  Administered 2014-11-04: 8 [IU] via SUBCUTANEOUS
  Administered 2014-11-05: 12 [IU] via SUBCUTANEOUS
  Administered 2014-11-05 (×3): 2 [IU] via SUBCUTANEOUS
  Administered 2014-11-05 (×2): 4 [IU] via SUBCUTANEOUS
  Administered 2014-11-06: 8 [IU] via SUBCUTANEOUS
  Administered 2014-11-06: 4 [IU] via SUBCUTANEOUS
  Administered 2014-11-06: 2 [IU] via SUBCUTANEOUS
  Administered 2014-11-06: 4 [IU] via SUBCUTANEOUS
  Administered 2014-11-06 (×2): 2 [IU] via SUBCUTANEOUS
  Administered 2014-11-07 (×3): 8 [IU] via SUBCUTANEOUS
  Administered 2014-11-08: 4 [IU] via SUBCUTANEOUS
  Administered 2014-11-08: 8 [IU] via SUBCUTANEOUS

## 2014-11-04 MED ORDER — ENOXAPARIN SODIUM 40 MG/0.4ML ~~LOC~~ SOLN
40.0000 mg | Freq: Every day | SUBCUTANEOUS | Status: DC
  Administered 2014-11-04 – 2014-11-09 (×6): 40 mg via SUBCUTANEOUS
  Filled 2014-11-04 (×7): qty 0.4

## 2014-11-04 MED ORDER — DOCUSATE SODIUM 100 MG PO CAPS
200.0000 mg | ORAL_CAPSULE | Freq: Every day | ORAL | Status: DC
  Administered 2014-11-05 – 2014-11-10 (×6): 200 mg via ORAL
  Filled 2014-11-04 (×6): qty 2

## 2014-11-04 MED ORDER — INSULIN DETEMIR 100 UNIT/ML ~~LOC~~ SOLN
20.0000 [IU] | Freq: Every day | SUBCUTANEOUS | Status: DC
  Administered 2014-11-04: 20 [IU] via SUBCUTANEOUS
  Filled 2014-11-04 (×2): qty 0.2

## 2014-11-04 MED ORDER — SODIUM CHLORIDE 0.9 % IJ SOLN
3.0000 mL | Freq: Two times a day (BID) | INTRAMUSCULAR | Status: DC
  Administered 2014-11-04 – 2014-11-09 (×12): 3 mL via INTRAVENOUS

## 2014-11-04 MED ORDER — INSULIN DETEMIR 100 UNIT/ML ~~LOC~~ SOLN: 20.0000 [IU] | Freq: Every day | SUBCUTANEOUS | Status: DC

## 2014-11-04 MED ORDER — BISACODYL 5 MG PO TBEC
10.0000 mg | DELAYED_RELEASE_TABLET | Freq: Every day | ORAL | Status: DC | PRN
  Administered 2014-11-05: 10 mg via ORAL
  Filled 2014-11-04: qty 2

## 2014-11-04 MED ORDER — SODIUM CHLORIDE 0.9 % IJ SOLN: 3.0000 mL | INTRAMUSCULAR | Status: DC | PRN

## 2014-11-04 MED ORDER — ONDANSETRON HCL 4 MG/2ML IJ SOLN: 4.0000 mg | Freq: Four times a day (QID) | INTRAMUSCULAR | Status: DC | PRN

## 2014-11-04 MED ORDER — MOVING RIGHT ALONG BOOK
Freq: Once | Status: AC
  Administered 2014-11-04: 14:00:00
  Filled 2014-11-04: qty 1

## 2014-11-04 MED ORDER — FUROSEMIDE 40 MG PO TABS
40.0000 mg | ORAL_TABLET | Freq: Every day | ORAL | Status: AC
  Administered 2014-11-05 – 2014-11-07 (×3): 40 mg via ORAL
  Filled 2014-11-04 (×3): qty 1

## 2014-11-04 MED ORDER — POTASSIUM CHLORIDE CRYS ER 20 MEQ PO TBCR
20.0000 meq | EXTENDED_RELEASE_TABLET | Freq: Two times a day (BID) | ORAL | Status: AC
  Administered 2014-11-04 – 2014-11-06 (×5): 20 meq via ORAL
  Filled 2014-11-04 (×7): qty 1

## 2014-11-04 MED ORDER — CARVEDILOL 3.125 MG PO TABS
3.1250 mg | ORAL_TABLET | Freq: Two times a day (BID) | ORAL | Status: DC
  Administered 2014-11-04 – 2014-11-06 (×5): 3.125 mg via ORAL
  Filled 2014-11-04 (×7): qty 1

## 2014-11-04 MED ORDER — FAMOTIDINE 20 MG PO TABS
20.0000 mg | ORAL_TABLET | Freq: Two times a day (BID) | ORAL | Status: DC
  Administered 2014-11-04 – 2014-11-10 (×12): 20 mg via ORAL
  Filled 2014-11-04 (×13): qty 1

## 2014-11-04 MED ORDER — ACETAMINOPHEN 325 MG PO TABS: 650.0000 mg | ORAL_TABLET | Freq: Four times a day (QID) | ORAL | Status: DC | PRN

## 2014-11-04 MED FILL — Magnesium Sulfate Inj 50%: INTRAMUSCULAR | Qty: 10 | Status: AC

## 2014-11-04 MED FILL — Potassium Chloride Inj 2 mEq/ML: INTRAVENOUS | Qty: 40 | Status: AC

## 2014-11-04 MED FILL — Heparin Sodium (Porcine) Inj 1000 Unit/ML: INTRAMUSCULAR | Qty: 30 | Status: AC

## 2014-11-04 NOTE — Progress Notes (Signed)
1 Day Post-Op Procedure(s) (LRB): CORONARY ARTERY BYPASS GRAFTING  times three using left internal mammary and right greater saphenous vein (N/A) TRANSESOPHAGEAL ECHOCARDIOGRAM (TEE) (N/A) Subjective: No complaints  Objective: Vital signs in last 24 hours: Temp:  [97.2 F (36.2 C)-99 F (37.2 C)] 98.8 F (37.1 C) (06/14 0745) Pulse Rate:  [68-86] 75 (06/14 0745) Cardiac Rhythm:  [-] Normal sinus rhythm (06/14 0745) Resp:  [12-23] 13 (06/14 0745) BP: (94-151)/(40-84) 114/40 mmHg (06/14 0700) SpO2:  [92 %-100 %] 95 % (06/14 0745) Arterial Line BP: (90-160)/(38-74) 129/48 mmHg (06/14 0745) FiO2 (%):  [40 %-50 %] 40 % (06/13 1715) Weight:  [77.4 kg (170 lb 10.2 oz)] 77.4 kg (170 lb 10.2 oz) (06/14 0500)  Hemodynamic parameters for last 24 hours: PAP: (17-38)/(5-19) 22/7 mmHg CO:  [3.6 L/min-5.3 L/min] 4.9 L/min CI:  [1.8 L/min/m2-2.7 L/min/m2] 2.4 L/min/m2  Intake/Output from previous day: 06/13 0701 - 06/14 0700 In: 3242.8 [I.V.:2270.8; Blood:342; NG/GT:30; IV Piggyback:600] Out: 3154 [MGQQP:6195; Emesis/NG output:150; Blood:1200; Chest Tube:570] Intake/Output this shift:    General appearance: alert and cooperative Neurologic: intact Heart: regular rate and rhythm, S1, S2 normal, no murmur, click, rub or gallop Lungs: clear to auscultation bilaterally Extremities: edema mild Wound: dressings dry  Lab Results:  Recent Labs  11/03/14 1815 11/03/14 1820 11/04/14 0400  WBC 8.5  --  7.6  HGB 10.5* 9.9* 9.4*  HCT 30.9* 29.0* 28.4*  PLT 108*  --  119*   BMET:  Recent Labs  11/03/14 1820 11/04/14 0400  NA 140 139  K 4.0 3.9  CL 108 111  CO2  --  23  GLUCOSE 149* 99  BUN 18 18  CREATININE 1.00 1.09  CALCIUM  --  7.8*    PT/INR:  Recent Labs  11/03/14 1252  LABPROT 18.3*  INR 1.51*   ABG    Component Value Date/Time   PHART 7.384 11/03/2014 1816   HCO3 20.2 11/03/2014 1816   TCO2 19 11/03/2014 1820   ACIDBASEDEF 4.0* 11/03/2014 1816   O2SAT 92.0  11/03/2014 1816   CBG (last 3)   Recent Labs  11/04/14 0407 11/04/14 0511 11/04/14 0601  GLUCAP 91 81 106*   CXR: LLL atelectasis ECG: sinus, no acute changes  Assessment/Plan: S/P Procedure(s) (LRB): CORONARY ARTERY BYPASS GRAFTING  times three using left internal mammary and right greater saphenous vein (N/A) TRANSESOPHAGEAL ECHOCARDIOGRAM (TEE) (N/A)  Hypertension: resume Coreg. Will probably need ACE I if creat remains stable. Mobilize Diuresis Diabetes control: Hgb A1c 7.2 preop. Start Levemir and SSI d/c tubes/lines Plan for transfer to step-down: see transfer orders See progression orders   LOS: 1 day    Gaye Pollack 11/04/2014

## 2014-11-04 NOTE — Progress Notes (Signed)
Pt transferred to 2W25 with belongings. Report given to receiving RN and all questions answered. Receiving RN at bedside. VSS during transfer.  Pt assisted to bed in new room. Family at bedside.

## 2014-11-04 NOTE — Progress Notes (Signed)
R IJ d/c at this time; vasoline gauze dressing applied;  pt tolerated well; bedrest until 1800; will cont. To monitor.

## 2014-11-04 NOTE — Progress Notes (Signed)
Utilization review completed.  

## 2014-11-04 NOTE — Progress Notes (Signed)
Pt transferred to 2w25; pt alert and oriented; pt oriented to room; wife at bedside; insulin gtt infusing at this time; will d/c insulin gtt at 1400; pt denies pain at this time; VSS; will cont. To monitor.

## 2014-11-05 ENCOUNTER — Inpatient Hospital Stay (HOSPITAL_COMMUNITY): Payer: Commercial Managed Care - HMO

## 2014-11-05 LAB — GLUCOSE, CAPILLARY
Glucose-Capillary: 150 mg/dL — ABNORMAL HIGH (ref 65–99)
Glucose-Capillary: 158 mg/dL — ABNORMAL HIGH (ref 65–99)
Glucose-Capillary: 185 mg/dL — ABNORMAL HIGH (ref 65–99)
Glucose-Capillary: 260 mg/dL — ABNORMAL HIGH (ref 65–99)
Glucose-Capillary: 156 mg/dL — ABNORMAL HIGH (ref 65–99)
Glucose-Capillary: 177 mg/dL — ABNORMAL HIGH (ref 65–99)

## 2014-11-05 LAB — BASIC METABOLIC PANEL
Anion gap: 8 (ref 5–15)
BUN: 18 mg/dL (ref 6–20)
CO2: 23 mmol/L (ref 22–32)
Calcium: 8 mg/dL — ABNORMAL LOW (ref 8.9–10.3)
Chloride: 105 mmol/L (ref 101–111)
Creatinine, Ser: 1.2 mg/dL (ref 0.61–1.24)
GFR calc Af Amer: >60 mL/min (ref >60)
GFR calc non Af Amer: 58 mL/min — ABNORMAL LOW (ref >60)
Glucose, Bld: 160 mg/dL — ABNORMAL HIGH (ref 65–99)
Potassium: 4.3 mmol/L (ref 3.5–5.1)
Sodium: 136 mmol/L (ref 135–145)

## 2014-11-05 LAB — CBC
HCT: 30.8 % — ABNORMAL LOW (ref 39.0–52.0)
Hemoglobin: 10.1 g/dL — ABNORMAL LOW (ref 13.0–17.0)
MCH: 28.6 pg (ref 26.0–34.0)
MCHC: 32.8 g/dL (ref 30.0–36.0)
MCV: 87.3 fL (ref 78.0–100.0)
Platelets: 112 10*3/uL — ABNORMAL LOW (ref 150–400)
RBC: 3.53 MIL/uL — ABNORMAL LOW (ref 4.22–5.81)
RDW: 14.5 % (ref 11.5–15.5)
WBC: 10.6 10*3/uL — ABNORMAL HIGH (ref 4.0–10.5)

## 2014-11-05 MED ORDER — TRAMADOL HCL 50 MG PO TABS
50.0000 mg | ORAL_TABLET | ORAL | Status: DC | PRN
  Administered 2014-11-05 – 2014-11-09 (×2): 50 mg via ORAL
  Filled 2014-11-05 (×2): qty 1

## 2014-11-05 MED ORDER — ZOLPIDEM TARTRATE 5 MG PO TABS: 5.0000 mg | ORAL_TABLET | Freq: Every evening | ORAL | Status: DC | PRN

## 2014-11-05 MED ORDER — GLIPIZIDE ER 10 MG PO TB24
10.0000 mg | ORAL_TABLET | Freq: Every day | ORAL | Status: DC
  Administered 2014-11-05 – 2014-11-09 (×5): 10 mg via ORAL
  Filled 2014-11-05 (×6): qty 1

## 2014-11-05 MED ORDER — DIPHENHYDRAMINE HCL 25 MG PO CAPS: 25.0000 mg | ORAL_CAPSULE | Freq: Every evening | ORAL | Status: DC | PRN

## 2014-11-05 MED ORDER — LINAGLIPTIN 5 MG PO TABS
5.0000 mg | ORAL_TABLET | Freq: Every day | ORAL | Status: DC
  Administered 2014-11-05 – 2014-11-10 (×6): 5 mg via ORAL
  Filled 2014-11-05 (×6): qty 1

## 2014-11-05 MED ORDER — LISINOPRIL 5 MG PO TABS
5.0000 mg | ORAL_TABLET | Freq: Every day | ORAL | Status: DC
  Administered 2014-11-05 – 2014-11-07 (×3): 5 mg via ORAL
  Filled 2014-11-05 (×4): qty 1

## 2014-11-05 MED ORDER — OXYCODONE HCL 5 MG PO TABS
5.0000 mg | ORAL_TABLET | ORAL | Status: DC | PRN
  Administered 2014-11-06 – 2014-11-09 (×2): 5 mg via ORAL
  Filled 2014-11-05 (×2): qty 1

## 2014-11-05 NOTE — Progress Notes (Signed)
Came to ambulate pt however he sts he just got in bed and wants to take a nap. Sts he will walk later after he rests. Pt asking questions about CRPII and discussed program. He is interested and I will send referral to [place]. Encouraged pt to continue with IS and walking x2 more today. Will f/u in am. SaO2 93 RA lying flat in bed.  [id number] Yves Dill CES, ACSM 2:29 PM 11/05/2014

## 2014-11-05 NOTE — Progress Notes (Signed)
New orders placed for benadryl PRN for sleep; pt made aware; will cont. To monitor.

## 2014-11-05 NOTE — Progress Notes (Addendum)
      [address]       [place] [address]             [phone]        2 Days Post-Op Procedure(s) (LRB): CORONARY ARTERY BYPASS GRAFTING  times three using left internal mammary and right greater saphenous vein (N/A) TRANSESOPHAGEAL ECHOCARDIOGRAM (TEE) (N/A)  Subjective: Patient was upset earlier this am because he was left in the hallway for 20 minutes after his CXR and his breakfast was getting cold. No specific complaints this am.  Objective: Vital signs in last 24 hours: Temp:  [97.4 F (36.3 C)-99 F (37.2 C)] 97.9 F (36.6 C) (06/15 0414) Pulse Rate:  [73-98] 98 (06/15 0414) Cardiac Rhythm:  [-] Normal sinus rhythm (06/15 0731) Resp:  [16-22] 22 (06/15 0414) BP: (112-173)/(46-79) 142/68 mmHg (06/15 0534) SpO2:  [96 %-100 %] 100 % (06/15 0414) Arterial Line BP: (128-156)/(45-52) 146/49 mmHg (06/14 1000) Weight:  [171 lb (77.565 kg)] 171 lb (77.565 kg) (06/15 0414)  Pre op weight 75.8 kg Current Weight  11/05/14 171 lb (77.565 kg)    Hemodynamic parameters for last 24 hours: PAP: (21-34)/(7-14) 34/14 mmHg  Intake/Output from previous day: 06/14 0701 - 06/15 0700 In: 942.1 [P.O.:600; I.V.:192.1; IV Piggyback:150] Out: 465 [Urine:425; Chest Tube:40]   Physical Exam:  Cardiovascular: RRR Pulmonary: Diminished at bases bilaterally and coarse breath sounds bilaterally. Abdomen: Soft, non tender, bowel sounds present. Extremities: Mild bilateral lower extremity edema. Wounds: Sternal dressing is clean and dry. RLE wounds are clean and dry.  Lab Results: CBC: Recent Labs  11/04/14 0400 11/05/14 0420  WBC 7.6 10.6*  HGB 9.4* 10.1*  HCT 28.4* 30.8*  PLT 119* 112*   BMET:  Recent Labs  11/04/14 0400 11/05/14 0420  NA 139 136  K 3.9 4.3  CL 111 105  CO2 23 23  GLUCOSE 99 160*  BUN 18 18  CREATININE 1.09 1.20  CALCIUM 7.8* 8.0*    PT/INR:  Lab Results  Component Value Date   INR 1.51* 11/03/2014   INR 1.12 10/30/2014   INR 1.0 09/23/2014   ABG:  INR: Will add last result for INR, ABG once components are confirmed Will add last 4 CBG results once components are confirmed  Assessment/Plan:  1. CV - SR in the 90's. On Coreg 3.125 mg bid. Hypertensive so will start Lisinopril 5 mg daily. 2.  Pulmonary - On 3 liters of oxygen via [place]. Wean as tolerates. CXR shows no pneumothorax, small left pleural effusion with atelectasis. Encourage incentive spirometer 3. Volume Overload - On Lasix 40 daily 4.  Acute blood loss anemia - H and H stable at 10.1 and 30.8. On daily ferrous sulfate. 5. DM-CBGs  186/158/150. Pre op HGA1C 7.2. On Glipizide 10 mg at hs, Metformin 1000 mg bid, and Januvia 100 mg daily pre op. Will restart Glipizide and Januvia and stop scheduled Insulin. Monitor creatinine. Hope to restart Metformin in am. 6. Mild thrombocytopenia-platelets 112,000 7. Continue with CRPI  Ayani Ospina MPA-C 11/05/2014,8:48 AM

## 2014-11-05 NOTE — Progress Notes (Signed)
Pt ambulated 350 feet with rolling walker; steady gait noted; pt initially found walking in hallway independently; pt strongly encouraged to call for assistance when getting up OOB or out of chair; pt reminded of sternal precautions; pt back to room to chair; O2 sats 96% RA during ambulation and once back to room; call bell w/i reach; will cont. To monitor.

## 2014-11-05 NOTE — Progress Notes (Signed)
Pt ambulated 350 feet with rolling walker; steady gait noted; pt noted to have SOB on exertion while ambulating; pt back to room to chair; call bell w/i reach; wife in room; will cont. To monitor.

## 2014-11-05 NOTE — Progress Notes (Signed)
Pt requesting sleeping pill for tonight; PA paged; will await callback.

## 2014-11-06 LAB — GLUCOSE, CAPILLARY
Glucose-Capillary: 118 mg/dL — ABNORMAL HIGH (ref 65–99)
Glucose-Capillary: 120 mg/dL — ABNORMAL HIGH (ref 65–99)
Glucose-Capillary: 137 mg/dL — ABNORMAL HIGH (ref 65–99)
Glucose-Capillary: 137 mg/dL — ABNORMAL HIGH (ref 65–99)
Glucose-Capillary: 199 mg/dL — ABNORMAL HIGH (ref 65–99)
Glucose-Capillary: 200 mg/dL — ABNORMAL HIGH (ref 65–99)
Glucose-Capillary: 229 mg/dL — ABNORMAL HIGH (ref 65–99)

## 2014-11-06 LAB — BASIC METABOLIC PANEL
Anion gap: 7 (ref 5–15)
BUN: 23 mg/dL — ABNORMAL HIGH (ref 6–20)
CO2: 23 mmol/L (ref 22–32)
Calcium: 8.2 mg/dL — ABNORMAL LOW (ref 8.9–10.3)
Chloride: 106 mmol/L (ref 101–111)
Creatinine, Ser: 1.26 mg/dL — ABNORMAL HIGH (ref 0.61–1.24)
GFR calc Af Amer: >60 mL/min (ref >60)
GFR calc non Af Amer: 55 mL/min — ABNORMAL LOW (ref >60)
Glucose, Bld: 157 mg/dL — ABNORMAL HIGH (ref 65–99)
Potassium: 4.4 mmol/L (ref 3.5–5.1)
Sodium: 136 mmol/L (ref 135–145)

## 2014-11-06 MED ORDER — DIPHENHYDRAMINE HCL 25 MG PO CAPS
25.0000 mg | ORAL_CAPSULE | Freq: Every day | ORAL | Status: DC
  Administered 2014-11-06 – 2014-11-09 (×4): 25 mg via ORAL
  Filled 2014-11-06 (×6): qty 1

## 2014-11-06 MED ORDER — CARVEDILOL 6.25 MG PO TABS
6.2500 mg | ORAL_TABLET | Freq: Two times a day (BID) | ORAL | Status: DC
  Administered 2014-11-06 – 2014-11-10 (×8): 6.25 mg via ORAL
  Filled 2014-11-06 (×10): qty 1

## 2014-11-06 NOTE — Progress Notes (Signed)
CARDIAC REHAB PHASE I   PRE:  Rate/Rhythm:82 SR  BP:  Lying: 118/56        SaO2: 97 RA  MODE:  Ambulation: 350 ft   POST:  Rate/Rhythm: 89 SR  BP:  Sitting: 133/61         SaO2: 97 RA  Pt reluctant to ambulate, states he hasn't walked today, ultimately agreeable. Pt states "I don't feel as good today as yesterday." Pt ambulated 350 ft on RA, rolling walker, assist x1, mildly unsteady gait, tolerated fair. Pt reports DOE, appears short of breath with audible crackles, O2 95-97% on RA, denies CP, dizziness, declined rest stop. Pt states he does not have a walker at home but does not think he has room for one, pt states he lives in a "small apartment, up19 stairs." Pt encouraged to ambulate again today, and to be up in chair as much as possible. Pt requested to return to bed after walk, call bell within reach, wife at bedside.  [phone]    Lenna Sciara, RN, BSN 11/06/2014 2:00 PM

## 2014-11-06 NOTE — Progress Notes (Signed)
11/06/2014 1715 Pt with change in rhythm.  Pt is going back in forth from NSR to AFIB/Flutter.  Pt is without c/o.  BP is stable.  HR will go up to the 140's when moving around then down to 90's.  Notified Suzzanne Cloud PA of changes in rhythm.  Orders received to increase Coreg dose.   Carney Corners

## 2014-11-06 NOTE — Progress Notes (Addendum)
      [address]       [place] [address]             [phone]        3 Days Post-Op Procedure(s) (LRB): CORONARY ARTERY BYPASS GRAFTING  times three using left internal mammary and right greater saphenous vein (N/A) TRANSESOPHAGEAL ECHOCARDIOGRAM (TEE) (N/A)  Subjective: Patient having difficulty sleeping. No other complaints.  Objective: Vital signs in last 24 hours: Temp:  [98.1 F (36.7 C)-98.2 F (36.8 C)] 98.1 F (36.7 C) (06/16 0452) Pulse Rate:  [80-93] 93 (06/16 0452) Cardiac Rhythm:  [-] Normal sinus rhythm;Sinus tachycardia (06/15 2100) Resp:  [18-22] 20 (06/16 0452) BP: (110-155)/(60-89) 133/89 mmHg (06/16 0452) SpO2:  [94 %-100 %] 97 % (06/16 0452) Weight:  [171 lb 8 oz (77.792 kg)] 171 lb 8 oz (77.792 kg) (06/16 0452)  Pre op weight 75.8 kg Current Weight  11/06/14 171 lb 8 oz (77.792 kg)       Intake/Output from previous day: 06/15 0701 - 06/16 0700 In: 840 [P.O.:840] Out: 750 [Urine:750]   Physical Exam:  Cardiovascular: RRR Pulmonary: Coarse breath sounds bilaterally Abdomen: Soft, non tender, bowel sounds present. Extremities: Mild bilateral lower extremity edema. Wounds: Clean and dry. No signs of infection.  Lab Results: CBC:  Recent Labs  11/04/14 0400 11/05/14 0420  WBC 7.6 10.6*  HGB 9.4* 10.1*  HCT 28.4* 30.8*  PLT 119* 112*   BMET:   Recent Labs  11/05/14 0420 11/06/14 0504  NA 136 136  K 4.3 4.4  CL 105 106  CO2 23 23  GLUCOSE 160* 157*  BUN 18 23*  CREATININE 1.20 1.26*  CALCIUM 8.0* 8.2*    PT/INR:  Lab Results  Component Value Date   INR 1.51* 11/03/2014   INR 1.12 10/30/2014   INR 1.0 09/23/2014   ABG:  INR: Will add last result for INR, ABG once components are confirmed Will add last 4 CBG results once components are confirmed  Assessment/Plan:  1. CV - SR in the 90's. On Coreg 3.125 mg bid and Lisinopril 5 mg daily. Monitor BP as may need to increase Lisinopril. 2.   Pulmonary - On room air.  Encourage incentive spirometer 3. Volume Overload - On Lasix 40 daily 4.  Acute blood loss anemia - H and H stable at 10.1 and 30.8. On daily ferrous sulfate and folic acid. 5. DM-CBGs  177/118/120. Pre op HGA1C 7.2. On Glipizide 10 mg at hs and Januvia 100 mg daily as taken pre op. Will restart Metformin at discharge. 6. Mild thrombocytopenia-platelets 112,000 7. Creatinine stable at 1.26 8. Benadryl at hs for sleep 9. Remove EPW 10. Possibly home in 1-2 days  ZIMMERMAN,DONIELLE MPA-C 11/06/2014,7:26 AM   Chart reviewed, patient examined, agree with above. Will repeat creatinine tomorrow since on lisinopril and lasix.

## 2014-11-06 NOTE — Progress Notes (Signed)
11/06/2014 0900 EPW D/C'd per order and per protocol.  Ends intact. Pt. Tolerated well.  Advised bedrest x1hr.  Call bell in reach.  Vital signs collected per protocol. [place]

## 2014-11-06 NOTE — Discharge Instructions (Signed)
Activity: 1.May walk up steps                2.No lifting more than ten pounds for four weeks.                 3.No driving for four weeks.                4.Stop any activity that causes chest pain, shortness of breath, dizziness, sweating or excessive weakness.                5.Avoid straining.                6.Continue with your breathing exercises daily.  Diet: Diabetic diet and Low fat, Low salt diet  Wound Care: May shower.  Clean wounds with mild soap and water daily. Contact the office at [phone] if any problems arise.  Coronary Artery Bypass Grafting, Care After Refer to this sheet in the next few weeks. These instructions provide you with information on caring for yourself after your procedure. Your health care provider may also give you more specific instructions. Your treatment has been planned according to current medical practices, but problems sometimes occur. Call your health care provider if you have any problems or questions after your procedure. WHAT TO EXPECT AFTER THE PROCEDURE Recovery from surgery will be different for everyone. Some people feel well after 3 or 4 weeks, while for others it takes longer. After your procedure, it is typical to have the following:  Nausea and a lack of appetite.   Constipation.  Weakness and fatigue.   Depression or irritability.   Pain or discomfort at your incision site. HOME CARE INSTRUCTIONS  Take medicines only as directed by your health care provider. Do not stop taking medicines or start any new medicines without first checking with your health care provider.  Take your pulse as directed by your health care provider.  Perform deep breathing as directed by your health care provider. If you were given a device called an incentive spirometer, use it to practice deep breathing several times a day. Support your chest with a pillow or your arms when you take deep breaths or cough.  Keep incision areas clean, dry, and  protected. Remove or change any bandages (dressings) only as directed by your health care provider. You may have skin adhesive strips over the incision areas. Do not take the strips off. They will fall off on their own.  Check incision areas daily for any swelling, redness, or drainage.  If incisions were made in your legs, do the following:  Avoid crossing your legs.   Avoid sitting for long periods of time. Change positions every 30 minutes.   Elevate your legs when you are sitting.  Wear compression stockings as directed by your health care provider. These stockings help keep blood clots from forming in your legs.  Take showers once your health care provider approves. Until then, only take sponge baths. Pat incisions dry. Do not rub incisions with a washcloth or towel. Do not take baths, swim, or use a hot tub until your health care provider approves.  Eat foods that are high in fiber, such as raw fruits and vegetables, whole grains, beans, and nuts. Meats should be lean cut. Avoid canned, processed, and fried foods.  Drink enough fluid to keep your urine clear or pale yellow.  Weigh yourself every day. This helps identify if you are retaining fluid that may make your heart and lungs  work harder.  Rest and limit activity as directed by your health care provider. You may be instructed to:  Stop any activity at once if you have chest pain, shortness of breath, irregular heartbeats, or dizziness. Get help right away if you have any of these symptoms.  Move around frequently for short periods or take short walks as directed by your health care provider. Increase your activities gradually. You may need physical therapy or cardiac rehabilitation to help strengthen your muscles and build your endurance.  Avoid lifting, pushing, or pulling anything heavier than 10 lb (4.5 kg) for at least 6 weeks after surgery.  Do not drive until your health care provider approves.  Ask your health  care provider when you may return to work.  Ask your health care provider when you may resume sexual activity.  Keep all follow-up visits as directed by your health care provider. This is important. SEEK MEDICAL CARE IF:  You have swelling, redness, increasing pain, or drainage at the site of an incision.  You have a fever.  You have swelling in your ankles or legs.  You have pain in your legs.   You gain 2 or more pounds (0.9 kg) a day.  You are nauseous or vomit.  You have diarrhea. SEEK IMMEDIATE MEDICAL CARE IF:  You have chest pain that goes to your jaw or arms.  You have shortness of breath.   You have a fast or irregular heartbeat.   You notice a "clicking" in your breastbone (sternum) when you move.   You have numbness or weakness in your arms or legs.  You feel dizzy or light-headed.  MAKE SURE YOU:  Understand these instructions.  Will watch your condition.  Will get help right away if you are not doing well or get worse. Document Released: 11/26/2004 Document Revised: 09/23/2013 Document Reviewed: 10/16/2012 [facility] Patient Information 2015 [place], [place]. This information is not intended to replace advice given to you by your health care provider. Make sure you discuss any questions you have with your health care provider.  Aortic Valve Replacement, Care After Refer to this sheet in the next few weeks. These instructions provide you with information on caring for yourself after your procedure. Your health care provider may also give you specific instructions. Your treatment has been planned according to current medical practices, but problems sometimes occur. Call your health care provider if you have any problems or questions after your procedure. HOME CARE INSTRUCTIONS   Take medicines only as directed by your health care provider.  If your health care provider has prescribed elastic stockings, wear them as directed.  Take frequent naps or rest  often throughout the day.  Avoid lifting over 10 lbs (4.5 kg) or pushing or pulling things with your arms for 6-8 weeks or as directed by your health care provider.  Avoid driving or airplane travel for 4-6 weeks after surgery or as directed by your health care provider. If you are riding in a car for an extended period, stop every 1-2 hours to stretch your legs. Keep a record of your medicines and medical history with you when traveling.  Do not drive or operate heavy machinery while taking pain medicine. (narcotics).  Do not cross your legs.  Do not use any tobacco products including cigarettes, chewing tobacco, or electronic cigarettes. If you need help quitting, ask your health care provider.  Do not take baths, swim, or use a hot tub until your health care provider approves. Take  showers once your health care provider approves. Pat incisions dry. Do not rub incisions with a washcloth or towel.  Avoid climbing stairs and using the handrail to pull yourself up for the first 2-3 weeks after surgery.  Return to work as directed by your health care provider.  Drink enough fluid to keep your urine clear or pale yellow.  Do not strain to have a bowel movement. Eat high-fiber foods if you become constipated. You may also take a medicine to help you have a bowel movement (laxative) as directed by your health care provider.  Resume sexual activity as directed by your health care provider. Men should not use medicines for erectile dysfunction until their doctor says it isokay.  If you had a certain type of heart condition in the past, you may need to take antibiotic medicine before having dental work or surgery. Let your dentist and health care providers know if you had one or more of the following:  Previous endocarditis.  An artificial (prosthetic) heart valve.  Congenital heart disease. SEEK MEDICAL CARE IF:  You develop a skin rash.   You experience sudden changes in your  weight.  You have a fever. SEEK IMMEDIATE MEDICAL CARE IF:   You develop chest pain that is not coming from your incision.  You have drainage (pus), redness, swelling, or pain at your incision site.   You develop shortness of breath or have difficulty breathing.   You have increased bleeding from your incision site.   You develop light-headedness.  MAKE SURE YOU:   Understand these directions.  Will watch your condition.  Will get help right away if you are not doing well or get worse. Document Released: 11/25/2004 Document Revised: 09/23/2013 Document Reviewed: 02/21/2012 [facility] Patient Information 2015 [place], [place]. This information is not intended to replace advice given to you by your health care provider. Make sure you discuss any questions you have with your health care provider.

## 2014-11-06 NOTE — Discharge Summary (Signed)
Physician Discharge Summary       [address]       [place] [address]             [phone]    Patient ID: Mario [name] MRN: [id number] DOB/AGE: February 20, 1941 74 y.o.  Admit date: 11/03/2014 Discharge date: 11/10/2014  Admission Diagnoses: 1. History of CAD (s/p PCI with BMS) 2. History of essential hypertension 3. History of hyperlipidemia 4. History of CKD (stage II) 5. History of DM Type II 6. History of PAD 7. History of tobacco abuse  Discharge Diagnoses:  1. History of CAD (s/p PCI with BMS) 2. History of essential hypertension 3. History of hyperlipidemia 4. History of CKD (stage II) 5. History of DM Type II 6. History of PAD 7. History of tobacco abuse 8. Possible bronchitis  Procedure (s):  1. Median Sternotomy 2. Extracorporeal circulation 3. Coronary artery bypass grafting x 3   Free left internal mammary graft to the LAD  SVG to OM  SVG to distal RCA  4. Endoscopic vein harvest from the right leg by Dr. [name] on 11/03/2014.  History of Presenting Illness: The patient is a 74 year old gentleman with hypertension, hyperlipidemia, type 2 DM, stage 2 CKD, and known coronary artery disease s/p IMI and BMS in 1997. He now has new onset dyspnea on exertion that he noticed when lifting stacks of newspapers that he was helping his son deliver. He has had no symptoms at rest and no chest pain. He had a high risk nuclear stress test showing a large scar in the RCA territory with minimal periinfarct ischemia in the mid inferoseptal and apical inferior walls with an LVEF of 34%. There was akinesis in the basal and mid inferolateral and inferior walls and in the basal inferoseptal wall. Cardiac cath on 10/02/2014 showed severe calcification of the coronary arteries, mitral annulus, aortic arch and great vessels. This prevented performing the procedure from the right radial approach due to the heavy calcification in the subclavian and innominate  arteries preventing advancement of the catheter in to the aorta. There was a severe 90% mid RCA stenosis, 80% mid LAD and 70% diagonal stenosis, and 85% proximal LCX and OM1 stenosis. LVEF was 40%.  He has severe multi-vessel coronary disease with moderate LV dysfunction and new onset of exertional dyspnea. Dr. [name] discussed the need for CABG  to prevent further ischemia and infarction and to preserve myocardium, improve symptoms. He has severe arterial calcification involving the aortic arch and great vessels seen on fluoroscopy. CT scan shows extensive calcification of the aortic arch, brachiocephalic and subclavian arteries with significant stenosis of both proximal subclavian arteries. The ascending aorta has no significant calcification. Dr. [name] planned to do a free LIMA graft due to the significant proximal left subclavian calcification and stenoisis. Potential risks, benefits, and complications were discussed with the patient and he agreed to proceed with surgery. He underwent a CABG x 3 on 11/03/2014  Brief Hospital Course:  The patient was extubated the evening of surgery without difficulty. He remained afebrile and hemodynamically stable. Gordy Councilman, a line, chest tubes, and foley were removed early in the post operative course. Coreg was started. He was volume over loaded and diuresed. He had ABL anemia. He did not require a post op transfusion. His last H and H was 10.1 and 30.8. He was weaned off the insulin drip. The patient's glucose remained well controlled. The patient's HGA1C pre op was 7.2. Once he was tolerating a  diet, he was restarted on  Januvia and Glipizide.  Metformin will be restarted at discharge. The patient was felt surgically stable for transfer from the ICU to [facility] for further convalescence on 11/04/2014. He continues to progress with cardiac rehab. He was ambulating on room air. He has been tolerating a diet and has had a bowel movement. Epicardial pacing wires were  removed 06/16. He then went into a fib with HR into the 140's. His Coreg was increased to 6.25 mg bid. He remained in sinus rhythm. He developed a productive cough. He has a history of COPD. It was felt he may have bronchitis. CXR remained stable-no signs of pneumonia. He was started on empiric oral Levaquin. He was also give Duo nebs. He is feeling better today and would like to go home.The patient is felt surgically stable for discharge today.   Latest Vital Signs: Blood pressure 140/77, pulse 83, temperature 98 F (36.7 C), temperature source Oral, resp. rate 16, height $RemoveBe'6\' 2"'NXyUDcrqx$  (1.88 m), weight 164 lb 14.5 oz (74.8 kg), SpO2 99 %.  Physical Exam: Cardiovascular: RRR Pulmonary: Coarse breath sounds bilaterally Abdomen: Soft, non tender, bowel sounds present. Extremities: Mild bilateral lower extremity edema. Wounds: Clean and dry. No signs of infection.  Discharge Condition:Stable and discharged to home  Recent laboratory studies:  Lab Results  Component Value Date   WBC 10.6* 11/05/2014   HGB 10.1* 11/05/2014   HCT 30.8* 11/05/2014   MCV 87.3 11/05/2014   PLT 112* 11/05/2014   Lab Results  Component Value Date   NA 139 11/07/2014   K 4.2 11/07/2014   CL 107 11/07/2014   CO2 24 11/07/2014   CREATININE 1.27* 11/07/2014   GLUCOSE 129* 11/07/2014      Diagnostic Studies:  EXAM: CHEST 2 VIEW  COMPARISON: 11/08/2014  FINDINGS: Sternotomy wires overlie normal cardiac silhouette. Lungs are hyperinflated. There bilateral small effusions not changed from prior. Atherosclerotic calcification aorta.  IMPRESSION: 1. No interval change. 2. Hyperinflated lungs with small effusions.   Electronically Signed  By: Suzy Bouchard M.D.  On: 11/09/2014 09:04  Ct Angio Chest Aorta W/cm &/or Wo/cm  10/16/2014   CLINICAL DATA:  Multivessel coronary disease with findings of calcification within the brachiocephalic vessels, evaluate aorta for heavy calcification.  EXAM: CT  ANGIOGRAPHY CHEST WITH CONTRAST  TECHNIQUE: Multidetector CT imaging of the chest was performed using the standard protocol during bolus administration of intravenous contrast. Multiplanar CT image reconstructions and MIPs were obtained to evaluate the vascular anatomy.  CONTRAST:  75 mL Isovue 370.  COMPARISON:  None.  FINDINGS: The lungs are well aerated bilaterally with diffuse emphysematous changes. Some scarring is noted in the anterior aspect of the left upper lobe inferiorly. No focal infiltrate or sizable parenchymal nodule is seen.  The thoracic inlet demonstrates evidence of a few hypodensities within the left lobe of the thyroid. The largest of these measures 11 mm in dimension. The thoracic aorta demonstrates significant calcific plaque on the underside of the aortic arch and extending into the descending aorta. No dissection flap is identified. Only minimal calcification is noted in the ascending aorta and proximal portion of the aortic arch. Calcification of the origins of the brachiocephalic arteries is noted with some mild stenosis within the low left subclavian artery identified. Narrowing of the right subclavian artery is noted as well secondary to the calcific changes. The ascending aorta it measures 3.9 cm in greatest dimension at the level of the main pulmonary artery. Just above the coronary sinuses  the aorta measures 3.6 cm. Heavy coronary calcifications are noted similar to that noted on prior cardiac catheterization. Diffuse mitral valve calcifications are noted. Pulmonary artery as visualized is within normal limits.  No significant hilar or mediastinal adenopathy is noted. Visualized upper abdomen demonstrates changes of prior cholecystectomy. Some mild perinephric stranding is seen although incompletely evaluated on this exam. No acute bony abnormality is noted.  Review of the MIP images confirms the above findings.  IMPRESSION: Calcifications in the thoracic aorta predominantly involving  the transverse portion of the aortic arch inferiorly as well as the descending thoracic aorta. Only minimal calcification is noted within the ascending aorta. Maximum dimension of the ascending aorta is 3.9 cm.  Calcification within the brachiocephalic vessels with areas of narrowing within both subclavian arteries.  Heavy coronary calcifications consistent with the patient's known history of multivessel coronary disease.  Emphysematous changes consistent with the given clinical history of tobacco use.  Bilateral perinephric stranding of uncertain significance. This is incompletely evaluated on this exam.  Left thyroid nodule. Findings do not meet current SRU consensus criteria for biopsy. Follow-up by clinical exam is recommended. If patient has known risk factors for thyroid carcinoma, consider follow-up ultrasound in 12 months. If patient is clinically hyperthyroid, consider nuclear medicine thyroid uptake and scan.  Reference: Management of Thyroid Nodules Detected at [place]: Society of Radiologists in [place]. Radiology 2005; [id number].   Electronically Signed   By: Inez Catalina M.D.   On: 10/16/2014 09:19        Discharge Instructions    Amb Referral to Cardiac Rehabilitation    Complete by:  As directed   Congestive Heart Failure: If diagnosis is Heart Failure, patient MUST meet each of the CMS criteria: 1. Left Ventricular Ejection Fraction </= 35% 2. NYHA class II-IV symptoms despite being on optimal heart failure therapy for at least 6 weeks. 3. Stable = have not had a recent (<6 weeks) or planned (<6 months) major cardiovascular hospitalization or procedure  Program Details: - Physician supervised classes - 1-3 classes per week over a 12-18 week period, generally for a total of 36 sessions  Physician Certification: I certify that the above Cardiac Rehabilitation treatment is medically necessary and is medically approved by me for treatment of this patient.  The patient is willing and cooperative, able to ambulate and medically stable to participate in exercise rehabilitation. The participant's progress and Individualized Treatment Plan will be reviewed by the Medical Director, Cardiac Rehab staff and as indicated by the Referring/Ordering Physician.  Diagnosis:  CABG           Discharge Medications:   Medication List    STOP taking these medications        nitroGLYCERIN 0.4 MG SL tablet  Commonly known as:  NITROSTAT      TAKE these medications        ACCU-CHEK NANO SMARTVIEW W/DEVICE Kit  Use to check blood sugars 2 times per day dx code 250.02     ACCU-CHEK SMARTVIEW CONTROL Liqd  Use as directed     acetaminophen 500 MG tablet  Commonly known as:  TYLENOL  Take 500 mg by mouth daily.     allopurinol 100 MG tablet  Commonly known as:  ZYLOPRIM  TAKE 1 TABLET EVERY DAY     aspirin 325 MG EC tablet  Take 1 tablet (325 mg total) by mouth daily.     atorvastatin 40 MG tablet  Commonly known as:  LIPITOR  Take  40 mg by mouth daily at 6 PM.     Bromocriptine Mesylate 0.8 MG Tabs  Take 4 mg by mouth daily.     carvedilol 6.25 MG tablet  Commonly known as:  COREG  Take 1 tablet (6.25 mg total) by mouth 2 (two) times daily.     cholecalciferol 1000 UNITS tablet  Commonly known as:  VITAMIN D  Take 2,000 Units by mouth daily.     Co-Enzyme Q-10 100 MG Caps  Take 100 mg by mouth daily.     ferrous sulfate 325 (65 FE) MG tablet  Take 325 mg by mouth daily.     fluorouracil 5 % cream  Commonly known as:  EFUDEX  Apply 1 application topically daily as needed (skin irritations).     folic acid 485 MCG tablet  Commonly known as:  FOLVITE  Take 800 mcg by mouth daily.     glipiZIDE 10 MG 24 hr tablet  Commonly known as:  GLUCOTROL XL  Take 10 mg by mouth at bedtime.     glucose blood test strip  Commonly known as:  ACCU-CHEK SMARTVIEW  Use as instructed to check blood sugar 2 times per day dx code E11.65      guaiFENesin 600 MG 12 hr tablet  Commonly known as:  MUCINEX  Take 1 tablet (600 mg total) by mouth 2 (two) times daily. For cough     ipratropium-albuterol 0.5-2.5 (3) MG/3ML Soln  Commonly known as:  DUONEB  Take 3 mLs by nebulization 2 (two) times daily.     levofloxacin 750 MG tablet  Commonly known as:  LEVAQUIN  Take 1 tablet (750 mg total) by mouth daily at 6 PM. For 3 days then stop.     lisinopril 10 MG tablet  Commonly known as:  PRINIVIL,ZESTRIL  Take 1 tablet (10 mg total) by mouth daily.     metFORMIN 1000 MG tablet  Commonly known as:  GLUCOPHAGE  Take 1 tablet (1,000 mg total) by mouth 2 (two) times daily with a meal.     MICROLET LANCETS Misc  USE ONE LANCET TO CHECK GLUCOSE TWICE DAILY AS DIRECTED DX CODE E11.65     miglitol 50 MG tablet  Commonly known as:  GLYSET  Take 50 mg by mouth 2 (two) times daily.     omega-3 acid ethyl esters 1 G capsule  Commonly known as:  LOVAZA  Take 1 g by mouth daily.     PRESERVISION AREDS 2 Caps  Take 2 capsules by mouth daily.     sitaGLIPtin 100 MG tablet  Commonly known as:  JANUVIA  Take 1 tablet (100 mg total) by mouth daily.     tamsulosin 0.4 MG Caps capsule  Commonly known as:  FLOMAX  Take 1 capsule (0.4 mg total) by mouth daily.     vitamin B-12 500 MCG tablet  Commonly known as:  CYANOCOBALAMIN  Take 1,000 mcg by mouth daily.         The patient has been discharged on:   1.Beta Blocker:  Yes [  x ]                              No   [   ]                              If No, reason:  2.Ace  Inhibitor/ARB: Yes [ x  ]                                     No  [    ]                                     If No, reason:  3.Statin:   Yes [  x ]                  No  [   ]                  If No, reason:  4.Ecasa:  Yes  [ x  ]                  No   [   ]                  If No, reason:  Follow Up Appointments: Follow-up Information    Follow up with Murray Hodgkins, NP On 11/21/2014.    Specialties:  Nurse Practitioner, Cardiology, Radiology   Why:  Appointment time is at 9:00 am   Contact information:   [address] [address] [phone]       Follow up with Nurse On 11/13/2014.   Why:  Appointment is for chest tube sutures only. Appointment time is at 10:00 am   Contact information:   [address] [place] [address] [phone]      Follow up with Gaye Pollack, MD On 12/10/2014.   Specialty:  Cardiothoracic Surgery   Why:  PA/LAT CXR to be taken (at [facility] which is in the same building as Dr. Vivi Martens office) on 12/10/2014 at 10:45 am;Appointment time is at 11:30 am   Contact information:   [place] [place] [address] [phone]       Signed: Lars Pinks MPA-C 11/10/2014, 1:41 PM

## 2014-11-06 NOTE — Progress Notes (Signed)
11/06/2014 1830 Will not attempt to walk pt at this time d/t his HR increasing to the 140's when up moving.   Carney Corners

## 2014-11-07 LAB — GLUCOSE, CAPILLARY
Glucose-Capillary: 107 mg/dL — ABNORMAL HIGH (ref 65–99)
Glucose-Capillary: 116 mg/dL — ABNORMAL HIGH (ref 65–99)
Glucose-Capillary: 117 mg/dL — ABNORMAL HIGH (ref 65–99)
Glucose-Capillary: 210 mg/dL — ABNORMAL HIGH (ref 65–99)
Glucose-Capillary: 240 mg/dL — ABNORMAL HIGH (ref 65–99)
Glucose-Capillary: 244 mg/dL — ABNORMAL HIGH (ref 65–99)

## 2014-11-07 LAB — BASIC METABOLIC PANEL
Anion gap: 8 (ref 5–15)
BUN: 23 mg/dL — ABNORMAL HIGH (ref 6–20)
CO2: 24 mmol/L (ref 22–32)
Calcium: 8.1 mg/dL — ABNORMAL LOW (ref 8.9–10.3)
Chloride: 107 mmol/L (ref 101–111)
Creatinine, Ser: 1.27 mg/dL — ABNORMAL HIGH (ref 0.61–1.24)
GFR calc Af Amer: >60 mL/min (ref >60)
GFR calc non Af Amer: 54 mL/min — ABNORMAL LOW (ref >60)
Glucose, Bld: 129 mg/dL — ABNORMAL HIGH (ref 65–99)
Potassium: 4.2 mmol/L (ref 3.5–5.1)
Sodium: 139 mmol/L (ref 135–145)

## 2014-11-07 MED ORDER — GUAIFENESIN ER 600 MG PO TB12
600.0000 mg | ORAL_TABLET | Freq: Two times a day (BID) | ORAL | Status: DC
  Administered 2014-11-07 – 2014-11-10 (×6): 600 mg via ORAL
  Filled 2014-11-07 (×7): qty 1

## 2014-11-07 MED ORDER — LEVOFLOXACIN 750 MG PO TABS
750.0000 mg | ORAL_TABLET | Freq: Every day | ORAL | Status: DC
  Administered 2014-11-07 – 2014-11-09 (×3): 750 mg via ORAL
  Filled 2014-11-07 (×4): qty 1

## 2014-11-07 NOTE — Progress Notes (Signed)
11/07/2014 5:33 PM Pt ambulated 550 ft with rolling walker on RA.  Tolerated well.  Placed back in chair in room, with wife.  Call bell in reach. Carney Corners

## 2014-11-07 NOTE — Progress Notes (Signed)
Ed completed with wife and pt. Voiced understanding. Sts he is done smoking cessation, he did quit for 8 yrs previously. Gave resources. Already signed up for CRPII. Pt and wife will watch video later.  [place], ACSM 2:31 PM 11/07/2014

## 2014-11-07 NOTE — Progress Notes (Signed)
CARDIAC REHAB PHASE I   PRE:  Rate/Rhythm: 82 SR    BP: sitting 114/50    SaO2: 97 1/2L, 93 RA  MODE:  Ambulation: 550 ft   POST:  Rate/Rhythm: 91 SR    BP: sitting 136/70     SaO2: 97 RA  Pt congested this am, worked to cough. Able to walk with RW, no O2, no Afib. Fairly steady. Needs to walk without RW before d/c. Pt stated he felt better after walking. To recliner, left O2 off. Will f/u for ed with wife. [phone]   Josephina Shih [place] CES, ACSM 11/07/2014 10:05 AM

## 2014-11-07 NOTE — Progress Notes (Addendum)
      [address]       [place] [address]             [phone]        4 Days Post-Op Procedure(s) (LRB): CORONARY ARTERY BYPASS GRAFTING  times three using left internal mammary and right greater saphenous vein (N/A) TRANSESOPHAGEAL ECHOCARDIOGRAM (TEE) (N/A)  Subjective: Patient without complaints this am.  Objective: Vital signs in last 24 hours: Temp:  [97.5 F (36.4 C)-98.1 F (36.7 C)] 97.5 F (36.4 C) (06/17 0415) Pulse Rate:  [78-133] 90 (06/17 0415) Cardiac Rhythm:  [-] Normal sinus rhythm;Sinus tachycardia (06/16 2125) Resp:  [18-20] 20 (06/17 0415) BP: (115-137)/(50-79) 131/69 mmHg (06/17 0415) SpO2:  [95 %-97 %] 97 % (06/17 0415) Weight:  [169 lb (76.658 kg)] 169 lb (76.658 kg) (06/17 0415)  Pre op weight 75.8 kg Current Weight  11/07/14 169 lb (76.658 kg)       Intake/Output from previous day: 06/16 0701 - 06/17 0700 In: 483 [P.O.:480; I.V.:3] Out: 950 [Urine:950]   Physical Exam:  Cardiovascular: RRR Pulmonary: Coarse breath sounds bilaterally Abdomen: Soft, non tender, bowel sounds present. Extremities: Mild bilateral lower extremity edema. Wounds: Clean and dry. No signs of infection.  Lab Results: CBC:  Recent Labs  11/05/14 0420  WBC 10.6*  HGB 10.1*  HCT 30.8*  PLT 112*   BMET:   Recent Labs  11/06/14 0504 11/07/14 0310  NA 136 139  K 4.4 4.2  CL 106 107  CO2 23 24  GLUCOSE 157* 129*  BUN 23* 23*  CREATININE 1.26* 1.27*  CALCIUM 8.2* 8.1*    PT/INR:  Lab Results  Component Value Date   INR 1.51* 11/03/2014   INR 1.12 10/30/2014   INR 1.0 09/23/2014   ABG:  INR: Will add last result for INR, ABG once components are confirmed Will add last 4 CBG results once components are confirmed  Assessment/Plan:  1. CV - Afib with increased HR late yesterday. In SR with occ PVCs this am. HR in the 80's. On Coreg 6.25 mg bid and Lisinopril 5 mg daily.  2.  Pulmonary - On 2 liters again because of  previous a fib. Will wean to room air. Encourage incentive spirometer 3. Volume Overload - On Lasix 40 daily 4.  Acute blood loss anemia - H and H stable at 10.1 and 30.8. On daily ferrous sulfate and folic acid. 5. DM-CBGs  229/137/117. Pre op HGA1C 7.2. On Glipizide 10 mg at hs and Januvia 100 mg daily as taken pre op. Will restart Metformin at discharge. 6. Mild thrombocytopenia-platelets 112,000 7. Creatinine stable at 1.27 8. Monitor rhythm this am. Likely consider discharge in am but will discuss with Dr. [name].  ZIMMERMAN,DONIELLE MPA-C 11/07/2014,7:30 AM   Chart reviewed, patient examined, agree with above. He says that his breathing is not as good as it has been. Still has rattling cough with clear sputum. Lungs sound ok but decreased at left base. Will check CXR in the am. He may have bronchitis. Will start on Levaquin and add Mucinex. I don't think he should go home tomorrow unless lungs are clearing up. He has significant COPD.

## 2014-11-08 ENCOUNTER — Inpatient Hospital Stay (HOSPITAL_COMMUNITY): Payer: Commercial Managed Care - HMO

## 2014-11-08 LAB — GLUCOSE, CAPILLARY
Glucose-Capillary: 95 mg/dL (ref 65–99)
Glucose-Capillary: 268 mg/dL — ABNORMAL HIGH (ref 65–99)
Glucose-Capillary: 197 mg/dL — ABNORMAL HIGH (ref 65–99)
Glucose-Capillary: 190 mg/dL — ABNORMAL HIGH (ref 65–99)

## 2014-11-08 MED ORDER — LISINOPRIL 10 MG PO TABS
10.0000 mg | ORAL_TABLET | Freq: Every day | ORAL | Status: DC
  Administered 2014-11-08 – 2014-11-10 (×3): 10 mg via ORAL
  Filled 2014-11-08 (×3): qty 1

## 2014-11-08 NOTE — Progress Notes (Addendum)
      [address]       [place] [address]             [phone]        5 Days Post-Op Procedure(s) (LRB): CORONARY ARTERY BYPASS GRAFTING  times three using left internal mammary and right greater saphenous vein (N/A) TRANSESOPHAGEAL ECHOCARDIOGRAM (TEE) (N/A)  Subjective: Patient states breathing is a little better. Mucinex helping with cough.  Objective: Vital signs in last 24 hours: Temp:  [97.7 F (36.5 C)-98.3 F (36.8 C)] 97.8 F (36.6 C) (06/18 0729) Pulse Rate:  [80-86] 86 (06/18 0737) Cardiac Rhythm:  [-] Normal sinus rhythm;Sinus tachycardia (06/18 0735) Resp:  [18-20] 20 (06/18 0729) BP: (130-175)/(54-105) 144/75 mmHg (06/18 0737) SpO2:  [97 %-100 %] 97 % (06/18 0729) Weight:  [167 lb 11.2 oz (76.068 kg)] 167 lb 11.2 oz (76.068 kg) (06/18 0431)  Pre op weight 75.8 kg Current Weight  11/08/14 167 lb 11.2 oz (76.068 kg)       Intake/Output from previous day: 06/17 0701 - 06/18 0700 In: 723 [P.O.:720; I.V.:3] Out: -    Physical Exam:  Cardiovascular: RRR Pulmonary: Coarse breath sounds bilaterally Abdomen: Soft, non tender, bowel sounds present. Extremities: trace bilateral lower extremity edema. Wounds: Clean and dry. No signs of infection.  Lab Results: CBC: No results for input(s): WBC, HGB, HCT, PLT in the last 72 hours. BMET:   Recent Labs  11/06/14 0504 11/07/14 0310  NA 136 139  K 4.4 4.2  CL 106 107  CO2 23 24  GLUCOSE 157* 129*  BUN 23* 23*  CREATININE 1.26* 1.27*  CALCIUM 8.2* 8.1*    PT/INR:  Lab Results  Component Value Date   INR 1.51* 11/03/2014   INR 1.12 10/30/2014   INR 1.0 09/23/2014   ABG:  INR: Will add last result for INR, ABG once components are confirmed Will add last 4 CBG results once components are confirmed  Assessment/Plan:  1. CV - Afib with increased HR late yesterday. In SR with occ PVCs this am. No more a fib. SR with HR in the 80's. On Coreg 6.25 mg bid and Lisinopril 5 mg  daily. Will increase Lisinopril to 10 mg daily for better BP control. 2.  Pulmonary - History of COPD. On room air. On Levaquin for possible bronchitis and on Mucinex for cough. CXR this am shows small left pleural effusion, no pneumothorax, and bibasilar atlectasis.Encourage incentive spirometer 3. Volume Overload - On Lasix 40 daily 4.  Acute blood loss anemia - H and H stable at 10.1 and 30.8. On daily ferrous sulfate and folic acid. 5. DM-CBGs  210/107/95. Pre op HGA1C 7.2. On Glipizide 10 mg at hs and Januvia 100 mg daily as taken pre op. Will restart Metformin at discharge. 6. Mild thrombocytopenia-platelets 112,000 7. Creatinine stable at 1.27 8. Possibly home in 1-2 days if pulmonary status is stable  ZIMMERMAN,DONIELLE MPA-C 11/08/2014,8:31 AM  patient examined and medical record reviewed,agree with above note.Prob DC home in am if CXR ok Tharon Aquas Trigt III 11/08/2014

## 2014-11-08 NOTE — Progress Notes (Signed)
CARDIAC REHAB PHASE I   PRE:  Rate/Rhythm: 91 SR  BP:  Supine:   Sitting: 140/70  Standing:    SaO2: 95%RA  MODE:  Ambulation: 550 ft   POST:  Rate/Rhythm: 92SR  BP:  Supine:   Sitting: 140/70  Standing:    SaO2: 97%RA hall and 95% room [phone] Pt walked 550 ft on RA with rolling walker with little assistance. Stopped once to rest. Some DOE noted and wheezing. Sats good on RA. Encouraged IS and flutter valve.   Graylon Good, RN BSN  11/08/2014 9:30 AM

## 2014-11-09 ENCOUNTER — Inpatient Hospital Stay (HOSPITAL_COMMUNITY): Payer: Commercial Managed Care - HMO

## 2014-11-09 LAB — GLUCOSE, CAPILLARY
Glucose-Capillary: 133 mg/dL — ABNORMAL HIGH (ref 65–99)
Glucose-Capillary: 238 mg/dL — ABNORMAL HIGH (ref 65–99)
Glucose-Capillary: 243 mg/dL — ABNORMAL HIGH (ref 65–99)

## 2014-11-09 MED ORDER — ASPIRIN 325 MG PO TBEC: 325.0000 mg | DELAYED_RELEASE_TABLET | Freq: Every day | ORAL | Status: DC

## 2014-11-09 MED ORDER — IPRATROPIUM-ALBUTEROL 0.5-2.5 (3) MG/3ML IN SOLN
3.0000 mL | Freq: Two times a day (BID) | RESPIRATORY_TRACT | Status: DC
  Filled 2014-11-09: qty 3

## 2014-11-09 MED ORDER — CARVEDILOL 6.25 MG PO TABS: 6.2500 mg | ORAL_TABLET | Freq: Two times a day (BID) | ORAL | Status: DC

## 2014-11-09 MED ORDER — IPRATROPIUM-ALBUTEROL 0.5-2.5 (3) MG/3ML IN SOLN: 3.0000 mL | Freq: Two times a day (BID) | RESPIRATORY_TRACT | Status: DC

## 2014-11-09 MED ORDER — LEVOFLOXACIN 750 MG PO TABS: 750.0000 mg | ORAL_TABLET | Freq: Every day | ORAL | Status: DC

## 2014-11-09 MED ORDER — IPRATROPIUM-ALBUTEROL 20-100 MCG/ACT IN AERS: 2.0000 | INHALATION_SPRAY | RESPIRATORY_TRACT | Status: DC

## 2014-11-09 MED ORDER — GUAIFENESIN ER 600 MG PO TB12: 600.0000 mg | ORAL_TABLET | Freq: Two times a day (BID) | ORAL | Status: DC

## 2014-11-09 MED ORDER — IPRATROPIUM-ALBUTEROL 0.5-2.5 (3) MG/3ML IN SOLN: 3.0000 mL | RESPIRATORY_TRACT | Status: DC

## 2014-11-09 MED ORDER — BUDESONIDE-FORMOTEROL FUMARATE 160-4.5 MCG/ACT IN AERO: 2.0000 | INHALATION_SPRAY | Freq: Two times a day (BID) | RESPIRATORY_TRACT | Status: DC

## 2014-11-09 MED ORDER — POTASSIUM CHLORIDE CRYS ER 20 MEQ PO TBCR
20.0000 meq | EXTENDED_RELEASE_TABLET | Freq: Every day | ORAL | Status: DC
  Administered 2014-11-09 – 2014-11-10 (×2): 20 meq via ORAL
  Filled 2014-11-09: qty 1

## 2014-11-09 MED ORDER — IPRATROPIUM-ALBUTEROL 20-100 MCG/ACT IN AERS: 1.0000 | INHALATION_SPRAY | Freq: Two times a day (BID) | RESPIRATORY_TRACT | Status: DC

## 2014-11-09 MED ORDER — LISINOPRIL 10 MG PO TABS: 10.0000 mg | ORAL_TABLET | Freq: Every day | ORAL | Status: DC

## 2014-11-09 MED ORDER — IPRATROPIUM-ALBUTEROL 0.5-2.5 (3) MG/3ML IN SOLN
3.0000 mL | Freq: Two times a day (BID) | RESPIRATORY_TRACT | Status: DC
  Administered 2014-11-09 – 2014-11-10 (×2): 3 mL via RESPIRATORY_TRACT
  Filled 2014-11-09: qty 3

## 2014-11-09 MED ORDER — FUROSEMIDE 40 MG PO TABS
40.0000 mg | ORAL_TABLET | Freq: Every day | ORAL | Status: DC
  Administered 2014-11-09 – 2014-11-10 (×2): 40 mg via ORAL
  Filled 2014-11-09 (×2): qty 1

## 2014-11-09 NOTE — Progress Notes (Signed)
Pt. walked 150  ft qwith walker. Mario [name] 9:27 PM

## 2014-11-09 NOTE — Progress Notes (Signed)
Attempted to place verbal order from [facility] for combivent inhaler.  Order kept being discontinued by pharmacy and placed back in as nebs.  Pharmacy later this evening sent a message on the medication list informing me that this medication is no longer formulary, as it could not be charged by the hospital correctly.  They also stated pt is not allowed to go home with inhaler.  I reported this information to the PM RN to pass along to day shift.

## 2014-11-09 NOTE — Progress Notes (Addendum)
      [address]       [place] [address]             [phone]        6 Days Post-Op Procedure(s) (LRB): CORONARY ARTERY BYPASS GRAFTING  times three using left internal mammary and right greater saphenous vein (N/A) TRANSESOPHAGEAL ECHOCARDIOGRAM (TEE) (N/A)  Subjective: Patient states he does not feel that great this am. Has cough-productive at times.  Objective: Vital signs in last 24 hours: Temp:  [97.5 F (36.4 C)-98.1 F (36.7 C)] 98.1 F (36.7 C) (06/19 0621) Pulse Rate:  [76-78] 76 (06/19 0621) Cardiac Rhythm:  [-] Normal sinus rhythm (06/18 2150) Resp:  [16-20] 20 (06/19 0621) BP: (130-135)/(59-67) 132/62 mmHg (06/19 0621) SpO2:  [97 %-99 %] 97 % (06/19 0621) Weight:  [167 lb 1.6 oz (75.796 kg)] 167 lb 1.6 oz (75.796 kg) (06/19 0621)  Pre op weight 75.8 kg Current Weight  11/09/14 167 lb 1.6 oz (75.796 kg)      Intake/Output from previous day: 06/18 0701 - 06/19 0700 In: 243 [P.O.:240; I.V.:3] Out: -    Physical Exam:  Cardiovascular: RRR Pulmonary: Coarse breath sounds bilaterally Abdomen: Soft, non tender, bowel sounds present. Extremities: trace bilateral lower extremity edema. Wounds: Clean and dry. No signs of infection.  Lab Results: CBC: No results for input(s): WBC, HGB, HCT, PLT in the last 72 hours. BMET:   Recent Labs  11/07/14 0310  NA 139  K 4.2  CL 107  CO2 24  GLUCOSE 129*  BUN 23*  CREATININE 1.27*  CALCIUM 8.1*    PT/INR:  Lab Results  Component Value Date   INR 1.51* 11/03/2014   INR 1.12 10/30/2014   INR 1.0 09/23/2014   ABG:  INR: Will add last result for INR, ABG once components are confirmed Will add last 4 CBG results once components are confirmed  Assessment/Plan:  1. CV - Afib with increased HR late yesterday. In SR with occ PVCs this am. No more a fib. SR with HR in the 80's. On Coreg 6.25 mg bid and Lisinopril 5 mg daily. Will increase Lisinopril to 10 mg daily for better BP  control. 2.  Pulmonary - History of COPD. On room air. On Levaquin for possible bronchitis and on Mucinex for cough. CXR this am appears stable (small left pleural effusion, no pneumothorax, and bibasilar atelectasis). As discussed with Dr. Prescott Gum, will start Combivent. Encourage incentive spirometer 3. Volume Overload - On Lasix 40 daily 4.  Acute blood loss anemia - H and H stable at 10.1 and 30.8. On daily ferrous sulfate and folic acid. 5. DM-CBGs  197/190/133. Pre op HGA1C 7.2. On Glipizide 10 mg at hs and Januvia 100 mg daily as taken pre op. Will restart Metformin at discharge. 6. Mild thrombocytopenia-platelets 112,000 7. Creatinine stable at 1.27 8. Hope to discharge in am  ZIMMERMAN,DONIELLE MPA-C 11/09/2014,7:47 AM   CXR images reviewed- prob bronchitis Add MDI therapy to Levaquin patient examined and medical record reviewed,agree with above note. Tharon Aquas Trigt III 11/09/2014

## 2014-11-09 NOTE — Progress Notes (Signed)
Pt refusing to walk this AM, as he stated he is feeling very tired.  Pt was given tramadol, as he has a weak cough he states is problematic due to pain with the cough.  Pt encouraged to walk and use IS.

## 2014-11-10 MED ORDER — CARVEDILOL 6.25 MG PO TABS: 6.2500 mg | ORAL_TABLET | Freq: Two times a day (BID) | ORAL | Status: DC

## 2014-11-10 MED ORDER — IPRATROPIUM-ALBUTEROL 0.5-2.5 (3) MG/3ML IN SOLN: 3.0000 mL | Freq: Two times a day (BID) | RESPIRATORY_TRACT | Status: DC

## 2014-11-10 MED ORDER — LEVOFLOXACIN 750 MG PO TABS: 750.0000 mg | ORAL_TABLET | Freq: Every day | ORAL | Status: DC

## 2014-11-10 NOTE — Progress Notes (Addendum)
      [address]       [place] [address]             [phone]        7 Days Post-Op Procedure(s) (LRB): CORONARY ARTERY BYPASS GRAFTING  times three using left internal mammary and right greater saphenous vein (N/A) TRANSESOPHAGEAL ECHOCARDIOGRAM (TEE) (N/A)  Subjective: Patient eating breakfast. He feels better and thinks inhaler is helping.  Objective: Vital signs in last 24 hours: Temp:  [97.5 F (36.4 C)-98 F (36.7 C)] 98 F (36.7 C) (06/20 0425) Pulse Rate:  [73-78] 78 (06/20 0425) Cardiac Rhythm:  [-] Normal sinus rhythm (06/19 1940) Resp:  [16] 16 (06/20 0425) BP: (110-139)/(54-70) 131/70 mmHg (06/20 0425) SpO2:  [96 %-99 %] 99 % (06/20 0425) Weight:  [164 lb 14.5 oz (74.8 kg)] 164 lb 14.5 oz (74.8 kg) (06/20 0425)  Pre op weight 75.8 kg Current Weight  11/10/14 164 lb 14.5 oz (74.8 kg)      Intake/Output from previous day: 06/19 0701 - 06/20 0700 In: 120 [P.O.:120] Out: 1000 [Urine:1000]   Physical Exam:  Cardiovascular: RRR Pulmonary: Sightly coarse but less so than in previous days. Abdomen: Soft, non tender, bowel sounds present. Extremities: trace bilateral lower extremity edema. Wounds: Clean and dry. No signs of infection.  Lab Results: CBC: No results for input(s): WBC, HGB, HCT, PLT in the last 72 hours. BMET:  No results for input(s): NA, K, CL, CO2, GLUCOSE, BUN, CREATININE, CALCIUM in the last 72 hours.  PT/INR:  Lab Results  Component Value Date   INR 1.51* 11/03/2014   INR 1.12 10/30/2014   INR 1.0 09/23/2014   ABG:  INR: Will add last result for INR, ABG once components are confirmed Will add last 4 CBG results once components are confirmed  Assessment/Plan:  1. CV - Afib with increased HR late yesterday. In SR with occ PVCs this am. No more a fib. SR with HR in the 70's. On Coreg 6.25 mg bid and Lisinopril 10 mg daily.  2.  Pulmonary - History of COPD. On room air. On Levaquin for possible bronchitis  and on Mucinex for cough.On Duoneb. Encourage incentive spirometer 3. Volume Overload - On Lasix 40 daily. Will not need after discharge. 4.  Acute blood loss anemia - H and H stable at 10.1 and 30.8. On daily ferrous sulfate and folic acid. 5. DM-CBGs  133/238/243. Pre op HGA1C 7.2. On Glipizide 10 mg at hs and Januvia 100 mg daily as taken pre op. Will restart Metformin at discharge. 6. Mild thrombocytopenia-platelets 112,000 7. Creatinine stable at 1.27 8. Dishcarge today  Jerrell Hart MPA-C 11/10/2014,7:27 AM

## 2014-11-10 NOTE — Progress Notes (Signed)
UR Completed. Mayley Lish, RN, BSN.  [phone] 

## 2014-11-10 NOTE — Progress Notes (Signed)
Discharge Med Rec Note: [place], [place]  The following medications were initiated, discontinued, or adjusted during Mr. Jellison stay for coronary artery bypass grafting (CABG x 3) on 11/03/2014.  Initiated: Lisinopril 10 mg PO daily. In the setting of CABG, concomitant diabetes mellitus and high blood pressures postoperatively, lisinopril was initiated to better control blood pressures and reduce the risk of adverse cardiac events and kidney dysfunction.   Levofloxacin 750 mg PO daily x 3 days. This medication was initiated on 6/17 in the setting of possible bronchitis.   Guaifenesin (Mucinex) 600 mg PO bid. This medication is to be used as needed for cough associated with bronchitis.  Acetaminophen (Tylenol) 500 mg PO daily. Mr. Clingan should use this medication for postoperative pain.  Adjusted: Aspirin 81 mg PO daily increased to 325 mg PO daily. In the setting of CABG, this medication was increased for a reduction in the risk of graft blockage.   Carvedilol 3.125 mg PO bid increased to 6.25 mg PO bid. In the setting of elevated blood pressures, this medication was increased for better control.   Discontinued: N/A   Meds were reconciled in Epic and patient was counseled on date of discharge.  Ruta Hinds. Velva Harman, PharmD, [place] Clinical Pharmacist - Resident Pager: [phone] Pharmacy: [phone] 11/10/2014 8:40 AM

## 2014-11-10 NOTE — Progress Notes (Signed)
AVS discharge instructions were reviewed with patient and his wife. Patient was given his prescriptions for medications to take to his pharmacy (levaquin,coreg,lisinopril, and albuterol). Patient stated that he did not have any questions. Volunteers called to assist patient to his transportation.

## 2014-11-10 NOTE — Care Management Note (Signed)
Case Management Note  Patient Details  Name: Mario [name] MRN: [id number] Date of Birth: April 11, 1941  Subjective/Objective:  Pt admitted with CAD            Action/Plan:  Pt is from home.  CM will monitor for disposition needs   Expected Discharge Date:                  Expected Discharge Plan:  Home/Self Care  In-House Referral:     Discharge planning Services  CM Consult  Post Acute Care Choice:    Choice offered to:     DME Arranged:    DME Agency:     HH Arranged:    [place] Agency:     Status of Service:  Completed, signed off  Medicare Important Message Given:  Yes Date Medicare IM Given:  11/06/14 Medicare IM give by:  Marvetta Gibbons RN, BSN  Date Additional Medicare IM Given:  11/10/14 Additional Medicare Important Message give by:  Elenor Quinones  If discussed at Long Length of Stay Meetings, dates discussed:    Disposition Plan:  Home with self care  Additional Comments:  Maryclare Labrador, RN 11/10/2014, 12:08 PM

## 2014-11-13 ENCOUNTER — Ambulatory Visit: Payer: Self-pay | Admitting: *Deleted

## 2014-11-13 DIAGNOSIS — I2510 Atherosclerotic heart disease of native coronary artery without angina pectoris: Secondary | ICD-10-CM

## 2014-11-13 DIAGNOSIS — Z951 Presence of aortocoronary bypass graft: Secondary | ICD-10-CM

## 2014-11-13 DIAGNOSIS — Z4802 Encounter for removal of sutures: Secondary | ICD-10-CM

## 2014-11-13 NOTE — Progress Notes (Signed)
Mario [name] returns after CABG X 3 on 11/03/14.  His sternal incision, chest tube sites and right and left left leg EVH sites are al healing very well.  I removed the sutures from his previous chest tube incisions without difficulty.  There is no lower extremity edema. He is using the nebulizer prescribed on discharge. He bought it at a discount medical supply store when I faxed a prescription to them. He plans a trip to [facility]. the end of next week for a family member's memorial.  Dr. [name] is aware. They are going to make it a two day trip due to his recent surgery which I think is a good idea.  He will return as scheduled with a cxr.

## 2014-11-18 ENCOUNTER — Other Ambulatory Visit: Payer: Self-pay

## 2014-11-18 MED ORDER — MIGLITOL 50 MG PO TABS: 50.0000 mg | ORAL_TABLET | Freq: Two times a day (BID) | ORAL | Status: DC

## 2014-11-21 ENCOUNTER — Encounter: Payer: Commercial Managed Care - HMO | Admitting: Nurse Practitioner

## 2014-11-26 ENCOUNTER — Other Ambulatory Visit (INDEPENDENT_AMBULATORY_CARE_PROVIDER_SITE_OTHER): Payer: Commercial Managed Care - HMO

## 2014-11-26 DIAGNOSIS — E1165 Type 2 diabetes mellitus with hyperglycemia: Secondary | ICD-10-CM

## 2014-11-26 DIAGNOSIS — IMO0002 Reserved for concepts with insufficient information to code with codable children: Secondary | ICD-10-CM

## 2014-11-26 LAB — HEMOGLOBIN A1C: Hgb A1c MFr Bld: 7.3 % — ABNORMAL HIGH (ref 4.6–6.5)

## 2014-11-26 LAB — COMPREHENSIVE METABOLIC PANEL
ALT: 55 U/L — ABNORMAL HIGH (ref 0–53)
AST: 40 U/L — ABNORMAL HIGH (ref 0–37)
Albumin: 3.1 g/dL — ABNORMAL LOW (ref 3.5–5.2)
Alkaline Phosphatase: 76 U/L (ref 39–117)
BUN: 20 mg/dL (ref 6–23)
CO2: 24 mEq/L (ref 19–32)
Calcium: 8.8 mg/dL (ref 8.4–10.5)
Chloride: 110 mEq/L (ref 96–112)
Creatinine, Ser: 1.05 mg/dL (ref 0.40–1.50)
GFR: 73.46 mL/min (ref >60.00)
Glucose, Bld: 121 mg/dL — ABNORMAL HIGH (ref 70–99)
Potassium: 4.8 mEq/L (ref 3.5–5.1)
Sodium: 139 mEq/L (ref 135–145)
Total Bilirubin: 0.6 mg/dL (ref 0.2–1.2)
Total Protein: 5.8 g/dL — ABNORMAL LOW (ref 6.0–8.3)

## 2014-12-01 ENCOUNTER — Encounter: Payer: Self-pay | Admitting: *Deleted

## 2014-12-01 ENCOUNTER — Encounter: Payer: Self-pay | Admitting: Endocrinology

## 2014-12-01 ENCOUNTER — Ambulatory Visit (INDEPENDENT_AMBULATORY_CARE_PROVIDER_SITE_OTHER): Payer: Commercial Managed Care - HMO | Admitting: Endocrinology

## 2014-12-01 ENCOUNTER — Other Ambulatory Visit: Payer: Self-pay | Admitting: *Deleted

## 2014-12-01 VITALS — BP 110/62 | HR 80 | Temp 97.8°F | Resp 14 | Ht 74.0 in | Wt 160.4 lb

## 2014-12-01 DIAGNOSIS — E785 Hyperlipidemia, unspecified: Secondary | ICD-10-CM

## 2014-12-01 DIAGNOSIS — IMO0002 Reserved for concepts with insufficient information to code with codable children: Secondary | ICD-10-CM

## 2014-12-01 DIAGNOSIS — E1165 Type 2 diabetes mellitus with hyperglycemia: Secondary | ICD-10-CM

## 2014-12-01 MED ORDER — ATORVASTATIN CALCIUM 40 MG PO TABS: 40.0000 mg | ORAL_TABLET | Freq: Every day | ORAL | Status: DC

## 2014-12-01 MED ORDER — GLIPIZIDE ER 10 MG PO TB24: 10.0000 mg | ORAL_TABLET | Freq: Every day | ORAL | Status: DC

## 2014-12-01 MED ORDER — ALLOPURINOL 100 MG PO TABS: 100.0000 mg | ORAL_TABLET | Freq: Every day | ORAL | Status: DC

## 2014-12-01 NOTE — Patient Instructions (Addendum)
4 Cycloset daily  Check blood sugars on waking up .Marland Kitchen2-3  .Marland Kitchen times a week Also check blood sugars about 2 hours after a meal and do this after different meals by rotation  Recommended blood sugar levels on waking up is 90-130 and about 2 hours after meal is 140-180 Please bring blood sugar monitor to each visit.  Reduce Metfomin to 1/2 pill at dinner

## 2014-12-01 NOTE — Progress Notes (Signed)
Patient ID: Mario [name], male   DOB: Jan 14, 1941, 74 y.o.   MRN: [id number]   Reason for Appointment: Diabetes follow-up   History of Present Illness   Diagnosis: Type 2 DIABETES MELITUS, date of diagnosis:  1989     Previous history: He was initially diagnosed when hospitalized for pancreatitis and his previous endocrinologist had treated him with multiple medications because of progression of his diabetes. He was also put on Cycloset  which he has tolerated maximum dose He has had adjustments of his medication dosages the last couple of years and Januvia was restarted in 5/14 when blood sugars were higher. Dosages have been adjusted based on renal function Previously an A1c levels have been ranging from 6.6-7.5 His metformin was increased  when renal function was better and glipizide was changed to glipizide ER 10 mg. Did not appear to have better blood sugar control with Invokana  Recent history:  He has lost a significant amount of weight since his last visit because of having coronary artery bypass surgery His A1c is about the same and has been better more recently He was asked to increase his Cycloset to 6 tablets a day in the morning on the last visit again but he is only taking 4, recently only started taking 5 tablets since he had not taken metformin for about a week  Blood sugar patterns: FASTING blood sugars are fairly consistently near normal but has checked only a few readings recently, range 108-141, overall average 124 He has only a couple of readings later in the day and these are 125 each  He is fairly compliant with taking his multidrug regimen Occasionally may forget to take Glyset at the start of the meal Still on maximum dose metformin and Januvia since renal function has been stable in the normal range  Oral hypoglycemic drugs: Januvia, Glyset, glipizide er 10 , Metformin 1 g twice a day, Cycloset        Side effects from medications: None Proper timing  of medications in relation to meals: usually  Monitors blood glucose: Once a day.    Glucometer:   Accu-Chek         Blood Glucose readings from meter download as above:  Hypoglycemia: None recently      Meals: 2-3 meals per day, no lunch often; breakfast is cereal, [place] bacon, sometimes bread and juice at breakfast     Dietician 3/16       Physical activity: exercise:  Was going to the gym or tennis 3-4/7, none recently.           Wt Readings from Last 3 Encounters:  12/01/14 160 lb 6.4 oz (72.757 kg)  11/10/14 164 lb 14.5 oz (74.8 kg)  10/30/14 167 lb 3.2 oz (75.841 kg)    Lab Results  Component Value Date   HGBA1C 7.3* 11/26/2014   HGBA1C 7.2* 10/30/2014   HGBA1C 7.8* 08/28/2014   Lab Results  Component Value Date   MICROALBUR 27.2* 05/29/2014   LDLCALC 52 05/29/2014   CREATININE 1.05 11/26/2014     LABS:  Lab on 11/26/2014  Component Date Value Ref Range Status  . Hgb A1c MFr Bld 11/26/2014 7.3* 4.6 - 6.5 % Final   Glycemic Control Guidelines for People with Diabetes:Non Diabetic:  <6%Goal of Therapy: <7%Additional Action Suggested:  >8%   . Sodium 11/26/2014 139  135 - 145 mEq/L Final  . Potassium 11/26/2014 4.8  3.5 - 5.1 mEq/L Final  . Chloride 11/26/2014 110  96 - 112 mEq/L Final  . CO2 11/26/2014 24  19 - 32 mEq/L Final  . Glucose, Bld 11/26/2014 121* 70 - 99 mg/dL Final  . BUN 11/26/2014 20  6 - 23 mg/dL Final  . Creatinine, Ser 11/26/2014 1.05  0.40 - 1.50 mg/dL Final  . Total Bilirubin 11/26/2014 0.6  0.2 - 1.2 mg/dL Final  . Alkaline Phosphatase 11/26/2014 76  39 - 117 U/L Final  . AST 11/26/2014 40* 0 - 37 U/L Final  . ALT 11/26/2014 55* 0 - 53 U/L Final  . Total Protein 11/26/2014 5.8* 6.0 - 8.3 g/dL Final  . Albumin 11/26/2014 3.1* 3.5 - 5.2 g/dL Final  . Calcium 11/26/2014 8.8  8.4 - 10.5 mg/dL Final  . GFR 11/26/2014 73.46  >60.00 mL/min Final      Medication List       This list is accurate as of: 12/01/14  1:24 PM.  Always use your most  recent med list.               ACCU-CHEK NANO SMARTVIEW W/DEVICE Kit  Use to check blood sugars 2 times per day dx code 250.02     ACCU-CHEK SMARTVIEW CONTROL Liqd  Use as directed     acetaminophen 500 MG tablet  Commonly known as:  TYLENOL  Take 500 mg by mouth daily.     allopurinol 100 MG tablet  Commonly known as:  ZYLOPRIM  Take 1 tablet (100 mg total) by mouth daily.     aspirin 325 MG EC tablet  Take 1 tablet (325 mg total) by mouth daily.     atorvastatin 40 MG tablet  Commonly known as:  LIPITOR  Take 1 tablet (40 mg total) by mouth daily at 6 PM.     Bromocriptine Mesylate 0.8 MG Tabs  Take 4 mg by mouth daily.     carvedilol 6.25 MG tablet  Commonly known as:  COREG  Take 1 tablet (6.25 mg total) by mouth 2 (two) times daily.     cholecalciferol 1000 UNITS tablet  Commonly known as:  VITAMIN D  Take 2,000 Units by mouth daily.     Co-Enzyme Q-10 100 MG Caps  Take 100 mg by mouth daily.     ferrous sulfate 325 (65 FE) MG tablet  Take 325 mg by mouth daily.     fluorouracil 5 % cream  Commonly known as:  EFUDEX  Apply 1 application topically daily as needed (skin irritations).     folic acid 270 MCG tablet  Commonly known as:  FOLVITE  Take 800 mcg by mouth daily.     glipiZIDE 10 MG 24 hr tablet  Commonly known as:  GLUCOTROL XL  Take 1 tablet (10 mg total) by mouth at bedtime.     glucose blood test strip  Commonly known as:  ACCU-CHEK SMARTVIEW  Use as instructed to check blood sugar 2 times per day dx code E11.65     ipratropium-albuterol 0.5-2.5 (3) MG/3ML Soln  Commonly known as:  DUONEB  Take 3 mLs by nebulization 2 (two) times daily.     levofloxacin 750 MG tablet  Commonly known as:  LEVAQUIN  Take 1 tablet (750 mg total) by mouth daily at 6 PM. For 3 days then stop.     lisinopril 10 MG tablet  Commonly known as:  PRINIVIL,ZESTRIL  Take 1 tablet (10 mg total) by mouth daily.     metFORMIN 1000 MG tablet  Commonly known as:  GLUCOPHAGE  Take 1 tablet (1,000 mg total) by mouth 2 (two) times daily with a meal.     MICROLET LANCETS Misc  USE ONE LANCET TO CHECK GLUCOSE TWICE DAILY AS DIRECTED DX CODE E11.65     miglitol 50 MG tablet  Commonly known as:  GLYSET  Take 1 tablet (50 mg total) by mouth 2 (two) times daily.     mupirocin ointment 2 %  Commonly known as:  BACTROBAN  APPLY TO EACH NOSTRIL TWICE A DAY FOR 5 DAYS     NITROSTAT 0.4 MG SL tablet  Generic drug:  nitroGLYCERIN  PLACE 1 TABLET (0.4 MG TOTAL) UNDER THE TONGUE EVERY 5 (FIVE) MINUTES AS NEEDED.     omega-3 acid ethyl esters 1 G capsule  Commonly known as:  LOVAZA  Take 1 g by mouth daily.     PRESERVISION AREDS 2 Caps  Take 2 capsules by mouth daily.     sitaGLIPtin 100 MG tablet  Commonly known as:  JANUVIA  Take 1 tablet (100 mg total) by mouth daily.     tamsulosin 0.4 MG Caps capsule  Commonly known as:  FLOMAX  Take 1 capsule (0.4 mg total) by mouth daily.     vitamin B-12 500 MCG tablet  Commonly known as:  CYANOCOBALAMIN  Take 1,000 mcg by mouth daily.        Allergies:  Allergies  Allergen Reactions  . Penicillins Anaphylaxis  . Plavix [Clopidogrel Bisulfate] Hives  . Pletal [Cilostazol] Itching    Past Medical History  Diagnosis Date  . Essential hypertension, benign   . Other and unspecified hyperlipidemia   . PAD (peripheral artery disease)   . Orthostasis   . CAD (coronary artery disease)     with IMI and BMS 1997  . Gout   . Pneumonia     hx of  . Chronic kidney disease, stage II (mild)   . Arthritis   . Type II or unspecified type diabetes mellitus without mention of complication, uncontrolled     Past Surgical History  Procedure Laterality Date  . Hernia repair    . Cholecystectomy    . Cataract extraction    . Melanoma excision    . Skin lesion excision      multiple  . Abdominal aortagram N/A 10/09/2013    Procedure: ABDOMINAL Maxcine Ham;  Surgeon: Wellington Hampshire, MD;  [facility]  CATH LAB;  Service: Cardiovascular;  Laterality: N/A;  . Cardiac catheterization N/A 10/02/2014    Procedure: Left Heart Cath and Coronary Angiography;  Surgeon: Belva Crome, MD;  Location: [place] CV LAB;  Service: Cardiovascular;  Laterality: N/A;  . Colonoscopy    . Coronary artery bypass graft N/A 11/03/2014    Procedure: CORONARY ARTERY BYPASS GRAFTING  times three using left internal mammary and right greater saphenous vein;  Surgeon: Gaye Pollack, MD;  Location: [place] [facility] OR;  Service: Open Heart Surgery;  Laterality: N/A;  . Tee without cardioversion N/A 11/03/2014    Procedure: TRANSESOPHAGEAL ECHOCARDIOGRAM (TEE);  Surgeon: Gaye Pollack, MD;  Location: [facility];  Service: Open Heart Surgery;  Laterality: N/A;    Family History  Problem Relation Age of Onset  . Colon cancer Father   . Heart disease Mother     Social History:  reports that he has been smoking Cigarettes.  He has a 25 pack-year smoking history. He has never used smokeless tobacco. He reports that he does not drink alcohol or use illicit drugs.  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  HDL is however persistently low Not on any fenofibrate currently and triglycerides are normal   Lab Results  Component Value Date   CHOL 116 05/29/2014   HDL 36.70* 05/29/2014   LDLCALC 52 05/29/2014   TRIG 136.0 05/29/2014   CHOLHDL 3 05/29/2014   No history of chest pain on exertion, is going to get a stress test because of some history of dyspnea on exertion, however he is still able to exercise  Foot exam done in 7/16, has callus formation on the left and significant sensory  loss as well as decreased pulses  History of BPH present, taking Flomax   Examination:   BP 110/62 mmHg  Pulse 80  Temp(Src) 97.8 F (36.6 C)  Resp 14  Ht _0  (1.88 m)  Wt 160 lb 6.4 oz (72.757 kg)  BMI 20.59 kg/m2  SpO2 98%  Body mass index is 20.59 kg/(m^2).   Practically absent monofilament sensation on the right distal toes and significantly decreased on the left Pedal pulses not palpable  ASSESSMENT/ PLAN:   Diabetes type 2   Blood glucose control is consistently good with A1c just over 7% now See history of present illness for detailed discussion of his current management, blood sugar patterns and problems identified More recently has lost weight and has not been able to exercise because of his recent bypass surgery  Since his liver functions are mildly abnormal will reduce his metformin to 1500 mg He can go down to 4 tablets of Cycloset as he is concerned about the cost Start monitoring more regularly after meals rather than the morning  He does have evidence of neuropathic sensory loss on exam  ?  Hypertension: He tends to run low normal blood pressures and he will discuss his medications with cardiologist on his follow-up  Liver function abnormality: He will follow-up with his PCP, appears not to have any new drugs that would cause hepatic dysfunction   Patient Instructions  4 Cycloset daily  Check blood sugars on waking up .Marland Kitchen2-3  .Marland Kitchen times a week Also check blood sugars about 2 hours after a meal and do this after different meals by rotation  Recommended blood sugar levels on waking up is 90-130 and about 2 hours after meal is 140-180 Please bring blood sugar monitor to each visit.  Reduce Metfomin to 1/2 pill at dinner    Counseling time over 50% of today's 25 minute visit  Arneda Sappington 12/01/2014, 1:24 PM

## 2014-12-05 ENCOUNTER — Other Ambulatory Visit: Payer: Self-pay | Admitting: Surgery

## 2014-12-05 DIAGNOSIS — Z951 Presence of aortocoronary bypass graft: Secondary | ICD-10-CM

## 2014-12-08 ENCOUNTER — Ambulatory Visit (INDEPENDENT_AMBULATORY_CARE_PROVIDER_SITE_OTHER): Payer: Self-pay | Admitting: Surgical

## 2014-12-08 ENCOUNTER — Ambulatory Visit
Admission: RE | Admit: 2014-12-08 | Discharge: 2014-12-08 | Disposition: A | Discharge status: Home / Self Care | Payer: Commercial Managed Care - HMO | Source: Ambulatory Visit | Attending: Surgery | Admitting: Surgery

## 2014-12-08 VITALS — BP 111/65 | HR 82 | Resp 16 | Ht 74.0 in | Wt 159.5 lb

## 2014-12-08 DIAGNOSIS — I2510 Atherosclerotic heart disease of native coronary artery without angina pectoris: Secondary | ICD-10-CM

## 2014-12-08 DIAGNOSIS — Z951 Presence of aortocoronary bypass graft: Secondary | ICD-10-CM

## 2014-12-08 NOTE — Patient Instructions (Signed)
Patient given verbal instructions regarding to ongoing activity progression.

## 2014-12-08 NOTE — Progress Notes (Signed)
[address]       [place] [address]             [id number]                  Linton Bogacki [facility] Medical Record #[id number] Date of Birth: [birth year]  Referring DJ:SHFWY, Lynnell Dike, MD Primary Cardiology: Primary Care:POLITE,RONALD D, MD  Chief Complaint:  Follow Up Visit   CARDIOVASCULAR SURGERY OPERATIVE NOTE  11/03/2014  Surgeon: Gaye Pollack, MD  First Assistant: Lars Pinks, PA-C   Preoperative Diagnosis: Severe multi-vessel coronary artery disease   Postoperative Diagnosis: Same   Procedure:  1. Median Sternotomy 2. Extracorporeal circulation 3. Coronary artery bypass grafting x 3   Free left internal mammary graft to the LAD  SVG to OM  SVG to distal RCA  4. Endoscopic vein harvest from the right leg   Anesthesia: General Endotracheal  History of Present Illness:    The patient is a 74 year old male status post the above procedure seen in the office on today's date for routine postoperative follow-up. He is overall doing quite well. He states he has quit smoking. He denies fevers, chills or other constitutional symptoms. He has a mildly productive cough which is improving over time. He is a chronic inhaler. He has had no difficulties with his incisions. He has started driving and has not had any difficulties. He is ambulating without significant difficulty. He has had no significant chest pain or palpitations.         Zubrod Score: At the time of surgery this patient's most appropriate activity status/level should be described as: $RemoveBefor'[x]'nvJOSyedJqWX$     0    Normal activity, no symptoms $RemoveBef'[]'ocHRZNiOeu$     1    Restricted in physical strenuous activity but ambulatory, able to do out light work $RemoveBe'[]'huiorQUIb$     2    Ambulatory and capable of self care, unable to do work activities, up and about                 >50 % of waking hours                                                                                    '[]'$     3    Only limited self care, in bed greater than 50% of waking hours $RemoveBefo'[]'cLZthjkduMx$     4    Completely disabled, no self care, confined to bed or chair $Remove'[]'HNXoIjT$     5    Moribund  History  Smoking status  . Current Every Day Smoker -- 0.50 packs/day for 50 years  . Types: Cigarettes  Smokeless tobacco  . Never Used       Allergies  Allergen Reactions  . Penicillins Anaphylaxis  . Plavix [Clopidogrel Bisulfate] Hives  . Pletal [Cilostazol] Itching    Current Outpatient Prescriptions  Medication Sig Dispense Refill  . acetaminophen (TYLENOL) 500 MG tablet Take 500 mg by mouth daily.     Marland Kitchen allopurinol (ZYLOPRIM) 100 MG tablet Take 1 tablet (100 mg total) by mouth daily. 90 tablet 1  . aspirin EC 325 MG EC tablet Take 1  tablet (325 mg total) by mouth daily. 30 tablet 0  . atorvastatin (LIPITOR) 40 MG tablet Take 1 tablet (40 mg total) by mouth daily at 6 PM. 90 tablet 1  . Blood Glucose Calibration (ACCU-CHEK SMARTVIEW CONTROL) LIQD Use as directed 1 each 1  . Blood Glucose Monitoring Suppl (ACCU-CHEK NANO SMARTVIEW) W/DEVICE KIT Use to check blood sugars 2 times per day dx code 250.02 1 kit 1  . Bromocriptine Mesylate 0.8 MG TABS Take 4 mg by mouth daily.     . carvedilol (COREG) 6.25 MG tablet Take 1 tablet (6.25 mg total) by mouth 2 (two) times daily. 60 tablet 1  . cholecalciferol (VITAMIN D) 1000 UNITS tablet Take 2,000 Units by mouth daily.    Marland Kitchen Co-Enzyme Q-10 100 MG CAPS Take 100 mg by mouth daily.    . ferrous sulfate 325 (65 FE) MG tablet Take 325 mg by mouth daily.     . fluorouracil (EFUDEX) 5 % cream Apply 1 application topically daily as needed (skin irritations).     . folic acid (FOLVITE) 564 MCG tablet Take 800 mcg by mouth daily.    Marland Kitchen glipiZIDE (GLUCOTROL XL) 10 MG 24 hr tablet Take 1 tablet (10 mg total) by mouth at bedtime. 90 tablet 1  . glucose blood (ACCU-CHEK SMARTVIEW) test strip Use as instructed to check  blood sugar 2 times per day dx code E11.65 200 each 1  . ipratropium-albuterol (DUONEB) 0.5-2.5 (3) MG/3ML SOLN Take 3 mLs by nebulization 2 (two) times daily. 360 mL 0  . lisinopril (PRINIVIL,ZESTRIL) 10 MG tablet Take 1 tablet (10 mg total) by mouth daily. 30 tablet 1  . metFORMIN (GLUCOPHAGE) 1000 MG tablet Take 1 tablet (1,000 mg total) by mouth 2 (two) times daily with a meal. 180 tablet 3  . MICROLET LANCETS MISC USE ONE LANCET TO CHECK GLUCOSE TWICE DAILY AS DIRECTED DX CODE E11.65 100 each 3  . miglitol (GLYSET) 50 MG tablet Take 1 tablet (50 mg total) by mouth 2 (two) times daily. 90 tablet 1  . Multiple Vitamins-Minerals (PRESERVISION AREDS 2) CAPS Take 2 capsules by mouth daily.    . mupirocin ointment (BACTROBAN) 2 % APPLY TO EACH NOSTRIL TWICE A DAY FOR 5 DAYS  0  . NITROSTAT 0.4 MG SL tablet PLACE 1 TABLET (0.4 MG TOTAL) UNDER THE TONGUE EVERY 5 (FIVE) MINUTES AS NEEDED.  3  . omega-3 acid ethyl esters (LOVAZA) 1 G capsule Take 1 g by mouth daily.    . sitaGLIPtin (JANUVIA) 100 MG tablet Take 1 tablet (100 mg total) by mouth daily. 30 tablet 5  . tamsulosin (FLOMAX) 0.4 MG CAPS capsule Take 1 capsule (0.4 mg total) by mouth daily. 30 capsule 0  . vitamin B-12 (CYANOCOBALAMIN) 500 MCG tablet Take 1,000 mcg by mouth daily.     No current facility-administered medications for this visit.       Physical Exam: BP 111/65 mmHg  Pulse 82  Resp 16  Ht 6' 2" (1.88 m)  Wt 159 lb 8 oz (72.349 kg)  BMI 20.47 kg/m2  SpO2 98%  General appearance: alert, cooperative and no distress Heart: regular rate and rhythm Lungs: clear to auscultation bilaterally Abdomen: soft, non-tender; bowel sounds normal; no masses,  no organomegaly Extremities: extremities normal, atraumatic, no cyanosis or edema Wound: Healing well without evidence of infection Wounds:  Diagnostic Studies & Laboratory data:         Recent Radiology Findings: Dg Chest 2 View  12/08/2014  CLINICAL DATA:  History of  CABG on November 03, 2014, follow-up study.  EXAM: CHEST  2 VIEW  COMPARISON:  PA and lateral chest x-ray of November 09, 2014  FINDINGS: The lungs are well-expanded. There is stable minimal interstitial prominence bilaterally. The pleural effusions have nearly totally resolved. The heart and pulmonary vascularity are normal. There are 7 intact sternal wires. The mediastinum is normal in width. There is no pulmonary vascular congestion. The bony thorax exhibits no acute abnormality.  IMPRESSION: Near total resolution of small bilateral pleural effusions. There is no pulmonary edema nor other acute cardiopulmonary abnormality.   Electronically Signed   By: David  Martinique M.D.   On: 12/08/2014 13:45      I have independently reviewed the above radiology findings and reviewed findings  with the patient.  Recent Labs: Lab Results  Component Value Date   WBC 10.6* 11/05/2014   HGB 10.1* 11/05/2014   HCT 30.8* 11/05/2014   PLT 112* 11/05/2014   GLUCOSE 121* 11/26/2014   CHOL 116 05/29/2014   TRIG 136.0 05/29/2014   HDL 36.70* 05/29/2014   LDLCALC 52 05/29/2014   ALT 55* 11/26/2014   AST 40* 11/26/2014   NA 139 11/26/2014   K 4.8 11/26/2014   CL 110 11/26/2014   CREATININE 1.05 11/26/2014   BUN 20 11/26/2014   CO2 24 11/26/2014   INR 1.51* 11/03/2014   HGBA1C 7.3* 11/26/2014      Assessment / Plan:  The patient is doing quite well status post coronary artery bypass grafting. There are no specific surgically related issues at this time. He is scheduled to see cardiology soon. He is scheduled to start cardiac rehabilitation soon. He wishes to see Dr. [name] one time and we will schedule for 1 month.        Grover Woodfield E 12/08/2014 2:20 PM

## 2014-12-10 ENCOUNTER — Encounter: Payer: Commercial Managed Care - HMO | Admitting: Surgery

## 2014-12-19 ENCOUNTER — Telehealth: Payer: Self-pay | Admitting: Physician Assistant

## 2014-12-19 NOTE — Telephone Encounter (Signed)
ROI faxed to Dr.Philip Gelber records received back placed in chart prep box patient appt with Richardson Dopp 12/25/14/KM

## 2014-12-22 ENCOUNTER — Other Ambulatory Visit: Payer: Self-pay | Admitting: *Deleted

## 2014-12-22 ENCOUNTER — Other Ambulatory Visit: Payer: Self-pay

## 2014-12-22 MED ORDER — MIGLITOL 50 MG PO TABS: 50.0000 mg | ORAL_TABLET | Freq: Two times a day (BID) | ORAL | Status: DC

## 2014-12-23 ENCOUNTER — Telehealth: Payer: Self-pay | Admitting: Interventional Cardiology

## 2014-12-23 NOTE — Telephone Encounter (Signed)
Additional records received from [facility] mail placed in chart prep bin.

## 2014-12-24 NOTE — Progress Notes (Signed)
Cardiology Office Note   Date:  12/25/2014   ID:  Mario [name], DOB Dec 02, 1940, MRN [id number]  PCP:  Kandice Hams, MD  Cardiologist:  Dr. [name]   Electrophysiologist:  n/a  Chief Complaint  Patient presents with  . Hospitalization Follow-up    s/p CABG  . Coronary Artery Disease     History of Present Illness: Mario [name] is a 74 y.o. male with a hx of CAD, status post BMS to the RCA in 1997 in the setting of inferior MI, diabetes, CKD, PAD.  He recently underwent nuclear stress test to evaluate dyspnea on exertion. This was a high risk study with reduced LV function with large scar in the RCA territory with minimal peri-infarct ischemia EF 34%. Cardiac catheterization was arranged. This demonstrated severe 3 vessel CAD and he was referred for CABG.  Admitted 6/13-6/20 and underwent CABG with a free LIMA-LAD, SVG-OM, SVG-distal RCA by Dr. [name].Postoperative course was complicated by AF with RVR. He converted to NSR with increased doses of beta blocker.  He saw Jadene Pierini, PA-C with Dr. [name] 7/18.  Post op CXR was stable.  He returns for follow-up. He is here alone. He is doing well. He notes that his chest soreness is improved. He did have some right shoulder pain and is seeing physical therapy. This is improving. He denies significant dyspnea. He is eager to get into cardiac rehabilitation and increase his activity. He denies orthopnea, PND or edema. He denies syncope or near-syncope. He has been dizzy at times. He denies fevers or cough.   Studies/Reports Reviewed Today:  Intraoperative TEE 11/03/14 EF 45-50%, LVH  Carotid US 10/2014 Bilateral ICA 1-39%  LHC 10/02/14 LAD:  Mid to dist 80%; ostial D2 70% LCx:  prox 85%, ost OM1 65% RCA:  prox to mid stent ok 50% ISR, mid 90% EF inf HK 40-45%  Severe coronary, mitral annular, aortic arch, subclavian, left common carotid, and right innominate artery calcification easily seen with fluoroscopy.  Unable to  perform the procedure from the right radial approach due to heavy calcification within subclavian and innominate artery preventing advancement of the diagnostic catheter into the ascending aorta.  Severe mid RCA 90% stenosis. Severe proximal circumflex and first obtuse marginal 85% stenosis. Moderately severe mid LAD/ diagonal 80/70% stenosis respectively. The distal right coronary fills late by collaterals from the left coronary system.  Left ventricular dysfunction with basal to mid inferior wall akinesis. EF 40%. RECOMMENDATIONS:   TCTS evaluation for possible bypass grafting  Depending upon the surgical opinion, further management may be with medical therapy versus complicated PCI procedures that would include rotational atherectomy in the heavy calcification noted throughout the patient's arterial tree.  Myoview 08/2014 Overall Impression: High risk stress nuclear study with a large scar in the RCA territory (5 segments) with minimal periinfarct ischemia in the mid inferoseptal and apical inferior walls (SDS 2). LV Ejection Fraction: 34%. LV Wall Motion: Moderately decreased LV systolic function with akinesis in the basal and mid inferolateral and inferior walls and in the basal inferoseptal wall.    Past Medical History  Diagnosis Date  . Essential hypertension, benign   . HLD (hyperlipidemia)   . PAD (peripheral artery disease)     Dr. Fletcher Anon  . Orthostasis   . CAD (coronary artery disease)     a.  s/p IMI and BMS 1997;  b. Myoview 4/16:  inf scar with peri-infarct ischemia, EF 34%; high risk;  c. LHC 5/16:  3 v CAD >>  CABG (free L-LAD, S-OM, S-dRCA)  . Gout   . Pneumonia     hx of  . Chronic kidney disease, stage II (mild)   . Arthritis   . Type II or unspecified type diabetes mellitus without mention of complication, uncontrolled   . Ischemic cardiomyopathy     a. EF by myoview 4/16 34%; b. LHC 5/16: EF 40-45%;  c. intraop TEE 6/16: EF 45-50%  . Chronic systolic CHF  (congestive heart failure)   . Carotid stenosis     a. carotid US 6/16:  bilat ICA 1-39%    Past Surgical History  Procedure Laterality Date  . Hernia repair    . Cholecystectomy    . Cataract extraction    . Melanoma excision    . Skin lesion excision      multiple  . Abdominal aortagram N/A 10/09/2013    Procedure: ABDOMINAL Maxcine Ham;  Surgeon: Wellington Hampshire, MD;  Location: [facility] CATH LAB;  Service: Cardiovascular;  Laterality: N/A;  . Cardiac catheterization N/A 10/02/2014    Procedure: Left Heart Cath and Coronary Angiography;  Surgeon: Belva Crome, MD;  Location: [facility] CV LAB;  Service: Cardiovascular;  Laterality: N/A;  . Colonoscopy    . Coronary artery bypass graft N/A 11/03/2014    Procedure: CORONARY ARTERY BYPASS GRAFTING  times three using left internal mammary and right greater saphenous vein;  Surgeon: Gaye Pollack, MD;  Location: [facility] OR;  Service: Open Heart Surgery;  Laterality: N/A;  . Tee without cardioversion N/A 11/03/2014    Procedure: TRANSESOPHAGEAL ECHOCARDIOGRAM (TEE);  Surgeon: Gaye Pollack, MD;  Location: [facility];  Service: Open Heart Surgery;  Laterality: N/A;     Current Outpatient Prescriptions  Medication Sig Dispense Refill  . acetaminophen (TYLENOL) 500 MG tablet Take 500 mg by mouth daily.     Marland Kitchen allopurinol (ZYLOPRIM) 100 MG tablet Take 1 tablet (100 mg total) by mouth daily. 90 tablet 1  . aspirin EC 325 MG EC tablet Take 1 tablet (325 mg total) by mouth daily. 30 tablet 0  . atorvastatin (LIPITOR) 40 MG tablet Take 1 tablet (40 mg total) by mouth daily at 6 PM. 90 tablet 1  . Blood Glucose Calibration (ACCU-CHEK SMARTVIEW CONTROL) LIQD Use as directed 1 each 1  . Blood Glucose Monitoring Suppl (ACCU-CHEK NANO SMARTVIEW) W/DEVICE KIT Use to check blood sugars 2 times per day dx code 250.02 1 kit 1  . Bromocriptine Mesylate 0.8 MG TABS Take 4 mg by mouth daily.     . carvedilol (COREG) 6.25 MG tablet Take 1 tablet (6.25 mg total) by mouth 2  (two) times daily. 60 tablet 1  . cholecalciferol (VITAMIN D) 1000 UNITS tablet Take 2,000 Units by mouth daily.    Marland Kitchen Co-Enzyme Q-10 100 MG CAPS Take 100 mg by mouth daily.    . ferrous sulfate 325 (65 FE) MG tablet Take 325 mg by mouth daily.     . fluorouracil (EFUDEX) 5 % cream Apply 1 application topically daily as needed (skin irritations).     . folic acid (FOLVITE) 389 MCG tablet Take 800 mcg by mouth daily.    Marland Kitchen glipiZIDE (GLUCOTROL XL) 10 MG 24 hr tablet Take 1 tablet (10 mg total) by mouth at bedtime. 90 tablet 1  . glucose blood (ACCU-CHEK SMARTVIEW) test strip Use as instructed to check blood sugar 2 times per day dx code E11.65 200 each 1  . ipratropium-albuterol (DUONEB) 0.5-2.5 (3) MG/3ML SOLN Take 3 mLs  by nebulization 2 (two) times daily. 360 mL 0  . lisinopril (PRINIVIL,ZESTRIL) 5 MG tablet Take 1 tablet (5 mg total) by mouth daily. 30 tablet 11  . metFORMIN (GLUCOPHAGE) 1000 MG tablet Take 1 tablet (1,000 mg total) by mouth 2 (two) times daily with a meal. 180 tablet 3  . MICROLET LANCETS MISC USE ONE LANCET TO CHECK GLUCOSE TWICE DAILY AS DIRECTED DX CODE E11.65 100 each 3  . miglitol (GLYSET) 50 MG tablet Take 1 tablet (50 mg total) by mouth 2 (two) times daily. 180 tablet 1  . Multiple Vitamins-Minerals (PRESERVISION AREDS 2) CAPS Take 2 capsules by mouth daily.    Marland Kitchen NITROSTAT 0.4 MG SL tablet PLACE 1 TABLET (0.4 MG TOTAL) UNDER THE TONGUE EVERY 5 (FIVE) MINUTES AS NEEDED.  3  . omega-3 acid ethyl esters (LOVAZA) 1 G capsule Take 1 g by mouth daily.    . sitaGLIPtin (JANUVIA) 100 MG tablet Take 1 tablet (100 mg total) by mouth daily. 30 tablet 5  . tamsulosin (FLOMAX) 0.4 MG CAPS capsule Take 1 capsule (0.4 mg total) by mouth daily. 30 capsule 0  . vitamin B-12 (CYANOCOBALAMIN) 500 MCG tablet Take 1,000 mcg by mouth daily.     No current facility-administered medications for this visit.    Allergies:   Penicillins; Plavix; and Pletal    Social History:  The patient   reports that he has been smoking Cigarettes.  He has a 25 pack-year smoking history. He has never used smokeless tobacco. He reports that he does not drink alcohol or use illicit drugs.   Family History:  The patient's family history includes Colon cancer in his father; Heart disease in his mother.    ROS:   Please see the history of present illness.   Review of Systems  Musculoskeletal: Positive for back pain and joint pain.  All other systems reviewed and are negative.    PHYSICAL EXAM: VS:  BP 100/60 mmHg  Pulse 82  Ht _0  (1.88 m)  Wt 156 lb 1.9 oz (70.816 kg)  BMI 20.04 kg/m2  SpO2 99%    Wt Readings from Last 3 Encounters:  12/25/14 156 lb 1.9 oz (70.816 kg)  12/08/14 159 lb 8 oz (72.349 kg)  12/01/14 160 lb 6.4 oz (72.757 kg)     GEN: Well nourished, well developed, in no acute distress HEENT: normal Neck: no JVD, no masses Cardiac:  Normal S1/S2, RRR; no murmur ,  no rubs or gallops, no edema   Chest: Median sternotomy well healed without erythema or discharge Respiratory:  clear to auscultation bilaterally, no wheezing, rhonchi or rales. GI: soft, nontender, nondistended, + BS MS: no deformity or atrophy Skin: warm and dry  Neuro:  CNs II-XII intact, Strength and sensation are intact Psych: Normal affect   EKG:  EKG is ordered today.  It demonstrates:   NSR, HR 82, TWI 1 aVL, V5-6, no significant changes.    Recent Labs: 11/04/2014: Magnesium 2.2 11/05/2014: Hemoglobin 10.1*; Platelets 112* 11/26/2014: ALT 55*; BUN 20; Creatinine, Ser 1.05; Potassium 4.8; Sodium 139    Lipid Panel    Component Value Date/Time   CHOL 116 05/29/2014 0950   TRIG 136.0 05/29/2014 0950   HDL 36.70* 05/29/2014 0950   CHOLHDL 3 05/29/2014 0950   VLDL 27.2 05/29/2014 0950   LDLCALC 52 05/29/2014 0950      ASSESSMENT AND PLAN:  Coronary artery disease S/P CABG x 3:  He is progressing well after recent CABG. Continue aspirin,  statin, beta blocker, ACE inhibitor. Refer to  cardiac rehabilitation.  Ischemic cardiomyopathy:  EF 45-50% by intraoperative TEE. Continue beta blocker, ACE inhibitor.   Chronic systolic CHF (congestive heart failure):  Volume stable. NYHA 2-2b.  Post CABG Atrial Fibrillation:   maintaining NSR on higher dose beta blocker.   Essential hypertension, benign:  Blood pressure has been running lower. He has been dizzy at times. Decrease lisinopril to 5 mg daily.   Hyperlipidemia:  Continue statin.   PAD (peripheral artery disease):  Follow-up with Dr. Fletcher Anon as planned.   Chronic kidney disease, stage II (mild):  Recent creatinine normal.    Medication Changes: Current medicines are reviewed at length with the patient today.  Concerns regarding medicines are as outlined above.  The following changes have been made:   Discontinued Medications   MUPIROCIN OINTMENT (BACTROBAN) 2 %    APPLY TO EACH NOSTRIL TWICE A DAY FOR 5 DAYS   Modified Medications   Modified Medication Previous Medication   LISINOPRIL (PRINIVIL,ZESTRIL) 5 MG TABLET lisinopril (PRINIVIL,ZESTRIL) 10 MG tablet      Take 1 tablet (5 mg total) by mouth daily.    Take 1 tablet (10 mg total) by mouth daily.   New Prescriptions   No medications on file    Labs/ tests ordered today include:   Orders Placed This Encounter  Procedures  . EKG 12-Lead     Disposition:   FU with Dr. [name] 2 mos.    Signed, Versie Starks, MHS 12/25/2014 10:41 AM    [facility] [address], [place], [place]  [address] Phone: [phone]; Fax: [phone]

## 2014-12-25 ENCOUNTER — Encounter: Payer: Self-pay | Admitting: Physician Assistant

## 2014-12-25 ENCOUNTER — Ambulatory Visit (INDEPENDENT_AMBULATORY_CARE_PROVIDER_SITE_OTHER): Payer: Commercial Managed Care - HMO | Admitting: Physician Assistant

## 2014-12-25 VITALS — BP 100/60 | HR 82 | Ht 74.0 in | Wt 156.1 lb

## 2014-12-25 DIAGNOSIS — I739 Peripheral vascular disease, unspecified: Secondary | ICD-10-CM

## 2014-12-25 DIAGNOSIS — I5022 Chronic systolic (congestive) heart failure: Secondary | ICD-10-CM

## 2014-12-25 DIAGNOSIS — I2510 Atherosclerotic heart disease of native coronary artery without angina pectoris: Secondary | ICD-10-CM

## 2014-12-25 DIAGNOSIS — Z951 Presence of aortocoronary bypass graft: Secondary | ICD-10-CM | POA: Diagnosis not present

## 2014-12-25 DIAGNOSIS — I255 Ischemic cardiomyopathy: Secondary | ICD-10-CM

## 2014-12-25 DIAGNOSIS — E785 Hyperlipidemia, unspecified: Secondary | ICD-10-CM

## 2014-12-25 DIAGNOSIS — N182 Chronic kidney disease, stage 2 (mild): Secondary | ICD-10-CM

## 2014-12-25 DIAGNOSIS — I2584 Coronary atherosclerosis due to calcified coronary lesion: Secondary | ICD-10-CM | POA: Diagnosis not present

## 2014-12-25 DIAGNOSIS — I480 Paroxysmal atrial fibrillation: Secondary | ICD-10-CM

## 2014-12-25 DIAGNOSIS — I10 Essential (primary) hypertension: Secondary | ICD-10-CM

## 2014-12-25 MED ORDER — LISINOPRIL 5 MG PO TABS: 5.0000 mg | ORAL_TABLET | Freq: Every day | ORAL | Status: DC

## 2014-12-25 NOTE — Patient Instructions (Signed)
Medication Instructions:  DECREASE LISINOPRIL TO 5 MG DAILY; NEW RX SENT IN TO [facility]  Labwork: NONE  Testing/Procedures: NONE  Follow-Up: DR. Tamala Julian 2 MONTHS WITH   Any Other Special Instructions Will Be Listed Below (If Applicable). CARDIAC REHAB AT [place]

## 2014-12-26 ENCOUNTER — Telehealth: Payer: Self-pay | Admitting: Interventional Cardiology

## 2014-12-26 NOTE — Telephone Encounter (Signed)
New message     Pt checking to see if paperwork for cardiac rehab has been completed Please call to let pt know status of paperwork

## 2014-12-26 NOTE — Telephone Encounter (Signed)
Spoke with patient about cardiac rehab.  He is aware the paperwork that needs to be signed is in Dr Thompson Caul box and he will be back in the office on Tuesday 8/9.  Pt would like to start Cardiac Rehab on Thursday if possible.

## 2014-12-30 NOTE — Telephone Encounter (Signed)
[facility] cardiac ref form signed and faxed

## 2014-12-31 ENCOUNTER — Other Ambulatory Visit: Payer: Self-pay | Admitting: *Deleted

## 2014-12-31 MED ORDER — METFORMIN HCL 1000 MG PO TABS: 1000.0000 mg | ORAL_TABLET | Freq: Two times a day (BID) | ORAL | Status: DC

## 2014-12-31 MED ORDER — GLUCOSE BLOOD VI STRP: ORAL_STRIP | Status: DC

## 2014-12-31 MED ORDER — ACCU-CHEK FASTCLIX LANCETS MISC: Status: DC

## 2014-12-31 MED ORDER — ACCU-CHEK AVIVA PLUS W/DEVICE KIT: PACK | Status: DC

## 2015-01-08 ENCOUNTER — Encounter (HOSPITAL_COMMUNITY)
Admission: RE | Admit: 2015-01-08 | Discharge: 2015-01-08 | Disposition: A | Discharge status: Home / Self Care | Payer: Commercial Managed Care - HMO | Source: Ambulatory Visit | Attending: Interventional Cardiology | Admitting: Interventional Cardiology

## 2015-01-08 DIAGNOSIS — Z951 Presence of aortocoronary bypass graft: Secondary | ICD-10-CM | POA: Insufficient documentation

## 2015-01-08 DIAGNOSIS — Z48812 Encounter for surgical aftercare following surgery on the circulatory system: Secondary | ICD-10-CM | POA: Insufficient documentation

## 2015-01-08 NOTE — Progress Notes (Signed)
Cardiac Rehab Medication Review by a Pharmacist  Does the patient  feel that his/her medications are working for him/her?  yes  Has the patient been experiencing any side effects to the medications prescribed?  no  Does the patient measure his/her own blood pressure or blood glucose at home?  Yes, occasionally   Does the patient have any problems obtaining medications due to transportation or finances?   No  Understanding of regimen: excellent Understanding of indications: excellent Potential of compliance: excellent    Pharmacist comments: patient is compliant with his medications ad is not experiencing any side effects    Alexea Blase C. Lennox Grumbles, PharmD Pharmacy Resident  Pager: [phone] 01/08/2015 8:32 AM

## 2015-01-12 ENCOUNTER — Encounter (HOSPITAL_COMMUNITY)
Admission: RE | Admit: 2015-01-12 | Discharge: 2015-01-12 | Disposition: A | Discharge status: Home / Self Care | Payer: Commercial Managed Care - HMO | Source: Ambulatory Visit | Attending: Interventional Cardiology | Admitting: Interventional Cardiology

## 2015-01-12 DIAGNOSIS — Z951 Presence of aortocoronary bypass graft: Secondary | ICD-10-CM | POA: Diagnosis not present

## 2015-01-12 DIAGNOSIS — Z48812 Encounter for surgical aftercare following surgery on the circulatory system: Secondary | ICD-10-CM | POA: Diagnosis present

## 2015-01-12 LAB — GLUCOSE, CAPILLARY: Glucose-Capillary: 156 mg/dL — ABNORMAL HIGH (ref 65–99)

## 2015-01-12 NOTE — Progress Notes (Signed)
Pt started cardiac rehab today.  Pt tolerated light exercise without difficulty. VSS, with low pre exercise bp which responded well to gatorade and water.  Pt ate a very light breakfast with only enough water to take his medications.  Advised pt to increase his fluid intake as well as eat heavier on exercise days,  telemetry-Sr with rare pac and pvc, asymptomatic.  Medication list reconciled.  Pt verbalized compliance with medications and denies barriers to compliance. PSYCHOSOCIAL ASSESSMENT:  PHQ-0. Pt exhibits positive coping skills, hopeful outlook with supportive family. 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 No psychosocial needs identified at this time, no psychosocial interventions necessary.   Pt cardiac rehab  goal is  to read more for pleasure.  Pt encouraged to participate in meditation and stress management  to increase ability to achieve these goals.   Pt long term cardiac rehab goal is get back into tennis, swimming and pickle ball.  Pt oriented to exercise equipment and routine.  Understanding verbalized. Cherre Huger, BSN

## 2015-01-13 ENCOUNTER — Other Ambulatory Visit: Payer: Self-pay | Admitting: Endocrinology

## 2015-01-13 ENCOUNTER — Other Ambulatory Visit: Payer: Self-pay | Admitting: *Deleted

## 2015-01-13 MED ORDER — METFORMIN HCL 1000 MG PO TABS: 1000.0000 mg | ORAL_TABLET | Freq: Two times a day (BID) | ORAL | Status: DC

## 2015-01-14 ENCOUNTER — Encounter: Payer: Self-pay | Admitting: Surgery

## 2015-01-14 ENCOUNTER — Ambulatory Visit (INDEPENDENT_AMBULATORY_CARE_PROVIDER_SITE_OTHER): Payer: Self-pay | Admitting: Surgery

## 2015-01-14 VITALS — BP 150/83 | HR 85 | Resp 20 | Ht 77.0 in | Wt 160.0 lb

## 2015-01-14 DIAGNOSIS — I2510 Atherosclerotic heart disease of native coronary artery without angina pectoris: Secondary | ICD-10-CM

## 2015-01-14 DIAGNOSIS — Z951 Presence of aortocoronary bypass graft: Secondary | ICD-10-CM

## 2015-01-14 NOTE — Progress Notes (Signed)
HPI: Patient returns for routine postoperative follow-up having undergone CABG x 3  on 11/03/2014. The patient's early postoperative recovery while in the hospital was notable for an uncomplicated postop course. Since hospital discharge the patient reports that he is feeling well and started cardiac rehab this week. He is walking without chest pain or shortness of breath. He has continued to abstain from smoking.   Current Outpatient Prescriptions  Medication Sig Dispense Refill  . ACCU-CHEK FASTCLIX LANCETS MISC Use to check blood sugar 2 times per day dx code E11.65 204 each 1  . acetaminophen (TYLENOL) 500 MG tablet Take 500 mg by mouth daily.     Marland Kitchen allopurinol (ZYLOPRIM) 100 MG tablet Take 1 tablet (100 mg total) by mouth daily. 90 tablet 1  . aspirin EC 325 MG EC tablet Take 1 tablet (325 mg total) by mouth daily. 30 tablet 0  . atorvastatin (LIPITOR) 40 MG tablet Take 1 tablet (40 mg total) by mouth daily at 6 PM. 90 tablet 1  . Blood Glucose Calibration (ACCU-CHEK SMARTVIEW CONTROL) LIQD Use as directed 1 each 1  . Blood Glucose Monitoring Suppl (ACCU-CHEK AVIVA PLUS) W/DEVICE KIT USE TO CHECK BLOOD SUGAR 2 TIMES PER DAY  1 kit 0  . Bromocriptine Mesylate 0.8 MG TABS Take 4 mg by mouth daily.     . carvedilol (COREG) 6.25 MG tablet Take 1 tablet (6.25 mg total) by mouth 2 (two) times daily. 60 tablet 1  . cholecalciferol (VITAMIN D) 1000 UNITS tablet Take 2,000 Units by mouth daily.    Marland Kitchen Co-Enzyme Q-10 100 MG CAPS Take 100 mg by mouth daily.    . ferrous sulfate 325 (65 FE) MG tablet Take 325 mg by mouth daily.     . fluorouracil (EFUDEX) 5 % cream Apply 1 application topically daily as needed (skin irritations).     . folic acid (FOLVITE) 300 MCG tablet Take 800 mcg by mouth daily.    Marland Kitchen glipiZIDE (GLUCOTROL XL) 10 MG 24 hr tablet Take 1 tablet (10 mg total) by mouth at bedtime. 90 tablet 1  . glucose blood (ACCU-CHEK AVIVA PLUS) test strip Use as instructed to check blood  sugar 2 times per day dx code E11.65 200 each 1  . ipratropium-albuterol (DUONEB) 0.5-2.5 (3) MG/3ML SOLN Take 3 mLs by nebulization 2 (two) times daily. 360 mL 0  . lisinopril (PRINIVIL,ZESTRIL) 5 MG tablet Take 1 tablet (5 mg total) by mouth daily. 30 tablet 11  . metFORMIN (GLUCOPHAGE) 1000 MG tablet Take 1 tablet (1,000 mg total) by mouth 2 (two) times daily with a meal. 180 tablet 3  . miglitol (GLYSET) 50 MG tablet Take 1 tablet (50 mg total) by mouth 2 (two) times daily. 180 tablet 1  . Multiple Vitamins-Minerals (PRESERVISION AREDS 2) CAPS Take 2 capsules by mouth daily.    Marland Kitchen NITROSTAT 0.4 MG SL tablet PLACE 1 TABLET (0.4 MG TOTAL) UNDER THE TONGUE EVERY 5 (FIVE) MINUTES AS NEEDED.  3  . omega-3 acid ethyl esters (LOVAZA) 1 G capsule Take 1 g by mouth daily.    . sitaGLIPtin (JANUVIA) 100 MG tablet Take 1 tablet (100 mg total) by mouth daily. 30 tablet 5  . tamsulosin (FLOMAX) 0.4 MG CAPS capsule Take 1 capsule (0.4 mg total) by mouth daily. 30 capsule 0  . vitamin B-12 (CYANOCOBALAMIN) 500 MCG tablet Take 1,000 mcg by mouth daily.    . vitamin E 100 UNIT capsule Take 100 Units by mouth daily.  No current facility-administered medications for this visit.    Physical Exam:  BP 150/83 mmHg  Pulse 85  Resp 20  Ht _0  (1.956 m)  Wt 160 lb (72.576 kg)  BMI 18.97 kg/m2  SpO2 97% He looks well. Lung exam is clear. Cardiac exam shows a regular rate and rhythm with normal heart sounds. Chest incision is healing well and sternum is stable. The leg incisions are healing well and there is no peripheral edema.    Diagnostic Tests:  None today  Impression:  Overall I think he is doing well. I encouraged him to continue walking and to finish cardiac rehab. He is driving now but should not lift anything heavier than 10 lbs for three months postop. I asked him to wait for three months postop to return to tennis and pickle ball.   Plan:  He has a follow up appt with Dr. Tamala Julian  in September.   Gaye Pollack, MD [facility] [phone]

## 2015-01-16 ENCOUNTER — Encounter (HOSPITAL_COMMUNITY)
Admission: RE | Admit: 2015-01-16 | Discharge: 2015-01-16 | Disposition: A | Discharge status: Home / Self Care | Payer: Commercial Managed Care - HMO | Source: Ambulatory Visit | Attending: Interventional Cardiology | Admitting: Interventional Cardiology

## 2015-01-16 DIAGNOSIS — Z48812 Encounter for surgical aftercare following surgery on the circulatory system: Secondary | ICD-10-CM | POA: Diagnosis not present

## 2015-01-16 LAB — GLUCOSE, CAPILLARY
Glucose-Capillary: 172 mg/dL — ABNORMAL HIGH (ref 65–99)
Glucose-Capillary: 175 mg/dL — ABNORMAL HIGH (ref 65–99)

## 2015-01-17 ENCOUNTER — Other Ambulatory Visit: Payer: Self-pay | Admitting: Surgery

## 2015-01-19 ENCOUNTER — Other Ambulatory Visit: Payer: Self-pay | Admitting: Surgery

## 2015-01-19 ENCOUNTER — Encounter (HOSPITAL_COMMUNITY)
Admission: RE | Admit: 2015-01-19 | Discharge: 2015-01-19 | Disposition: A | Discharge status: Home / Self Care | Payer: Commercial Managed Care - HMO | Source: Ambulatory Visit | Attending: Interventional Cardiology | Admitting: Interventional Cardiology

## 2015-01-19 DIAGNOSIS — Z48812 Encounter for surgical aftercare following surgery on the circulatory system: Secondary | ICD-10-CM | POA: Diagnosis not present

## 2015-01-23 ENCOUNTER — Encounter (HOSPITAL_COMMUNITY)
Admission: RE | Admit: 2015-01-23 | Discharge: 2015-01-23 | Disposition: A | Discharge status: Home / Self Care | Payer: Commercial Managed Care - HMO | Source: Ambulatory Visit | Attending: Interventional Cardiology | Admitting: Interventional Cardiology

## 2015-01-23 DIAGNOSIS — Z48812 Encounter for surgical aftercare following surgery on the circulatory system: Secondary | ICD-10-CM | POA: Insufficient documentation

## 2015-01-23 DIAGNOSIS — Z951 Presence of aortocoronary bypass graft: Secondary | ICD-10-CM | POA: Insufficient documentation

## 2015-01-23 LAB — GLUCOSE, CAPILLARY
Glucose-Capillary: 243 mg/dL — ABNORMAL HIGH (ref 65–99)
Glucose-Capillary: 211 mg/dL — ABNORMAL HIGH (ref 65–99)

## 2015-01-30 ENCOUNTER — Encounter (HOSPITAL_COMMUNITY)
Admission: RE | Admit: 2015-01-30 | Discharge: 2015-01-30 | Disposition: A | Discharge status: Home / Self Care | Payer: Commercial Managed Care - HMO | Source: Ambulatory Visit | Attending: Interventional Cardiology | Admitting: Interventional Cardiology

## 2015-01-30 DIAGNOSIS — Z48812 Encounter for surgical aftercare following surgery on the circulatory system: Secondary | ICD-10-CM | POA: Diagnosis not present

## 2015-01-30 LAB — GLUCOSE, CAPILLARY
Glucose-Capillary: 227 mg/dL — ABNORMAL HIGH (ref 65–99)
Glucose-Capillary: 252 mg/dL — ABNORMAL HIGH (ref 65–99)

## 2015-02-02 ENCOUNTER — Encounter (HOSPITAL_COMMUNITY)
Admission: RE | Admit: 2015-02-02 | Discharge: 2015-02-02 | Disposition: A | Discharge status: Home / Self Care | Payer: Commercial Managed Care - HMO | Source: Ambulatory Visit | Attending: Interventional Cardiology | Admitting: Interventional Cardiology

## 2015-02-02 DIAGNOSIS — Z48812 Encounter for surgical aftercare following surgery on the circulatory system: Secondary | ICD-10-CM | POA: Diagnosis not present

## 2015-02-02 NOTE — Progress Notes (Signed)
Reviewed home exercise guidelines with patient including endpoints, temperature precautions, target heart rate and rate of perceived exertion. Pt plans to walk, swim and [place] [employer] as [facility] mode of [facility] exercise. Pt voices understanding of instructions given. Sol Passer, MS, ACSM CCEP

## 2015-02-06 ENCOUNTER — Encounter (HOSPITAL_COMMUNITY)
Admission: RE | Admit: 2015-02-06 | Discharge: 2015-02-06 | Disposition: A | Discharge status: Home / Self Care | Payer: Commercial Managed Care - HMO | Source: Ambulatory Visit | Attending: Interventional Cardiology | Admitting: Interventional Cardiology

## 2015-02-06 DIAGNOSIS — Z48812 Encounter for surgical aftercare following surgery on the circulatory system: Secondary | ICD-10-CM | POA: Diagnosis not present

## 2015-02-09 ENCOUNTER — Encounter (HOSPITAL_COMMUNITY)
Admission: RE | Admit: 2015-02-09 | Discharge: 2015-02-09 | Disposition: A | Discharge status: Home / Self Care | Payer: Commercial Managed Care - HMO | Source: Ambulatory Visit | Attending: Interventional Cardiology | Admitting: Interventional Cardiology

## 2015-02-09 DIAGNOSIS — Z48812 Encounter for surgical aftercare following surgery on the circulatory system: Secondary | ICD-10-CM | POA: Diagnosis not present

## 2015-02-09 LAB — GLUCOSE, CAPILLARY: Glucose-Capillary: 187 mg/dL — ABNORMAL HIGH (ref 65–99)

## 2015-02-12 ENCOUNTER — Telehealth: Payer: Self-pay | Admitting: Cardiovascular Disease

## 2015-02-12 NOTE — Telephone Encounter (Signed)
Verified with Pharmacy that patient does have a Lisinopril '5mg'$  once daily by mouth rx in their system. Associate verified there are refills on hand for next time. His recent rx he picked up was from an old number and the new number has 11 refills when he is ready.  Called patient to let him know he does have more refills in the system for his Lisinopril. When he runs out, he will notify [facility] and they will refill his rx.

## 2015-02-12 NOTE — Telephone Encounter (Signed)
Pt needs refill pf lisinopril '5mg'$ 

## 2015-02-13 ENCOUNTER — Encounter (HOSPITAL_COMMUNITY)
Admission: RE | Admit: 2015-02-13 | Discharge: 2015-02-13 | Disposition: A | Discharge status: Home / Self Care | Payer: Commercial Managed Care - HMO | Source: Ambulatory Visit | Attending: Interventional Cardiology | Admitting: Interventional Cardiology

## 2015-02-13 DIAGNOSIS — Z48812 Encounter for surgical aftercare following surgery on the circulatory system: Secondary | ICD-10-CM | POA: Diagnosis not present

## 2015-02-16 ENCOUNTER — Encounter (HOSPITAL_COMMUNITY)
Admission: RE | Admit: 2015-02-16 | Discharge: 2015-02-16 | Disposition: A | Discharge status: Home / Self Care | Payer: Commercial Managed Care - HMO | Source: Ambulatory Visit | Attending: Interventional Cardiology | Admitting: Interventional Cardiology

## 2015-02-16 DIAGNOSIS — Z48812 Encounter for surgical aftercare following surgery on the circulatory system: Secondary | ICD-10-CM | POA: Diagnosis not present

## 2015-02-20 ENCOUNTER — Encounter (HOSPITAL_COMMUNITY)
Admission: RE | Admit: 2015-02-20 | Discharge: 2015-02-20 | Disposition: A | Discharge status: Home / Self Care | Payer: Commercial Managed Care - HMO | Source: Ambulatory Visit | Attending: Interventional Cardiology | Admitting: Interventional Cardiology

## 2015-02-20 DIAGNOSIS — Z48812 Encounter for surgical aftercare following surgery on the circulatory system: Secondary | ICD-10-CM | POA: Diagnosis not present

## 2015-02-23 ENCOUNTER — Encounter (HOSPITAL_COMMUNITY)
Admission: RE | Admit: 2015-02-23 | Discharge: 2015-02-23 | Disposition: A | Discharge status: Home / Self Care | Payer: Commercial Managed Care - HMO | Source: Ambulatory Visit | Attending: Interventional Cardiology | Admitting: Interventional Cardiology

## 2015-02-23 DIAGNOSIS — Z951 Presence of aortocoronary bypass graft: Secondary | ICD-10-CM | POA: Insufficient documentation

## 2015-02-23 DIAGNOSIS — Z48812 Encounter for surgical aftercare following surgery on the circulatory system: Secondary | ICD-10-CM | POA: Insufficient documentation

## 2015-02-26 ENCOUNTER — Other Ambulatory Visit (INDEPENDENT_AMBULATORY_CARE_PROVIDER_SITE_OTHER): Payer: Commercial Managed Care - HMO

## 2015-02-26 DIAGNOSIS — E785 Hyperlipidemia, unspecified: Secondary | ICD-10-CM | POA: Diagnosis not present

## 2015-02-26 DIAGNOSIS — IMO0002 Reserved for concepts with insufficient information to code with codable children: Secondary | ICD-10-CM

## 2015-02-26 DIAGNOSIS — E1165 Type 2 diabetes mellitus with hyperglycemia: Secondary | ICD-10-CM

## 2015-02-26 LAB — BASIC METABOLIC PANEL
BUN: 31 mg/dL — ABNORMAL HIGH (ref 6–23)
CO2: 24 mEq/L (ref 19–32)
Calcium: 9.2 mg/dL (ref 8.4–10.5)
Chloride: 111 mEq/L (ref 96–112)
Creatinine, Ser: 1.08 mg/dL (ref 0.40–1.50)
GFR: 71.06 mL/min (ref >60.00)
Glucose, Bld: 181 mg/dL — ABNORMAL HIGH (ref 70–99)
Potassium: 4.6 mEq/L (ref 3.5–5.1)
Sodium: 141 mEq/L (ref 135–145)

## 2015-02-26 LAB — LIPID PANEL
Cholesterol: 116 mg/dL (ref 0–200)
HDL: 38.8 mg/dL — ABNORMAL LOW (ref >39.00)
LDL Cholesterol: 58 mg/dL (ref 0–99)
NonHDL: 77.44
Total CHOL/HDL Ratio: 3
Triglycerides: 96 mg/dL (ref 0.0–149.0)
VLDL: 19.2 mg/dL (ref 0.0–40.0)

## 2015-02-26 LAB — HEMOGLOBIN A1C: Hgb A1c MFr Bld: 7.8 % — ABNORMAL HIGH (ref 4.6–6.5)

## 2015-02-27 ENCOUNTER — Encounter (HOSPITAL_COMMUNITY)
Admission: RE | Admit: 2015-02-27 | Discharge: 2015-02-27 | Disposition: A | Discharge status: Home / Self Care | Payer: Commercial Managed Care - HMO | Source: Ambulatory Visit | Attending: Interventional Cardiology | Admitting: Interventional Cardiology

## 2015-02-27 DIAGNOSIS — Z48812 Encounter for surgical aftercare following surgery on the circulatory system: Secondary | ICD-10-CM | POA: Diagnosis not present

## 2015-03-02 ENCOUNTER — Encounter (HOSPITAL_COMMUNITY)
Admission: RE | Admit: 2015-03-02 | Discharge: 2015-03-02 | Disposition: A | Discharge status: Home / Self Care | Payer: Commercial Managed Care - HMO | Source: Ambulatory Visit | Attending: Interventional Cardiology | Admitting: Interventional Cardiology

## 2015-03-02 DIAGNOSIS — Z48812 Encounter for surgical aftercare following surgery on the circulatory system: Secondary | ICD-10-CM | POA: Diagnosis not present

## 2015-03-03 ENCOUNTER — Ambulatory Visit (INDEPENDENT_AMBULATORY_CARE_PROVIDER_SITE_OTHER): Payer: Commercial Managed Care - HMO | Admitting: Endocrinology

## 2015-03-03 ENCOUNTER — Encounter: Payer: Commercial Managed Care - HMO | Attending: Endocrinology | Admitting: Nutrition

## 2015-03-03 ENCOUNTER — Encounter: Payer: Self-pay | Admitting: Endocrinology

## 2015-03-03 VITALS — BP 132/68 | HR 72 | Temp 98.2°F | Resp 16 | Ht 74.0 in | Wt 166.0 lb

## 2015-03-03 DIAGNOSIS — E1165 Type 2 diabetes mellitus with hyperglycemia: Secondary | ICD-10-CM | POA: Insufficient documentation

## 2015-03-03 DIAGNOSIS — Z713 Dietary counseling and surveillance: Secondary | ICD-10-CM | POA: Diagnosis not present

## 2015-03-03 DIAGNOSIS — E1122 Type 2 diabetes mellitus with diabetic chronic kidney disease: Secondary | ICD-10-CM | POA: Insufficient documentation

## 2015-03-03 DIAGNOSIS — IMO0001 Reserved for inherently not codable concepts without codable children: Secondary | ICD-10-CM

## 2015-03-03 DIAGNOSIS — Z23 Encounter for immunization: Secondary | ICD-10-CM

## 2015-03-03 DIAGNOSIS — Z794 Long term (current) use of insulin: Secondary | ICD-10-CM | POA: Diagnosis not present

## 2015-03-03 DIAGNOSIS — E785 Hyperlipidemia, unspecified: Secondary | ICD-10-CM | POA: Diagnosis not present

## 2015-03-03 DIAGNOSIS — I10 Essential (primary) hypertension: Secondary | ICD-10-CM

## 2015-03-03 DIAGNOSIS — N182 Chronic kidney disease, stage 2 (mild): Secondary | ICD-10-CM | POA: Insufficient documentation

## 2015-03-03 DIAGNOSIS — IMO0002 Reserved for concepts with insufficient information to code with codable children: Secondary | ICD-10-CM

## 2015-03-03 MED ORDER — VICTOZA 18 MG/3ML ~~LOC~~ SOPN: 1.2000 mg | PEN_INJECTOR | Freq: Every day | SUBCUTANEOUS | Status: DC

## 2015-03-03 NOTE — Progress Notes (Signed)
We discussed how this medication works and how it will help his blood sugar control.  We also discussed how to take this medication, and how to adjust the dose upward from 0.6 to 1.2 after 7 days. He was given a starter kit with directions for pen use and a toll free telephone number to call if questions. He re demonstrated how to attach the needle, dial the dose and injected the medication into a training pad, without any difficulty.  We discussed rotation sites of the injection, and also low blood sugars.   He was told that this medication does not drop the blood sugars low, but that the other orals he is on, may do so.  We reviewed the symptoms and treatments of low blood sugars, and he had no final questions.

## 2015-03-03 NOTE — Progress Notes (Signed)
Patient ID: Mario [name], male   DOB: 1941/01/30, 74 y.o.   MRN: [id number]   Reason for Appointment: Diabetes follow-up   History of Present Illness   Diagnosis: Type 2 DIABETES MELITUS, date of diagnosis:  1989     Previous history: He was initially diagnosed when hospitalized for pancreatitis and his previous endocrinologist had treated him with multiple medications because of progression of his diabetes. He was also put on Cycloset  which he has tolerated maximum dose He has had adjustments of his medication dosages the last couple of years and Januvia was restarted in 5/14 when blood sugars were higher. Dosages have been adjusted based on renal function Previously an A1c levels have been ranging from 6.6-7.5 His metformin was increased  when renal function was better and glipizide was changed to glipizide ER 10 mg. Did not appear to have better blood sugar control with Invokana  Recent history:  He has regained some of the weight he had previously lost His A1c is higher than usual and nearly 8% now Although he is able to be active he appears to be increasing his caloric intake and portions Also have difficulty affording his medications especially Cycloset  Blood sugar patterns and problems:  He has not brought his monitor for download today  FASTING blood sugars are significantly higher than before including on the labs  He does not check readings after meals as directed and these are probably higher at times, sometimes 200 He is fairly compliant with taking his multidrug regimen Occasionally may forget to take Glyset at the start of the meal Still on maximum dose metformin and Januvia since renal function has been stable in the normal range  Oral hypoglycemic drugs: Januvia, Glyset, glipizide ER 10 mg , Metformin 1 g twice a day, Cycloset         Side effects from medications: None Proper timing of medications in relation to meals: usually  Monitors blood  glucose: Once a day.    Glucometer:   Accu-Chek         Blood Glucose readings from 150-200  Hypoglycemia: None recently      Meals: 2-3 meals per day, no lunch often; breakfast is cereal, [place] bacon, sometimes bread and juice at breakfast, sweets     Dietician 3/16       Physical activity: exercise: Rehab 2/7, going to the gym 1/7,      Wt Readings from Last 3 Encounters:  03/03/15 166 lb (75.297 kg)  01/14/15 160 lb (72.576 kg)  01/08/15 159 lb 2.8 oz (72.2 kg)    Lab Results  Component Value Date   HGBA1C 7.8* 02/26/2015   HGBA1C 7.3* 11/26/2014   HGBA1C 7.2* 10/30/2014   Lab Results  Component Value Date   MICROALBUR 27.2* 05/29/2014   [place] 58 02/26/2015   CREATININE 1.08 02/26/2015     LABS:  Appointment on 02/26/2015  Component Date Value Ref Range Status  . Hgb A1c MFr Bld 02/26/2015 7.8* 4.6 - 6.5 % Final   Glycemic Control Guidelines for People with Diabetes:Non Diabetic:  <6%Goal of Therapy: <7%Additional Action Suggested:  >8%   . Sodium 02/26/2015 141  135 - 145 mEq/L Final  . Potassium 02/26/2015 4.6  3.5 - 5.1 mEq/L Final  . Chloride 02/26/2015 111  96 - 112 mEq/L Final  . CO2 02/26/2015 24  19 - 32 mEq/L Final  . Glucose, Bld 02/26/2015 181* 70 - 99 mg/dL Final  . BUN 02/26/2015 31* 6 -  23 mg/dL Final  . Creatinine, Ser 02/26/2015 1.08  0.40 - 1.50 mg/dL Final  . Calcium 02/26/2015 9.2  8.4 - 10.5 mg/dL Final  . GFR 02/26/2015 71.06  >60.00 mL/min Final  . Cholesterol 02/26/2015 116  0 - 200 mg/dL Final   ATP III Classification       Desirable:  < 200 mg/dL               Borderline High:  200 - 239 mg/dL          High:  > = 240 mg/dL  . Triglycerides 02/26/2015 96.0  0.0 - 149.0 mg/dL Final   Normal:  <150 mg/dLBorderline High:  150 - 199 mg/dL  . HDL 02/26/2015 38.80* >39.00 mg/dL Final  . VLDL 02/26/2015 19.2  0.0 - 40.0 mg/dL Final  . LDL Cholesterol 02/26/2015 58  0 - 99 mg/dL Final  . Total CHOL/HDL Ratio 02/26/2015 3   Final                   Men          Women1/2 Average Risk     3.4          3.3Average Risk          5.0          4.42X Average Risk          9.6          7.13X Average Risk          15.0          11.0                      . NonHDL 02/26/2015 77.44   Final   NOTE:  Non-HDL goal should be 30 mg/dL higher than patient's LDL goal (i.e. LDL goal of < 70 mg/dL, would have non-HDL goal of < 100 mg/dL)      Medication List       This list is accurate as of: 03/03/15 11:59 PM.  Always use your most recent med list.               ACCU-CHEK AVIVA PLUS W/DEVICE Kit  USE TO CHECK BLOOD SUGAR 2 TIMES PER DAY     ACCU-CHEK FASTCLIX LANCETS Misc  Use to check blood sugar 2 times per day dx code E11.65     ACCU-CHEK SMARTVIEW CONTROL Liqd  Use as directed     acetaminophen 500 MG tablet  Commonly known as:  TYLENOL  Take 500 mg by mouth daily.     allopurinol 100 MG tablet  Commonly known as:  ZYLOPRIM  Take 1 tablet (100 mg total) by mouth daily.     aspirin 325 MG EC tablet  Take 1 tablet (325 mg total) by mouth daily.     atorvastatin 40 MG tablet  Commonly known as:  LIPITOR  Take 1 tablet (40 mg total) by mouth daily at 6 PM.     Bromocriptine Mesylate 0.8 MG Tabs  Take 4 mg by mouth daily.     carvedilol 6.25 MG tablet  Commonly known as:  COREG  TAKE 1 TABLET BY MOUTH TWICE A DAY     cholecalciferol 1000 UNITS tablet  Commonly known as:  VITAMIN D  Take 2,000 Units by mouth daily.     Co-Enzyme Q-10 100 MG Caps  Take 100 mg by mouth daily.     ferrous sulfate 325 (65 FE)  MG tablet  Take 325 mg by mouth daily.     fluorouracil 5 % cream  Commonly known as:  EFUDEX  Apply 1 application topically daily as needed (skin irritations).     folic acid 852 MCG tablet  Commonly known as:  FOLVITE  Take 800 mcg by mouth daily.     glipiZIDE 10 MG 24 hr tablet  Commonly known as:  GLUCOTROL XL  Take 1 tablet (10 mg total) by mouth at bedtime.     glucose blood test strip  Commonly known  as:  ACCU-CHEK AVIVA PLUS  Use as instructed to check blood sugar 2 times per day dx code E11.65     ipratropium-albuterol 0.5-2.5 (3) MG/3ML Soln  Commonly known as:  DUONEB  Take 3 mLs by nebulization 2 (two) times daily.     lisinopril 5 MG tablet  Commonly known as:  PRINIVIL,ZESTRIL  Take 1 tablet (5 mg total) by mouth daily.     metFORMIN 1000 MG tablet  Commonly known as:  GLUCOPHAGE  Take 1 tablet (1,000 mg total) by mouth 2 (two) times daily with a meal.     miglitol 50 MG tablet  Commonly known as:  GLYSET  Take 1 tablet (50 mg total) by mouth 2 (two) times daily.     NITROSTAT 0.4 MG SL tablet  Generic drug:  nitroGLYCERIN  PLACE 1 TABLET (0.4 MG TOTAL) UNDER THE TONGUE EVERY 5 (FIVE) MINUTES AS NEEDED.     omega-3 acid ethyl esters 1 G capsule  Commonly known as:  LOVAZA  Take 1 g by mouth daily.     PRESERVISION AREDS 2 Caps  Take 2 capsules by mouth daily.     sitaGLIPtin 100 MG tablet  Commonly known as:  JANUVIA  Take 1 tablet (100 mg total) by mouth daily.     tamsulosin 0.4 MG Caps capsule  Commonly known as:  FLOMAX  Take 1 capsule (0.4 mg total) by mouth daily.     VICTOZA 18 MG/3ML Sopn  Generic drug:  Liraglutide  Inject 0.2 mLs (1.2 mg total) into the skin daily. Inject once daily at the same time     vitamin B-12 500 MCG tablet  Commonly known as:  CYANOCOBALAMIN  Take 1,000 mcg by mouth daily.     vitamin E 100 UNIT capsule  Take 100 Units by mouth daily.        Allergies:  Allergies  Allergen Reactions  . Penicillins Anaphylaxis  . Plavix [Clopidogrel Bisulfate] Hives  . Pletal [Cilostazol] Itching    Past Medical History  Diagnosis Date  . Essential hypertension, benign   . HLD (hyperlipidemia)   . PAD (peripheral artery disease) (HCC)     Dr. Fletcher Anon  . Orthostasis   . CAD (coronary artery disease)     a.  s/p IMI and BMS 1997;  b. Myoview 4/16:  inf scar with peri-infarct ischemia, EF 34%; high risk;  c. LHC 5/16:  3 v CAD  >> CABG (free L-LAD, S-OM, S-dRCA)  . Gout   . Pneumonia     hx of  . Chronic kidney disease, stage II (mild)   . Arthritis   . Type II or unspecified type diabetes mellitus without mention of complication, uncontrolled   . Ischemic cardiomyopathy     a. EF by myoview 4/16 34%; b. LHC 5/16: EF 40-45%;  c. intraop TEE 6/16: EF 45-50%  . Chronic systolic CHF (congestive heart failure) ([place])   . Carotid stenosis  a. carotid US 6/16:  bilat ICA 1-39%    Past Surgical History  Procedure Laterality Date  . Hernia repair    . Cholecystectomy    . Cataract extraction    . Melanoma excision    . Skin lesion excision      multiple  . Abdominal aortagram N/A 10/09/2013    Procedure: ABDOMINAL Maxcine Ham;  Surgeon: Wellington Hampshire, MD;  Location: [facility] CATH LAB;  Service: Cardiovascular;  Laterality: N/A;  . Cardiac catheterization N/A 10/02/2014    Procedure: Left Heart Cath and Coronary Angiography;  Surgeon: Belva Crome, MD;  Location: [place] CV LAB;  Service: Cardiovascular;  Laterality: N/A;  . Colonoscopy    . Coronary artery bypass graft N/A 11/03/2014    Procedure: CORONARY ARTERY BYPASS GRAFTING  times three using left internal mammary and right greater saphenous vein;  Surgeon: Gaye Pollack, MD;  Location: [place] OR;  Service: Open Heart Surgery;  Laterality: N/A;  . Tee without cardioversion N/A 11/03/2014    Procedure: TRANSESOPHAGEAL ECHOCARDIOGRAM (TEE);  Surgeon: Gaye Pollack, MD;  Location: [place];  Service: Open Heart Surgery;  Laterality: N/A;    Family History  Problem Relation Age of Onset  . Colon cancer Father   . Heart disease Mother     Social History:  reports that he has been smoking Cigarettes.  He has a 25 pack-year smoking history. He has never used smokeless tobacco. He reports that he does not drink alcohol or use illicit drugs.  Review of Systems:  Hypertension:  currently taking lisinopril and carvedilol and blood pressure is well-controlled  LIVER  action abnormality: Not stated this has been evaluated by his PCP  Lab Results  Component Value Date   ALT 55* 11/26/2014    Lipids: Well controlled with atorvastatin.  HDL is  persistently low, triglycerides normal   Lab Results  Component Value Date   CHOL 116 02/26/2015   HDL 38.80* 02/26/2015   LDLCALC 58 02/26/2015   TRIG 96.0 02/26/2015   CHOLHDL 3 02/26/2015   Able to exercise at cardiac rehabilitation now along with some exercises at home without difficulty  Foot exam done in 7/16, has callus formation on the left and significant sensory loss as well as decreased pulses  History of BPH present, taking Flomax   Examination:   BP 132/68 mmHg  Pulse 72  Temp(Src) 98.2 F (36.8 C)  Resp 16  Ht 6' 2"  (1.88 m)  Wt 166 lb (75.297 kg)  BMI 21.30 kg/m2  SpO2 99%  Body mass index is 21.3 kg/(m^2).   No pedal edema  ASSESSMENT/ PLAN:   Diabetes type 2   Blood glucose control is overall higher than before and this is partly related to his regaining some of the weight he had lost He has tried to exercise but is not able to control his blood sugars with taking multiple drugs See history of present illness for detailed discussion of his current management, blood sugar patterns and problems identified  He is also concerned about the cost of Cycloset Discussed simplifying his medication regimen and also eliminating Cycloset; since he needs both fasting and postprandial control there will be a good candidate for a GLP-1 drug rather than Invokana since he has had variable renal function in the past  Discussed with the patient the nature of GLP-1 drugs, the actions on various organ systems, how they benefit blood glucose control, as well as the benefit of weight loss and  increase  satiety . Explained possible side effects especially nausea and vomiting initially; discussed safety information in package insert.  Described the injection technique and dosage titration of Victoza   starting with 0.6 mg once a day at the same time for the first week and then increasing to 1.2 mg if no symptoms of nausea.  Educational brochure on Victoza and he was instructed by the nurse educator  He will stop the Cycloset when he runs out and also not continue Januvia   Discussed reducing or stopping glipizide ER once his blood sugars are improved Encouraged him to check readings after meals more and be compliant with bring his monitor for download on each visit   Patient Instructions  Start VICTOZA injection as shown once daily at the same time of the day.  Dial the dose to 0.6 mg on the pen for the first week.  You may inject in the stomach, thigh or arm. You may experience nausea in the first few days which usually goes away.  You will feel fullness of the stomach with starting the medication and should try to keep the portions at meals small. After 1 week increase the dose to 1.17m daily if no nausea present.   If any questions or concerns are present call the office or the [facility] at [phone] Visit [email or web address].[email or web address] more useful information  May stop Januvia  No refill on Cycloset   Counseling time on subjects discussed above is over 50% of today's 25 minute visit   Derrious Bologna 03/04/2015, 10:07 AM

## 2015-03-03 NOTE — Patient Instructions (Addendum)
Inject Victoza once daily.   Start at the dose of 0.6 for 7 days.  If no nausea, increase the dose to 1.2  Call if questions.

## 2015-03-03 NOTE — Patient Instructions (Signed)
Start VICTOZA injection as shown once daily at the same time of the day.  Dial the dose to 0.6 mg on the pen for the first week.  You may inject in the stomach, thigh or arm. You may experience nausea in the first few days which usually goes away.  You will feel fullness of the stomach with starting the medication and should try to keep the portions at meals small. After 1 week increase the dose to 1.'2mg'$  daily if no nausea present.   If any questions or concerns are present call the office or the [place] helpline at [phone]. Visit [email or web address].info/ for more useful information  May stop Januvia  No refill on Cycloset

## 2015-03-04 LAB — MICROALBUMIN / CREATININE URINE RATIO
Creatinine,U: 129.6 mg/dL
Microalb Creat Ratio: 11.3 mg/g (ref 0.0–30.0)
Microalb, Ur: 14.7 mg/dL — ABNORMAL HIGH (ref 0.0–1.9)

## 2015-03-06 ENCOUNTER — Encounter (HOSPITAL_COMMUNITY)
Admission: RE | Admit: 2015-03-06 | Discharge: 2015-03-06 | Disposition: A | Discharge status: Home / Self Care | Payer: Commercial Managed Care - HMO | Source: Ambulatory Visit | Attending: Interventional Cardiology | Admitting: Interventional Cardiology

## 2015-03-06 ENCOUNTER — Other Ambulatory Visit: Payer: Self-pay | Admitting: *Deleted

## 2015-03-06 DIAGNOSIS — Z48812 Encounter for surgical aftercare following surgery on the circulatory system: Secondary | ICD-10-CM | POA: Diagnosis not present

## 2015-03-06 MED ORDER — VICTOZA 18 MG/3ML ~~LOC~~ SOPN: 1.2000 mg | PEN_INJECTOR | Freq: Every day | SUBCUTANEOUS | Status: DC

## 2015-03-09 ENCOUNTER — Encounter (HOSPITAL_COMMUNITY)
Admission: RE | Admit: 2015-03-09 | Discharge: 2015-03-09 | Disposition: A | Discharge status: Home / Self Care | Payer: Commercial Managed Care - HMO | Source: Ambulatory Visit | Attending: Interventional Cardiology | Admitting: Interventional Cardiology

## 2015-03-09 DIAGNOSIS — Z48812 Encounter for surgical aftercare following surgery on the circulatory system: Secondary | ICD-10-CM | POA: Diagnosis not present

## 2015-03-10 ENCOUNTER — Ambulatory Visit (INDEPENDENT_AMBULATORY_CARE_PROVIDER_SITE_OTHER): Payer: Commercial Managed Care - HMO | Admitting: Neurology

## 2015-03-10 ENCOUNTER — Ambulatory Visit (INDEPENDENT_AMBULATORY_CARE_PROVIDER_SITE_OTHER): Payer: Self-pay | Admitting: Neurology

## 2015-03-10 DIAGNOSIS — G5602 Carpal tunnel syndrome, left upper limb: Secondary | ICD-10-CM | POA: Diagnosis not present

## 2015-03-10 DIAGNOSIS — G5603 Carpal tunnel syndrome, bilateral upper limbs: Secondary | ICD-10-CM

## 2015-03-10 DIAGNOSIS — G5601 Carpal tunnel syndrome, right upper limb: Secondary | ICD-10-CM | POA: Diagnosis not present

## 2015-03-10 DIAGNOSIS — Z0289 Encounter for other administrative examinations: Secondary | ICD-10-CM

## 2015-03-10 NOTE — Progress Notes (Signed)
See procedure note.

## 2015-03-10 NOTE — Progress Notes (Signed)
  [facility]    Provider:  Dr Jaynee Eagles Referring Provider: [name], MD Primary Care Physician:  Kandice Hams, MD  History:  Mario [name] is a 74 y.o. male here as a referral from Dr. [name] for hand pain that started in June. He has numbness in the right hand in digits 2-4. Feels like he has a rubber glove on. Has difficulty holding a pen. More fine motor involved, he can grip a racket fine. He has some right-sided neck pain but no radicular symptoms.    Summary:   Nerve Conduction Studies were performed on the bilateral upper extremities.  The right median APB motor nerve showed prolonged distal onset latency (5.9 ms, N<4.0) and reduced amplitude(2.75m,N>3) The right Median 2nd Digit sensory nerve showed prolonged distal peak latency (5.0 ms, N<3.9). F Wave studies indicate that the right Median F wave has normal latency.  The left median APB motor nerve showed prolonged distal onset latency (5.3 ms, N<4.0). The left Median 2nd Digit sensory nerve showed prolonged distal peak latency (4.7 ms, N<3.9).  F Wave studies indicate that the left Median F wave has normal latency.  The right radial motor nerve was within normal limits.  The bilateral radial sensory nerves were within normal limits.   Bilateral Ulnar ADM motor nerves were within normal limits. F Wave studies indicate that the bilateral Ulnar F waves have normal latencies  The bilateralUlnar 5th digit sensory nerves were within normal limits.  EMG needle study of bilateral upper extremity muscles was performed. The right opponens pollicis muscle showed giant motor units, diminished motor unit recruitment, prolonged motor unit duration, increased spontaneous activity (positive sharp waves and fibrillation potentials).The following muscles were normal: bilateral Deltoid, bilateral Triceps, bilateral Pronator Teres, left Opponens Pollicis, bilateral First Dorsal Interosseous, bilateral C7/C8 paraspinal  muscles.   Conclusion: This is an abnormal study. 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 No suggestion of polyneuropathy or radiculopathy. Clinical correlation recommended.   ASarina Ill MD  [facility] [address]SJeffersonGMontrose [place] [address] Phone [phone] [phone]

## 2015-03-11 NOTE — Procedures (Signed)
[facility]    Provider:  Dr Jaynee Eagles Referring Provider: [name], MD Primary Care Physician:  Kandice Hams, MD  History:  Mario [name] is a 74 y.o. male here as a referral from Dr. [name] for hand pain that started in June. He has numbness in the right hand in digits 2-4. Feels like he has a rubber glove on. Has difficulty holding a pen. More fine motor involved, he can grip a racket fine. He has some right-sided neck pain but no radicular symptoms.    Summary:   Nerve Conduction Studies were performed on the bilateral upper extremities.  The right median APB motor nerve showed prolonged distal onset latency (5.9 ms, N<4.0) and reduced amplitude(2.42m,N>3) The right Median 2nd Digit sensory nerve showed prolonged distal peak latency (5.0 ms, N<3.9). F Wave studies indicate that the right Median F wave has normal latency.  The left median APB motor nerve showed prolonged distal onset latency (5.3 ms, N<4.0). The left Median 2nd Digit sensory nerve showed prolonged distal peak latency (4.7 ms, N<3.9).  F Wave studies indicate that the left Median F wave has normal latency.  The right radial motor nerve was within normal limits.  The bilateral radial sensory nerves were within normal limits.   Bilateral Ulnar ADM motor nerves were within normal limits. F Wave studies indicate that the bilateral Ulnar F waves have normal latencies  The bilateralUlnar 5th digit sensory nerves were within normal limits.  EMG needle study of bilateral upper extremity muscles was performed. The right opponens pollicis muscle showed giant motor units, diminished motor unit recruitment, prolonged motor unit duration, increased spontaneous activity (positive sharp waves and fibrillation potentials).The following muscles were normal: bilateral Deltoid, bilateral Triceps, bilateral Pronator Teres, left Opponens Pollicis, bilateral First Dorsal Interosseous, bilateral C7/C8 paraspinal  muscles.   Conclusion: This is an abnormal study. 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 No suggestion of polyneuropathy or radiculopathy. Clinical correlation recommended.   ASarina Ill MD  [facility] [address]SEast HarwichGSouth [place] [address] Phone [phone] [phone]

## 2015-03-13 ENCOUNTER — Encounter (HOSPITAL_COMMUNITY)
Admission: RE | Admit: 2015-03-13 | Discharge: 2015-03-13 | Disposition: A | Discharge status: Home / Self Care | Payer: Commercial Managed Care - HMO | Source: Ambulatory Visit | Attending: Interventional Cardiology | Admitting: Interventional Cardiology

## 2015-03-13 DIAGNOSIS — Z48812 Encounter for surgical aftercare following surgery on the circulatory system: Secondary | ICD-10-CM | POA: Diagnosis not present

## 2015-03-16 ENCOUNTER — Encounter (HOSPITAL_COMMUNITY)
Admission: RE | Admit: 2015-03-16 | Discharge: 2015-03-16 | Disposition: A | Discharge status: Home / Self Care | Payer: Commercial Managed Care - HMO | Source: Ambulatory Visit | Attending: Interventional Cardiology | Admitting: Interventional Cardiology

## 2015-03-16 DIAGNOSIS — Z48812 Encounter for surgical aftercare following surgery on the circulatory system: Secondary | ICD-10-CM | POA: Diagnosis not present

## 2015-03-19 ENCOUNTER — Encounter: Payer: Self-pay | Admitting: Interventional Cardiology

## 2015-03-19 ENCOUNTER — Ambulatory Visit (INDEPENDENT_AMBULATORY_CARE_PROVIDER_SITE_OTHER): Payer: Commercial Managed Care - HMO | Admitting: Interventional Cardiology

## 2015-03-19 VITALS — BP 94/50 | HR 83 | Ht 74.0 in | Wt 163.6 lb

## 2015-03-19 DIAGNOSIS — Z720 Tobacco use: Secondary | ICD-10-CM

## 2015-03-19 DIAGNOSIS — E785 Hyperlipidemia, unspecified: Secondary | ICD-10-CM

## 2015-03-19 DIAGNOSIS — I10 Essential (primary) hypertension: Secondary | ICD-10-CM | POA: Diagnosis not present

## 2015-03-19 DIAGNOSIS — I5022 Chronic systolic (congestive) heart failure: Secondary | ICD-10-CM | POA: Diagnosis not present

## 2015-03-19 DIAGNOSIS — N182 Chronic kidney disease, stage 2 (mild): Secondary | ICD-10-CM

## 2015-03-19 DIAGNOSIS — I25810 Atherosclerosis of coronary artery bypass graft(s) without angina pectoris: Secondary | ICD-10-CM | POA: Diagnosis not present

## 2015-03-19 MED ORDER — CARVEDILOL 3.125 MG PO TABS: 3.1250 mg | ORAL_TABLET | Freq: Two times a day (BID) | ORAL | Status: DC

## 2015-03-19 NOTE — Patient Instructions (Signed)
Your physician has recommended you make the following change in your medication:  1.) DECREASE COREG TO 3.125 MG [place] physician has requested that you have an echocardiogram. Echocardiography is a painless test that uses sound waves to create images of your heart. It provides your doctor with information about the size and shape of your heart and how well your heart's chambers and valves are working. This procedure takes approximately one hour. There are no restrictions for this procedure.  Your physician wants you to follow-up in: [place].  You will receive a reminder letter in the mail two months in advance. If you don't receive a letter, please call our office to schedule the follow-up appointment.

## 2015-03-19 NOTE — Progress Notes (Signed)
Cardiology Office Note   Date:  03/19/2015   ID:  Mario [name], DOB 15-Aug-1940, MRN [id number]  PCP:  Kandice Hams, MD  Cardiologist:  Sinclair Grooms, MD   Chief Complaint  Patient presents with  . Coronary Artery Disease      History of Present Illness: Mario [name] is a 74 y.o. male who presents for coronary artery disease, recent coronary bypass grafting, peripheral arterial disease, hyperlipidemia, diabetes mellitus, essential hypertension, and combined systolic and diastolic heart failure.  The patient is now several months post coronary bypass grafting. He has been anticipating in cardiac rehabilitation and increasing his endurance. He denies angina. EF prior to surgery by nuclear was 34%. By cath was 40%. At cardiac rehabilitation. Constantly runs relatively  low blood pressures. The low blood pressures asymptomatic. The low blood pressures aggravated by beta blocker and ACE inhibitor therapy for LV systolic dysfunction. ACE inhibitor therapy also helps with kidney protection for diabetes. He denies orthopnea, PND, and angina has completely resolved.  Past Medical History  Diagnosis Date  . Essential hypertension, benign   . HLD (hyperlipidemia)   . PAD (peripheral artery disease) (HCC)     Dr. Fletcher Anon  . Orthostasis   . CAD (coronary artery disease)     a.  s/p IMI and BMS 1997;  b. Myoview 4/16:  inf scar with peri-infarct ischemia, EF 34%; high risk;  c. LHC 5/16:  3 v CAD >> CABG (free L-LAD, S-OM, S-dRCA)  . Gout   . Pneumonia     hx of  . Chronic kidney disease, stage II (mild)   . Arthritis   . Type II or unspecified type diabetes mellitus without mention of complication, uncontrolled   . Ischemic cardiomyopathy     a. EF by myoview 4/16 34%; b. LHC 5/16: EF 40-45%;  c. intraop TEE 6/16: EF 45-50%  . Chronic systolic CHF (congestive heart failure) ([place])   . Carotid stenosis     a. carotid US 6/16:  bilat ICA 1-39%    Past Surgical History    Procedure Laterality Date  . Hernia repair    . Cholecystectomy    . Cataract extraction    . Melanoma excision    . Skin lesion excision      multiple  . Abdominal aortagram N/A 10/09/2013    Procedure: ABDOMINAL Maxcine Ham;  Surgeon: Wellington Hampshire, MD;  Location: [facility] CATH LAB;  Service: Cardiovascular;  Laterality: N/A;  . Cardiac catheterization N/A 10/02/2014    Procedure: Left Heart Cath and Coronary Angiography;  Surgeon: Belva Crome, MD;  Location: [place] CV LAB;  Service: Cardiovascular;  Laterality: N/A;  . Colonoscopy    . Coronary artery bypass graft N/A 11/03/2014    Procedure: CORONARY ARTERY BYPASS GRAFTING  times three using left internal mammary and right greater saphenous vein;  Surgeon: Gaye Pollack, MD;  Location: [place] OR;  Service: Open Heart Surgery;  Laterality: N/A;  . Tee without cardioversion N/A 11/03/2014    Procedure: TRANSESOPHAGEAL ECHOCARDIOGRAM (TEE);  Surgeon: Gaye Pollack, MD;  Location: [place];  Service: Open Heart Surgery;  Laterality: N/A;     Current Outpatient Prescriptions  Medication Sig Dispense Refill  . ACCU-CHEK FASTCLIX LANCETS MISC Use to check blood sugar 2 times per day dx code E11.65 204 each 1  . acetaminophen (TYLENOL) 500 MG tablet Take 500 mg by mouth daily.     Marland Kitchen allopurinol (ZYLOPRIM) 100 MG tablet Take 1 tablet (100 mg  total) by mouth daily. 90 tablet 1  . aspirin EC 325 MG EC tablet Take 1 tablet (325 mg total) by mouth daily. 30 tablet 0  . atorvastatin (LIPITOR) 40 MG tablet Take 1 tablet (40 mg total) by mouth daily at 6 PM. 90 tablet 1  . Blood Glucose Calibration (ACCU-CHEK SMARTVIEW CONTROL) LIQD Use as directed 1 each 1  . Blood Glucose Monitoring Suppl (ACCU-CHEK AVIVA PLUS) W/DEVICE KIT USE TO CHECK BLOOD SUGAR 2 TIMES PER DAY  1 kit 0  . Bromocriptine Mesylate 0.8 MG TABS Take 4 mg by mouth daily.     . cholecalciferol (VITAMIN D) 1000 UNITS tablet Take 2,000 Units by mouth daily.    Marland Kitchen Co-Enzyme Q-10 100 MG CAPS  Take 100 mg by mouth daily.    . ferrous sulfate 325 (65 FE) MG tablet Take 325 mg by mouth daily.     . fluorouracil (EFUDEX) 5 % cream Apply 1 application topically daily as needed (skin irritations).     . folic acid (FOLVITE) 809 MCG tablet Take 800 mcg by mouth daily.    Marland Kitchen glipiZIDE (GLUCOTROL XL) 10 MG 24 hr tablet Take 1 tablet (10 mg total) by mouth at bedtime. 90 tablet 1  . glucose blood (ACCU-CHEK AVIVA PLUS) test strip Use as instructed to check blood sugar 2 times per day dx code E11.65 200 each 1  . ipratropium-albuterol (DUONEB) 0.5-2.5 (3) MG/3ML SOLN Take 3 mLs by nebulization 2 (two) times daily. 360 mL 0  . lisinopril (PRINIVIL,ZESTRIL) 5 MG tablet Take 1 tablet (5 mg total) by mouth daily. 30 tablet 11  . metFORMIN (GLUCOPHAGE) 1000 MG tablet Take 1 tablet (1,000 mg total) by mouth 2 (two) times daily with a meal. 180 tablet 3  . miglitol (GLYSET) 50 MG tablet Take 1 tablet (50 mg total) by mouth 2 (two) times daily. 180 tablet 1  . Multiple Vitamins-Minerals (PRESERVISION AREDS 2) CAPS Take 2 capsules by mouth daily.    Marland Kitchen NITROSTAT 0.4 MG SL tablet PLACE 1 TABLET (0.4 MG TOTAL) UNDER THE TONGUE EVERY 5 (FIVE) MINUTES AS NEEDED.  3  . omega-3 acid ethyl esters (LOVAZA) 1 G capsule Take 1 g by mouth daily.    . sitaGLIPtin (JANUVIA) 100 MG tablet Take 1 tablet (100 mg total) by mouth daily. 30 tablet 5  . tamsulosin (FLOMAX) 0.4 MG CAPS capsule Take 1 capsule (0.4 mg total) by mouth daily. 30 capsule 0  . VICTOZA 18 MG/3ML SOPN Inject 0.2 mLs (1.2 mg total) into the skin daily. Inject once daily at the same time 2 pen 3  . vitamin B-12 (CYANOCOBALAMIN) 500 MCG tablet Take 1,000 mcg by mouth daily.    . vitamin E 100 UNIT capsule Take 100 Units by mouth daily.    . carvedilol (COREG) 3.125 MG tablet Take 1 tablet (3.125 mg total) by mouth 2 (two) times daily. 180 tablet 3   No current facility-administered medications for this visit.    Allergies:   Penicillins; Plavix; and  Pletal    Social History:  The patient  reports that he quit smoking about 4 months ago. His smoking use included Cigarettes. He has a 25 pack-year smoking history. He has never used smokeless tobacco. He reports that he does not drink alcohol or use illicit drugs.   Family History:  The patient's family history includes Colon cancer in his father; Heart disease in his mother.    ROS:  Please see the history of present illness.  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 Otherwise he has no complaints. He has discontinued alcohol use..   All other systems are reviewed and negative.    PHYSICAL EXAM: VS:  BP 94/50 mmHg  Pulse 83  Ht 6' 2"  (1.88 m)  Wt 74.208 kg (163 lb 9.6 oz)  BMI 21.00 kg/m2  SpO2 98% , BMI Body mass index is 21 kg/(m^2). GEN: Well nourished, well developed, in no acute distress HEENT: normal Neck: no JVD, carotid bruits, or masses Cardiac: RRR.  There is no murmur, rub, or gallop. There is no edema. Respiratory:  clear to auscultation bilaterally, normal work of breathing. GI: soft, nontender, nondistended, + BS MS: no deformity or atrophy Skin: warm and dry, no rash Neuro:  Strength and sensation are intact Psych: euthymic mood, full affect   EKG:  EKG is not ordered today.   Recent Labs: 11/04/2014: Magnesium 2.2 11/05/2014: Hemoglobin 10.1*; Platelets 112* 11/26/2014: ALT 55* 02/26/2015: BUN 31*; Creatinine, Ser 1.08; Potassium 4.6; Sodium 141    Lipid Panel    Component Value Date/Time   CHOL 116 02/26/2015 0957   TRIG 96.0 02/26/2015 0957   HDL 38.80* 02/26/2015 0957   CHOLHDL 3 02/26/2015 0957   VLDL 19.2 02/26/2015 0957   LDLCALC 58 02/26/2015 0957      Wt Readings from Last 3 Encounters:  03/19/15 74.208 kg (163 lb 9.6 oz)  03/03/15 75.297 kg (166 lb)  01/14/15 72.576 kg (160 lb)      Other studies Reviewed: Additional studies/  records that were reviewed today include: Cardiac rehabilitation reports. The findings include documents relatively low blood pressures with systolic less than 672 frequently..    ASSESSMENT AND PLAN:  1. Coronary artery disease involving coronary bypass graft of native heart without angina pectoris Asymptomatic  2. Hyperlipidemia Point therapy and followed by primary care  3. Chronic systolic CHF (congestive heart failure) ([place]) He has not been revascularized. He has no symptoms of heart failure. LV function needs to be reassessed. - Echocardiogram; Future  4. Essential hypertension, benign Relatively low blood pressures in the setting of chronic hypertension.  5. Tobacco use Discontinued  6. Chronic kidney disease, stage II (mild) Relatively stable    Current medicines are reviewed at length with the patient today.  The patient has the following concerns regarding medicines: Reviewed all medications. Hypotension is being aggravated by beta blocker therapy and ACE inhibitor therapy..  The following changes/actions have been instituted:    Decrease carvedilol to 3.125 mg twice a day  2-D Doppler echocardiogram to reassess LV function. If systolic function is normalized, consider discontinuing beta blocker therapy.  Labs/ tests ordered today include:   Orders Placed This Encounter  Procedures  . Echocardiogram     Disposition:   FU with HS in 6 months  Signed, Sinclair Grooms, MD  03/19/2015 11:02 AM    [facility] [address], [place], [place]  [address] Phone: [phone]; Fax: [phone]

## 2015-03-20 ENCOUNTER — Encounter (HOSPITAL_COMMUNITY)
Admission: RE | Admit: 2015-03-20 | Discharge: 2015-03-20 | Disposition: A | Discharge status: Home / Self Care | Payer: Commercial Managed Care - HMO | Source: Ambulatory Visit | Attending: Interventional Cardiology | Admitting: Interventional Cardiology

## 2015-03-20 DIAGNOSIS — Z48812 Encounter for surgical aftercare following surgery on the circulatory system: Secondary | ICD-10-CM | POA: Diagnosis not present

## 2015-03-23 ENCOUNTER — Encounter (HOSPITAL_COMMUNITY)
Admission: RE | Admit: 2015-03-23 | Discharge: 2015-03-23 | Disposition: A | Discharge status: Home / Self Care | Payer: Commercial Managed Care - HMO | Source: Ambulatory Visit | Attending: Interventional Cardiology | Admitting: Interventional Cardiology

## 2015-03-23 DIAGNOSIS — Z48812 Encounter for surgical aftercare following surgery on the circulatory system: Secondary | ICD-10-CM | POA: Diagnosis not present

## 2015-03-23 NOTE — Progress Notes (Signed)
Mario [name] 74 y.o. male Nutrition Note Spoke with pt. Nutrition Plan and Nutrition Survey goals reviewed with pt. Pt is not currently following the Therapeutic Lifestyle Changes diet. Pt wants to gain wt. Pt UBW per EMR appears to range from 170-175 lb. Pt has been trying to gain wt by "eating ice cream." Wt gain tips reviewed. Pt is diabetic. Pt recently started on Victoza. Pt has tolerated 0.6 of Victoza but c/o diarrhea lasting several days after Victoza increased to 1.2. Pt is currently taking 0.6 of the Victoza every day at 5 pm. Last A1c indicates blood glucose not optimally controlled. This Probation officer went over Diabetes Education test results. Pt checks CBG's 1-2 times a day. Fasting and post-prandial CBG's reportedly 120-146 mg/dL. Pt with dx of CHF. Per discussion, pt does not use canned/convenience foods often and eats out infrequently. Pt rarely adds salt to food. Pt expressed understanding of the information reviewed. Pt aware of nutrition education classes offered. Vitals - 1 value per visit 03/19/2015 03/03/2015 01/14/2015 01/08/2015  Weight (lb) 163.6 166 160 159.17   Vitals - 1 value per visit 12/25/2014 12/08/2014 12/01/2014 11/10/2014 11/03/2014  Weight (lb) 156.12 159.5 160.4 164.9    Vitals - 1 value per visit 10/30/2014 10/21/2014 10/07/2014 10/02/2014 09/23/2014  Weight (lb) 167.2 167.8 170 170 169.12   Vitals - 1 value per visit 09/09/2014 09/01/2014 08/29/2014 07/24/2014 06/02/2014  Weight (lb) 167 172 171.8 169.7 178.6    Lab Results  Component Value Date   HGBA1C 7.8* 02/26/2015   Lab Results  Component Value Date   CHOL 116 02/26/2015   HDL 38.80* 02/26/2015   LDLCALC 58 02/26/2015   TRIG 96.0 02/26/2015   CHOLHDL 3 02/26/2015   Nutrition Diagnosis ? Food-and nutrition-related knowledge deficit related to lack of exposure to information as related to diagnosis of: ? CVD ? DM  Nutrition RX/ Estimated Daily Nutrition Needs for: wt gain 2800-3300 Kcal, 75-90 gm fat, 18-21 gm sat fat,  2.7-2.3 gm trans-fat, <1500 mg sodium, 300-450 gm CHO   Nutrition Intervention ? Pt's individual nutrition plan reviewed with pt. ? Benefits of adopting Therapeutic Lifestyle Changes discussed when Medficts reviewed. ? Pt to attend the Portion Distortion class ? Pt to attend the Diabetes Q & A class ? Pt to attend the   ? Nutrition I class                  ? Nutrition II class     ? Diabetes Blitz class    ? Pt given handouts for: ? High Calorie, High Protein diet ? Nutrition I class ? Nutrition II class ? Diabetes Blitz Class ? Continue client-centered nutrition education by RD, as part of interdisciplinary care. Goal(s) ? Pt to identify food quantities necessary to achieve: ? wt gain to a goal wt of 170-175 lb (77.3-79.5 kg) at graduation from cardiac rehab.  ? CBG concentrations in the normal range or as close to normal as is safely possible. Monitor and Evaluate progress toward nutrition goal with team. Nutrition Risk: Change to Moderate Derek Mound, M.Ed, RD, LDN, CDE 03/23/2015 12:15 PM

## 2015-03-24 ENCOUNTER — Encounter: Payer: Self-pay | Admitting: Endocrinology

## 2015-03-24 ENCOUNTER — Ambulatory Visit (INDEPENDENT_AMBULATORY_CARE_PROVIDER_SITE_OTHER): Payer: Commercial Managed Care - HMO | Admitting: Endocrinology

## 2015-03-24 VITALS — BP 120/66 | HR 88 | Temp 97.4°F | Wt 161.2 lb

## 2015-03-24 DIAGNOSIS — E1165 Type 2 diabetes mellitus with hyperglycemia: Secondary | ICD-10-CM | POA: Diagnosis not present

## 2015-03-24 MED ORDER — INSULIN PEN NEEDLE 31G X 8 MM MISC: Status: DC

## 2015-03-24 NOTE — Progress Notes (Signed)
Patient ID: Mario [name], male   DOB: 1940/06/23, 74 y.o.   MRN: [id number]   Reason for Appointment: Diabetes follow-up   History of Present Illness   Diagnosis: Type 2 DIABETES MELITUS, date of diagnosis:  1989     Previous history: He was initially diagnosed when hospitalized for pancreatitis and his previous endocrinologist had treated him with multiple medications because of progression of his diabetes. He was also put on Cycloset  which he has tolerated maximum dose He has had adjustments of his medication dosages the last couple of years and Januvia was restarted in 5/14 when blood sugars were higher. Dosages have been adjusted based on renal function Previously an A1c levels have been ranging from 6.6-7.5 His metformin was increased  when renal function was better and glipizide was changed to glipizide ER 10 mg. Did not appear to have better blood sugar control with Invokana  Recent history:  His A1c was higher than usual and nearly 8% in 10/16 despite his multiple drug regimen He was started on Victoza for improved control and  asked him to titrate the dose after one week to 1.2 mg He says that when he took the 1.2 mg dosage he had diarrhea for 2 days and subsequently reduced it back to 0.6 mg  Blood sugar patterns and problems:  He has not brought his monitor for download today again  FASTING blood sugars are improving since he started Victoza compared to readings of 150-200 previously  Again he has not checked readings after meals as directed and has only one or 2 readings in the afternoon which were not significantly high.  He has not stopped his Januvia as directed and just ran out of his Cycloset today Occasionally may forget to take Glyset at the start of the meal  Oral hypoglycemic drugs: Januvia, Glyset, glipizide ER 10 mg , Metformin 1 g twice a day, Cycloset         Side effects from medications: None Proper timing of medications in relation to meals:  usually  Monitors blood glucose: Once a day.    Glucometer:   Accu-Chek         Blood Glucose readings from recall: Fasting readings 130-140, today 180 with eating Halloween candy last night. pcl 165 once.  No readings after supper  Hypoglycemia: None recently      Meals: 2-3 meals per day, no lunch often; breakfast is cereal, [place] bacon, sometimes bread and juice at breakfast, sweets     Dietician 3/16       Physical activity: exercise: Rehab 2/7, going to the gym 1/7,      Wt Readings from Last 3 Encounters:  03/24/15 161 lb 4 oz (73.143 kg)  03/19/15 163 lb 9.6 oz (74.208 kg)  03/03/15 166 lb (75.297 kg)    Lab Results  Component Value Date   HGBA1C 7.8* 02/26/2015   HGBA1C 7.3* 11/26/2014   HGBA1C 7.2* 10/30/2014   Lab Results  Component Value Date   MICROALBUR 14.7* 02/26/2015   LDLCALC 58 02/26/2015   CREATININE 1.08 02/26/2015     LABS:  No visits with results within 1 Week(s) from this visit. Latest known visit with results is:  Lab on 02/26/2015  Component Date Value Ref Range Status  . Hgb A1c MFr Bld 02/26/2015 7.8* 4.6 - 6.5 % Final   Glycemic Control Guidelines for People with Diabetes:Non Diabetic:  <6%Goal of Therapy: <7%Additional Action Suggested:  >8%   . Sodium 02/26/2015 141  135 - 145 mEq/L Final  . Potassium 02/26/2015 4.6  3.5 - 5.1 mEq/L Final  . Chloride 02/26/2015 111  96 - 112 mEq/L Final  . CO2 02/26/2015 24  19 - 32 mEq/L Final  . Glucose, Bld 02/26/2015 181* 70 - 99 mg/dL Final  . BUN 02/26/2015 31* 6 - 23 mg/dL Final  . Creatinine, Ser 02/26/2015 1.08  0.40 - 1.50 mg/dL Final  . Calcium 02/26/2015 9.2  8.4 - 10.5 mg/dL Final  . GFR 02/26/2015 71.06  >60.00 mL/min Final  . Cholesterol 02/26/2015 116  0 - 200 mg/dL Final   ATP III Classification       Desirable:  < 200 mg/dL               Borderline High:  200 - 239 mg/dL          High:  > = 240 mg/dL  . Triglycerides 02/26/2015 96.0  0.0 - 149.0 mg/dL Final   Normal:  <150  mg/dLBorderline High:  150 - 199 mg/dL  . HDL 02/26/2015 38.80* >39.00 mg/dL Final  . VLDL 02/26/2015 19.2  0.0 - 40.0 mg/dL Final  . LDL Cholesterol 02/26/2015 58  0 - 99 mg/dL Final  . Total CHOL/HDL Ratio 02/26/2015 3   Final                  Men          Women1/2 Average Risk     3.4          3.3Average Risk          5.0          4.42X Average Risk          9.6          7.13X Average Risk          15.0          11.0                      . NonHDL 02/26/2015 77.44   Final   NOTE:  Non-HDL goal should be 30 mg/dL higher than patient's LDL goal (i.e. LDL goal of < 70 mg/dL, would have non-HDL goal of < 100 mg/dL)  . Microalb, Ur 02/26/2015 14.7* 0.0 - 1.9 mg/dL Final  . Creatinine,U 02/26/2015 129.6   Final  . Microalb Creat Ratio 02/26/2015 11.3  0.0 - 30.0 mg/g Final      Medication List       This list is accurate as of: 03/24/15  8:49 PM.  Always use your most recent med list.               ACCU-CHEK AVIVA PLUS W/DEVICE Kit  USE TO CHECK BLOOD SUGAR 2 TIMES PER DAY     ACCU-CHEK FASTCLIX LANCETS Misc  Use to check blood sugar 2 times per day dx code E11.65     ACCU-CHEK SMARTVIEW CONTROL Liqd  Use as directed     acetaminophen 500 MG tablet  Commonly known as:  TYLENOL  Take 500 mg by mouth daily as needed.     allopurinol 100 MG tablet  Commonly known as:  ZYLOPRIM  Take 1 tablet (100 mg total) by mouth daily.     aspirin 325 MG EC tablet  Take 1 tablet (325 mg total) by mouth daily.     atorvastatin 40 MG tablet  Commonly known as:  LIPITOR  Take 1 tablet (40 mg total) by  mouth daily at 6 PM.     Bromocriptine Mesylate 0.8 MG Tabs  Take 4 mg by mouth daily.     carvedilol 3.125 MG tablet  Commonly known as:  COREG  Take 1 tablet (3.125 mg total) by mouth 2 (two) times daily.     cholecalciferol 1000 UNITS tablet  Commonly known as:  VITAMIN D  Take 2,000 Units by mouth daily.     Co-Enzyme Q-10 100 MG Caps  Take 100 mg by mouth daily.     ferrous  sulfate 325 (65 FE) MG tablet  Take 325 mg by mouth daily.     fluorouracil 5 % cream  Commonly known as:  EFUDEX  Apply 1 application topically daily as needed (skin irritations).     folic acid 619 MCG tablet  Commonly known as:  FOLVITE  Take 800 mcg by mouth daily.     glipiZIDE 10 MG 24 hr tablet  Commonly known as:  GLUCOTROL XL  Take 1 tablet (10 mg total) by mouth at bedtime.     glucose blood test strip  Commonly known as:  ACCU-CHEK AVIVA PLUS  Use as instructed to check blood sugar 2 times per day dx code E11.65     Insulin Pen Needle 31G X 8 MM Misc  Use daily as instructed Dx E11.65     ipratropium-albuterol 0.5-2.5 (3) MG/3ML Soln  Commonly known as:  DUONEB  Take 3 mLs by nebulization 2 (two) times daily.     lisinopril 5 MG tablet  Commonly known as:  PRINIVIL,ZESTRIL  Take 1 tablet (5 mg total) by mouth daily.     metFORMIN 1000 MG tablet  Commonly known as:  GLUCOPHAGE  Take 1 tablet (1,000 mg total) by mouth 2 (two) times daily with a meal.     miglitol 50 MG tablet  Commonly known as:  GLYSET  Take 1 tablet (50 mg total) by mouth 2 (two) times daily.     NITROSTAT 0.4 MG SL tablet  Generic drug:  nitroGLYCERIN  PLACE 1 TABLET (0.4 MG TOTAL) UNDER THE TONGUE EVERY 5 (FIVE) MINUTES AS NEEDED.     omega-3 acid ethyl esters 1 G capsule  Commonly known as:  LOVAZA  Take 1 g by mouth daily.     PRESERVISION AREDS 2 Caps  Take 2 capsules by mouth daily.     tamsulosin 0.4 MG Caps capsule  Commonly known as:  FLOMAX  Take 1 capsule (0.4 mg total) by mouth daily.     VICTOZA 18 MG/3ML Sopn  Generic drug:  Liraglutide  Inject 0.2 mLs (1.2 mg total) into the skin daily. Inject once daily at the same time     vitamin B-12 500 MCG tablet  Commonly known as:  CYANOCOBALAMIN  Take 1,000 mcg by mouth daily.     vitamin E 100 UNIT capsule  Take 100 Units by mouth daily.        Allergies:  Allergies  Allergen Reactions  . Penicillins Anaphylaxis   . Plavix [Clopidogrel Bisulfate] Hives  . Pletal [Cilostazol] Itching    Past Medical History  Diagnosis Date  . Essential hypertension, benign   . HLD (hyperlipidemia)   . PAD (peripheral artery disease) (HCC)     Dr. Fletcher Anon  . Orthostasis   . CAD (coronary artery disease)     a.  s/p IMI and BMS 1997;  b. Myoview 4/16:  inf scar with peri-infarct ischemia, EF 34%; high risk;  c. LHC 5/16:  3 v CAD >> CABG (free L-LAD, S-OM, S-dRCA)  . Gout   . Pneumonia     hx of  . Chronic kidney disease, stage II (mild)   . Arthritis   . Type II or unspecified type diabetes mellitus without mention of complication, uncontrolled   . Ischemic cardiomyopathy     a. EF by myoview 4/16 34%; b. LHC 5/16: EF 40-45%;  c. intraop TEE 6/16: EF 45-50%  . Chronic systolic CHF (congestive heart failure) ([place])   . Carotid stenosis     a. carotid US 6/16:  bilat ICA 1-39%    Past Surgical History  Procedure Laterality Date  . Hernia repair    . Cholecystectomy    . Cataract extraction    . Melanoma excision    . Skin lesion excision      multiple  . Abdominal aortagram N/A 10/09/2013    Procedure: ABDOMINAL Maxcine Ham;  Surgeon: Wellington Hampshire, MD;  Location: [facility] CATH LAB;  Service: Cardiovascular;  Laterality: N/A;  . Cardiac catheterization N/A 10/02/2014    Procedure: Left Heart Cath and Coronary Angiography;  Surgeon: Belva Crome, MD;  Location: [facility] CV LAB;  Service: Cardiovascular;  Laterality: N/A;  . Colonoscopy    . Coronary artery bypass graft N/A 11/03/2014    Procedure: CORONARY ARTERY BYPASS GRAFTING  times three using left internal mammary and right greater saphenous vein;  Surgeon: Gaye Pollack, MD;  Location: [facility] OR;  Service: Open Heart Surgery;  Laterality: N/A;  . Tee without cardioversion N/A 11/03/2014    Procedure: TRANSESOPHAGEAL ECHOCARDIOGRAM (TEE);  Surgeon: Gaye Pollack, MD;  Location: [facility];  Service: Open Heart Surgery;  Laterality: N/A;    Family History    Problem Relation Age of Onset  . Colon cancer Father   . Heart disease Mother     Social History:  reports that he quit smoking about 5 months ago. His smoking use included Cigarettes. He has a 25 pack-year smoking history. He has never used smokeless tobacco. He reports that he does not drink alcohol or use illicit drugs.  Review of Systems:  The following is a copy of previous note  Hypertension:  currently taking lisinopril and carvedilol and blood pressure is well-controlled  LIVER action abnormality: Not stated this has been evaluated by his PCP  Lab Results  Component Value Date   ALT 55* 11/26/2014    Lipids: Well controlled with atorvastatin.  HDL is  persistently low, triglycerides normal   Lab Results  Component Value Date   CHOL 116 02/26/2015   HDL 38.80* 02/26/2015   LDLCALC 58 02/26/2015   TRIG 96.0 02/26/2015   CHOLHDL 3 02/26/2015   Able to exercise at cardiac rehabilitation now along with some exercises at home without difficulty  Foot exam done in 7/16, has callus formation on the left and significant sensory loss as well as decreased pulses  History of BPH present, taking Flomax   Examination:   BP 120/66 mmHg  Pulse 88  Temp(Src) 97.4 F (36.3 C) (Oral)  Wt 161 lb 4 oz (73.143 kg)  Body mass index is 20.69 kg/(m^2).   No pedal edema  ASSESSMENT/ PLAN:   Diabetes type 2   Blood glucose control is improving with starting Victoza even with using 0.6 mg He appears to had diarrhea when he try taking 1.2 mg Since he does not check readings after meals and did not bring his monitor not sure if his control is adequate  at this time but he is benefiting from the low dose Victoza He has no difficulty doing injection and does not mind doing this  For now he will try to use 5 clicks beyond the 0.6 mg dose to see if he tolerates this better. May stop Januvia and Cycloset  Encouraged him to check readings after meals more and be compliant with bring  his monitor for download on each visit  Patient Instructions  Check blood sugars on waking up 3-4  times a week Also check blood sugars about 2 hours after a meal and do this after different meals by rotation  Recommended blood sugar levels on waking up is 90-130 and about 2 hours after meal is 130-160  Please bring your blood sugar monitor to each visit, thank you  Stop Cycloset and Januvia  If am sugar stays over 150 in am or 200 after meals then try 0.9 Victoza daily      Mario [name] 03/24/2015, 8:49 PM

## 2015-03-24 NOTE — Patient Instructions (Addendum)
Check blood sugars on waking up 3-4  times a week Also check blood sugars about 2 hours after a meal and do this after different meals by rotation  Recommended blood sugar levels on waking up is 90-130 and about 2 hours after meal is 130-160  Please bring your blood sugar monitor to each visit, thank you  Stop Cycloset and Januvia  If am sugar stays over 150 in am or 200 after meals then try 0.9 Victoza daily

## 2015-03-26 ENCOUNTER — Ambulatory Visit (HOSPITAL_COMMUNITY): Payer: Commercial Managed Care - HMO | Attending: Interventional Cardiology

## 2015-03-26 ENCOUNTER — Other Ambulatory Visit: Payer: Self-pay

## 2015-03-26 DIAGNOSIS — I5022 Chronic systolic (congestive) heart failure: Secondary | ICD-10-CM | POA: Diagnosis not present

## 2015-03-26 DIAGNOSIS — Z951 Presence of aortocoronary bypass graft: Secondary | ICD-10-CM | POA: Insufficient documentation

## 2015-03-26 DIAGNOSIS — E119 Type 2 diabetes mellitus without complications: Secondary | ICD-10-CM | POA: Diagnosis not present

## 2015-03-26 DIAGNOSIS — E785 Hyperlipidemia, unspecified: Secondary | ICD-10-CM | POA: Insufficient documentation

## 2015-03-26 DIAGNOSIS — I517 Cardiomegaly: Secondary | ICD-10-CM | POA: Diagnosis not present

## 2015-03-26 DIAGNOSIS — N189 Chronic kidney disease, unspecified: Secondary | ICD-10-CM | POA: Diagnosis not present

## 2015-03-26 DIAGNOSIS — I340 Nonrheumatic mitral (valve) insufficiency: Secondary | ICD-10-CM | POA: Insufficient documentation

## 2015-03-26 DIAGNOSIS — I129 Hypertensive chronic kidney disease with stage 1 through stage 4 chronic kidney disease, or unspecified chronic kidney disease: Secondary | ICD-10-CM | POA: Diagnosis not present

## 2015-03-27 ENCOUNTER — Encounter (HOSPITAL_COMMUNITY)
Admission: RE | Admit: 2015-03-27 | Discharge: 2015-03-27 | Disposition: A | Discharge status: Home / Self Care | Payer: Commercial Managed Care - HMO | Source: Ambulatory Visit | Attending: Interventional Cardiology | Admitting: Interventional Cardiology

## 2015-03-27 DIAGNOSIS — Z48812 Encounter for surgical aftercare following surgery on the circulatory system: Secondary | ICD-10-CM | POA: Insufficient documentation

## 2015-03-27 DIAGNOSIS — Z951 Presence of aortocoronary bypass graft: Secondary | ICD-10-CM | POA: Diagnosis not present

## 2015-03-30 ENCOUNTER — Encounter (HOSPITAL_COMMUNITY)
Admission: RE | Admit: 2015-03-30 | Discharge: 2015-03-30 | Disposition: A | Discharge status: Home / Self Care | Payer: Commercial Managed Care - HMO | Source: Ambulatory Visit | Attending: Interventional Cardiology | Admitting: Interventional Cardiology

## 2015-03-30 DIAGNOSIS — Z48812 Encounter for surgical aftercare following surgery on the circulatory system: Secondary | ICD-10-CM | POA: Diagnosis not present

## 2015-03-30 LAB — GLUCOSE, CAPILLARY: Glucose-Capillary: 290 mg/dL — ABNORMAL HIGH (ref 65–99)

## 2015-03-31 ENCOUNTER — Telehealth: Payer: Self-pay

## 2015-03-31 NOTE — Telephone Encounter (Signed)
-----   Message from Belva Crome, MD sent at 03/27/2015  1:48 PM EDT ----- Heart function now normal

## 2015-03-31 NOTE — Telephone Encounter (Signed)
Called to give pt echo results.lmtcb 

## 2015-04-02 NOTE — Telephone Encounter (Signed)
Pt aware of echo results with verbal understanding. 

## 2015-04-03 ENCOUNTER — Encounter (HOSPITAL_COMMUNITY)
Admission: RE | Admit: 2015-04-03 | Discharge: 2015-04-03 | Disposition: A | Discharge status: Home / Self Care | Payer: Commercial Managed Care - HMO | Source: Ambulatory Visit | Attending: Interventional Cardiology | Admitting: Interventional Cardiology

## 2015-04-03 DIAGNOSIS — Z48812 Encounter for surgical aftercare following surgery on the circulatory system: Secondary | ICD-10-CM | POA: Diagnosis not present

## 2015-04-06 ENCOUNTER — Encounter (HOSPITAL_COMMUNITY)
Admission: RE | Admit: 2015-04-06 | Discharge: 2015-04-06 | Disposition: A | Discharge status: Home / Self Care | Payer: Commercial Managed Care - HMO | Source: Ambulatory Visit | Attending: Interventional Cardiology | Admitting: Interventional Cardiology

## 2015-04-06 DIAGNOSIS — Z48812 Encounter for surgical aftercare following surgery on the circulatory system: Secondary | ICD-10-CM | POA: Diagnosis not present

## 2015-04-10 ENCOUNTER — Encounter (HOSPITAL_COMMUNITY)
Admission: RE | Admit: 2015-04-10 | Discharge: 2015-04-10 | Disposition: A | Discharge status: Home / Self Care | Payer: Commercial Managed Care - HMO | Source: Ambulatory Visit | Attending: Interventional Cardiology | Admitting: Interventional Cardiology

## 2015-04-10 DIAGNOSIS — Z48812 Encounter for surgical aftercare following surgery on the circulatory system: Secondary | ICD-10-CM | POA: Diagnosis not present

## 2015-04-10 LAB — GLUCOSE, CAPILLARY: Glucose-Capillary: 189 mg/dL — ABNORMAL HIGH (ref 65–99)

## 2015-04-13 ENCOUNTER — Encounter (HOSPITAL_COMMUNITY)
Admission: RE | Admit: 2015-04-13 | Discharge: 2015-04-13 | Disposition: A | Discharge status: Home / Self Care | Payer: Commercial Managed Care - HMO | Source: Ambulatory Visit | Attending: Interventional Cardiology | Admitting: Interventional Cardiology

## 2015-04-13 DIAGNOSIS — Z48812 Encounter for surgical aftercare following surgery on the circulatory system: Secondary | ICD-10-CM | POA: Diagnosis not present

## 2015-04-15 ENCOUNTER — Ambulatory Visit: Payer: Commercial Managed Care - HMO | Admitting: Surgery

## 2015-04-15 ENCOUNTER — Other Ambulatory Visit: Payer: Self-pay | Admitting: Endocrinology

## 2015-04-20 ENCOUNTER — Encounter (HOSPITAL_COMMUNITY)
Admission: RE | Admit: 2015-04-20 | Discharge: 2015-04-20 | Disposition: A | Discharge status: Home / Self Care | Payer: Commercial Managed Care - HMO | Source: Ambulatory Visit | Attending: Interventional Cardiology | Admitting: Interventional Cardiology

## 2015-04-20 DIAGNOSIS — Z48812 Encounter for surgical aftercare following surgery on the circulatory system: Secondary | ICD-10-CM | POA: Diagnosis not present

## 2015-04-20 LAB — GLUCOSE, CAPILLARY: Glucose-Capillary: 197 mg/dL — ABNORMAL HIGH (ref 65–99)

## 2015-04-21 ENCOUNTER — Other Ambulatory Visit: Payer: Self-pay | Admitting: Endocrinology

## 2015-04-24 ENCOUNTER — Encounter (HOSPITAL_COMMUNITY)
Admission: RE | Admit: 2015-04-24 | Discharge: 2015-04-24 | Disposition: A | Discharge status: Home / Self Care | Payer: Commercial Managed Care - HMO | Source: Ambulatory Visit | Attending: Interventional Cardiology | Admitting: Interventional Cardiology

## 2015-04-24 DIAGNOSIS — Z48812 Encounter for surgical aftercare following surgery on the circulatory system: Secondary | ICD-10-CM | POA: Insufficient documentation

## 2015-04-24 DIAGNOSIS — Z951 Presence of aortocoronary bypass graft: Secondary | ICD-10-CM | POA: Insufficient documentation

## 2015-04-27 ENCOUNTER — Encounter (HOSPITAL_COMMUNITY)
Admission: RE | Admit: 2015-04-27 | Discharge: 2015-04-27 | Disposition: A | Discharge status: Home / Self Care | Payer: Commercial Managed Care - HMO | Source: Ambulatory Visit | Attending: Interventional Cardiology | Admitting: Interventional Cardiology

## 2015-04-27 ENCOUNTER — Other Ambulatory Visit: Payer: Self-pay | Admitting: *Deleted

## 2015-04-27 ENCOUNTER — Telehealth: Payer: Self-pay | Admitting: Endocrinology

## 2015-04-27 DIAGNOSIS — Z48812 Encounter for surgical aftercare following surgery on the circulatory system: Secondary | ICD-10-CM | POA: Diagnosis not present

## 2015-04-27 MED ORDER — GLUCOSE BLOOD VI STRP: ORAL_STRIP | Status: DC

## 2015-04-27 NOTE — Telephone Encounter (Signed)
Please call in the contour next test strips refill [facility] on [address].

## 2015-04-27 NOTE — Progress Notes (Signed)
Pt will  Graduated on 12/66fom cardiac rehab program today with completion of 30 exercise sessions in Phase II. Pt attended on Mondays and Fridays due working on Wednesdays delivering paper for Pt maintained fair attendance for 18 weeks [facility]. Pt progressed nicely during his participation in rehab as evidenced by increased MET level. Pt increased from MET level from 2.5 to 3.8.  Medication list reconciled. Repeat  PHQ score-0.  Pt has supportive wife and family.  Pt has a positive outlook on life and often time is observed helping other participants in rehab and encourages the newer patients who are in rehab. Pt has made significant lifestyle changes and should be commended for his success. Pt feels he has achieved his goals during cardiac rehab.  Pt is back to playing pickle ball.  Pt has not been back to tennis. The tennis group he plays with are not playing now and the center he plays is under repair.  Pt desires to return to swimming but has not due to being an outside pool and seasonal this not a good time.  However pt feels he can do these activities with ease. [facility] x [facility]. Pt is unsure of his accountability but his wife has encouraged him to come along with her when she goes.  The [facility] has a inside pool that he can swim in for exercise.  Pt given information on the maintenance program here in case he is finding it difficult to maintain consistency.   It was a pleasure to have this patient participate in cardiac rehab. CCherre Huger BSN

## 2015-04-27 NOTE — Telephone Encounter (Signed)
rx sent

## 2015-04-28 NOTE — Telephone Encounter (Signed)
Noted  

## 2015-04-28 NOTE — Telephone Encounter (Signed)
Informed pt of test strip script refill called in

## 2015-05-01 ENCOUNTER — Other Ambulatory Visit: Payer: Self-pay | Admitting: *Deleted

## 2015-05-01 ENCOUNTER — Telehealth: Payer: Self-pay | Admitting: Endocrinology

## 2015-05-01 ENCOUNTER — Other Ambulatory Visit (INDEPENDENT_AMBULATORY_CARE_PROVIDER_SITE_OTHER): Payer: Commercial Managed Care - HMO

## 2015-05-01 ENCOUNTER — Encounter (HOSPITAL_COMMUNITY)
Admission: RE | Admit: 2015-05-01 | Discharge: 2015-05-01 | Disposition: A | Discharge status: Home / Self Care | Payer: Commercial Managed Care - HMO | Source: Ambulatory Visit | Attending: Interventional Cardiology | Admitting: Interventional Cardiology

## 2015-05-01 ENCOUNTER — Other Ambulatory Visit: Payer: Commercial Managed Care - HMO

## 2015-05-01 DIAGNOSIS — E1165 Type 2 diabetes mellitus with hyperglycemia: Secondary | ICD-10-CM

## 2015-05-01 DIAGNOSIS — Z48812 Encounter for surgical aftercare following surgery on the circulatory system: Secondary | ICD-10-CM | POA: Diagnosis not present

## 2015-05-01 LAB — BASIC METABOLIC PANEL
BUN: 23 mg/dL (ref 6–23)
CO2: 23 mEq/L (ref 19–32)
Calcium: 9.2 mg/dL (ref 8.4–10.5)
Chloride: 108 mEq/L (ref 96–112)
Creatinine, Ser: 1.03 mg/dL (ref 0.40–1.50)
GFR: 75.02 mL/min (ref >60.00)
Glucose, Bld: 158 mg/dL — ABNORMAL HIGH (ref 70–99)
Potassium: 4.3 mEq/L (ref 3.5–5.1)
Sodium: 139 mEq/L (ref 135–145)

## 2015-05-01 MED ORDER — VICTOZA 18 MG/3ML ~~LOC~~ SOPN: 1.2000 mg | PEN_INJECTOR | Freq: Every day | SUBCUTANEOUS | Status: DC

## 2015-05-01 MED ORDER — MIGLITOL 50 MG PO TABS: ORAL_TABLET | ORAL | Status: DC

## 2015-05-01 MED ORDER — MIGLITOL 50 MG PO TABS: 50.0000 mg | ORAL_TABLET | Freq: Two times a day (BID) | ORAL | Status: DC

## 2015-05-01 NOTE — Telephone Encounter (Signed)
T K from [facility] called stated patient has been out of his medication Glyset 50 mg for 3 days, because his mail order  Prescription, haven't come yet, is it possible he could get enough meds until he receive his medication, please advise. He also need a refill of Victoza.Lovena Neighbours to [employer] [facility] [employer] Phone number [phone]

## 2015-05-01 NOTE — Telephone Encounter (Signed)
rx's have been sent to [facility]

## 2015-05-02 LAB — FRUCTOSAMINE: Fructosamine: 306 umol/L — ABNORMAL HIGH (ref 0–285)

## 2015-05-04 ENCOUNTER — Encounter: Payer: Self-pay | Admitting: Endocrinology

## 2015-05-04 ENCOUNTER — Ambulatory Visit (INDEPENDENT_AMBULATORY_CARE_PROVIDER_SITE_OTHER): Payer: Commercial Managed Care - HMO | Admitting: Endocrinology

## 2015-05-04 VITALS — BP 130/62 | HR 76 | Temp 97.5°F | Resp 14 | Ht 74.0 in | Wt 169.4 lb

## 2015-05-04 DIAGNOSIS — E118 Type 2 diabetes mellitus with unspecified complications: Secondary | ICD-10-CM

## 2015-05-04 DIAGNOSIS — E1165 Type 2 diabetes mellitus with hyperglycemia: Secondary | ICD-10-CM

## 2015-05-04 DIAGNOSIS — IMO0002 Reserved for concepts with insufficient information to code with codable children: Secondary | ICD-10-CM

## 2015-05-04 NOTE — Patient Instructions (Signed)
Glyset 1 at Bfst 1/2 at lunch, 1 at dinner  Metformin 1 twice daily  Check blood sugars on waking up   times a week Also check blood sugars about 2 hours after a meal and do this after different meals by rotation  Recommended blood sugar levels on waking up is 90-130 and about 2 hours after meal is 130-160  Please bring your blood sugar monitor to each visit, thank you

## 2015-05-04 NOTE — Progress Notes (Signed)
Patient ID: Mario [name], male   DOB: 1940/10/06, 74 y.o.   MRN: [id number]   Reason for Appointment: Diabetes follow-up   History of Present Illness   Diagnosis: Type 2 DIABETES MELITUS, date of diagnosis:  1989     Previous history: He was initially diagnosed when hospitalized for pancreatitis and his previous endocrinologist had treated him with multiple medications because of progression of his diabetes. He was also put on Cycloset  which he has tolerated maximum dose He has had adjustments of his medication dosages the last couple of years and Januvia was restarted in 5/14 when blood sugars were higher. Dosages have been adjusted based on renal function Previously an A1c levels have been ranging from 6.6-7.5 His metformin was increased  when renal function was better and glipizide was changed to glipizide ER 10 mg. Did not appear to have better blood sugar control with Invokana  Recent history:  His A1c was higher than usual and nearly 8% in 10/16 despite his multiple drug regimen He was started on Victoza for improved control and  recently has been able to tolerate 1.2 mg for the last 2 weeks without any diarrhea  Blood sugar patterns and problems:  FASTING blood sugars are mildly increased again but better since he started Victoza compared to readings of 150-200 previously  Recently has been taking only a half tablet of metformin in the evening  Also has been out of his Glyset since he has been not been able to get it from his mail-order or drugstore for 2 weeks  He has checked only a few readings after meals and only once after supper and these appear to be higher  He has been told by the dietitian at cardiac rehabilitation to increase his portions to gain weight.  Fructosamine indicates mildly increased readings overall  Occasionally may forget to take Glyset at the start of the meal and may take it right after  Oral hypoglycemic drugs: (Glyset), glipizide ER  10 mg , Metformin 1 g twice a day      Side effects from medications: None. Proper timing of medications in relation to meals: usually  Monitors blood glucose: Once a day.    Glucometer: Contour    Blood Glucose readings from   Mean values apply above for all meters except median for One Touch  PRE-MEAL Fasting Lunch Dinner Bedtime Overall  Glucose range:  132-180   151   181, 226, 110   225    Mean/median:  148      155    POST-MEAL PC Breakfast PC Lunch PC Dinner  Glucose range:     Mean/median:       Fasting readings 130-140, today 180 with eating Halloween candy last night. pcl 165 once.  No readings after supper  Hypoglycemia: None recently      Meals: 2-3 meals per day, no lunch often (yogurt); breakfast is cereal, [place] bacon, sometimes bread and juice at breakfast, sweets     Dietician 3/16       Physical activity: exercise:   going to the gym 1-3/7     Wt Readings from Last 3 Encounters:  05/04/15 169 lb 6.4 oz (76.839 kg)  03/24/15 161 lb 4 oz (73.143 kg)  03/19/15 163 lb 9.6 oz (74.208 kg)    Lab Results  Component Value Date   HGBA1C 7.8* 02/26/2015   HGBA1C 7.3* 11/26/2014   HGBA1C 7.2* 10/30/2014   Lab Results  Component Value Date  MICROALBUR 14.7* 02/26/2015   LDLCALC 58 02/26/2015   CREATININE 1.03 05/01/2015     LABS:  Lab on 05/01/2015  Component Date Value Ref Range Status  . Sodium 05/01/2015 139  135 - 145 mEq/L Final  . Potassium 05/01/2015 4.3  3.5 - 5.1 mEq/L Final  . Chloride 05/01/2015 108  96 - 112 mEq/L Final  . CO2 05/01/2015 23  19 - 32 mEq/L Final  . Glucose, Bld 05/01/2015 158* 70 - 99 mg/dL Final  . BUN 05/01/2015 23  6 - 23 mg/dL Final  . Creatinine, Ser 05/01/2015 1.03  0.40 - 1.50 mg/dL Final  . Calcium 05/01/2015 9.2  8.4 - 10.5 mg/dL Final  . GFR 05/01/2015 75.02  >60.00 mL/min Final  . Fructosamine 05/01/2015 306* 0 - 285 umol/L Final   Comment: Published reference interval for apparently healthy subjects between  age 42 and 53 is 25 - 285 umol/L and in a poorly controlled diabetic population is 228 - 563 umol/L with a mean of 396 umol/L.       Medication List       This list is accurate as of: 05/04/15 12:45 PM.  Always use your most recent med list.               ACCU-CHEK AVIVA PLUS W/DEVICE Kit  USE TO CHECK BLOOD SUGAR 2 TIMES PER DAY     ACCU-CHEK FASTCLIX LANCETS Misc  Use to check blood sugar 2 times per day dx code E11.65     ACCU-CHEK SMARTVIEW CONTROL Liqd  Use as directed     acetaminophen 500 MG tablet  Commonly known as:  TYLENOL  Take 500 mg by mouth daily as needed.     allopurinol 100 MG tablet  Commonly known as:  ZYLOPRIM  TAKE 1 TABLET EVERY DAY     aspirin 325 MG EC tablet  Take 1 tablet (325 mg total) by mouth daily.     atorvastatin 40 MG tablet  Commonly known as:  LIPITOR  TAKE 1 TABLET EVERY DAY AT  6 PM     Bromocriptine Mesylate 0.8 MG Tabs  Take 4 mg by mouth daily.     carvedilol 3.125 MG tablet  Commonly known as:  COREG  Take 1 tablet (3.125 mg total) by mouth 2 (two) times daily.     cholecalciferol 1000 UNITS tablet  Commonly known as:  VITAMIN D  Take 2,000 Units by mouth daily.     Co-Enzyme Q-10 100 MG Caps  Take 100 mg by mouth daily.     ferrous sulfate 325 (65 FE) MG tablet  Take 325 mg by mouth daily.     fluorouracil 5 % cream  Commonly known as:  EFUDEX  Apply 1 application topically daily as needed (skin irritations).     folic acid 811 MCG tablet  Commonly known as:  FOLVITE  Take 800 mcg by mouth daily.     glipiZIDE 10 MG 24 hr tablet  Commonly known as:  GLUCOTROL XL  TAKE 1 TABLET AT BEDTIME     glucose blood test strip  Commonly known as:  BAYER CONTOUR NEXT TEST  Use as instructed to check blood sugar 2 times per day dx code E11.65     Insulin Pen Needle 31G X 8 MM Misc  Use daily as instructed Dx E11.65     ipratropium-albuterol 0.5-2.5 (3) MG/3ML Soln  Commonly known as:  DUONEB  Take 3 mLs by  nebulization 2 (two) times daily.  lisinopril 5 MG tablet  Commonly known as:  PRINIVIL,ZESTRIL  Take 1 tablet (5 mg total) by mouth daily.     metFORMIN 1000 MG tablet  Commonly known as:  GLUCOPHAGE  Take 1 tablet (1,000 mg total) by mouth 2 (two) times daily with a meal.     miglitol 50 MG tablet  Commonly known as:  GLYSET  Take 1 tablet by mouth two times daily dx code E11.65     NITROSTAT 0.4 MG SL tablet  Generic drug:  nitroGLYCERIN  PLACE 1 TABLET (0.4 MG TOTAL) UNDER THE TONGUE EVERY 5 (FIVE) MINUTES AS NEEDED.     omega-3 acid ethyl esters 1 G capsule  Commonly known as:  LOVAZA  Take 1 g by mouth daily.     PRESERVISION AREDS 2 Caps  Take 2 capsules by mouth daily.     tamsulosin 0.4 MG Caps capsule  Commonly known as:  FLOMAX  Take 1 capsule (0.4 mg total) by mouth daily.     VICTOZA 18 MG/3ML Sopn  Generic drug:  Liraglutide  Inject 0.2 mLs (1.2 mg total) into the skin daily. Inject once daily at the same time     vitamin B-12 500 MCG tablet  Commonly known as:  CYANOCOBALAMIN  Take 1,000 mcg by mouth daily.     vitamin E 100 UNIT capsule  Take 100 Units by mouth daily.        Allergies:  Allergies  Allergen Reactions  . Penicillins Anaphylaxis  . Plavix [Clopidogrel Bisulfate] Hives  . Pletal [Cilostazol] Itching    Past Medical History  Diagnosis Date  . Essential hypertension, benign   . HLD (hyperlipidemia)   . PAD (peripheral artery disease) (HCC)     Dr. Fletcher Anon  . Orthostasis   . CAD (coronary artery disease)     a.  s/p IMI and BMS 1997;  b. Myoview 4/16:  inf scar with peri-infarct ischemia, EF 34%; high risk;  c. LHC 5/16:  3 v CAD >> CABG (free L-LAD, S-OM, S-dRCA)  . Gout   . Pneumonia     hx of  . Chronic kidney disease, stage II (mild)   . Arthritis   . Type II or unspecified type diabetes mellitus without mention of complication, uncontrolled   . Ischemic cardiomyopathy     a. EF by myoview 4/16 34%; b. LHC 5/16: EF  40-45%;  c. intraop TEE 6/16: EF 45-50%  . Chronic systolic CHF (congestive heart failure) ([place])   . Carotid stenosis     a. carotid US 6/16:  bilat ICA 1-39%    Past Surgical History  Procedure Laterality Date  . Hernia repair    . Cholecystectomy    . Cataract extraction    . Melanoma excision    . Skin lesion excision      multiple  . Abdominal aortagram N/A 10/09/2013    Procedure: ABDOMINAL Maxcine Ham;  Surgeon: Wellington Hampshire, MD;  Location: [facility] CATH LAB;  Service: Cardiovascular;  Laterality: N/A;  . Cardiac catheterization N/A 10/02/2014    Procedure: Left Heart Cath and Coronary Angiography;  Surgeon: Belva Crome, MD;  Location: [place] CV LAB;  Service: Cardiovascular;  Laterality: N/A;  . Colonoscopy    . Coronary artery bypass graft N/A 11/03/2014    Procedure: CORONARY ARTERY BYPASS GRAFTING  times three using left internal mammary and right greater saphenous vein;  Surgeon: Gaye Pollack, MD;  Location: [place] OR;  Service: Open Heart Surgery;  Laterality:  N/A;  [place] Dates without cardioversion N/A 11/03/2014    Procedure: TRANSESOPHAGEAL ECHOCARDIOGRAM (TEE);  Surgeon: Gaye Pollack, MD;  Location: [place];  Service: Open Heart Surgery;  Laterality: N/A;    Family History  Problem Relation Age of Onset  . Colon cancer Father   . Heart disease Mother     Social History:  reports that he quit smoking about 6 months ago. His smoking use included Cigarettes. He has a 25 pack-year smoking history. He has never used smokeless tobacco. He reports that he does not drink alcohol or use illicit drugs.  Review of Systems:   Hypertension:  this is relatively mild, currently taking lisinopril 5 mg and carvedilol low-dose and blood pressure is well-controlled  LIVER action abnormality: Not clear if this has been evaluated by PCP  Lab Results  Component Value Date   ALT 55* 11/26/2014    Lipids: Well controlled with atorvastatin.  HDL is  usually low, triglycerides  normal   Lab Results  Component Value Date   CHOL 116 02/26/2015   HDL 38.80* 02/26/2015   LDLCALC 58 02/26/2015   TRIG 96.0 02/26/2015   CHOLHDL 3 02/26/2015    Foot exam done in 7/16, has callus formation on the left and significant sensory loss as well as decreased pulses  History of BPH present, taking Flomax   Examination:   BP 130/62 mmHg  Pulse 76  Temp(Src) 97.5 F (36.4 C) (Oral)  Resp 14  Ht _0  (1.88 m)  Wt 169 lb 6.4 oz (76.839 kg)  BMI 21.74 kg/m2  SpO2 98%  Body mass index is 21.74 kg/(m^2).   No pedal edema  ASSESSMENT/ PLAN:   Diabetes type 2   Blood glucose control is improving with starting Victoza  However he has had high readings after meals possibly from not taking his Glyset recently Fructosamine indicates mildly increased readings overall  Although he has some higher readings in the mornings these may be partly related to higher readings the night before Tolerating Victoza Has gained weight from increasing caloric intake He has done some exercise and is planning to go to the gym 3 days a week now Also since he has normal renal function he can go back to 2000 mg a day of metformin Reminded him to check more readings after meals and less in the morning He should be able to go back on Glyset today and asked him to take a half tablet at his light lunch lunch also since he is eating carbohydrates mostly   Patient Instructions  Glyset 1 at Bfst 1/2 at lunch, 1 at dinner  Metformin 1 twice daily  Check blood sugars on waking up   times a week Also check blood sugars about 2 hours after a meal and do this after different meals by rotation  Recommended blood sugar levels on waking up is 90-130 and about 2 hours after meal is 130-160  Please bring your blood sugar monitor to each visit, thank you       [facility] 05/04/2015, 12:45 PM   7

## 2015-05-26 ENCOUNTER — Other Ambulatory Visit: Payer: Self-pay | Admitting: *Deleted

## 2015-05-26 ENCOUNTER — Telehealth: Payer: Self-pay | Admitting: Endocrinology

## 2015-05-26 MED ORDER — VICTOZA 18 MG/3ML ~~LOC~~ SOPN: 1.2000 mg | PEN_INJECTOR | Freq: Every day | SUBCUTANEOUS | Status: DC

## 2015-05-26 NOTE — Telephone Encounter (Signed)
rx sent

## 2015-05-26 NOTE — Telephone Encounter (Signed)
Patient came into the office and would need an Rx sent immediately   Rx: Victoza  Pharmacy: [facility])   Thank you

## 2015-05-27 ENCOUNTER — Telehealth: Payer: Self-pay | Admitting: Endocrinology

## 2015-05-27 NOTE — Telephone Encounter (Signed)
rx was sent to patients new mail order pharmacy yesterday.

## 2015-05-27 NOTE — Telephone Encounter (Signed)
Pt calling to let us know the new mail order pharm is [facility] # [id number] Pt needs victoza rx called in

## 2015-07-01 ENCOUNTER — Other Ambulatory Visit: Payer: Commercial Managed Care - HMO

## 2015-07-02 ENCOUNTER — Other Ambulatory Visit (INDEPENDENT_AMBULATORY_CARE_PROVIDER_SITE_OTHER): Payer: PPO

## 2015-07-02 DIAGNOSIS — IMO0002 Reserved for concepts with insufficient information to code with codable children: Secondary | ICD-10-CM

## 2015-07-02 DIAGNOSIS — E1165 Type 2 diabetes mellitus with hyperglycemia: Secondary | ICD-10-CM | POA: Diagnosis not present

## 2015-07-02 DIAGNOSIS — E118 Type 2 diabetes mellitus with unspecified complications: Secondary | ICD-10-CM | POA: Diagnosis not present

## 2015-07-02 LAB — COMPREHENSIVE METABOLIC PANEL
ALT: 25 U/L (ref 0–53)
AST: 20 U/L (ref 0–37)
Albumin: 3.9 g/dL (ref 3.5–5.2)
Alkaline Phosphatase: 66 U/L (ref 39–117)
BUN: 25 mg/dL — ABNORMAL HIGH (ref 6–23)
CO2: 24 mEq/L (ref 19–32)
Calcium: 9.2 mg/dL (ref 8.4–10.5)
Chloride: 108 mEq/L (ref 96–112)
Creatinine, Ser: 1.12 mg/dL (ref 0.40–1.50)
GFR: 68.08 mL/min (ref >60.00)
Glucose, Bld: 136 mg/dL — ABNORMAL HIGH (ref 70–99)
Potassium: 4.2 mEq/L (ref 3.5–5.1)
Sodium: 138 mEq/L (ref 135–145)
Total Bilirubin: 0.8 mg/dL (ref 0.2–1.2)
Total Protein: 6.4 g/dL (ref 6.0–8.3)

## 2015-07-02 LAB — HEMOGLOBIN A1C: Hgb A1c MFr Bld: 7.4 % — ABNORMAL HIGH (ref 4.6–6.5)

## 2015-07-06 ENCOUNTER — Ambulatory Visit (INDEPENDENT_AMBULATORY_CARE_PROVIDER_SITE_OTHER): Payer: PPO | Admitting: Endocrinology

## 2015-07-06 ENCOUNTER — Encounter: Payer: Self-pay | Admitting: Endocrinology

## 2015-07-06 VITALS — BP 138/68 | HR 75 | Temp 97.4°F | Resp 16 | Ht 74.0 in | Wt 165.4 lb

## 2015-07-06 DIAGNOSIS — E1165 Type 2 diabetes mellitus with hyperglycemia: Secondary | ICD-10-CM | POA: Diagnosis not present

## 2015-07-06 NOTE — Progress Notes (Signed)
Patient ID: Mario [name], male   DOB: January 29, 1941, 75 y.o.   MRN: [id number]   Reason for Appointment: Diabetes follow-up   History of Present Illness   Diagnosis: Type 2 DIABETES MELITUS, date of diagnosis:  1989     Previous history: He was initially diagnosed when hospitalized for pancreatitis and his previous endocrinologist had treated him with multiple medications because of progression of his diabetes. He was also put on Cycloset  which he has tolerated maximum dose He has had adjustments of his medication dosages the last couple of years and Januvia was restarted in 5/14 when blood sugars were higher. Dosages have been adjusted based on renal function Previously an A1c levels have been ranging from 6.6-7.5 His metformin was increased  when renal function was better and glipizide was changed to glipizide ER 10 mg. Did not appear to have better blood sugar control with Invokana  Recent history:  His A1c was higher than usual and nearly 8% in 10/16 despite his multiple drug regimen He was started on Victoza for improved control and  has been able to tolerate 1.2 mg without side effects On his visit in 12/16 he was not taking his Glyset as it was not filled by his pharmacy and blood sugars were generally higher especially after meals  Blood sugar patterns and problems:  FASTING blood sugars are much more consistent now and averaging 126, he is checking these about everyday  He again forgets to check his sugars after meals especially after supper  Has a few readings after lunch which are looking excellent most of the time  Overall he has been very consistent with exercise on his own and is generally active  Weight is slightly lower since his last visit  He thinks he is generally watching his diet   A1c has started to improve, now 7.4, his blood sugars may have been a little higher in January also  Occasionally may forget to take Glyset at the start of the meal and  may take it right after  Oral hypoglycemic drugs:  Glyset, glipizide ER 10 mg , Metformin 1 g twice a day      Side effects from medications: None. Proper timing of medications in relation to meals: usually  Monitors blood glucose: Once a day.    Glucometer: Contour    Blood Glucose readings from   Mean values apply above for all meters except median for One Touch  PRE-MEAL Fasting Lunch Dinner Bedtime Overall  Glucose range:  100-155   109, 142   124-176     Mean/median:  126    144    128    Hypoglycemia: None recently      Meals: 2-3 meals per day, no lunch often (yogurt); breakfast is cereal, [place] bacon, sometimes bread and juice at breakfast, sweets     Dietician 3/16       Physical activity: exercise:   going to the gym 3/7     Wt Readings from Last 3 Encounters:  07/06/15 165 lb 6.4 oz (75.025 kg)  05/04/15 169 lb 6.4 oz (76.839 kg)  03/24/15 161 lb 4 oz (73.143 kg)    Lab Results  Component Value Date   HGBA1C 7.4* 07/02/2015   HGBA1C 7.8* 02/26/2015   HGBA1C 7.3* 11/26/2014   Lab Results  Component Value Date   MICROALBUR 14.7* 02/26/2015   LDLCALC 58 02/26/2015   CREATININE 1.12 07/02/2015     LABS:  Lab on 07/02/2015  Component  Date Value Ref Range Status  . Hgb A1c MFr Bld 07/02/2015 7.4* 4.6 - 6.5 % Final   Glycemic Control Guidelines for People with Diabetes:Non Diabetic:  <6%Goal of Therapy: <7%Additional Action Suggested:  >8%   . Sodium 07/02/2015 138  135 - 145 mEq/L Final  . Potassium 07/02/2015 4.2  3.5 - 5.1 mEq/L Final  . Chloride 07/02/2015 108  96 - 112 mEq/L Final  . CO2 07/02/2015 24  19 - 32 mEq/L Final  . Glucose, Bld 07/02/2015 136* 70 - 99 mg/dL Final  . BUN 07/02/2015 25* 6 - 23 mg/dL Final  . Creatinine, Ser 07/02/2015 1.12  0.40 - 1.50 mg/dL Final  . Total Bilirubin 07/02/2015 0.8  0.2 - 1.2 mg/dL Final  . Alkaline Phosphatase 07/02/2015 66  39 - 117 U/L Final  . AST 07/02/2015 20  0 - 37 U/L Final  . ALT 07/02/2015 25  0 -  53 U/L Final  . Total Protein 07/02/2015 6.4  6.0 - 8.3 g/dL Final  . Albumin 07/02/2015 3.9  3.5 - 5.2 g/dL Final  . Calcium 07/02/2015 9.2  8.4 - 10.5 mg/dL Final  . GFR 07/02/2015 68.08  >60.00 mL/min Final      Medication List       This list is accurate as of: 07/06/15  3:50 PM.  Always use your most recent med list.               ACCU-CHEK AVIVA PLUS w/Device Kit  USE TO CHECK BLOOD SUGAR 2 TIMES PER DAY     ACCU-CHEK FASTCLIX LANCETS Misc  Use to check blood sugar 2 times per day dx code E11.65     ACCU-CHEK SMARTVIEW CONTROL Liqd  Use as directed     acetaminophen 500 MG tablet  Commonly known as:  TYLENOL  Take 500 mg by mouth daily as needed.     allopurinol 100 MG tablet  Commonly known as:  ZYLOPRIM  TAKE 1 TABLET EVERY DAY     aspirin 325 MG EC tablet  Take 1 tablet (325 mg total) by mouth daily.     atorvastatin 40 MG tablet  Commonly known as:  LIPITOR  TAKE 1 TABLET EVERY DAY AT  6 PM     Bromocriptine Mesylate 0.8 MG Tabs  Take 4 mg by mouth daily.     carvedilol 3.125 MG tablet  Commonly known as:  COREG  Take 1 tablet (3.125 mg total) by mouth 2 (two) times daily.     cholecalciferol 1000 units tablet  Commonly known as:  VITAMIN D  Take 2,000 Units by mouth daily.     Co-Enzyme Q-10 100 MG Caps  Take 100 mg by mouth daily.     ferrous sulfate 325 (65 FE) MG tablet  Take 325 mg by mouth daily.     fluorouracil 5 % cream  Commonly known as:  EFUDEX  Apply 1 application topically daily as needed (skin irritations).     folic acid 962 MCG tablet  Commonly known as:  FOLVITE  Take 800 mcg by mouth daily.     glipiZIDE 10 MG 24 hr tablet  Commonly known as:  GLUCOTROL XL  TAKE 1 TABLET AT BEDTIME     glucose blood test strip  Commonly known as:  BAYER CONTOUR NEXT TEST  Use as instructed to check blood sugar 2 times per day dx code E11.65     Insulin Pen Needle 31G X 8 MM Misc  Use daily as  instructed Dx E11.65      ipratropium-albuterol 0.5-2.5 (3) MG/3ML Soln  Commonly known as:  DUONEB  Take 3 mLs by nebulization 2 (two) times daily.     lisinopril 5 MG tablet  Commonly known as:  PRINIVIL,ZESTRIL  Take 1 tablet (5 mg total) by mouth daily.     metFORMIN 1000 MG tablet  Commonly known as:  GLUCOPHAGE  Take 1 tablet (1,000 mg total) by mouth 2 (two) times daily with a meal.     miglitol 50 MG tablet  Commonly known as:  GLYSET  Take 1 tablet by mouth two times daily dx code E11.65     NITROSTAT 0.4 MG SL tablet  Generic drug:  nitroGLYCERIN  PLACE 1 TABLET (0.4 MG TOTAL) UNDER THE TONGUE EVERY 5 (FIVE) MINUTES AS NEEDED.     omega-3 acid ethyl esters 1 g capsule  Commonly known as:  LOVAZA  Take 1 g by mouth daily.     PRESERVISION AREDS 2 Caps  Take 2 capsules by mouth daily.     tamsulosin 0.4 MG Caps capsule  Commonly known as:  FLOMAX  Take 1 capsule (0.4 mg total) by mouth daily.     VICTOZA 18 MG/3ML Sopn  Generic drug:  Liraglutide  Inject 0.2 mLs (1.2 mg total) into the skin daily. Inject once daily at the same time     vitamin B-12 500 MCG tablet  Commonly known as:  CYANOCOBALAMIN  Take 1,000 mcg by mouth daily.     vitamin E 100 UNIT capsule  Take 100 Units by mouth daily.        Allergies:  Allergies  Allergen Reactions  . Penicillins Anaphylaxis  . Plavix [Clopidogrel Bisulfate] Hives  . Pletal [Cilostazol] Itching    Past Medical History  Diagnosis Date  . Essential hypertension, benign   . HLD (hyperlipidemia)   . PAD (peripheral artery disease) (HCC)     Dr. Fletcher Anon  . Orthostasis   . CAD (coronary artery disease)     a.  s/p IMI and BMS 1997;  b. Myoview 4/16:  inf scar with peri-infarct ischemia, EF 34%; high risk;  c. LHC 5/16:  3 v CAD >> CABG (free L-LAD, S-OM, S-dRCA)  . Gout   . Pneumonia     hx of  . Chronic kidney disease, stage II (mild)   . Arthritis   . Type II or unspecified type diabetes mellitus without mention of complication,  uncontrolled   . Ischemic cardiomyopathy     a. EF by myoview 4/16 34%; b. LHC 5/16: EF 40-45%;  c. intraop TEE 6/16: EF 45-50%  . Chronic systolic CHF (congestive heart failure) ([place])   . Carotid stenosis     a. carotid US 6/16:  bilat ICA 1-39%    Past Surgical History  Procedure Laterality Date  . Hernia repair    . Cholecystectomy    . Cataract extraction    . Melanoma excision    . Skin lesion excision      multiple  . Abdominal aortagram N/A 10/09/2013    Procedure: ABDOMINAL Maxcine Ham;  Surgeon: Wellington Hampshire, MD;  Location: [facility] CATH LAB;  Service: Cardiovascular;  Laterality: N/A;  . Cardiac catheterization N/A 10/02/2014    Procedure: Left Heart Cath and Coronary Angiography;  Surgeon: Belva Crome, MD;  Location: [place] CV LAB;  Service: Cardiovascular;  Laterality: N/A;  . Colonoscopy    . Coronary artery bypass graft N/A 11/03/2014    Procedure:  CORONARY ARTERY BYPASS GRAFTING  times three using left internal mammary and right greater saphenous vein;  Surgeon: Gaye Pollack, MD;  Location: [place] OR;  Service: Open Heart Surgery;  Laterality: N/A;  . Tee without cardioversion N/A 11/03/2014    Procedure: TRANSESOPHAGEAL ECHOCARDIOGRAM (TEE);  Surgeon: Gaye Pollack, MD;  Location: [place];  Service: Open Heart Surgery;  Laterality: N/A;    Family History  Problem Relation Age of Onset  . Colon cancer Father   . Heart disease Mother     Social History:  reports that he quit smoking about 8 months ago. His smoking use included Cigarettes. He has a 25 pack-year smoking history. He has never used smokeless tobacco. He reports that he does not drink alcohol or use illicit drugs.  Review of Systems:   Hypertension:  currently taking lisinopril 5 mg and carvedilol low-dose and blood pressure is well-controlled, managed by cardiologist  LIVER action abnormality: Improved   Lab Results  Component Value Date   ALT 25 07/02/2015    Lipids: Well controlled with  atorvastatin 40 mg now.  HDL is  usually low, triglycerides normal   Lab Results  Component Value Date   CHOL 116 02/26/2015   HDL 38.80* 02/26/2015   LDLCALC 58 02/26/2015   TRIG 96.0 02/26/2015   CHOLHDL 3 02/26/2015    Foot exam done in 7/16, has callus formation on the left and significant sensory loss as well as decreased pulses  History of BPH present, taking Flomax   Examination:   BP 138/68 mmHg  Pulse 75  Temp(Src) 97.4 F (36.3 C)  Resp 16  Ht 6' 2"  (1.88 m)  Wt 165 lb 6.4 oz (75.025 kg)  BMI 21.23 kg/m2  SpO2 95%  Body mass index is 21.23 kg/(m^2).    ASSESSMENT/ PLAN:   Diabetes type 2  See history of present illness for detailed discussion of his current management, blood sugar patterns and problems identified  Blood glucose control is improving in the last couple of weeks at least with continuing Victoza  Also most likely blood sugars are generally better with adding Glyset back which was helping postprandial readings He does tolerate this well More recently also has been very good with his exercise regimen Activity level has increased with some weight loss  However he does not do blood sugars after supper as directed, these had been higher in the last visit  He will continue the same regimen and follow-up in 3 months, discussed possibility of reducing glipizide if he has low normal readings during the day   Patient Instructions  More sugars after supper  Check blood sugars on waking up 2-3 times a week Also check blood sugars about 2 hours after a meal and do this after different meals by rotation  Recommended blood sugar levels on waking up is 90-130 and about 2 hours after meal is 130-160  Please bring your blood sugar monitor to each visit, thank you      [facility] 07/06/2015, 3:50 PM   7

## 2015-07-06 NOTE — Patient Instructions (Signed)
More sugars after supper  Check blood sugars on waking up 2-3 times a week Also check blood sugars about 2 hours after a meal and do this after different meals by rotation  Recommended blood sugar levels on waking up is 90-130 and about 2 hours after meal is 130-160  Please bring your blood sugar monitor to each visit, thank you

## 2015-07-20 DIAGNOSIS — D0462 Carcinoma in situ of skin of left upper limb, including shoulder: Secondary | ICD-10-CM | POA: Diagnosis not present

## 2015-07-20 DIAGNOSIS — C44519 Basal cell carcinoma of skin of other part of trunk: Secondary | ICD-10-CM | POA: Diagnosis not present

## 2015-07-20 DIAGNOSIS — C44619 Basal cell carcinoma of skin of left upper limb, including shoulder: Secondary | ICD-10-CM | POA: Diagnosis not present

## 2015-08-03 ENCOUNTER — Other Ambulatory Visit: Payer: Self-pay | Admitting: *Deleted

## 2015-08-03 ENCOUNTER — Telehealth: Payer: Self-pay | Admitting: Endocrinology

## 2015-08-03 MED ORDER — MIGLITOL 50 MG PO TABS: ORAL_TABLET | ORAL | Status: DC

## 2015-08-03 MED ORDER — ATORVASTATIN CALCIUM 40 MG PO TABS: ORAL_TABLET | ORAL | Status: DC

## 2015-08-03 MED ORDER — ALLOPURINOL 100 MG PO TABS
100.0000 mg | ORAL_TABLET | Freq: Every day | ORAL | Status: DC
Start: 2015-08-03 — End: 2016-02-26

## 2015-08-03 MED ORDER — GLIPIZIDE ER 10 MG PO TB24: 10.0000 mg | ORAL_TABLET | Freq: Every day | ORAL | Status: DC

## 2015-08-03 NOTE — Telephone Encounter (Signed)
Patient called stating that he would like refills on medications  Rx:  Glyset Atorvastatin  allopurinolol Glipizide   Pharmacy: [facility]    Thank you

## 2015-08-03 NOTE — Telephone Encounter (Signed)
rxs sent

## 2015-08-07 DIAGNOSIS — Z7984 Long term (current) use of oral hypoglycemic drugs: Secondary | ICD-10-CM | POA: Diagnosis not present

## 2015-08-07 DIAGNOSIS — I739 Peripheral vascular disease, unspecified: Secondary | ICD-10-CM | POA: Diagnosis not present

## 2015-08-07 DIAGNOSIS — E1122 Type 2 diabetes mellitus with diabetic chronic kidney disease: Secondary | ICD-10-CM | POA: Diagnosis not present

## 2015-08-07 DIAGNOSIS — N183 Chronic kidney disease, stage 3 (moderate): Secondary | ICD-10-CM | POA: Diagnosis not present

## 2015-08-07 DIAGNOSIS — Z0000 Encounter for general adult medical examination without abnormal findings: Secondary | ICD-10-CM | POA: Diagnosis not present

## 2015-08-07 DIAGNOSIS — E11319 Type 2 diabetes mellitus with unspecified diabetic retinopathy without macular edema: Secondary | ICD-10-CM | POA: Diagnosis not present

## 2015-08-07 DIAGNOSIS — I2510 Atherosclerotic heart disease of native coronary artery without angina pectoris: Secondary | ICD-10-CM | POA: Diagnosis not present

## 2015-08-07 DIAGNOSIS — I10 Essential (primary) hypertension: Secondary | ICD-10-CM | POA: Diagnosis not present

## 2015-08-07 DIAGNOSIS — N401 Enlarged prostate with lower urinary tract symptoms: Secondary | ICD-10-CM | POA: Diagnosis not present

## 2015-08-07 DIAGNOSIS — I779 Disorder of arteries and arterioles, unspecified: Secondary | ICD-10-CM | POA: Diagnosis not present

## 2015-08-07 DIAGNOSIS — E7800 Pure hypercholesterolemia, unspecified: Secondary | ICD-10-CM | POA: Diagnosis not present

## 2015-08-07 DIAGNOSIS — Z1389 Encounter for screening for other disorder: Secondary | ICD-10-CM | POA: Diagnosis not present

## 2015-09-14 ENCOUNTER — Telehealth: Payer: Self-pay | Admitting: Endocrinology

## 2015-09-14 ENCOUNTER — Other Ambulatory Visit: Payer: Self-pay | Admitting: *Deleted

## 2015-09-14 MED ORDER — GLUCOSE BLOOD VI STRP: ORAL_STRIP | Status: DC

## 2015-09-14 NOTE — Telephone Encounter (Signed)
rx faxed

## 2015-09-14 NOTE — Telephone Encounter (Signed)
Contour next test strips need to be called into [facility]

## 2015-09-15 ENCOUNTER — Other Ambulatory Visit: Payer: Self-pay | Admitting: *Deleted

## 2015-09-15 MED ORDER — GLUCOSE BLOOD VI STRP: ORAL_STRIP | Status: DC

## 2015-09-15 MED ORDER — FREESTYLE FREEDOM LITE W/DEVICE KIT: PACK | Status: DC

## 2015-09-15 MED ORDER — FREESTYLE LANCETS MISC: Status: AC

## 2015-09-18 ENCOUNTER — Encounter: Payer: Self-pay | Admitting: Interventional Cardiology

## 2015-09-18 ENCOUNTER — Ambulatory Visit (INDEPENDENT_AMBULATORY_CARE_PROVIDER_SITE_OTHER): Payer: PPO | Admitting: Interventional Cardiology

## 2015-09-18 VITALS — BP 98/50 | HR 64 | Ht 74.0 in | Wt 163.0 lb

## 2015-09-18 DIAGNOSIS — E785 Hyperlipidemia, unspecified: Secondary | ICD-10-CM

## 2015-09-18 DIAGNOSIS — I5022 Chronic systolic (congestive) heart failure: Secondary | ICD-10-CM

## 2015-09-18 DIAGNOSIS — I25810 Atherosclerosis of coronary artery bypass graft(s) without angina pectoris: Secondary | ICD-10-CM | POA: Diagnosis not present

## 2015-09-18 DIAGNOSIS — I10 Essential (primary) hypertension: Secondary | ICD-10-CM

## 2015-09-18 DIAGNOSIS — I255 Ischemic cardiomyopathy: Secondary | ICD-10-CM

## 2015-09-18 NOTE — Patient Instructions (Addendum)
Medication Instructions:  Your physician recommends that you continue on your current medications as directed. Please refer to the Current Medication list given to you today.  Labwork: None ordered  Testing/Procedures: None ordered  Follow-Up: Your physician wants you to follow-up in: 1 year with Dr. Tamala Julian. You will receive a reminder letter in the mail two months in advance. If you don't receive a letter, please call our office to schedule the follow-up appointment.  If you need a refill on your cardiac medications before your next appointment, please call your pharmacy.  Thank you for choosing [facility]!!

## 2015-09-18 NOTE — Progress Notes (Signed)
Cardiology Office Note   Date:  09/18/2015   ID:  Mario [name], DOB 1940-10-06, MRN [id number]  PCP:  Kandice Hams, MD  Cardiologist:  Sinclair Grooms, MD   Chief Complaint  Patient presents with  . Coronary Artery Disease      History of Present Illness: Mario [name] is a 75 y.o. male who presents for follow-up coronary artery disease, recent coronary bypass grafting, peripheral arterial disease, hyperlipidemia, diabetes mellitus, essential hypertension, and combined systolic and diastolic heart failure.  No CV complaints. He stopped smoking. Appetite is good. He is having difficulty with urination.    Past Medical History  Diagnosis Date  . Essential hypertension, benign   . HLD (hyperlipidemia)   . PAD (peripheral artery disease) (HCC)     Dr. Fletcher Anon  . Orthostasis   . CAD (coronary artery disease)     a.  s/p IMI and BMS 1997;  b. Myoview 4/16:  inf scar with peri-infarct ischemia, EF 34%; high risk;  c. LHC 5/16:  3 v CAD >> CABG (free L-LAD, S-OM, S-dRCA)  . Gout   . Pneumonia     hx of  . Chronic kidney disease, stage II (mild)   . Arthritis   . Type II or unspecified type diabetes mellitus without mention of complication, uncontrolled   . Ischemic cardiomyopathy     a. EF by myoview 4/16 34%; b. LHC 5/16: EF 40-45%;  c. intraop TEE 6/16: EF 45-50%  . Chronic systolic CHF (congestive heart failure) ([place])   . Carotid stenosis     a. carotid US 6/16:  bilat ICA 1-39%    Past Surgical History  Procedure Laterality Date  . Hernia repair    . Cholecystectomy    . Cataract extraction    . Melanoma excision    . Skin lesion excision      multiple  . Abdominal aortagram N/A 10/09/2013    Procedure: ABDOMINAL Maxcine Ham;  Surgeon: Wellington Hampshire, MD;  Location: [facility] CATH LAB;  Service: Cardiovascular;  Laterality: N/A;  . Cardiac catheterization N/A 10/02/2014    Procedure: Left Heart Cath and Coronary Angiography;  Surgeon: Belva Crome, MD;  Location:  [place] CV LAB;  Service: Cardiovascular;  Laterality: N/A;  . Colonoscopy    . Coronary artery bypass graft N/A 11/03/2014    Procedure: CORONARY ARTERY BYPASS GRAFTING  times three using left internal mammary and right greater saphenous vein;  Surgeon: Gaye Pollack, MD;  Location: [place] OR;  Service: Open Heart Surgery;  Laterality: N/A;  . Tee without cardioversion N/A 11/03/2014    Procedure: TRANSESOPHAGEAL ECHOCARDIOGRAM (TEE);  Surgeon: Gaye Pollack, MD;  Location: [place];  Service: Open Heart Surgery;  Laterality: N/A;     Current Outpatient Prescriptions  Medication Sig Dispense Refill  . acetaminophen (TYLENOL) 500 MG tablet Take 500 mg by mouth daily as needed.     Marland Kitchen allopurinol (ZYLOPRIM) 100 MG tablet Take 1 tablet (100 mg total) by mouth daily. 90 tablet 1  . aspirin EC 325 MG EC tablet Take 1 tablet (325 mg total) by mouth daily. 30 tablet 0  . atorvastatin (LIPITOR) 40 MG tablet TAKE 1 TABLET EVERY DAY AT  6 PM 90 tablet 1  . Blood Glucose Calibration (ACCU-CHEK SMARTVIEW CONTROL) LIQD Use as directed 1 each 1  . Blood Glucose Monitoring Suppl (FREESTYLE FREEDOM LITE) w/Device KIT Use as directed to check blood sugar 2 times per day dx code E11.65 1 each  0  . Bromocriptine Mesylate 0.8 MG TABS Take 4 mg by mouth daily.     . carvedilol (COREG) 3.125 MG tablet Take 1 tablet (3.125 mg total) by mouth 2 (two) times daily. 180 tablet 3  . cholecalciferol (VITAMIN D) 1000 UNITS tablet Take 2,000 Units by mouth daily.    Marland Kitchen Co-Enzyme Q-10 100 MG CAPS Take 100 mg by mouth daily.    . ferrous sulfate 325 (65 FE) MG tablet Take 325 mg by mouth daily.     . folic acid (FOLVITE) 048 MCG tablet Take 800 mcg by mouth daily.    Marland Kitchen glipiZIDE (GLUCOTROL XL) 10 MG 24 hr tablet Take 1 tablet (10 mg total) by mouth at bedtime. 90 tablet 1  . glucose blood (FREESTYLE LITE) test strip Use as instructed to check blood sugar 2 times per day dx code E11.65 100 each 3  . Insulin Pen Needle 31G X 8 MM  MISC Use daily as instructed Dx E11.65 100 each 1  . ipratropium-albuterol (DUONEB) 0.5-2.5 (3) MG/3ML SOLN Take 3 mLs by nebulization 2 (two) times daily. 360 mL 0  . Lancets (FREESTYLE) lancets Use as instructed to check blood sugar 2 times per day dx code E11.65 100 each 3  . lisinopril (PRINIVIL,ZESTRIL) 5 MG tablet Take 1 tablet (5 mg total) by mouth daily. 30 tablet 11  . metFORMIN (GLUCOPHAGE) 1000 MG tablet Take 1 tablet (1,000 mg total) by mouth 2 (two) times daily with a meal. (Patient taking differently: Take 500 mg by mouth 2 (two) times daily with a meal. ) 180 tablet 3  . miglitol (GLYSET) 50 MG tablet Take 1 tablet by mouth two times daily dx code E11.65 180 tablet 1  . NITROSTAT 0.4 MG SL tablet PLACE 1 TABLET (0.4 MG TOTAL) UNDER THE TONGUE EVERY 5 (FIVE) MINUTES AS NEEDED.  3  . omega-3 acid ethyl esters (LOVAZA) 1 G capsule Take 1 g by mouth daily.    . tamsulosin (FLOMAX) 0.4 MG CAPS capsule Take 1 capsule (0.4 mg total) by mouth daily. 30 capsule 0  . VICTOZA 18 MG/3ML SOPN Inject 0.2 mLs (1.2 mg total) into the skin daily. Inject once daily at the same time 18 mL 1  . vitamin B-12 (CYANOCOBALAMIN) 500 MCG tablet Take 1,000 mcg by mouth daily.    . vitamin E 100 UNIT capsule Take 100 Units by mouth daily.    . fluorouracil (EFUDEX) 5 % cream Apply 1 application topically daily as needed (skin irritations). Reported on 09/18/2015    . Multiple Vitamins-Minerals (PRESERVISION AREDS 2) CAPS Take 2 capsules by mouth daily. Reported on 09/18/2015     No current facility-administered medications for this visit.    Allergies:   Penicillins; Plavix; and Pletal    Social History:  The patient  reports that he quit smoking about 10 months ago. His smoking use included Cigarettes. He has a 25 pack-year smoking history. He has never used smokeless tobacco. He reports that he does not drink alcohol or use illicit drugs.   Family History:  The patient's family history includes Colon  cancer in his father; Heart disease in his mother.    ROS:  Please see the history of present illness.   Otherwise, review of systems are positive for difficulty urinating.   All other systems are reviewed and negative.    PHYSICAL EXAM: VS:  BP 98/50 mmHg  Pulse 64  Ht _0  (1.88 m)  Wt 163 lb (73.936 kg)  BMI 20.92 kg/m2 , BMI Body mass index is 20.92 kg/(m^2). GEN: Well nourished, well developed, in no acute distress HEENT: normal Neck: no JVD, carotid bruits, or masses Cardiac: RRR.  There is  no murmur, rub, or gallop. There is no edema. Respiratory:  clear to auscultation bilaterally, normal work of breathing. GI: soft, nontender, nondistended, + BS MS: no deformity or atrophy Skin: warm and dry, no rash Neuro:  Strength and sensation are intact Psych: euthymic mood, full affect   EKG:  EKG is not ordered today.    Recent Labs: 11/04/2014: Magnesium 2.2 11/05/2014: Hemoglobin 10.1*; Platelets 112* 07/02/2015: ALT 25; BUN 25*; Creatinine, Ser 1.12; Potassium 4.2; Sodium 138    Lipid Panel    Component Value Date/Time   CHOL 116 02/26/2015 0957   TRIG 96.0 02/26/2015 0957   HDL 38.80* 02/26/2015 0957   CHOLHDL 3 02/26/2015 0957   VLDL 19.2 02/26/2015 0957   LDLCALC 58 02/26/2015 0957      Wt Readings from Last 3 Encounters:  09/18/15 163 lb (73.936 kg)  07/06/15 165 lb 6.4 oz (75.025 kg)  05/04/15 169 lb 6.4 oz (76.839 kg)      Other studies Reviewed: Additional studies/ records that were reviewed today include: Lab review. The findings include normal kidney function.    ASSESSMENT AND PLAN:  1. Coronary artery disease involving coronary bypass graft of native heart without angina pectoris Stable without angina post bypass surgery  2. Hyperlipidemia Followed by primary care/endocrinology  3. Chronic systolic CHF (congestive heart failure) (HCC) No evidence of volume overload and significant improvement in LV function to a point where years now  classified as chronic combined systolic and diastolic heart failure.  4. Essential hypertension, benign Blood pressure on my evaluation is 130/75 mmHg  5. Ischemic cardiomyopathy Improved    Current medicines are reviewed at length with the patient today.  The patient has the following concerns regarding medicines: None.  The following changes/actions have been instituted:    Continue the current medication  Clinical follow-up in one year  Aerobic activity  Labs/ tests ordered today include:  No orders of the defined types were placed in this encounter.     Disposition:   FU with HS in 1 year  Signed, Sinclair Grooms, MD  09/18/2015 12:45 PM    [address], [place], [place]  [address] Phone: [phone]; Fax: [phone]

## 2015-09-24 DIAGNOSIS — Z0000 Encounter for general adult medical examination without abnormal findings: Secondary | ICD-10-CM | POA: Diagnosis not present

## 2015-09-24 DIAGNOSIS — R351 Nocturia: Secondary | ICD-10-CM | POA: Diagnosis not present

## 2015-09-24 DIAGNOSIS — R3912 Poor urinary stream: Secondary | ICD-10-CM | POA: Diagnosis not present

## 2015-09-24 DIAGNOSIS — N401 Enlarged prostate with lower urinary tract symptoms: Secondary | ICD-10-CM | POA: Diagnosis not present

## 2015-09-29 ENCOUNTER — Other Ambulatory Visit: Payer: PPO

## 2015-09-30 ENCOUNTER — Other Ambulatory Visit (INDEPENDENT_AMBULATORY_CARE_PROVIDER_SITE_OTHER): Payer: PPO

## 2015-09-30 DIAGNOSIS — E1165 Type 2 diabetes mellitus with hyperglycemia: Secondary | ICD-10-CM

## 2015-09-30 LAB — COMPREHENSIVE METABOLIC PANEL
ALT: 40 U/L (ref 0–53)
AST: 26 U/L (ref 0–37)
Albumin: 3.9 g/dL (ref 3.5–5.2)
Alkaline Phosphatase: 56 U/L (ref 39–117)
BUN: 31 mg/dL — ABNORMAL HIGH (ref 6–23)
CO2: 19 mEq/L (ref 19–32)
Calcium: 8.9 mg/dL (ref 8.4–10.5)
Chloride: 110 mEq/L (ref 96–112)
Creatinine, Ser: 1.29 mg/dL (ref 0.40–1.50)
GFR: 57.79 mL/min — ABNORMAL LOW (ref >60.00)
Glucose, Bld: 239 mg/dL — ABNORMAL HIGH (ref 70–99)
Potassium: 4.7 mEq/L (ref 3.5–5.1)
Sodium: 138 mEq/L (ref 135–145)
Total Bilirubin: 0.9 mg/dL (ref 0.2–1.2)
Total Protein: 5.8 g/dL — ABNORMAL LOW (ref 6.0–8.3)

## 2015-09-30 LAB — HEMOGLOBIN A1C: Hgb A1c MFr Bld: 7.7 % — ABNORMAL HIGH (ref 4.6–6.5)

## 2015-10-02 ENCOUNTER — Ambulatory Visit (INDEPENDENT_AMBULATORY_CARE_PROVIDER_SITE_OTHER): Payer: Commercial Managed Care - HMO | Admitting: Ophthalmology

## 2015-10-02 ENCOUNTER — Encounter: Payer: Self-pay | Admitting: Endocrinology

## 2015-10-02 ENCOUNTER — Ambulatory Visit (INDEPENDENT_AMBULATORY_CARE_PROVIDER_SITE_OTHER): Payer: PPO | Admitting: Endocrinology

## 2015-10-02 VITALS — BP 118/66 | HR 72 | Temp 97.5°F | Resp 16 | Ht 74.0 in | Wt 167.4 lb

## 2015-10-02 DIAGNOSIS — E1165 Type 2 diabetes mellitus with hyperglycemia: Secondary | ICD-10-CM

## 2015-10-02 NOTE — Progress Notes (Signed)
Patient ID: Mario [name], male   DOB: Sep 22, 1940, 75 y.o.   MRN: [id number]   Reason for Appointment: Diabetes follow-up   History of Present Illness   Diagnosis: Type 2 DIABETES MELITUS, date of diagnosis:  1989     Previous history: He was initially diagnosed when hospitalized for pancreatitis and his previous endocrinologist had treated him with multiple medications because of progression of his diabetes. He was also put on Cycloset  which he has tolerated maximum dose He has had adjustments of his medication dosages the last couple of years and Januvia was restarted in 5/14 when blood sugars were higher. Dosages have been adjusted based on renal function Previously an A1c levels have been ranging from 6.6-7.5 His metformin was increased  when renal function was better and glipizide was changed to glipizide ER 10 mg. Did not appear to have better blood sugar control with Invokana  Recent history:  His A1c was higher than usual and nearly 8% in 10/16 despite his multiple drug regimen He was started on Victoza for improved control  However his A1c is now gradually increasing again and is 7.7 He did not bring his monitor for download today  Blood sugar patterns and problems:  FASTING blood sugars are reportedly fairly good with only occasional reading up to 130 but he also had a 55 reading without symptoms  He has not had any hypoglycemia otherwise  He is sometimes checking  sugars after evening meal and do this ranges from 140-150.  Previously he would normally not check readings after meals  He is taking his GLYSET with breakfast and supper but usually does not take it when he is eating during the day either at lunch or frozen yogurt.  His glucose in the lab after eating frozen yogurt was 259  Weight has increased slightly  He thinks he is generally watching his diet  He is still trying to be fairly active with various kinds of exercises and will also start doing some  swimming   Oral hypoglycemic drugs:  Glyset bid, glipizide ER 10 mg , Metformin 1 g twice a day      Side effects from medications: None. Proper timing of medications in relation to meals: usually  Monitors blood glucose: Once a day.    Glucometer: Contour    Blood Glucose readings from recall as above    Meals: 2-3 meals per day, no lunch often (yogurt); breakfast is cereal, [place] bacon, sometimes bread and juice at breakfast, sweets     Dietician 3/16       Physical activity: exercise:  going to the gym 3/7     Wt Readings from Last 3 Encounters:  10/02/15 167 lb 6.4 oz (75.932 kg)  09/18/15 163 lb (73.936 kg)  07/06/15 165 lb 6.4 oz (75.025 kg)    Lab Results  Component Value Date   HGBA1C 7.7* 09/30/2015   HGBA1C 7.4* 07/02/2015   HGBA1C 7.8* 02/26/2015   Lab Results  Component Value Date   MICROALBUR 14.7* 02/26/2015   LDLCALC 58 02/26/2015   CREATININE 1.29 09/30/2015     LABS:  Lab on 09/30/2015  Component Date Value Ref Range Status  . Hgb A1c MFr Bld 09/30/2015 7.7* 4.6 - 6.5 % Final   Glycemic Control Guidelines for People with Diabetes:Non Diabetic:  <6%Goal of Therapy: <7%Additional Action Suggested:  >8%   . Sodium 09/30/2015 138  135 - 145 mEq/L Final  . Potassium 09/30/2015 4.7  3.5 - 5.1 mEq/L  Final  . Chloride 09/30/2015 110  96 - 112 mEq/L Final  . CO2 09/30/2015 19  19 - 32 mEq/L Final  . Glucose, Bld 09/30/2015 239* 70 - 99 mg/dL Final  . BUN 09/30/2015 31* 6 - 23 mg/dL Final  . Creatinine, Ser 09/30/2015 1.29  0.40 - 1.50 mg/dL Final  . Total Bilirubin 09/30/2015 0.9  0.2 - 1.2 mg/dL Final  . Alkaline Phosphatase 09/30/2015 56  39 - 117 U/L Final  . AST 09/30/2015 26  0 - 37 U/L Final  . ALT 09/30/2015 40  0 - 53 U/L Final  . Total Protein 09/30/2015 5.8* 6.0 - 8.3 g/dL Final  . Albumin 09/30/2015 3.9  3.5 - 5.2 g/dL Final  . Calcium 09/30/2015 8.9  8.4 - 10.5 mg/dL Final  . GFR 09/30/2015 57.79* >60.00 mL/min Final      Medication List        This list is accurate as of: 10/02/15  5:06 PM.  Always use your most recent med list.               ACCU-CHEK SMARTVIEW CONTROL Liqd  Use as directed     acetaminophen 500 MG tablet  Commonly known as:  TYLENOL  Take 500 mg by mouth daily as needed.     allopurinol 100 MG tablet  Commonly known as:  ZYLOPRIM  Take 1 tablet (100 mg total) by mouth daily.     aspirin 325 MG EC tablet  Take 1 tablet (325 mg total) by mouth daily.     atorvastatin 40 MG tablet  Commonly known as:  LIPITOR  TAKE 1 TABLET EVERY DAY AT  6 PM     Bromocriptine Mesylate 0.8 MG Tabs  Take 4 mg by mouth daily.     carvedilol 3.125 MG tablet  Commonly known as:  COREG  Take 1 tablet (3.125 mg total) by mouth 2 (two) times daily.     cholecalciferol 1000 units tablet  Commonly known as:  VITAMIN D  Take 2,000 Units by mouth daily.     Co-Enzyme Q-10 100 MG Caps  Take 100 mg by mouth daily.     ferrous sulfate 325 (65 FE) MG tablet  Take 325 mg by mouth daily.     finasteride 5 MG tablet  Commonly known as:  PROSCAR  Take 5 mg by mouth daily.     fluorouracil 5 % cream  Commonly known as:  EFUDEX  Apply 1 application topically daily as needed (skin irritations). Reported on 1/94/1740     folic acid 814 MCG tablet  Commonly known as:  FOLVITE  Take 800 mcg by mouth daily.     FREESTYLE FREEDOM LITE w/Device Kit  Use as directed to check blood sugar 2 times per day dx code E11.65     freestyle lancets  Use as instructed to check blood sugar 2 times per day dx code E11.65     glipiZIDE 10 MG 24 hr tablet  Commonly known as:  GLUCOTROL XL  Take 1 tablet (10 mg total) by mouth at bedtime.     glucose blood test strip  Commonly known as:  FREESTYLE LITE  Use as instructed to check blood sugar 2 times per day dx code E11.65     Insulin Pen Needle 31G X 8 MM Misc  Use daily as instructed Dx E11.65     ipratropium-albuterol 0.5-2.5 (3) MG/3ML Soln  Commonly known as:  DUONEB    Take 3 mLs by  nebulization 2 (two) times daily.     lisinopril 5 MG tablet  Commonly known as:  PRINIVIL,ZESTRIL  Take 1 tablet (5 mg total) by mouth daily.     metFORMIN 1000 MG tablet  Commonly known as:  GLUCOPHAGE  Take 1 tablet (1,000 mg total) by mouth 2 (two) times daily with a meal.     miglitol 50 MG tablet  Commonly known as:  GLYSET  Take 1 tablet by mouth two times daily dx code E11.65     NITROSTAT 0.4 MG SL tablet  Generic drug:  nitroGLYCERIN  PLACE 1 TABLET (0.4 MG TOTAL) UNDER THE TONGUE EVERY 5 (FIVE) MINUTES AS NEEDED.     omega-3 acid ethyl esters 1 g capsule  Commonly known as:  LOVAZA  Take 1 g by mouth daily.     PRESERVISION AREDS 2 Caps  Take 2 capsules by mouth daily. Reported on 09/18/2015     tamsulosin 0.4 MG Caps capsule  Commonly known as:  FLOMAX  Take 1 capsule (0.4 mg total) by mouth daily.     VICTOZA 18 MG/3ML Sopn  Generic drug:  Liraglutide  Inject 0.2 mLs (1.2 mg total) into the skin daily. Inject once daily at the same time     vitamin B-12 500 MCG tablet  Commonly known as:  CYANOCOBALAMIN  Take 1,000 mcg by mouth daily.     vitamin E 100 UNIT capsule  Take 100 Units by mouth daily.        Allergies:  Allergies  Allergen Reactions  . Penicillins Anaphylaxis  . Plavix [Clopidogrel Bisulfate] Hives  . Pletal [Cilostazol] Itching    Past Medical History  Diagnosis Date  . Essential hypertension, benign   . HLD (hyperlipidemia)   . PAD (peripheral artery disease) (HCC)     Dr. Fletcher Anon  . Orthostasis   . CAD (coronary artery disease)     a.  s/p IMI and BMS 1997;  b. Myoview 4/16:  inf scar with peri-infarct ischemia, EF 34%; high risk;  c. LHC 5/16:  3 v CAD >> CABG (free L-LAD, S-OM, S-dRCA)  . Gout   . Pneumonia     hx of  . Chronic kidney disease, stage II (mild)   . Arthritis   . Type II or unspecified type diabetes mellitus without mention of complication, uncontrolled   . Ischemic cardiomyopathy     a. EF by  myoview 4/16 34%; b. LHC 5/16: EF 40-45%;  c. intraop TEE 6/16: EF 45-50%  . Chronic systolic CHF (congestive heart failure) ([place])   . Carotid stenosis     a. carotid US 6/16:  bilat ICA 1-39%    Past Surgical History  Procedure Laterality Date  . Hernia repair    . Cholecystectomy    . Cataract extraction    . Melanoma excision    . Skin lesion excision      multiple  . Abdominal aortagram N/A 10/09/2013    Procedure: ABDOMINAL Maxcine Ham;  Surgeon: Wellington Hampshire, MD;  Location: [facility] CATH LAB;  Service: Cardiovascular;  Laterality: N/A;  . Cardiac catheterization N/A 10/02/2014    Procedure: Left Heart Cath and Coronary Angiography;  Surgeon: Belva Crome, MD;  Location: [place] CV LAB;  Service: Cardiovascular;  Laterality: N/A;  . Colonoscopy    . Coronary artery bypass graft N/A 11/03/2014    Procedure: CORONARY ARTERY BYPASS GRAFTING  times three using left internal mammary and right greater saphenous vein;  Surgeon: Gaye Pollack,  MD;  Location: [facility] OR;  Service: Open Heart Surgery;  Laterality: N/A;  . Tee without cardioversion N/A 11/03/2014    Procedure: TRANSESOPHAGEAL ECHOCARDIOGRAM (TEE);  Surgeon: Gaye Pollack, MD;  Location: [place];  Service: Open Heart Surgery;  Laterality: N/A;    Family History  Problem Relation Age of Onset  . Colon cancer Father   . Heart disease Mother     Social History:  reports that he quit smoking about a year ago. His smoking use included Cigarettes. He has a 25 pack-year smoking history. He has never used smokeless tobacco. He reports that he does not drink alcohol or use illicit drugs.  Review of Systems:   Hypertension:  currently taking lisinopril 5 mg and carvedilol low-dose and blood pressure is well-controlled, managed by cardiologist  LIVER Function abnormality: Resolved   Lab Results  Component Value Date   ALT 40 09/30/2015    Lipids: Well controlled with atorvastatin 40 mg .  HDL is  usually low, triglycerides  normal   Lab Results  Component Value Date   CHOL 116 02/26/2015   HDL 38.80* 02/26/2015   LDLCALC 58 02/26/2015   TRIG 96.0 02/26/2015   CHOLHDL 3 02/26/2015    Foot exam done in 7/16, has callus formation on the left and significant sensory loss as well as decreased pulses    Examination:   BP 118/66 mmHg  Pulse 72  Temp(Src) 97.5 F (36.4 C)  Resp 16  Ht 6' 2" (1.88 m)  Wt 167 lb 6.4 oz (75.932 kg)  BMI 21.48 kg/m2  SpO2 98%  Body mass index is 21.48 kg/(m^2).    ASSESSMENT/ PLAN:   Diabetes type 2:  See history of present illness for detailed discussion of his current management, blood sugar patterns and problems identified  Blood glucose control is only fair and difficult to assess as he did not bring his monitor Also usually does not check readings after meals which are likely to be high Discussed that he needs to take Glyset at lunchtime or if he has a frozen yogurt snack since his blood sugar was well over 200 after doing this in the lab Otherwise generally is trying to be fairly good with his diet and exercising  We will see him back in 3 months with another A1c   Patient Instructions  Check blood sugars on waking up 3  times a week Also check blood sugars about 2 hours after a meal and do this after different meals by rotation  Recommended blood sugar levels on waking up is 90-130 and about 2 hours after meal is 130-160  Please bring your blood sugar monitor to each visit, thank you  Take Glyset at lunch        [facility] 10/02/2015, 5:06 PM

## 2015-10-02 NOTE — Patient Instructions (Signed)
Check blood sugars on waking up 3  times a week Also check blood sugars about 2 hours after a meal and do this after different meals by rotation  Recommended blood sugar levels on waking up is 90-130 and about 2 hours after meal is 130-160  Please bring your blood sugar monitor to each visit, thank you  Take Glyset at lunch

## 2015-10-05 ENCOUNTER — Ambulatory Visit (INDEPENDENT_AMBULATORY_CARE_PROVIDER_SITE_OTHER): Payer: PPO | Admitting: Ophthalmology

## 2015-10-05 DIAGNOSIS — H43813 Vitreous degeneration, bilateral: Secondary | ICD-10-CM

## 2015-10-05 DIAGNOSIS — I10 Essential (primary) hypertension: Secondary | ICD-10-CM | POA: Diagnosis not present

## 2015-10-05 DIAGNOSIS — H353122 Nonexudative age-related macular degeneration, left eye, intermediate dry stage: Secondary | ICD-10-CM

## 2015-10-05 DIAGNOSIS — H35033 Hypertensive retinopathy, bilateral: Secondary | ICD-10-CM | POA: Diagnosis not present

## 2015-10-12 ENCOUNTER — Telehealth: Payer: Self-pay | Admitting: Endocrinology

## 2015-10-12 NOTE — Telephone Encounter (Signed)
PT needs his Tamsulosin refilled at the [facility]

## 2015-10-12 NOTE — Telephone Encounter (Signed)
Disregard last message, PT called back and realized that was through his PCP

## 2015-10-19 IMAGING — DX DG CHEST 2V
2 series · 2 of 2 positions shown · non-contrast
Comparison: 11/04/2014

CLINICAL DATA: Post CABG, cough, pneumonia

EXAM:
CHEST  2 VIEW

[chest lat]
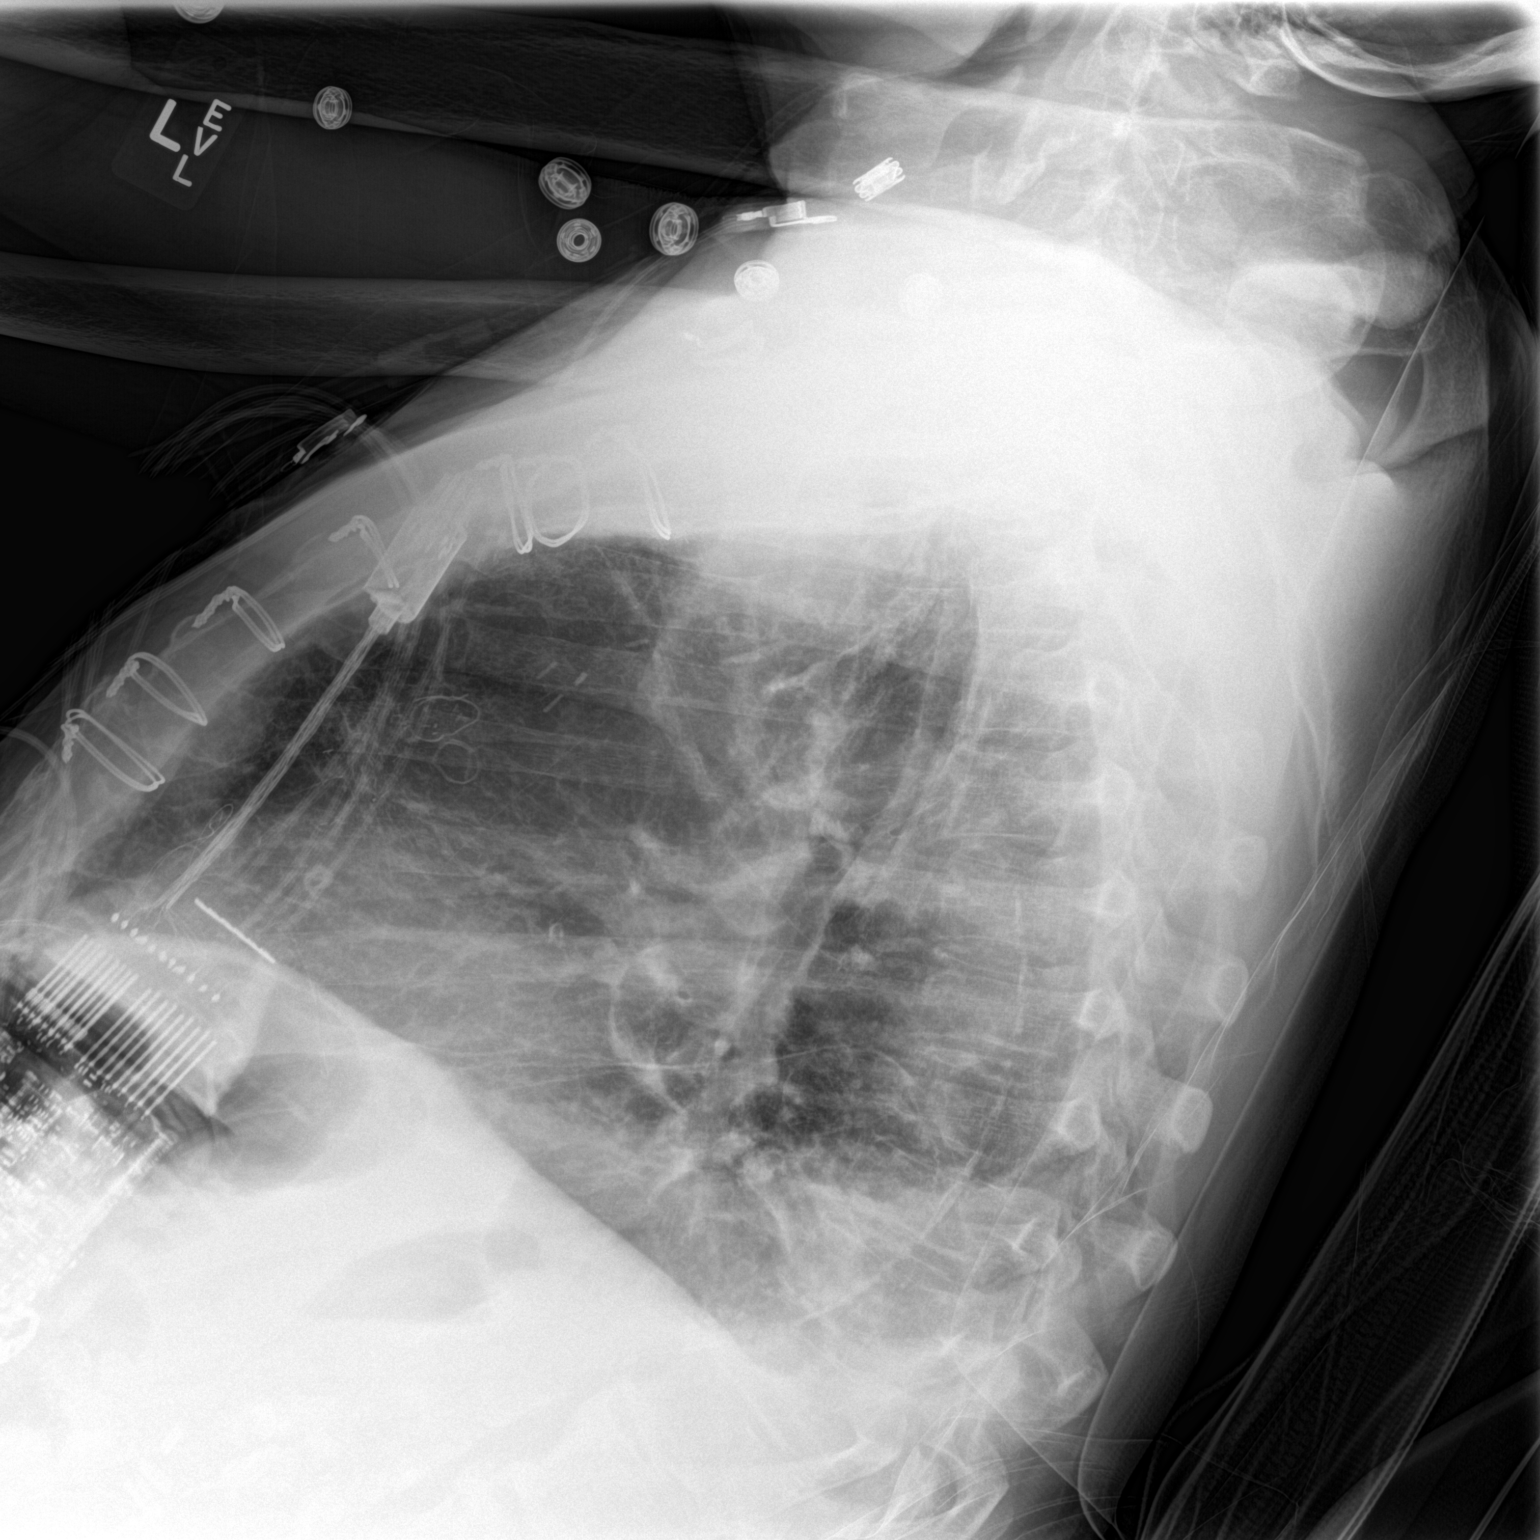

[chest ap]
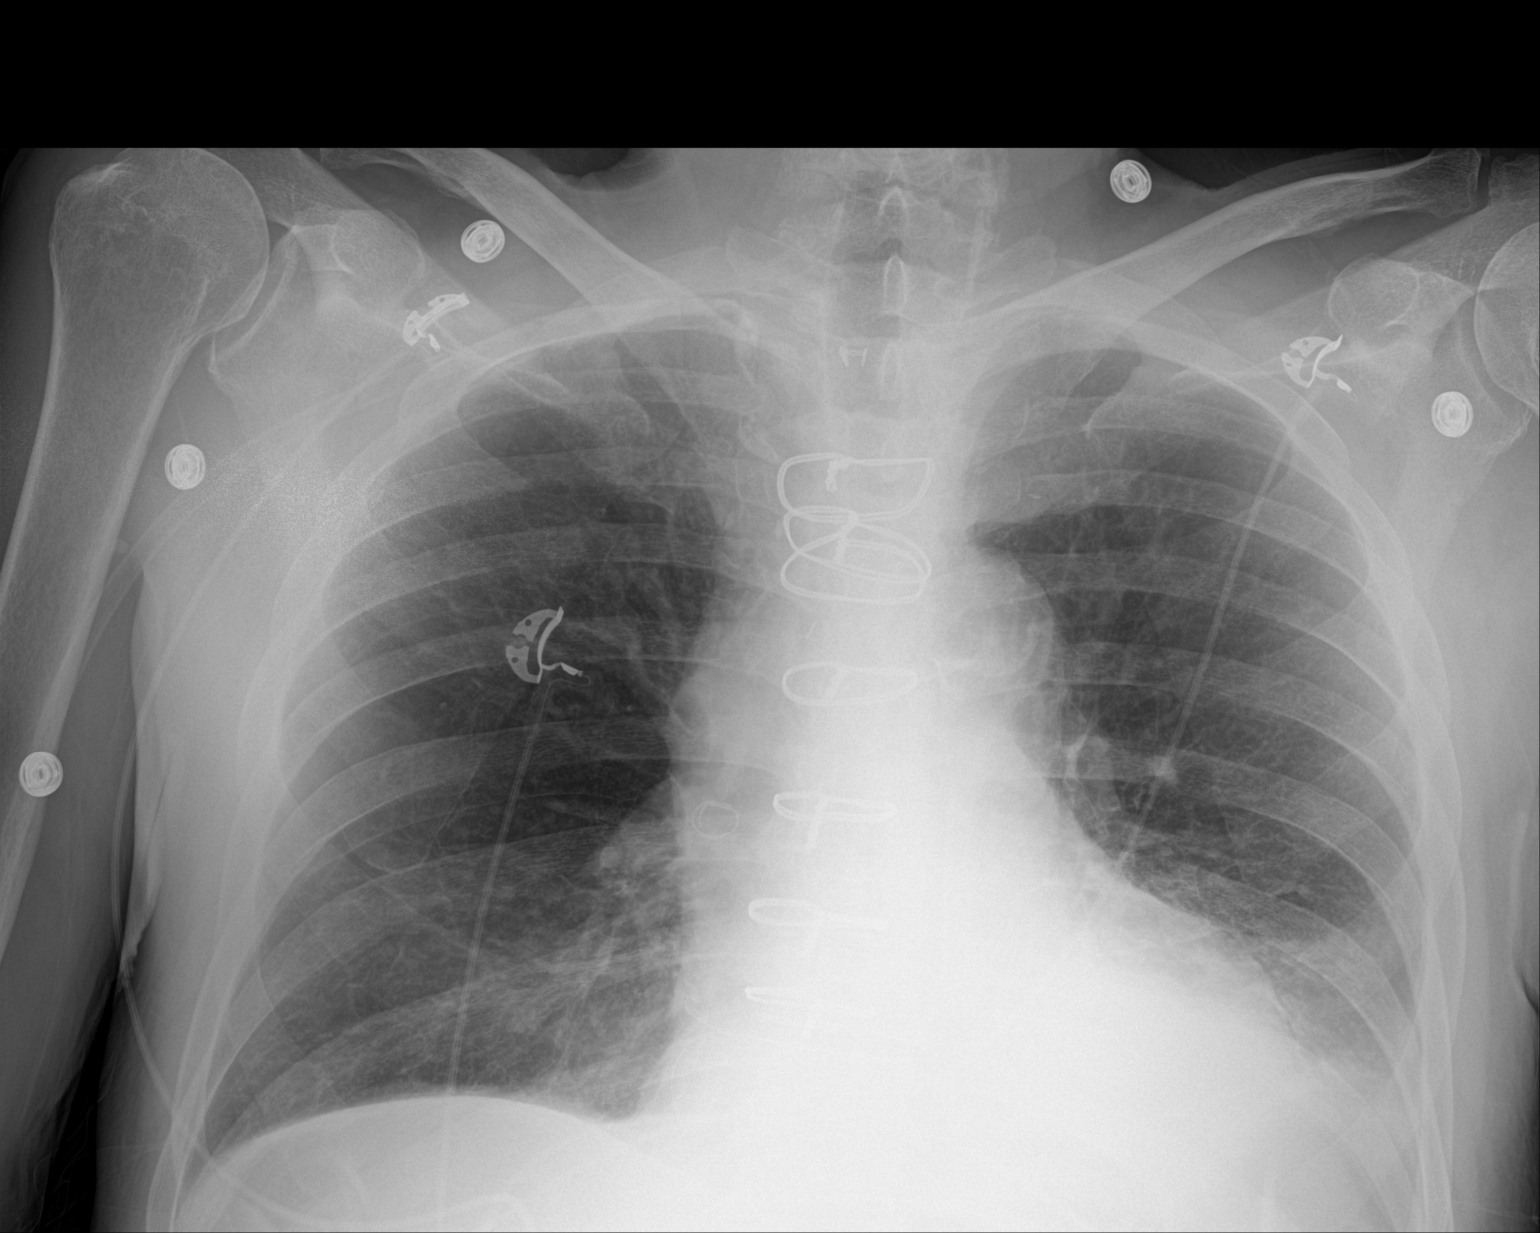

[2 of 2 positions shown; findings below may reference images not displayed]

FINDINGS: Cardiomediastinal silhouette is stable. Status post CABG again
noted. Right Swan-Ganz catheter has been removed. Persistent left
basilar atelectasis or infiltrate. No pulmonary edema. There is no
pneumothorax. Small left pleural effusion.
IMPRESSION: Right Swan-Ganz catheter has been removed. Status post CABG. No
pneumothorax. There is small left pleural effusion with left basilar
atelectasis or infiltrate. No pulmonary edema. No pneumothorax.

## 2015-11-23 ENCOUNTER — Telehealth: Payer: Self-pay | Admitting: Endocrinology

## 2015-11-23 MED ORDER — METFORMIN HCL 1000 MG PO TABS: 1000.0000 mg | ORAL_TABLET | Freq: Two times a day (BID) | ORAL | Status: DC

## 2015-11-23 MED ORDER — VICTOZA 18 MG/3ML ~~LOC~~ SOPN: 1.2000 mg | PEN_INJECTOR | Freq: Every day | SUBCUTANEOUS | Status: DC

## 2015-11-23 NOTE — Telephone Encounter (Signed)
PT needs his Victoza and Metformin refilled sent to [facility]

## 2015-11-23 NOTE — Telephone Encounter (Signed)
Rx submitted per pt's request.  

## 2015-11-25 ENCOUNTER — Telehealth: Payer: Self-pay | Admitting: Endocrinology

## 2015-11-25 MED ORDER — MIGLITOL 50 MG PO TABS: ORAL_TABLET | ORAL | Status: DC

## 2015-11-25 MED ORDER — VICTOZA 18 MG/3ML ~~LOC~~ SOPN: 1.2000 mg | PEN_INJECTOR | Freq: Every day | SUBCUTANEOUS | Status: DC

## 2015-11-25 MED ORDER — METFORMIN HCL 1000 MG PO TABS: 1000.0000 mg | ORAL_TABLET | Freq: Two times a day (BID) | ORAL | Status: DC

## 2015-11-25 NOTE — Telephone Encounter (Addendum)
See below. I have printed the Rx's and placed on your desk. Could you please sign? Thanks!

## 2015-11-25 NOTE — Telephone Encounter (Signed)
PT requests to have paper copies of prescriptions be printed for him to take to a different pharmacy, [facility] was too expensive. Victoza, Metformin, and Migitol. PT requests to pick up today if possible.

## 2015-11-26 MED ORDER — INSULIN PEN NEEDLE 31G X 8 MM MISC: Status: DC

## 2015-11-26 NOTE — Telephone Encounter (Signed)
Rx printed. Pt notified to come by and pick the rx up.

## 2015-11-26 NOTE — Telephone Encounter (Signed)
Pt needs another rx hard copy or call into [facility] for the ultra fine pen needles 4 mm x 32 g

## 2015-12-28 ENCOUNTER — Other Ambulatory Visit (INDEPENDENT_AMBULATORY_CARE_PROVIDER_SITE_OTHER): Payer: PPO

## 2015-12-28 DIAGNOSIS — E1165 Type 2 diabetes mellitus with hyperglycemia: Secondary | ICD-10-CM | POA: Diagnosis not present

## 2015-12-28 LAB — LIPID PANEL
Cholesterol: 106 mg/dL (ref 0–200)
HDL: 34.9 mg/dL — ABNORMAL LOW (ref >39.00)
LDL Cholesterol: 42 mg/dL (ref 0–99)
NonHDL: 70.87
Total CHOL/HDL Ratio: 3
Triglycerides: 143 mg/dL (ref 0.0–149.0)
VLDL: 28.6 mg/dL (ref 0.0–40.0)

## 2015-12-28 LAB — COMPREHENSIVE METABOLIC PANEL
ALT: 40 U/L (ref 0–53)
AST: 27 U/L (ref 0–37)
Albumin: 4.1 g/dL (ref 3.5–5.2)
Alkaline Phosphatase: 58 U/L (ref 39–117)
BUN: 26 mg/dL — ABNORMAL HIGH (ref 6–23)
CO2: 22 mEq/L (ref 19–32)
Calcium: 9.1 mg/dL (ref 8.4–10.5)
Chloride: 109 mEq/L (ref 96–112)
Creatinine, Ser: 1.21 mg/dL (ref 0.40–1.50)
GFR: 62.18 mL/min (ref >60.00)
Glucose, Bld: 128 mg/dL — ABNORMAL HIGH (ref 70–99)
Potassium: 4.2 mEq/L (ref 3.5–5.1)
Sodium: 138 mEq/L (ref 135–145)
Total Bilirubin: 1.1 mg/dL (ref 0.2–1.2)
Total Protein: 6.4 g/dL (ref 6.0–8.3)

## 2015-12-28 LAB — HEMOGLOBIN A1C: Hgb A1c MFr Bld: 7.6 % — ABNORMAL HIGH (ref 4.6–6.5)

## 2015-12-31 ENCOUNTER — Ambulatory Visit: Payer: PPO | Admitting: Endocrinology

## 2016-01-06 ENCOUNTER — Ambulatory Visit (INDEPENDENT_AMBULATORY_CARE_PROVIDER_SITE_OTHER): Payer: PPO | Admitting: Endocrinology

## 2016-01-06 ENCOUNTER — Encounter: Payer: Self-pay | Admitting: Endocrinology

## 2016-01-06 VITALS — BP 126/72 | HR 79 | Wt 164.0 lb

## 2016-01-06 DIAGNOSIS — E1165 Type 2 diabetes mellitus with hyperglycemia: Secondary | ICD-10-CM | POA: Diagnosis not present

## 2016-01-06 DIAGNOSIS — E1151 Type 2 diabetes mellitus with diabetic peripheral angiopathy without gangrene: Secondary | ICD-10-CM

## 2016-01-06 NOTE — Patient Instructions (Addendum)
Check blood sugars on waking up  2-3x weekly  Also check blood sugars about 2 hours after a meal and do this after different meals by rotation  Recommended blood sugar levels on waking up is 90-130 and about 2 hours after meal is 130-160  Please bring your blood sugar monitor to each visit, thank you  Add an egg/protein to Bfst

## 2016-01-06 NOTE — Progress Notes (Signed)
Patient ID: Mario [name], male   DOB: 1941-05-10, 75 y.o.   MRN: [id number]   Reason for Appointment: Diabetes follow-up   History of Present Illness   Diagnosis: Type 2 DIABETES MELITUS, date of diagnosis:  1989     Previous history: He was initially diagnosed when hospitalized for pancreatitis and his previous endocrinologist had treated him with multiple medications because of progression of his diabetes. He was also put on Cycloset  which he has tolerated maximum dose He has had adjustments of his medication dosages the last couple of years and Januvia was restarted in 5/14 when blood sugars were higher. Dosages have been adjusted based on renal function Previously an A1c levels have been ranging from 6.6-7.5 His metformin was increased  when renal function was better and glipizide was changed to glipizide ER 10 mg. Did not appear to have better blood sugar control with Invokana  Recent history:   Non-insulin hypoglycemic drugs:  Glyset bid, glipizide ER 10 mg , Metformin 1 g twice a day, Cycloset 4 tablets daily, Victoza 1.2 mg daily       His A1c was higher than usual and nearly 8% in 10/16 despite his multiple drug regimen He was started on Victoza for improved control   However his A1c is about the same at 7.6 recently  Blood sugar patterns and problems:  FASTING blood sugars are overall fairly good although fluctuating occasionally  He thinks that sometimes morning readings may be higher if eating sweets like ice cream at bedtime  Has only 4 readings after supper recently, only significantly high once  Has not done any readings after breakfast or lunch  He has not had any hypoglycemia   He is taking his GLYSET with breakfast and supper but usually does not take it when he is eating and occasional lunch meal or large snack   Weight has decreased slightly  He is still trying to be fairly active with various kinds of exercises and will also start doing some  swimming  He is frequently eating only cereal or some kind of bread in the morning without protein   Side effects from medications: None. Proper timing of medications in relation to meals: usually  Monitors blood glucose: Once or twice a day.    Glucometer: Freestyle    Blood Glucose readings from  Mean values apply above for all meters except median for One Touch  PRE-MEAL Fasting Lunch Dinner Bedtime Overall  Glucose range: 74-174    127-186    Mean/median: 136    152  139    Meals: 2-3 meals per day, no lunch often (yogurt); breakfast is cereal, [place] bacon, sometimes bread and juice at breakfast, sweets In the evening occasionally     Dietician 3/16       Physical activity: exercise:  going to the gym 3/7, Plays pickle ball     Wt Readings from Last 3 Encounters:  01/06/16 164 lb (74.4 kg)  10/02/15 167 lb 6.4 oz (75.9 kg)  09/18/15 163 lb (73.9 kg)    Lab Results  Component Value Date   HGBA1C 7.6 (H) 12/28/2015   HGBA1C 7.7 (H) 09/30/2015   HGBA1C 7.4 (H) 07/02/2015   Lab Results  Component Value Date   MICROALBUR 14.7 (H) 02/26/2015   LDLCALC 42 12/28/2015   CREATININE 1.21 12/28/2015     LABS:  No visits with results within 1 Week(s) from this visit.  Latest known visit with results is:  Lab on  12/28/2015  Component Date Value Ref Range Status  . Hgb A1c MFr Bld 12/28/2015 7.6* 4.6 - 6.5 % Final  . Sodium 12/28/2015 138  135 - 145 mEq/L Final  . Potassium 12/28/2015 4.2  3.5 - 5.1 mEq/L Final  . Chloride 12/28/2015 109  96 - 112 mEq/L Final  . CO2 12/28/2015 22  19 - 32 mEq/L Final  . Glucose, Bld 12/28/2015 128* 70 - 99 mg/dL Final  . BUN 12/28/2015 26* 6 - 23 mg/dL Final  . Creatinine, Ser 12/28/2015 1.21  0.40 - 1.50 mg/dL Final  . Total Bilirubin 12/28/2015 1.1  0.2 - 1.2 mg/dL Final  . Alkaline Phosphatase 12/28/2015 58  39 - 117 U/L Final  . AST 12/28/2015 27  0 - 37 U/L Final  . ALT 12/28/2015 40  0 - 53 U/L Final  . Total Protein 12/28/2015  6.4  6.0 - 8.3 g/dL Final  . Albumin 12/28/2015 4.1  3.5 - 5.2 g/dL Final  . Calcium 12/28/2015 9.1  8.4 - 10.5 mg/dL Final  . GFR 12/28/2015 62.18  >60.00 mL/min Final  . Cholesterol 12/28/2015 106  0 - 200 mg/dL Final  . Triglycerides 12/28/2015 143.0  0.0 - 149.0 mg/dL Final  . HDL 12/28/2015 34.90* >39.00 mg/dL Final  . VLDL 12/28/2015 28.6  0.0 - 40.0 mg/dL Final  . LDL Cholesterol 12/28/2015 42  0 - 99 mg/dL Final  . Total CHOL/HDL Ratio 12/28/2015 3   Final  . NonHDL 12/28/2015 70.87   Final      Medication List       Accurate as of 01/06/16  8:43 PM. Always use your most recent med list.          ACCU-CHEK SMARTVIEW CONTROL Liqd Use as directed   acetaminophen 500 MG tablet Commonly known as:  TYLENOL Take 500 mg by mouth daily as needed.   allopurinol 100 MG tablet Commonly known as:  ZYLOPRIM Take 1 tablet (100 mg total) by mouth daily.   aspirin 325 MG EC tablet Take 1 tablet (325 mg total) by mouth daily.   atorvastatin 40 MG tablet Commonly known as:  LIPITOR TAKE 1 TABLET EVERY DAY AT  6 PM   Bromocriptine Mesylate 0.8 MG Tabs Take 4 mg by mouth daily.   carvedilol 3.125 MG tablet Commonly known as:  COREG Take 1 tablet (3.125 mg total) by mouth 2 (two) times daily.   cholecalciferol 1000 units tablet Commonly known as:  VITAMIN D Take 2,000 Units by mouth daily.   Co-Enzyme Q-10 100 MG Caps Take 100 mg by mouth daily.   ferrous sulfate 325 (65 FE) MG tablet Take 325 mg by mouth daily.   finasteride 5 MG tablet Commonly known as:  PROSCAR Take 5 mg by mouth daily.   fluorouracil 5 % cream Commonly known as:  EFUDEX Apply 1 application topically daily as needed (skin irritations). Reported on 2/95/6213   folic acid 086 MCG tablet Commonly known as:  FOLVITE Take 800 mcg by mouth daily.   FREESTYLE FREEDOM LITE w/Device Kit Use as directed to check blood sugar 2 times per day dx code E11.65   freestyle lancets Use as instructed to  check blood sugar 2 times per day dx code E11.65   glipiZIDE 10 MG 24 hr tablet Commonly known as:  GLUCOTROL XL Take 1 tablet (10 mg total) by mouth at bedtime.   glucose blood test strip Commonly known as:  FREESTYLE LITE Use as instructed to check blood sugar  2 times per day dx code E11.65   Insulin Pen Needle 31G X 8 MM Misc Use to inject insulin 1 time per day   ipratropium-albuterol 0.5-2.5 (3) MG/3ML Soln Commonly known as:  DUONEB Take 3 mLs by nebulization 2 (two) times daily.   lisinopril 5 MG tablet Commonly known as:  PRINIVIL,ZESTRIL Take 1 tablet (5 mg total) by mouth daily.   metFORMIN 1000 MG tablet Commonly known as:  GLUCOPHAGE Take 1 tablet (1,000 mg total) by mouth 2 (two) times daily with a meal.   miglitol 50 MG tablet Commonly known as:  GLYSET Take 1 tablet by mouth two times daily dx code E11.65   NITROSTAT 0.4 MG SL tablet Generic drug:  nitroGLYCERIN PLACE 1 TABLET (0.4 MG TOTAL) UNDER THE TONGUE EVERY 5 (FIVE) MINUTES AS NEEDED.   omega-3 acid ethyl esters 1 g capsule Commonly known as:  LOVAZA Take 1 g by mouth daily.   PRESERVISION AREDS 2 Caps Take 2 capsules by mouth daily. Reported on 09/18/2015   tamsulosin 0.4 MG Caps capsule Commonly known as:  FLOMAX Take 1 capsule (0.4 mg total) by mouth daily.   VICTOZA 18 MG/3ML Sopn Generic drug:  Liraglutide Inject 0.2 mLs (1.2 mg total) into the skin daily. Inject once daily at the same time   vitamin B-12 500 MCG tablet Commonly known as:  CYANOCOBALAMIN Take 1,000 mcg by mouth daily.   vitamin E 100 UNIT capsule Take 100 Units by mouth daily.       Allergies:  Allergies  Allergen Reactions  . Penicillins Anaphylaxis  . Plavix [Clopidogrel Bisulfate] Hives  . Pletal [Cilostazol] Itching    Past Medical History:  Diagnosis Date  . Arthritis   . CAD (coronary artery disease)    a.  s/p IMI and BMS 1997;  b. Myoview 4/16:  inf scar with peri-infarct ischemia, EF 34%; high  risk;  c. LHC 5/16:  3 v CAD >> CABG (free L-LAD, S-OM, S-dRCA)  . Carotid stenosis    a. carotid US 6/16:  bilat ICA 1-39%  . Chronic kidney disease, stage II (mild)   . Chronic systolic CHF (congestive heart failure) ([place])   . Essential hypertension, benign   . Gout   . HLD (hyperlipidemia)   . Ischemic cardiomyopathy    a. EF by myoview 4/16 34%; b. LHC 5/16: EF 40-45%;  c. intraop TEE 6/16: EF 45-50%  . Orthostasis   . PAD (peripheral artery disease) ([facility])    Dr. Fletcher Anon  . Pneumonia    hx of  . Type II or unspecified type diabetes mellitus without mention of complication, uncontrolled     Past Surgical History:  Procedure Laterality Date  . ABDOMINAL AORTAGRAM N/A 10/09/2013   Procedure: ABDOMINAL Maxcine Ham;  Surgeon: Wellington Hampshire, MD;  Location: [facility] CATH LAB;  Service: Cardiovascular;  Laterality: N/A;  . CARDIAC CATHETERIZATION N/A 10/02/2014   Procedure: Left Heart Cath and Coronary Angiography;  Surgeon: Belva Crome, MD;  Location: [facility] CV LAB;  Service: Cardiovascular;  Laterality: N/A;  . CATARACT EXTRACTION    . CHOLECYSTECTOMY    . COLONOSCOPY    . CORONARY ARTERY BYPASS GRAFT N/A 11/03/2014   Procedure: CORONARY ARTERY BYPASS GRAFTING  times three using left internal mammary and right greater saphenous vein;  Surgeon: Gaye Pollack, MD;  Location: [facility] OR;  Service: Open Heart Surgery;  Laterality: N/A;  . HERNIA REPAIR    . MELANOMA EXCISION    . SKIN LESION  EXCISION     multiple  . TEE WITHOUT CARDIOVERSION N/A 11/03/2014   Procedure: TRANSESOPHAGEAL ECHOCARDIOGRAM (TEE);  Surgeon: Gaye Pollack, MD;  Location: [facility];  Service: Open Heart Surgery;  Laterality: N/A;    Family History  Problem Relation Age of Onset  . Colon cancer Father   . Heart disease Mother     Social History:  reports that he quit smoking about 14 months ago. His smoking use included Cigarettes. He has a 25.00 pack-year smoking history. He has never used smokeless tobacco. He  reports that he does not drink alcohol or use drugs.  Review of Systems:  He will tend to get some pain on his right lower leg if he is walking significantly, has known peripheral arterial disease  Hypertension:  currently taking lisinopril 5 mg and carvedilol low-dose and blood pressure is well-controlled, managed by cardiologist  LIVER Function abnormality: Resolved   Lab Results  Component Value Date   ALT 40 12/28/2015    Lipids: Well controlled with atorvastatin 40 mg .  HDL is  usually low, triglycerides normal   Lab Results  Component Value Date   CHOL 106 12/28/2015   HDL 34.90 (L) 12/28/2015   LDLCALC 42 12/28/2015   TRIG 143.0 12/28/2015   CHOLHDL 3 12/28/2015    Foot exam done in 7/16, has callus formation on the left and significant sensory loss as well as decreased pulses    Examination:   BP 126/72   Pulse 79   Wt 164 lb (74.4 kg)   BMI 21.06 kg/m   Body mass index is 21.06 kg/m.   Diabetic Foot Exam - Simple   Simple Foot Form Diabetic Foot exam was performed with the following findings:  Yes 01/06/2016 11:36 AM  Visual Inspection See comments:  Yes Sensation Testing See comments:  Yes Pulse Check See comments:  Yes Comments Calluses lateral ball of feet, absent pulses, minimal sensation in toes     ASSESSMENT/ PLAN:   Diabetes type 2:  See history of present illness for detailed discussion of his current management, blood sugar patterns and problems identified  Blood glucose control is only fair With A1c Is Still over 7% Not checking enough specially after breakfast or any afternoon meals However considering his age and duration of diabetes blood sugars are reasonably well controlled Again is on multiple medications including Victoza aren't Glyset  Reminded him to take Glyset when he is having lunch and needs to take this with him More readings after dinner are desirable Also discussed adding some protein to each meal especially  breakfast  Neuropathy: Discussed general foot care.  Peripheral arterial disease with absent pedal pulses: Has only mild symptoms and no  ischemic signs  We will see him back in 3 months with another A1c   Patient Instructions   Check blood sugars on waking up  2-3x weekly  Also check blood sugars about 2 hours after a meal and do this after different meals by rotation  Recommended blood sugar levels on waking up is 90-130 and about 2 hours after meal is 130-160  Please bring your blood sugar monitor to each visit, thank you  Add an egg/protein to Bfst  No visits with results within 1 Week(s) from this visit.  Latest known visit with results is:  Lab on 12/28/2015  Component Date Value Ref Range Status  . Hgb A1c MFr Bld 12/28/2015 7.6* 4.6 - 6.5 % Final  . Sodium 12/28/2015 138  135 -  145 mEq/L Final  . Potassium 12/28/2015 4.2  3.5 - 5.1 mEq/L Final  . Chloride 12/28/2015 109  96 - 112 mEq/L Final  . CO2 12/28/2015 22  19 - 32 mEq/L Final  . Glucose, Bld 12/28/2015 128* 70 - 99 mg/dL Final  . BUN 12/28/2015 26* 6 - 23 mg/dL Final  . Creatinine, Ser 12/28/2015 1.21  0.40 - 1.50 mg/dL Final  . Total Bilirubin 12/28/2015 1.1  0.2 - 1.2 mg/dL Final  . Alkaline Phosphatase 12/28/2015 58  39 - 117 U/L Final  . AST 12/28/2015 27  0 - 37 U/L Final  . ALT 12/28/2015 40  0 - 53 U/L Final  . Total Protein 12/28/2015 6.4  6.0 - 8.3 g/dL Final  . Albumin 12/28/2015 4.1  3.5 - 5.2 g/dL Final  . Calcium 12/28/2015 9.1  8.4 - 10.5 mg/dL Final  . GFR 12/28/2015 62.18  >60.00 mL/min Final  . Cholesterol 12/28/2015 106  0 - 200 mg/dL Final  . Triglycerides 12/28/2015 143.0  0.0 - 149.0 mg/dL Final  . HDL 12/28/2015 34.90* >39.00 mg/dL Final  . VLDL 12/28/2015 28.6  0.0 - 40.0 mg/dL Final  . LDL Cholesterol 12/28/2015 42  0 - 99 mg/dL Final  . Total CHOL/HDL Ratio 12/28/2015 3   Final  . NonHDL 12/28/2015 70.87   Final          , 01/06/2016, 8:43 PM

## 2016-01-26 ENCOUNTER — Other Ambulatory Visit: Payer: Self-pay | Admitting: Physician Assistant

## 2016-01-26 DIAGNOSIS — I10 Essential (primary) hypertension: Secondary | ICD-10-CM

## 2016-02-01 DIAGNOSIS — C44519 Basal cell carcinoma of skin of other part of trunk: Secondary | ICD-10-CM | POA: Diagnosis not present

## 2016-02-01 DIAGNOSIS — C44612 Basal cell carcinoma of skin of right upper limb, including shoulder: Secondary | ICD-10-CM | POA: Diagnosis not present

## 2016-02-01 DIAGNOSIS — D485 Neoplasm of uncertain behavior of skin: Secondary | ICD-10-CM | POA: Diagnosis not present

## 2016-02-01 DIAGNOSIS — D225 Melanocytic nevi of trunk: Secondary | ICD-10-CM | POA: Diagnosis not present

## 2016-02-05 DIAGNOSIS — N5201 Erectile dysfunction due to arterial insufficiency: Secondary | ICD-10-CM | POA: Diagnosis not present

## 2016-02-05 DIAGNOSIS — R351 Nocturia: Secondary | ICD-10-CM | POA: Diagnosis not present

## 2016-02-05 DIAGNOSIS — N401 Enlarged prostate with lower urinary tract symptoms: Secondary | ICD-10-CM | POA: Diagnosis not present

## 2016-02-09 DIAGNOSIS — Z7984 Long term (current) use of oral hypoglycemic drugs: Secondary | ICD-10-CM | POA: Diagnosis not present

## 2016-02-09 DIAGNOSIS — N183 Chronic kidney disease, stage 3 (moderate): Secondary | ICD-10-CM | POA: Diagnosis not present

## 2016-02-09 DIAGNOSIS — I779 Disorder of arteries and arterioles, unspecified: Secondary | ICD-10-CM | POA: Diagnosis not present

## 2016-02-09 DIAGNOSIS — E133599 Other specified diabetes mellitus with proliferative diabetic retinopathy without macular edema, unspecified eye: Secondary | ICD-10-CM | POA: Diagnosis not present

## 2016-02-09 DIAGNOSIS — N401 Enlarged prostate with lower urinary tract symptoms: Secondary | ICD-10-CM | POA: Diagnosis not present

## 2016-02-09 DIAGNOSIS — E7800 Pure hypercholesterolemia, unspecified: Secondary | ICD-10-CM | POA: Diagnosis not present

## 2016-02-09 DIAGNOSIS — E11319 Type 2 diabetes mellitus with unspecified diabetic retinopathy without macular edema: Secondary | ICD-10-CM | POA: Diagnosis not present

## 2016-02-09 DIAGNOSIS — Z23 Encounter for immunization: Secondary | ICD-10-CM | POA: Diagnosis not present

## 2016-02-09 DIAGNOSIS — I2510 Atherosclerotic heart disease of native coronary artery without angina pectoris: Secondary | ICD-10-CM | POA: Diagnosis not present

## 2016-02-22 DIAGNOSIS — M545 Low back pain: Secondary | ICD-10-CM | POA: Diagnosis not present

## 2016-02-22 DIAGNOSIS — M50322 Other cervical disc degeneration at C5-C6 level: Secondary | ICD-10-CM | POA: Diagnosis not present

## 2016-02-22 DIAGNOSIS — M5137 Other intervertebral disc degeneration, lumbosacral region: Secondary | ICD-10-CM | POA: Diagnosis not present

## 2016-02-22 DIAGNOSIS — M25511 Pain in right shoulder: Secondary | ICD-10-CM | POA: Diagnosis not present

## 2016-02-22 DIAGNOSIS — M50323 Other cervical disc degeneration at C6-C7 level: Secondary | ICD-10-CM | POA: Diagnosis not present

## 2016-02-22 DIAGNOSIS — M19011 Primary osteoarthritis, right shoulder: Secondary | ICD-10-CM | POA: Diagnosis not present

## 2016-02-22 DIAGNOSIS — M5136 Other intervertebral disc degeneration, lumbar region: Secondary | ICD-10-CM | POA: Diagnosis not present

## 2016-02-24 DIAGNOSIS — M542 Cervicalgia: Secondary | ICD-10-CM | POA: Diagnosis not present

## 2016-02-24 DIAGNOSIS — M791 Myalgia: Secondary | ICD-10-CM | POA: Diagnosis not present

## 2016-02-24 DIAGNOSIS — M4602 Spinal enthesopathy, cervical region: Secondary | ICD-10-CM | POA: Diagnosis not present

## 2016-02-26 ENCOUNTER — Telehealth: Payer: Self-pay | Admitting: Endocrinology

## 2016-02-26 ENCOUNTER — Other Ambulatory Visit: Payer: Self-pay | Admitting: *Deleted

## 2016-02-26 MED ORDER — ALLOPURINOL 100 MG PO TABS: 100.0000 mg | ORAL_TABLET | Freq: Every day | ORAL | 1 refills | Status: DC

## 2016-02-26 MED ORDER — ATORVASTATIN CALCIUM 40 MG PO TABS: ORAL_TABLET | ORAL | 1 refills | Status: DC

## 2016-02-26 MED ORDER — GLIPIZIDE ER 10 MG PO TB24: 10.0000 mg | ORAL_TABLET | Freq: Every day | ORAL | 1 refills | Status: DC

## 2016-02-26 NOTE — Telephone Encounter (Signed)
Pt also needs [place] to call allopurinol, atorvastatin, and glipizide called to [facility] pt is almost out of all three

## 2016-02-26 NOTE — Telephone Encounter (Signed)
Patient pharmacy requested a tier reduction because to expensivein medication VICTOZA 18 MG/3ML SOPN, please advise  [facility] - [place], [place] [phone] (Phone) [phone] (Fax)

## 2016-02-26 NOTE — Telephone Encounter (Signed)
They have all been sent to his mail order pharmacy.

## 2016-02-29 DIAGNOSIS — M545 Low back pain: Secondary | ICD-10-CM | POA: Diagnosis not present

## 2016-02-29 DIAGNOSIS — M4606 Spinal enthesopathy, lumbar region: Secondary | ICD-10-CM | POA: Diagnosis not present

## 2016-02-29 DIAGNOSIS — M791 Myalgia: Secondary | ICD-10-CM | POA: Diagnosis not present

## 2016-02-29 NOTE — Telephone Encounter (Signed)
Hilda Blades from [facility] calling with question about patient medication. Please advise. [phone] ref # [id number]

## 2016-03-02 ENCOUNTER — Other Ambulatory Visit: Payer: Self-pay | Admitting: *Deleted

## 2016-03-02 MED ORDER — ATORVASTATIN CALCIUM 40 MG PO TABS: ORAL_TABLET | ORAL | 1 refills | Status: DC

## 2016-03-02 NOTE — Telephone Encounter (Signed)
Patient need a refill for atorvastatin (LIPITOR) 40 MG tablet.    [facility] [address], [place] [phone] (Phone) [phone] (Fax)   Please today if possible

## 2016-03-02 NOTE — Telephone Encounter (Signed)
Rx sent 

## 2016-04-01 ENCOUNTER — Other Ambulatory Visit: Payer: PPO

## 2016-04-04 ENCOUNTER — Other Ambulatory Visit: Payer: Self-pay | Admitting: Interventional Cardiology

## 2016-04-05 ENCOUNTER — Other Ambulatory Visit (INDEPENDENT_AMBULATORY_CARE_PROVIDER_SITE_OTHER): Payer: PPO

## 2016-04-05 DIAGNOSIS — E1165 Type 2 diabetes mellitus with hyperglycemia: Secondary | ICD-10-CM

## 2016-04-05 LAB — COMPREHENSIVE METABOLIC PANEL
ALT: 46 U/L (ref 0–53)
AST: 29 U/L (ref 0–37)
Albumin: 3.9 g/dL (ref 3.5–5.2)
Alkaline Phosphatase: 65 U/L (ref 39–117)
BUN: 25 mg/dL — ABNORMAL HIGH (ref 6–23)
CO2: 22 mEq/L (ref 19–32)
Calcium: 9 mg/dL (ref 8.4–10.5)
Chloride: 110 mEq/L (ref 96–112)
Creatinine, Ser: 1.14 mg/dL (ref 0.40–1.50)
GFR: 66.56 mL/min (ref >60.00)
Glucose, Bld: 143 mg/dL — ABNORMAL HIGH (ref 70–99)
Potassium: 4.6 mEq/L (ref 3.5–5.1)
Sodium: 138 mEq/L (ref 135–145)
Total Bilirubin: 0.8 mg/dL (ref 0.2–1.2)
Total Protein: 6.1 g/dL (ref 6.0–8.3)

## 2016-04-05 LAB — HEMOGLOBIN A1C: Hgb A1c MFr Bld: 7.4 % — ABNORMAL HIGH (ref 4.6–6.5)

## 2016-04-06 ENCOUNTER — Encounter: Payer: Self-pay | Admitting: Endocrinology

## 2016-04-06 ENCOUNTER — Ambulatory Visit (INDEPENDENT_AMBULATORY_CARE_PROVIDER_SITE_OTHER): Payer: PPO | Admitting: Endocrinology

## 2016-04-06 VITALS — BP 112/58 | Ht 72.0 in | Wt 165.0 lb

## 2016-04-06 DIAGNOSIS — E1165 Type 2 diabetes mellitus with hyperglycemia: Secondary | ICD-10-CM | POA: Diagnosis not present

## 2016-04-06 DIAGNOSIS — E1142 Type 2 diabetes mellitus with diabetic polyneuropathy: Secondary | ICD-10-CM | POA: Diagnosis not present

## 2016-04-06 LAB — URINALYSIS, ROUTINE W REFLEX MICROSCOPIC
Bilirubin Urine: NEGATIVE
Hgb urine dipstick: NEGATIVE
Ketones, ur: NEGATIVE
Leukocytes, UA: NEGATIVE
Nitrite: NEGATIVE
RBC / HPF: NONE SEEN (ref 0–?)
Specific Gravity, Urine: 1.02 (ref 1.000–1.030)
Total Protein, Urine: NEGATIVE
Urine Glucose: NEGATIVE
Urobilinogen, UA: 0.2 (ref 0.0–1.0)
pH: 5.5 (ref 5.0–8.0)

## 2016-04-06 LAB — MICROALBUMIN / CREATININE URINE RATIO
Creatinine,U: 116.6 mg/dL
Microalb Creat Ratio: 7.5 mg/g (ref 0.0–30.0)
Microalb, Ur: 8.7 mg/dL — ABNORMAL HIGH (ref 0.0–1.9)

## 2016-04-06 NOTE — Patient Instructions (Addendum)
Take GLYSET just before each meal even when eating out  Cycloset early am before meal  Check blood sugars on waking up  3x weekly  Also check blood sugars about 2 hours after a meal and do this after different meals by rotation  Recommended blood sugar levels on waking up is 90-130 and about 2 hours after meal is 130-160  Please bring your blood sugar monitor to each visit, thank you

## 2016-04-06 NOTE — Progress Notes (Signed)
Patient ID: Mario [name], male   DOB: 08/17/1940, 75 y.o.   MRN: [id number]   Reason for Appointment: Diabetes follow-up   History of Present Illness   Diagnosis: Type 2 DIABETES MELITUS, date of diagnosis:  1989     Previous history: He was initially diagnosed when hospitalized for pancreatitis and his previous endocrinologist had treated him with multiple medications because of progression of his diabetes. He was also put on Cycloset  which he has tolerated maximum dose He has had adjustments of his medication dosages the last couple of years and Januvia was restarted in 5/14 when blood sugars were higher. Dosages have been adjusted based on renal function Previously an A1c levels have been ranging from 6.6-7.5 His metformin was increased  when renal function was better and glipizide was changed to glipizide ER 10 mg. Did not appear to have better blood sugar control with Invokana  Recent history:   Non-insulin hypoglycemic drugs:  Glyset bid, glipizide ER 10 mg , Metformin 1 g twice a day, Cycloset 4 tablets daily, Victoza 1.2 mg daily       His A1c was higher than usual and nearly 8% in 10/16 despite his multiple drug regimen He was started on Victoza for improved control   His A1c is slightly better now at 7.4, previously 7.6  Blood sugar patterns and problems:  FASTING blood sugars are fluctuating  He thinks that blood sugars are variable because of his traveling, lack of consistent exercise, eating out more and occasionally forgetting his medications been traveling  Also despite reminders he still does not take his Glyset when he is going out to eat  Not clear if he is taking his Cycloset in the mornings before breakfast, may be taking it together after eating  Weight is about the same  Forgets to check his blood sugars after supper  Side effects from medications: None. Proper timing of medications in relation to meals: usually  Monitors blood glucose: Once  or twice a day.    Glucometer: Freestyle    Blood Glucose readings from download  Mean values apply above for all meters except median for One Touch  PRE-MEAL Fasting Lunch 2 PM  Bedtime Overall  Glucose range: 97-195 140-159  169     Mean/median: 154    151    Meals: 2-3 meals per day, no lunch often (yogurt); breakfast is cereal, [place] bacon, sometimes bread and juice at breakfast, sweets In the evening occasionally     Dietician 3/16       Physical activity: exercise:  going to the gym 3/7, Plays pickle ball, bike     Wt Readings from Last 3 Encounters:  04/06/16 165 lb (74.8 kg)  01/06/16 164 lb (74.4 kg)  10/02/15 167 lb 6.4 oz (75.9 kg)    Lab Results  Component Value Date   HGBA1C 7.4 (H) 04/05/2016   HGBA1C 7.6 (H) 12/28/2015   HGBA1C 7.7 (H) 09/30/2015   Lab Results  Component Value Date   MICROALBUR 14.7 (H) 02/26/2015   LDLCALC 42 12/28/2015   CREATININE 1.14 04/05/2016     LABS:  Lab on 04/05/2016  Component Date Value Ref Range Status  . Hgb A1c MFr Bld 04/05/2016 7.4* 4.6 - 6.5 % Final  . Sodium 04/05/2016 138  135 - 145 mEq/L Final  . Potassium 04/05/2016 4.6  3.5 - 5.1 mEq/L Final  . Chloride 04/05/2016 110  96 - 112 mEq/L Final  . CO2 04/05/2016 22  19 -  32 mEq/L Final  . Glucose, Bld 04/05/2016 143* 70 - 99 mg/dL Final  . BUN 04/05/2016 25* 6 - 23 mg/dL Final  . Creatinine, Ser 04/05/2016 1.14  0.40 - 1.50 mg/dL Final  . Total Bilirubin 04/05/2016 0.8  0.2 - 1.2 mg/dL Final  . Alkaline Phosphatase 04/05/2016 65  39 - 117 U/L Final  . AST 04/05/2016 29  0 - 37 U/L Final  . ALT 04/05/2016 46  0 - 53 U/L Final  . Total Protein 04/05/2016 6.1  6.0 - 8.3 g/dL Final  . Albumin 04/05/2016 3.9  3.5 - 5.2 g/dL Final  . Calcium 04/05/2016 9.0  8.4 - 10.5 mg/dL Final  . GFR 04/05/2016 66.56  >60.00 mL/min Final      Medication List       Accurate as of 04/06/16 10:26 AM. Always use your most recent med list.          acetaminophen 500 MG  tablet Commonly known as:  TYLENOL Take 500 mg by mouth daily as needed.   allopurinol 100 MG tablet Commonly known as:  ZYLOPRIM Take 1 tablet (100 mg total) by mouth daily.   aspirin 325 MG EC tablet Take 1 tablet (325 mg total) by mouth daily.   atorvastatin 40 MG tablet Commonly known as:  LIPITOR TAKE 1 TABLET EVERY DAY AT  6 PM   Bromocriptine Mesylate 0.8 MG Tabs Take 4 mg by mouth daily.   carvedilol 3.125 MG tablet Commonly known as:  COREG TAKE 1 TABLET (3.125 MG TOTAL) BY MOUTH 2 (TWO) TIMES DAILY.   cholecalciferol 1000 units tablet Commonly known as:  VITAMIN D Take 2,000 Units by mouth daily.   Co-Enzyme Q-10 100 MG Caps Take 100 mg by mouth daily.   ferrous sulfate 325 (65 FE) MG tablet Take 325 mg by mouth daily.   finasteride 5 MG tablet Commonly known as:  PROSCAR Take 5 mg by mouth daily.   fluorouracil 5 % cream Commonly known as:  EFUDEX Apply 1 application topically daily as needed (skin irritations). Reported on 9/47/0962   folic acid 836 MCG tablet Commonly known as:  FOLVITE Take 800 mcg by mouth daily.   FREESTYLE FREEDOM LITE w/Device Kit Use as directed to check blood sugar 2 times per day dx code E11.65   freestyle lancets Use as instructed to check blood sugar 2 times per day dx code E11.65   glipiZIDE 10 MG 24 hr tablet Commonly known as:  GLUCOTROL XL Take 1 tablet (10 mg total) by mouth at bedtime.   glucose blood test strip Commonly known as:  FREESTYLE LITE Use as instructed to check blood sugar 2 times per day dx code E11.65   Insulin Pen Needle 31G X 8 MM Misc Use to inject insulin 1 time per day   ipratropium-albuterol 0.5-2.5 (3) MG/3ML Soln Commonly known as:  DUONEB Take 3 mLs by nebulization 2 (two) times daily.   lisinopril 5 MG tablet Commonly known as:  PRINIVIL,ZESTRIL TAKE 1 TABLET BY MOUTH EVERY DAY   metFORMIN 1000 MG tablet Commonly known as:  GLUCOPHAGE Take 1 tablet (1,000 mg total) by mouth 2  (two) times daily with a meal.   miglitol 50 MG tablet Commonly known as:  GLYSET Take 1 tablet by mouth two times daily dx code E11.65   NITROSTAT 0.4 MG SL tablet Generic drug:  nitroGLYCERIN PLACE 1 TABLET (0.4 MG TOTAL) UNDER THE TONGUE EVERY 5 (FIVE) MINUTES AS NEEDED.   omega-3 acid ethyl  esters 1 g capsule Commonly known as:  LOVAZA Take 1 g by mouth daily.   PRESERVISION AREDS 2 Caps Take 2 capsules by mouth daily. Reported on 09/18/2015   tamsulosin 0.4 MG Caps capsule Commonly known as:  FLOMAX Take 1 capsule (0.4 mg total) by mouth daily.   VICTOZA 18 MG/3ML Sopn Generic drug:  liraglutide Inject 0.2 mLs (1.2 mg total) into the skin daily. Inject once daily at the same time   vitamin B-12 500 MCG tablet Commonly known as:  CYANOCOBALAMIN Take 1,000 mcg by mouth daily.   vitamin E 100 UNIT capsule Take 100 Units by mouth daily.       Allergies:  Allergies  Allergen Reactions  . Penicillins Anaphylaxis  . Plavix [Clopidogrel Bisulfate] Hives  . Pletal [Cilostazol] Itching    Past Medical History:  Diagnosis Date  . Arthritis   . CAD (coronary artery disease)    a.  s/p IMI and BMS 1997;  b. Myoview 4/16:  inf scar with peri-infarct ischemia, EF 34%; high risk;  c. LHC 5/16:  3 v CAD >> CABG (free L-LAD, S-OM, S-dRCA)  . Carotid stenosis    a. carotid US 6/16:  bilat ICA 1-39%  . Chronic kidney disease, stage II (mild)   . Chronic systolic CHF (congestive heart failure) ([place])   . Essential hypertension, benign   . Gout   . HLD (hyperlipidemia)   . Ischemic cardiomyopathy    a. EF by myoview 4/16 34%; b. LHC 5/16: EF 40-45%;  c. intraop TEE 6/16: EF 45-50%  . Orthostasis   . PAD (peripheral artery disease) ([facility])    Dr. Fletcher Anon  . Pneumonia    hx of  . Type II or unspecified type diabetes mellitus without mention of complication, uncontrolled     Past Surgical History:  Procedure Laterality Date  . ABDOMINAL AORTAGRAM N/A 10/09/2013   Procedure:  ABDOMINAL Maxcine Ham;  Surgeon: Wellington Hampshire, MD;  Location: [facility];  Service: Cardiovascular;  Laterality: N/A;  . CARDIAC CATHETERIZATION N/A 10/02/2014   Procedure: Left Heart Cath and Coronary Angiography;  Surgeon: Belva Crome, MD;  Location: [facility];  Service: Cardiovascular;  Laterality: N/A;  . CATARACT EXTRACTION    . CHOLECYSTECTOMY    . COLONOSCOPY    . CORONARY ARTERY BYPASS GRAFT N/A 11/03/2014   Procedure: CORONARY ARTERY BYPASS GRAFTING  times three using left internal mammary and right greater saphenous vein;  Surgeon: Gaye Pollack, MD;  Location: [place] [facility];  Service: Open Heart Surgery;  Laterality: N/A;  . HERNIA REPAIR    . MELANOMA EXCISION    . SKIN LESION EXCISION     multiple  . TEE WITHOUT CARDIOVERSION N/A 11/03/2014   Procedure: TRANSESOPHAGEAL ECHOCARDIOGRAM (TEE);  Surgeon: Gaye Pollack, MD;  Location: [facility];  Service: Open Heart Surgery;  Laterality: N/A;    Family History  Problem Relation Age of Onset  . Colon cancer Father   . Heart disease Mother     Social History:  reports that he quit smoking about 17 months ago. His smoking use included Cigarettes. He has a 25.00 pack-year smoking history. He has never used smokeless tobacco. He reports that he does not drink alcohol or use drugs.  Review of Systems:   Hypertension:  currently taking lisinopril 5 mg and carvedilol low-dose and blood pressure is well-controlled, managed by cardiologist  Lipids: Well controlled with atorvastatin 40 mg .  HDL is  usually low, triglycerides normal  Lab Results  Component Value Date   CHOL 106 12/28/2015   HDL 34.90 (L) 12/28/2015   LDLCALC 42 12/28/2015   TRIG 143.0 12/28/2015   CHOLHDL 3 12/28/2015    Foot exam done in 7/16, has callus formation on the left and significant sensory loss as well as decreased pulses    Examination:   BP (!) 112/58   Ht 6' (1.829 m)   Wt 165 lb (74.8 kg)   BMI 22.38 kg/m   Body mass index is 22.38  kg/m.    ASSESSMENT/ PLAN:   Diabetes type 2:  See history of present illness for detailed discussion of his current management, blood sugar patterns and problems identified  Blood glucose control is reasonably good for his age and duration of diabetes with A1c 7.4 However is discussed above we can improve various factors affecting his blood sugars including consistent timing of Glyset, resuming exercise, taking Cycloset before breakfast Also needs to do more readings after supper He is usually motivated to exercise when he is not traveling He is comfortable continuing Victoza as it appears to have helped and he is not too concerned about overall cost of his medications currently  Neuropathy: Discussed general foot care, need for diabetic shoes because of his neuropathy and vascular disease and he will be [facility].   We will see him back in 3 months with another A1c   Patient Instructions  Take GLYSET just before each meal even when eating out  Cycloset early am before meal  Check blood sugars on waking up  3x weekly  Also check blood sugars about 2 hours after a meal and do this after different meals by rotation  Recommended blood sugar levels on waking up is 90-130 and about 2 hours after meal is 130-160  Please bring your blood sugar monitor to each visit, thank you     [facility] 04/06/2016, 10:26 AM

## 2016-06-06 DIAGNOSIS — S46811A Strain of other muscles, fascia and tendons at shoulder and upper arm level, right arm, initial encounter: Secondary | ICD-10-CM | POA: Diagnosis not present

## 2016-06-07 ENCOUNTER — Other Ambulatory Visit: Payer: Self-pay | Admitting: Endocrinology

## 2016-06-11 DIAGNOSIS — S46811A Strain of other muscles, fascia and tendons at shoulder and upper arm level, right arm, initial encounter: Secondary | ICD-10-CM | POA: Diagnosis not present

## 2016-06-14 DIAGNOSIS — N401 Enlarged prostate with lower urinary tract symptoms: Secondary | ICD-10-CM | POA: Diagnosis not present

## 2016-06-14 DIAGNOSIS — R351 Nocturia: Secondary | ICD-10-CM | POA: Diagnosis not present

## 2016-06-20 DIAGNOSIS — S46011D Strain of muscle(s) and tendon(s) of the rotator cuff of right shoulder, subsequent encounter: Secondary | ICD-10-CM | POA: Diagnosis not present

## 2016-06-20 DIAGNOSIS — S46811D Strain of other muscles, fascia and tendons at shoulder and upper arm level, right arm, subsequent encounter: Secondary | ICD-10-CM | POA: Diagnosis not present

## 2016-06-21 DIAGNOSIS — C44519 Basal cell carcinoma of skin of other part of trunk: Secondary | ICD-10-CM | POA: Diagnosis not present

## 2016-07-04 ENCOUNTER — Other Ambulatory Visit (INDEPENDENT_AMBULATORY_CARE_PROVIDER_SITE_OTHER): Payer: PPO

## 2016-07-04 ENCOUNTER — Telehealth: Payer: Self-pay | Admitting: Interventional Cardiology

## 2016-07-04 DIAGNOSIS — E1165 Type 2 diabetes mellitus with hyperglycemia: Secondary | ICD-10-CM | POA: Diagnosis not present

## 2016-07-04 LAB — COMPREHENSIVE METABOLIC PANEL
ALT: 49 U/L (ref 0–53)
AST: 33 U/L (ref 0–37)
Albumin: 4 g/dL (ref 3.5–5.2)
Alkaline Phosphatase: 55 U/L (ref 39–117)
BUN: 25 mg/dL — ABNORMAL HIGH (ref 6–23)
CO2: 25 mEq/L (ref 19–32)
Calcium: 9.2 mg/dL (ref 8.4–10.5)
Chloride: 109 mEq/L (ref 96–112)
Creatinine, Ser: 1.12 mg/dL (ref 0.40–1.50)
GFR: 67.89 mL/min (ref >60.00)
Glucose, Bld: 138 mg/dL — ABNORMAL HIGH (ref 70–99)
Potassium: 4.7 mEq/L (ref 3.5–5.1)
Sodium: 140 mEq/L (ref 135–145)
Total Bilirubin: 0.8 mg/dL (ref 0.2–1.2)
Total Protein: 6.1 g/dL (ref 6.0–8.3)

## 2016-07-04 LAB — HEMOGLOBIN A1C: Hgb A1c MFr Bld: 7.5 % — ABNORMAL HIGH (ref 4.6–6.5)

## 2016-07-04 NOTE — Telephone Encounter (Signed)
Request for surgical clearance:  1. What type of surgery is being performed? Rt shoulder: SA-SAD-SCOPE with partial acromioplasty w/wo coracoacromial release, SA-DCR-SCOPE,  SA-RCR-SCOPE, SA-debridement limited, SA-lysis and resect adhesions w/wo manipulation  2. When is this surgery scheduled? 07/12/16   3. Are there any medications that need to be held prior to surgery and how long? ASA   4. Name of physician performing surgery?  Dr. Onnie Graham   5. What is your office phone and fax number? Fax [phone] ATTN Santiago Bur

## 2016-07-05 NOTE — Telephone Encounter (Signed)
Left message to call back  

## 2016-07-05 NOTE — Telephone Encounter (Addendum)
Left message to call back with wife.  She said pt should be home around 5:20P.

## 2016-07-05 NOTE — Telephone Encounter (Signed)
Had CABG 1.5 years ago. If no CV complaints, he is cleared for surgery. Okay to pause aspirin as needed for the procedure.

## 2016-07-06 ENCOUNTER — Ambulatory Visit (INDEPENDENT_AMBULATORY_CARE_PROVIDER_SITE_OTHER): Payer: PPO | Admitting: Endocrinology

## 2016-07-06 ENCOUNTER — Encounter: Payer: Self-pay | Admitting: Endocrinology

## 2016-07-06 VITALS — BP 110/58 | HR 62 | Ht 72.0 in | Wt 164.0 lb

## 2016-07-06 DIAGNOSIS — E1165 Type 2 diabetes mellitus with hyperglycemia: Secondary | ICD-10-CM | POA: Diagnosis not present

## 2016-07-06 MED ORDER — MIGLITOL 50 MG PO TABS: 50.0000 mg | ORAL_TABLET | Freq: Three times a day (TID) | ORAL | 1 refills | Status: DC

## 2016-07-06 NOTE — Telephone Encounter (Signed)
Spoke with pt and he denies any CV issues.  Will send this message to requesting office.

## 2016-07-06 NOTE — Patient Instructions (Signed)
Check blood sugars on waking up  2x per week  Also check blood sugars about 2 hours after a meal and do this after different meals by rotation  Recommended blood sugar levels on waking up is 90-130 and about 2 hours after meal is 130-160  Please bring your blood sugar monitor to each visit, thank you

## 2016-07-06 NOTE — Telephone Encounter (Signed)
New message    Pt is returning Jennifer's call.

## 2016-07-06 NOTE — Progress Notes (Signed)
Patient ID: Mario [name], male   DOB: 12-07-1940, 76 y.o.   MRN: [id number]   Reason for Appointment: Diabetes follow-up   History of Present Illness   Diagnosis: Type 2 DIABETES MELITUS, date of diagnosis:  1989     Previous history: He was initially diagnosed when hospitalized for pancreatitis and his previous endocrinologist had treated him with multiple medications because of progression of his diabetes. He was also put on Cycloset  which he has tolerated maximum dose He has had adjustments of his medication dosages the last couple of years and Januvia was restarted in 5/14 when blood sugars were higher. Dosages have been adjusted based on renal function Previously an A1c levels have been ranging from 6.6-7.5 His metformin was increased  when renal function was better and glipizide was changed to glipizide ER 10 mg. Did not appear to have better blood sugar control with Invokana  Recent history:   Non-insulin hypoglycemic drugs:  Glyset bid, glipizide ER 10 mg , Metformin 1 g twice a day,   Victoza 1.2 mg daily       He was started on Victoza for improved control when his A1c was 7.9% in 02/2015   His A1c is  fairly stable now at 7.5 previously was 7.4  Blood sugar patterns and problems:  FASTING blood sugars  have not been checked recently  He has done a few readings after lunch and supper and these are mildly high  However he does not think he always is eating lunch, some of her readings are after a late breakfast  He has only a rare blood sugar that is significantly high, the reading of 200 was from eating some sweets  He thinks his exercise level has been inadequate because of the right rotator cuff problem  His prescription is for only 2 tablets of Glyset and usually does not take it if he is having lunch   Not clear when he stopped taking  Cycloset in the mornings, he had been taking this for several years  Weight is about the same  Previously his  fasting readings had been averaging about 150  Side effects from medications: None. Proper timing of medications in relation to meals: usually  Monitors blood glucose: Once a day or less .    Glucometer: Freestyle    Blood Glucose readings from download  Mean values apply above for all meters except median for One Touch  PRE-MEAL Fasting Lunch Dinner Bedtime Overall  Glucose range: ?   157-174  101  152-200  101-200  Mean/median:     158    Meals: 2-3 meals per day, no lunch often (yogurt); breakfast is cereal, [place] bacon, sometimes bread and juice at breakfast, sweets In the evening occasionally     Dietician 3/16       Physical activity: exercise:  going to the gym 3/7, Using exercise bike     Wt Readings from Last 3 Encounters:  07/06/16 164 lb (74.4 kg)  04/06/16 165 lb (74.8 kg)  01/06/16 164 lb (74.4 kg)    Lab Results  Component Value Date   HGBA1C 7.5 (H) 07/04/2016   HGBA1C 7.4 (H) 04/05/2016   HGBA1C 7.6 (H) 12/28/2015   Lab Results  Component Value Date   MICROALBUR 8.7 (H) 04/06/2016   LDLCALC 42 12/28/2015   CREATININE 1.12 07/04/2016     LABS:  Lab on 07/04/2016  Component Date Value Ref Range Status  . Hgb A1c MFr Bld 07/04/2016 7.5*  4.6 - 6.5 % Final  . Sodium 07/04/2016 140  135 - 145 mEq/L Final  . Potassium 07/04/2016 4.7  3.5 - 5.1 mEq/L Final  . Chloride 07/04/2016 109  96 - 112 mEq/L Final  . CO2 07/04/2016 25  19 - 32 mEq/L Final  . Glucose, Bld 07/04/2016 138* 70 - 99 mg/dL Final  . BUN 07/04/2016 25* 6 - 23 mg/dL Final  . Creatinine, Ser 07/04/2016 1.12  0.40 - 1.50 mg/dL Final  . Total Bilirubin 07/04/2016 0.8  0.2 - 1.2 mg/dL Final  . Alkaline Phosphatase 07/04/2016 55  39 - 117 U/L Final  . AST 07/04/2016 33  0 - 37 U/L Final  . ALT 07/04/2016 49  0 - 53 U/L Final  . Total Protein 07/04/2016 6.1  6.0 - 8.3 g/dL Final  . Albumin 07/04/2016 4.0  3.5 - 5.2 g/dL Final  . Calcium 07/04/2016 9.2  8.4 - 10.5 mg/dL Final  . GFR  07/04/2016 67.89  >60.00 mL/min Final    Allergies as of 07/06/2016      Reactions   Penicillins Anaphylaxis   Plavix [clopidogrel Bisulfate] Hives   Pletal [cilostazol] Itching      Medication List       Accurate as of 07/06/16  3:04 PM. Always use your most recent med list.          acetaminophen 500 MG tablet Commonly known as:  TYLENOL Take 500 mg by mouth daily as needed.   allopurinol 100 MG tablet Commonly known as:  ZYLOPRIM Take 1 tablet (100 mg total) by mouth daily.   aspirin 325 MG EC tablet Take 1 tablet (325 mg total) by mouth daily.   atorvastatin 40 MG tablet Commonly known as:  LIPITOR TAKE 1 TABLET EVERY DAY AT  6 PM   Bromocriptine Mesylate 0.8 MG Tabs Take 4 mg by mouth daily.   carvedilol 3.125 MG tablet Commonly known as:  COREG TAKE 1 TABLET (3.125 MG TOTAL) BY MOUTH 2 (TWO) TIMES DAILY.   cholecalciferol 1000 units tablet Commonly known as:  VITAMIN D Take 2,000 Units by mouth daily.   Co-Enzyme Q-10 100 MG Caps Take 100 mg by mouth daily.   ferrous sulfate 325 (65 FE) MG tablet Take 325 mg by mouth daily.   finasteride 5 MG tablet Commonly known as:  PROSCAR Take 5 mg by mouth daily.   fluorouracil 5 % cream Commonly known as:  EFUDEX Apply 1 application topically daily as needed (skin irritations). Reported on 0/11/1217   folic acid 758 MCG tablet Commonly known as:  FOLVITE Take 800 mcg by mouth daily.   FREESTYLE FREEDOM LITE w/Device Kit Use as directed to check blood sugar 2 times per day dx code E11.65   freestyle lancets Use as instructed to check blood sugar 2 times per day dx code E11.65   glipiZIDE 10 MG 24 hr tablet Commonly known as:  GLUCOTROL XL Take 1 tablet (10 mg total) by mouth at bedtime.   glucose blood test strip Commonly known as:  FREESTYLE LITE Use as instructed to check blood sugar 2 times per day dx code E11.65   Insulin Pen Needle 31G X 8 MM Misc Use to inject insulin 1 time per day     ipratropium-albuterol 0.5-2.5 (3) MG/3ML Soln Commonly known as:  DUONEB Take 3 mLs by nebulization 2 (two) times daily.   lisinopril 5 MG tablet Commonly known as:  PRINIVIL,ZESTRIL TAKE 1 TABLET BY MOUTH EVERY DAY   metFORMIN  1000 MG tablet Commonly known as:  GLUCOPHAGE Take 1 tablet (1,000 mg total) by mouth 2 (two) times daily with a meal.   miglitol 50 MG tablet Commonly known as:  GLYSET Take 1 tablet (50 mg total) by mouth 3 (three) times daily with meals.   NITROSTAT 0.4 MG SL tablet Generic drug:  nitroGLYCERIN PLACE 1 TABLET (0.4 MG TOTAL) UNDER THE TONGUE EVERY 5 (FIVE) MINUTES AS NEEDED.   omega-3 acid ethyl esters 1 g capsule Commonly known as:  LOVAZA Take 1 g by mouth daily.   PRESERVISION AREDS 2 Caps Take 2 capsules by mouth daily. Reported on 09/18/2015   tamsulosin 0.4 MG Caps capsule Commonly known as:  FLOMAX Take 1 capsule (0.4 mg total) by mouth daily.   VICTOZA 18 MG/3ML Sopn Generic drug:  liraglutide Inject 0.2 mLs (1.2 mg total) into the skin daily. Inject once daily at the same time   vitamin B-12 500 MCG tablet Commonly known as:  CYANOCOBALAMIN Take 1,000 mcg by mouth daily.   vitamin E 100 UNIT capsule Take 100 Units by mouth daily.       Allergies:  Allergies  Allergen Reactions  . Penicillins Anaphylaxis  . Plavix [Clopidogrel Bisulfate] Hives  . Pletal [Cilostazol] Itching    Past Medical History:  Diagnosis Date  . Arthritis   . CAD (coronary artery disease)    a.  s/p IMI and BMS 1997;  b. Myoview 4/16:  inf scar with peri-infarct ischemia, EF 34%; high risk;  c. LHC 5/16:  3 v CAD >> CABG (free L-LAD, S-OM, S-dRCA)  . Carotid stenosis    a. carotid US 6/16:  bilat ICA 1-39%  . Chronic kidney disease, stage II (mild)   . Chronic systolic CHF (congestive heart failure) ([place])   . Essential hypertension, benign   . Gout   . HLD (hyperlipidemia)   . Ischemic cardiomyopathy    a. EF by myoview 4/16 34%; b. LHC 5/16: EF  40-45%;  c. intraop TEE 6/16: EF 45-50%  . Orthostasis   . PAD (peripheral artery disease) ([facility])    Dr. Fletcher Anon  . Pneumonia    hx of  . Type II or unspecified type diabetes mellitus without mention of complication, uncontrolled     Past Surgical History:  Procedure Laterality Date  . ABDOMINAL AORTAGRAM N/A 10/09/2013   Procedure: ABDOMINAL Maxcine Ham;  Surgeon: Wellington Hampshire, MD;  Location: [place];  Service: Cardiovascular;  Laterality: N/A;  . CARDIAC CATHETERIZATION N/A 10/02/2014   Procedure: Left Heart Cath and Coronary Angiography;  Surgeon: Belva Crome, MD;  Location: [place] [facility];  Service: Cardiovascular;  Laterality: N/A;  . CATARACT EXTRACTION    . CHOLECYSTECTOMY    . COLONOSCOPY    . CORONARY ARTERY BYPASS GRAFT N/A 11/03/2014   Procedure: CORONARY ARTERY BYPASS GRAFTING  times three using left internal mammary and right greater saphenous vein;  Surgeon: Gaye Pollack, MD;  Location: [place] [facility] OR;  Service: Open Heart Surgery;  Laterality: N/A;  . HERNIA REPAIR    . MELANOMA EXCISION    . SKIN LESION EXCISION     multiple  . TEE WITHOUT CARDIOVERSION N/A 11/03/2014   Procedure: TRANSESOPHAGEAL ECHOCARDIOGRAM (TEE);  Surgeon: Gaye Pollack, MD;  Location: [facility];  Service: Open Heart Surgery;  Laterality: N/A;    Family History  Problem Relation Age of Onset  . Colon cancer Father   . Heart disease Mother     Social History:  reports that he quit smoking about 20 months ago. His smoking use included Cigarettes. He has a 25.00 pack-year smoking history. He has never used smokeless tobacco. He reports that he does not drink alcohol or use drugs.  Review of Systems:   Hypertension:  currently taking lisinopril 5 mg and carvedilol low-dose and blood pressure is well-controlled, managed by cardiologist  Lipids: Well controlled with atorvastatin 40 mg .  HDL is  usually low, triglycerides normal   Lab Results  Component Value Date   CHOL 106 12/28/2015   HDL  34.90 (L) 12/28/2015   LDLCALC 42 12/28/2015   TRIG 143.0 12/28/2015   CHOLHDL 3 12/28/2015    Foot exam done in 7/16, has callus formation on the left and significant sensory loss as well as decreased pulses    Examination:   BP (!) 110/58   Pulse 62   Ht 6' (1.829 m)   Wt 164 lb (74.4 kg)   BMI 22.24 kg/m   Body mass index is 22.24 kg/m.    ASSESSMENT/ PLAN:   Diabetes type 2:  See history of present illness for detailed discussion of his current management, blood sugar patterns and problems identified  Blood glucose control is  stable and reasonably good for his age and duration of diabetes with A1c 7.5 He does not check his blood sugars very consistently Has mild increase in postprandial readings but not consistently and these are generally within range Previously has had variable fasting readings averaging about 150 Unlikely that his blood sugars are increasing with leaving off Cycloset at some point of time and he finds this expensive  For now he will continue the same regimen He says he will start exercising more at his apartment gym Discussed blood sugar targets Changed Glyset prescription and he can get this 3 times a day to have at lunch if he is eating this  We will see him back in 3 months with another A1c   Patient Instructions  Check blood sugars on waking up  2x per week  Also check blood sugars about 2 hours after a meal and do this after different meals by rotation  Recommended blood sugar levels on waking up is 90-130 and about 2 hours after meal is 130-160  Please bring your blood sugar monitor to each visit, thank you     [facility] 07/06/2016, 3:04 PM

## 2016-07-12 DIAGNOSIS — M75121 Complete rotator cuff tear or rupture of right shoulder, not specified as traumatic: Secondary | ICD-10-CM | POA: Diagnosis not present

## 2016-07-12 DIAGNOSIS — G8918 Other acute postprocedural pain: Secondary | ICD-10-CM | POA: Diagnosis not present

## 2016-07-12 DIAGNOSIS — M24111 Other articular cartilage disorders, right shoulder: Secondary | ICD-10-CM | POA: Diagnosis not present

## 2016-07-12 DIAGNOSIS — M7521 Bicipital tendinitis, right shoulder: Secondary | ICD-10-CM | POA: Diagnosis not present

## 2016-07-12 DIAGNOSIS — E119 Type 2 diabetes mellitus without complications: Secondary | ICD-10-CM | POA: Diagnosis not present

## 2016-07-12 DIAGNOSIS — S46111A Strain of muscle, fascia and tendon of long head of biceps, right arm, initial encounter: Secondary | ICD-10-CM | POA: Diagnosis not present

## 2016-07-12 DIAGNOSIS — S46011A Strain of muscle(s) and tendon(s) of the rotator cuff of right shoulder, initial encounter: Secondary | ICD-10-CM | POA: Diagnosis not present

## 2016-07-12 DIAGNOSIS — I519 Heart disease, unspecified: Secondary | ICD-10-CM | POA: Diagnosis not present

## 2016-07-12 DIAGNOSIS — M19011 Primary osteoarthritis, right shoulder: Secondary | ICD-10-CM | POA: Diagnosis not present

## 2016-07-12 DIAGNOSIS — M659 Synovitis and tenosynovitis, unspecified: Secondary | ICD-10-CM | POA: Diagnosis not present

## 2016-07-12 DIAGNOSIS — S46911D Strain of unspecified muscle, fascia and tendon at shoulder and upper arm level, right arm, subsequent encounter: Secondary | ICD-10-CM | POA: Diagnosis not present

## 2016-07-12 DIAGNOSIS — C4490 Unspecified malignant neoplasm of skin, unspecified: Secondary | ICD-10-CM | POA: Diagnosis not present

## 2016-07-12 DIAGNOSIS — M7541 Impingement syndrome of right shoulder: Secondary | ICD-10-CM | POA: Diagnosis not present

## 2016-07-12 DIAGNOSIS — M94211 Chondromalacia, right shoulder: Secondary | ICD-10-CM | POA: Diagnosis not present

## 2016-07-22 NOTE — Progress Notes (Signed)
Please  schedule follow-up in 5/ 18 with labs

## 2016-08-05 ENCOUNTER — Other Ambulatory Visit: Payer: Self-pay | Admitting: Endocrinology

## 2016-08-20 ENCOUNTER — Other Ambulatory Visit: Payer: Self-pay | Admitting: Interventional Cardiology

## 2016-08-20 DIAGNOSIS — I10 Essential (primary) hypertension: Secondary | ICD-10-CM

## 2016-08-30 ENCOUNTER — Telehealth: Payer: Self-pay | Admitting: Endocrinology

## 2016-08-30 ENCOUNTER — Other Ambulatory Visit: Payer: Self-pay

## 2016-08-30 MED ORDER — GLIPIZIDE ER 10 MG PO TB24: 10.0000 mg | ORAL_TABLET | Freq: Every day | ORAL | 1 refills | Status: DC

## 2016-08-30 MED ORDER — ALLOPURINOL 100 MG PO TABS: 100.0000 mg | ORAL_TABLET | Freq: Every day | ORAL | 1 refills | Status: DC

## 2016-08-30 NOTE — Telephone Encounter (Signed)
Ordered

## 2016-08-30 NOTE — Telephone Encounter (Signed)
Pt needs refills called [facility] please call in allopurinol and glipizide

## 2016-08-31 ENCOUNTER — Encounter: Payer: Self-pay | Admitting: Physical Therapy

## 2016-08-31 ENCOUNTER — Ambulatory Visit: Payer: PPO | Attending: Orthopedic Surgery | Admitting: Physical Therapy

## 2016-08-31 DIAGNOSIS — M25611 Stiffness of right shoulder, not elsewhere classified: Secondary | ICD-10-CM | POA: Insufficient documentation

## 2016-08-31 DIAGNOSIS — R2231 Localized swelling, mass and lump, right upper limb: Secondary | ICD-10-CM | POA: Insufficient documentation

## 2016-08-31 DIAGNOSIS — M25511 Pain in right shoulder: Secondary | ICD-10-CM | POA: Insufficient documentation

## 2016-08-31 NOTE — Therapy (Signed)
[place], [place], [address] Phone: [phone]   Fax:  [phone]  Physical Therapy Evaluation  Patient Details  Name: Mario [name] MRN: [id number] Date of Birth: [birth year] Referring Provider: Onnie Graham  Encounter Date: 08/31/2016      PT End of Session - 08/31/16 1133    Visit Number 1   Date for PT Re-Evaluation 10/31/16   PT Start Time 1045   PT Stop Time 1140   PT Time Calculation (min) 55 min   Activity Tolerance Patient tolerated treatment well   Behavior During Therapy [facility] for tasks assessed/performed      Past Medical History:  Diagnosis Date  . Arthritis   . CAD (coronary artery disease)    a.  s/p IMI and BMS 1997;  b. Myoview 4/16:  inf scar with peri-infarct ischemia, EF 34%; high risk;  c. LHC 5/16:  3 v CAD >> CABG (free L-LAD, S-OM, S-dRCA)  . Carotid stenosis    a. carotid US 6/16:  bilat ICA 1-39%  . Chronic kidney disease, stage II (mild)   . Chronic systolic CHF (congestive heart failure) ([place])   . Essential hypertension, benign   . Gout   . HLD (hyperlipidemia)   . Ischemic cardiomyopathy    a. EF by myoview 4/16 34%; b. LHC 5/16: EF 40-45%;  c. intraop TEE 6/16: EF 45-50%  . Orthostasis   . PAD (peripheral artery disease) (HCC)    Dr. Fletcher Anon  . Pneumonia    hx of  . Type II or unspecified type diabetes mellitus without mention of complication, uncontrolled     Past Surgical History:  Procedure Laterality Date  . ABDOMINAL AORTAGRAM N/A 10/09/2013   Procedure: ABDOMINAL Maxcine Ham;  Surgeon: Wellington Hampshire, MD;  Location: [place] CATH LAB;  Service: Cardiovascular;  Laterality: N/A;  . CARDIAC CATHETERIZATION N/A 10/02/2014   Procedure: Left Heart Cath and Coronary Angiography;  Surgeon: Belva Crome, MD;  Location: [place] CV LAB;  Service: Cardiovascular;  Laterality: N/A;  . CATARACT EXTRACTION    . CHOLECYSTECTOMY    . COLONOSCOPY    . CORONARY ARTERY  BYPASS GRAFT N/A 11/03/2014   Procedure: CORONARY ARTERY BYPASS GRAFTING  times three using left internal mammary and right greater saphenous vein;  Surgeon: Gaye Pollack, MD;  Location: [place] OR;  Service: Open Heart Surgery;  Laterality: N/A;  . HERNIA REPAIR    . MELANOMA EXCISION    . SKIN LESION EXCISION     multiple  . TEE WITHOUT CARDIOVERSION N/A 11/03/2014   Procedure: TRANSESOPHAGEAL ECHOCARDIOGRAM (TEE);  Surgeon: Gaye Pollack, MD;  Location: [place];  Service: Open Heart Surgery;  Laterality: N/A;    There were no vitals filed for this visit.       Subjective Assessment - 08/31/16 1049    Subjective Patient reports that over time playing tennis and pickelball he was having right shoulder soreness.  He reports a fall early December landing on the right shoulder.  He later had an MRI that showed a large RC tear.  He underwent the right RC repair on 07/12/16.  He was placed in a sling for 6 weeks.  Come to PT today to start the rehabilitation process after [facility] repair.   Limitations Lifting;House hold activities   Patient Stated Goals have normal ROM and no difficulty with ADL's.     Currently in Pain? Yes   Pain Score 2  Pain Location Shoulder   Pain Orientation Right   Pain Descriptors / Indicators Aching;Sore;Tightness   Pain Type Surgical pain;Acute pain   Pain Onset More than a month ago   Pain Frequency Constant   Aggravating Factors  worse with reaching out and behind pain up to 5/10   Pain Relieving Factors rest and ice helps, pain can be a 1/10   Effect of Pain on Daily Activities limits all ADL's            [facility] PT Assessment - 08/31/16 0001      Assessment   Medical Diagnosis s/p right RC repair   Referring Provider Supple   Onset Date/Surgical Date 07/12/16   Hand Dominance Right   Prior Therapy none     Precautions   Precaution Comments follow RC repair protocol.  This is week 7     Balance Screen   Has the patient fallen in the past 6 months Yes    How many times? 1   Has the patient had a decrease in activity level because of a fear of falling?  No   Is the patient reluctant to leave their home because of a fear of falling?  No     Home Environment   Additional Comments does some light housework     Prior Function   Level of Independence Independent   Vocation Retired   Leisure would like to return to some yoga and gym and possibly pickleball if possible     Posture/Postural Control   Posture Comments rounded shoulders and fwd head, shoulders elevated and gaurded     ROM / Strength   AROM / PROM / Strength AROM;PROM;Strength     AROM   Overall AROM Comments standing, sore at end ranges   AROM Assessment Site Shoulder   Right/Left Shoulder Right   Right Shoulder Flexion 106 Degrees   Right Shoulder ABduction 100 Degrees   Right Shoulder Internal Rotation 30 Degrees   Right Shoulder External Rotation 60 Degrees     PROM   PROM Assessment Site Shoulder   Right/Left Shoulder Right   Right Shoulder Flexion 120 Degrees   Right Shoulder ABduction 110 Degrees   Right Shoulder Internal Rotation 35 Degrees   Right Shoulder External Rotation 80 Degrees     Palpation   Palpation comment spasms and tightness in the upper traps and in the neck                    [facility] Adult PT Treatment/Exercise - 08/31/16 0001      Exercises   Exercises Shoulder     Shoulder Exercises: Standing   Other Standing Exercises wand exercises all motions at the shoulder     Shoulder Exercises: ROM/Strengthening   UBE (Upper Arm Bike) level 2 x 5 minutes     Shoulder Exercises: Stretch   Wall Stretch - Flexion 5 reps;10 seconds                PT Education - 08/31/16 1132    Education provided Yes   Education Details started AAROM for the shoulder   Person(s) Educated Patient   Methods Explanation;Demonstration;Tactile cues;Verbal cues;Handout   Comprehension Verbalized understanding;Returned demonstration;Verbal cues  required          PT Short Term Goals - 08/31/16 1146      PT SHORT TERM GOAL #1   Title independent with initial HEP   Time 2   Period Weeks   Status New  PT Long Term Goals - 08/31/16 1147      PT LONG TERM GOAL #1   Title independent and safe with return to gym   Time 8   Period Weeks   Status New     PT LONG TERM GOAL #2   Title decrease pain 50%   Time 8   Period Weeks   Status New     PT LONG TERM GOAL #3   Title dress and do ADL's without pain   Time 8   Period Weeks   Status New     PT LONG TERM GOAL #4   Title increase right shoulder flexion to 150 degrees   Time 8   Period Weeks   Status New     PT LONG TERM GOAL #5   Title increase AROM of right shoulder IR to 65 degrees   Time 8   Period Weeks   Status New               Plan - 08/31/16 1133    Clinical Impression Statement Patient reports a fall in early December that caused a large RC tear on the right.  He underwent a right RC repair on 07/12/16.  Patient has been in the sling for 6 weeks, he is at week 7 now.  His AAROM/PROM was very good.  Having some pain in the right upper trap    Rehab Potential Good   PT Frequency 2x / week   PT Duration 8 weeks   PT Treatment/Interventions ADLs/Self Care Home Management;Cryotherapy;Electrical Stimulation;Moist Heat;Therapeutic activities;Therapeutic exercise;Neuromuscular re-education;Patient/family education;Manual techniques;Vasopneumatic Device   PT Next Visit Plan He is to do the [facility] for the next week.  Next week he will be at week 8 and we can progress to some light PRE's   Consulted and Agree with Plan of Care Patient      Patient will benefit from skilled therapeutic intervention in order to improve the following deficits and impairments:  Decreased activity tolerance, Decreased strength, Increased edema, Decreased range of motion, Increased muscle spasms, Impaired flexibility, Improper body mechanics, Postural dysfunction,  Pain  Visit Diagnosis: Acute pain of right shoulder - Plan: PT plan of care cert/re-cert  Stiffness of right shoulder, not elsewhere classified - Plan: PT plan of care cert/re-cert  Localized swelling, mass and lump, right upper limb - Plan: PT plan of care cert/re-cert      G-Codes - 00/86/76 1148    Functional Assessment Tool Used (Outpatient Only) foto 59% limitation   Functional Limitation Carrying, moving and handling objects   Carrying, Moving and Handling Objects Current Status (P9509) At least 40 percent but less than 60 percent impaired, limited or restricted   Carrying, Moving and Handling Objects Goal Status (T2671) At least 20 percent but less than 40 percent impaired, limited or restricted       Problem List Patient Active Problem List   Diagnosis Date Noted  . Abnormal myocardial perfusion study 10/02/2014  . Tobacco use 04/08/2014  . PAD (peripheral artery disease) ([place])   . Orthostasis   . CAD (coronary artery disease) of artery bypass graft   . Gout   . Essential hypertension, benign 06/13/2013  . 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 Type II or unspecified type diabetes mellitus without mention of complication, uncontrolled 01/25/2013  . Hyperlipidemia 01/25/2013    Sumner Boast., PT 08/31/2016, 11:55 AM  [place],  [place], [address] Phone: [phone]   Fax:  [phone]  Name: Henery Betzold MRN: [id number] Date of Birth: 1940/08/28

## 2016-09-01 DIAGNOSIS — E1122 Type 2 diabetes mellitus with diabetic chronic kidney disease: Secondary | ICD-10-CM | POA: Diagnosis not present

## 2016-09-01 DIAGNOSIS — N183 Chronic kidney disease, stage 3 (moderate): Secondary | ICD-10-CM | POA: Diagnosis not present

## 2016-09-01 DIAGNOSIS — N401 Enlarged prostate with lower urinary tract symptoms: Secondary | ICD-10-CM | POA: Diagnosis not present

## 2016-09-01 DIAGNOSIS — E133599 Other specified diabetes mellitus with proliferative diabetic retinopathy without macular edema, unspecified eye: Secondary | ICD-10-CM | POA: Diagnosis not present

## 2016-09-01 DIAGNOSIS — Z1389 Encounter for screening for other disorder: Secondary | ICD-10-CM | POA: Diagnosis not present

## 2016-09-01 DIAGNOSIS — Z0000 Encounter for general adult medical examination without abnormal findings: Secondary | ICD-10-CM | POA: Diagnosis not present

## 2016-09-01 DIAGNOSIS — E7800 Pure hypercholesterolemia, unspecified: Secondary | ICD-10-CM | POA: Diagnosis not present

## 2016-09-01 DIAGNOSIS — I10 Essential (primary) hypertension: Secondary | ICD-10-CM | POA: Diagnosis not present

## 2016-09-01 DIAGNOSIS — I779 Disorder of arteries and arterioles, unspecified: Secondary | ICD-10-CM | POA: Diagnosis not present

## 2016-09-01 DIAGNOSIS — I2510 Atherosclerotic heart disease of native coronary artery without angina pectoris: Secondary | ICD-10-CM | POA: Diagnosis not present

## 2016-09-01 DIAGNOSIS — E11319 Type 2 diabetes mellitus with unspecified diabetic retinopathy without macular edema: Secondary | ICD-10-CM | POA: Diagnosis not present

## 2016-09-01 DIAGNOSIS — Z7984 Long term (current) use of oral hypoglycemic drugs: Secondary | ICD-10-CM | POA: Diagnosis not present

## 2016-09-07 ENCOUNTER — Ambulatory Visit: Payer: PPO | Admitting: Physical Therapy

## 2016-09-07 ENCOUNTER — Encounter: Payer: Self-pay | Admitting: Physical Therapy

## 2016-09-07 DIAGNOSIS — M25511 Pain in right shoulder: Secondary | ICD-10-CM | POA: Diagnosis not present

## 2016-09-07 DIAGNOSIS — R2231 Localized swelling, mass and lump, right upper limb: Secondary | ICD-10-CM

## 2016-09-07 DIAGNOSIS — M25611 Stiffness of right shoulder, not elsewhere classified: Secondary | ICD-10-CM

## 2016-09-07 NOTE — Therapy (Signed)
[place], [place], [address] Phone: [phone]   Fax:  [phone]  Physical Therapy Treatment  Patient Details  Name: Mario [name] MRN: [id number] Date of Birth: 10-13-1940 Referring Provider: Onnie Graham  Encounter Date: 09/07/2016      PT End of Session - 09/07/16 1133    Visit Number 2   Date for PT Re-Evaluation 10/31/16   PT Start Time 1059   PT Stop Time 1150   PT Time Calculation (min) 51 min   Activity Tolerance Patient tolerated treatment well   Behavior During Therapy [facility] for tasks assessed/performed      Past Medical History:  Diagnosis Date  . Arthritis   . CAD (coronary artery disease)    a.  s/p IMI and BMS 1997;  b. Myoview 4/16:  inf scar with peri-infarct ischemia, EF 34%; high risk;  c. LHC 5/16:  3 v CAD >> CABG (free L-LAD, S-OM, S-dRCA)  . Carotid stenosis    a. carotid US 6/16:  bilat ICA 1-39%  . Chronic kidney disease, stage II (mild)   . Chronic systolic CHF (congestive heart failure) ([place])   . Essential hypertension, benign   . Gout   . HLD (hyperlipidemia)   . Ischemic cardiomyopathy    a. EF by myoview 4/16 34%; b. LHC 5/16: EF 40-45%;  c. intraop TEE 6/16: EF 45-50%  . Orthostasis   . PAD (peripheral artery disease) (HCC)    Dr. Fletcher Anon  . Pneumonia    hx of  . Type II or unspecified type diabetes mellitus without mention of complication, uncontrolled     Past Surgical History:  Procedure Laterality Date  . ABDOMINAL AORTAGRAM N/A 10/09/2013   Procedure: ABDOMINAL Maxcine Ham;  Surgeon: Wellington Hampshire, MD;  Location: [place] CATH LAB;  Service: Cardiovascular;  Laterality: N/A;  . CARDIAC CATHETERIZATION N/A 10/02/2014   Procedure: Left Heart Cath and Coronary Angiography;  Surgeon: Belva Crome, MD;  Location: [place] CV LAB;  Service: Cardiovascular;  Laterality: N/A;  . CATARACT EXTRACTION    . CHOLECYSTECTOMY    . COLONOSCOPY    . CORONARY ARTERY  BYPASS GRAFT N/A 11/03/2014   Procedure: CORONARY ARTERY BYPASS GRAFTING  times three using left internal mammary and right greater saphenous vein;  Surgeon: Gaye Pollack, MD;  Location: [facility] OR;  Service: Open Heart Surgery;  Laterality: N/A;  . HERNIA REPAIR    . MELANOMA EXCISION    . SKIN LESION EXCISION     multiple  . TEE WITHOUT CARDIOVERSION N/A 11/03/2014   Procedure: TRANSESOPHAGEAL ECHOCARDIOGRAM (TEE);  Surgeon: Gaye Pollack, MD;  Location: [facility];  Service: Open Heart Surgery;  Laterality: N/A;    There were no vitals filed for this visit.      Subjective Assessment - 09/07/16 1101    Subjective Patient reports that his pain is very mild and he only has some soreness during exercises. He stated that he is looking forward to getting back to pickleball.    Currently in Pain? Yes   Pain Score 2    Pain Location Shoulder   Pain Orientation Right   Pain Descriptors / Indicators Sore                         [facility] Adult PT Treatment/Exercise - 09/07/16 0001      Shoulder Exercises: Standing   Extension Both;10 reps;Theraband   Theraband Level (Shoulder  Extension) Level 2 (Red)   Retraction Both;10 reps;Theraband   Theraband Level (Shoulder Retraction) Level 2 (Red)   Other Standing Exercises wand exercises all motions at the shoulder, wall slides & circles 1 x 10   Other Standing Exercises Er/IR with yellow tband 2x 10     Shoulder Exercises: Therapy Ball   Flexion 10 reps   Other Therapy Ball Exercises circles 1 x 10      Shoulder Exercises: ROM/Strengthening   UBE (Upper Arm Bike) L 2 3 min forward 3 min backwards     Modalities   Modalities Vasopneumatic     Vasopneumatic   Number Minutes Vasopneumatic  15 minutes   Vasopnuematic Location  Shoulder   Vasopneumatic Pressure Medium   Vasopneumatic Temperature  32                  PT Short Term Goals - 08/31/16 1146      PT SHORT TERM GOAL #1   Title independent with initial HEP    Time 2   Period Weeks   Status New           PT Long Term Goals - 08/31/16 1147      PT LONG TERM GOAL #1   Title independent and safe with return to gym   Time 8   Period Weeks   Status New     PT LONG TERM GOAL #2   Title decrease pain 50%   Time 8   Period Weeks   Status New     PT LONG TERM GOAL #3   Title dress and do ADL's without pain   Time 8   Period Weeks   Status New     PT LONG TERM GOAL #4   Title increase right shoulder flexion to 150 degrees   Time 8   Period Weeks   Status New     PT LONG TERM GOAL #5   Title increase AROM of right shoulder IR to 65 degrees   Time 8   Period Weeks   Status New               Plan - 09/07/16 1135    Clinical Impression Statement Patient handled all exercises very well. He needed cues to help with the direction of the motions.The patient also required some stabilization to prevent weight shifting to perform exercises. Abduction with the wand caused some tightness and soreness as well as the wall slide circles. The patient also stated that he has been doing the UBE at the [facility].   PT Next Visit Plan Continue with AAROM and light exercises.       Patient will benefit from skilled therapeutic intervention in order to improve the following deficits and impairments:  Decreased activity tolerance, Decreased strength, Increased edema, Decreased range of motion, Increased muscle spasms, Impaired flexibility, Improper body mechanics, Postural dysfunction, Pain  Visit Diagnosis: Acute pain of right shoulder  Stiffness of right shoulder, not elsewhere classified  Localized swelling, mass and lump, right upper limb     Problem List Patient Active Problem List   Diagnosis Date Noted  . Abnormal myocardial perfusion study 10/02/2014  . Tobacco use 04/08/2014  . PAD (peripheral artery disease) ([place])   . Orthostasis   . CAD (coronary artery disease) of artery bypass graft   . Gout   . Essential hypertension,  benign 06/13/2013  . 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 Type II or unspecified type diabetes mellitus  without mention of complication, uncontrolled 01/25/2013  . Hyperlipidemia 01/25/2013    Alan Mulder SPTA 09/07/2016, 11:42 AM  [place] [address] [place], [place], [address] Phone: [phone]   Fax:  [phone]  Name: Mario [name] MRN: [id number] Date of Birth: Sep 18, 1940

## 2016-09-13 ENCOUNTER — Ambulatory Visit: Payer: PPO | Admitting: Physical Therapy

## 2016-09-13 ENCOUNTER — Encounter: Payer: Self-pay | Admitting: Physical Therapy

## 2016-09-13 DIAGNOSIS — M25511 Pain in right shoulder: Secondary | ICD-10-CM

## 2016-09-13 DIAGNOSIS — R2231 Localized swelling, mass and lump, right upper limb: Secondary | ICD-10-CM

## 2016-09-13 DIAGNOSIS — M25611 Stiffness of right shoulder, not elsewhere classified: Secondary | ICD-10-CM

## 2016-09-13 NOTE — Therapy (Signed)
[place], [place], [address] Phone: [phone]   Fax:  [phone]  Physical Therapy Treatment  Patient Details  Name: Mario [name] MRN: [id number] Date of Birth: Nov 30, 1940 Referring Provider: Onnie Graham  Encounter Date: 09/13/2016      PT End of Session - 09/13/16 1307    Visit Number 3   Date for PT Re-Evaluation 10/31/16   PT Start Time 3267   PT Stop Time 1318   PT Time Calculation (min) 47 min   Activity Tolerance Patient tolerated treatment well   Behavior During Therapy [facility] for tasks assessed/performed      Past Medical History:  Diagnosis Date  . Arthritis   . CAD (coronary artery disease)    a.  s/p IMI and BMS 1997;  b. Myoview 4/16:  inf scar with peri-infarct ischemia, EF 34%; high risk;  c. LHC 5/16:  3 v CAD >> CABG (free L-LAD, S-OM, S-dRCA)  . Carotid stenosis    a. carotid US 6/16:  bilat ICA 1-39%  . Chronic kidney disease, stage II (mild)   . Chronic systolic CHF (congestive heart failure) ([place])   . Essential hypertension, benign   . Gout   . HLD (hyperlipidemia)   . Ischemic cardiomyopathy    a. EF by myoview 4/16 34%; b. LHC 5/16: EF 40-45%;  c. intraop TEE 6/16: EF 45-50%  . Orthostasis   . PAD (peripheral artery disease) (HCC)    Dr. Fletcher Anon  . Pneumonia    hx of  . Type II or unspecified type diabetes mellitus without mention of complication, uncontrolled     Past Surgical History:  Procedure Laterality Date  . ABDOMINAL AORTAGRAM N/A 10/09/2013   Procedure: ABDOMINAL Maxcine Ham;  Surgeon: Wellington Hampshire, MD;  Location: [place] CATH LAB;  Service: Cardiovascular;  Laterality: N/A;  . CARDIAC CATHETERIZATION N/A 10/02/2014   Procedure: Left Heart Cath and Coronary Angiography;  Surgeon: Belva Crome, MD;  Location: [place] CV LAB;  Service: Cardiovascular;  Laterality: N/A;  . CATARACT EXTRACTION    . CHOLECYSTECTOMY    . COLONOSCOPY    . CORONARY ARTERY  BYPASS GRAFT N/A 11/03/2014   Procedure: CORONARY ARTERY BYPASS GRAFTING  times three using left internal mammary and right greater saphenous vein;  Surgeon: Gaye Pollack, MD;  Location: [place] OR;  Service: Open Heart Surgery;  Laterality: N/A;  . HERNIA REPAIR    . MELANOMA EXCISION    . SKIN LESION EXCISION     multiple  . TEE WITHOUT CARDIOVERSION N/A 11/03/2014   Procedure: TRANSESOPHAGEAL ECHOCARDIOGRAM (TEE);  Surgeon: Gaye Pollack, MD;  Location: [place];  Service: Open Heart Surgery;  Laterality: N/A;    There were no vitals filed for this visit.      Subjective Assessment - 09/13/16 1235    Subjective Patient reports that he felt good after his last session and has been to the [facility] a couple of times and did the UBE. He reports that he has no pain and just a little stiffness.    Currently in Pain? No/denies                         [facility] Adult PT Treatment/Exercise - 09/13/16 0001      Shoulder Exercises: Supine   Other Supine Exercises 2# punches & circles 2 x 10      Shoulder Exercises: Seated   Other Seated Exercises --  Shoulder Exercises: Standing   Extension Both;10 reps;Theraband   Theraband Level (Shoulder Extension) Level 2 (Red)   Retraction Both;10 reps;Theraband   Theraband Level (Shoulder Retraction) Level 2 (Red)   Other Standing Exercises wand exercises all motions at the shoulder, wall slides & circles 1 x 10   Other Standing Exercises Er/IR with yellow tband 2x 10     Shoulder Exercises: ROM/Strengthening   UBE (Upper Arm Bike) L 2 3 min forward 3 min backwards   Other ROM/Strengthening Exercises wall circles 3 x 5 each way     Shoulder Exercises: Isometric Strengthening   External Rotation 5X5"   Internal Rotation 5X5"     Modalities   Modalities Vasopneumatic     Vasopneumatic   Number Minutes Vasopneumatic  15 minutes   Vasopnuematic Location  Shoulder   Vasopneumatic Pressure Medium   Vasopneumatic Temperature  32                   PT Short Term Goals - 08/31/16 1146      PT SHORT TERM GOAL #1   Title independent with initial HEP   Time 2   Period Weeks   Status New           PT Long Term Goals - 08/31/16 1147      PT LONG TERM GOAL #1   Title independent and safe with return to gym   Time 8   Period Weeks   Status New     PT LONG TERM GOAL #2   Title decrease pain 50%   Time 8   Period Weeks   Status New     PT LONG TERM GOAL #3   Title dress and do ADL's without pain   Time 8   Period Weeks   Status New     PT LONG TERM GOAL #4   Title increase right shoulder flexion to 150 degrees   Time 8   Period Weeks   Status New     PT LONG TERM GOAL #5   Title increase AROM of right shoulder IR to 65 degrees   Time 8   Period Weeks   Status New               Plan - 09/13/16 1308    Clinical Impression Statement Patient handled all the incoperation of new exercises well. He needed cues to prevent weight shifting during exercises. The wall circles at the end caused some soreness and fatigue. The patient required cues to stand straight up during standing exercises to prevent lumbar extension.    PT Next Visit Plan Continue with AAROM and light exercises.       Patient will benefit from skilled therapeutic intervention in order to improve the following deficits and impairments:  Decreased activity tolerance, Decreased strength, Increased edema, Decreased range of motion, Increased muscle spasms, Impaired flexibility, Improper body mechanics, Postural dysfunction, Pain  Visit Diagnosis: Acute pain of right shoulder  Stiffness of right shoulder, not elsewhere classified  Localized swelling, mass and lump, right upper limb     Problem List Patient Active Problem List   Diagnosis Date Noted  . Abnormal myocardial perfusion study 10/02/2014  . Tobacco use 04/08/2014  . PAD (peripheral artery disease) ([place])   . Orthostasis   . CAD (coronary artery disease)  of artery bypass graft   . Gout   . Essential hypertension, benign 06/13/2013  . 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 Type II  or unspecified type diabetes mellitus without mention of complication, uncontrolled 01/25/2013  . Hyperlipidemia 01/25/2013    Alan Mulder SPTA 09/13/2016, 1:18 PM  [place] [address] [place], [place], [address] Phone: [phone]   Fax:  [phone]  Name: Alter Moss MRN: [id number] Date of Birth: 1940-12-15

## 2016-09-21 ENCOUNTER — Other Ambulatory Visit: Payer: Self-pay | Admitting: Interventional Cardiology

## 2016-09-21 DIAGNOSIS — I10 Essential (primary) hypertension: Secondary | ICD-10-CM

## 2016-09-22 ENCOUNTER — Ambulatory Visit (INDEPENDENT_AMBULATORY_CARE_PROVIDER_SITE_OTHER): Payer: PPO | Admitting: Ophthalmology

## 2016-09-28 ENCOUNTER — Ambulatory Visit: Payer: PPO | Attending: Orthopedic Surgery | Admitting: Physical Therapy

## 2016-09-28 ENCOUNTER — Encounter: Payer: Self-pay | Admitting: Physical Therapy

## 2016-09-28 DIAGNOSIS — M25611 Stiffness of right shoulder, not elsewhere classified: Secondary | ICD-10-CM

## 2016-09-28 DIAGNOSIS — R2231 Localized swelling, mass and lump, right upper limb: Secondary | ICD-10-CM

## 2016-09-28 DIAGNOSIS — M25511 Pain in right shoulder: Secondary | ICD-10-CM | POA: Diagnosis present

## 2016-09-28 NOTE — Therapy (Signed)
[place], [place], [address] Phone: [phone]   Fax:  [phone]  Physical Therapy Treatment  Patient Details  Name: Mario [name] MRN: [id number] Date of Birth: 02-03-1941 Referring Provider: Onnie Graham  Encounter Date: 09/28/2016      PT End of Session - 09/28/16 1138    Visit Number 4   Date for PT Re-Evaluation 10/31/16   PT Start Time 1055   PT Stop Time 1151   PT Time Calculation (min) 56 min   Activity Tolerance Patient tolerated treatment well   Behavior During Therapy [facility] for tasks assessed/performed      Past Medical History:  Diagnosis Date  . Arthritis   . CAD (coronary artery disease)    a.  s/p IMI and BMS 1997;  b. Myoview 4/16:  inf scar with peri-infarct ischemia, EF 34%; high risk;  c. LHC 5/16:  3 v CAD >> CABG (free L-LAD, S-OM, S-dRCA)  . Carotid stenosis    a. carotid US 6/16:  bilat ICA 1-39%  . Chronic kidney disease, stage II (mild)   . Chronic systolic CHF (congestive heart failure) ([place])   . Essential hypertension, benign   . Gout   . HLD (hyperlipidemia)   . Ischemic cardiomyopathy    a. EF by myoview 4/16 34%; b. LHC 5/16: EF 40-45%;  c. intraop TEE 6/16: EF 45-50%  . Orthostasis   . PAD (peripheral artery disease) ([facility])    Dr. Fletcher Anon  . Pneumonia    hx of  . Type II or unspecified type diabetes mellitus without mention of complication, uncontrolled     Past Surgical History:  Procedure Laterality Date  . ABDOMINAL AORTAGRAM N/A 10/09/2013   Procedure: ABDOMINAL Maxcine Ham;  Surgeon: Wellington Hampshire, MD;  Location: [place] CATH LAB;  Service: Cardiovascular;  Laterality: N/A;  . CARDIAC CATHETERIZATION N/A 10/02/2014   Procedure: Left Heart Cath and Coronary Angiography;  Surgeon: Belva Crome, MD;  Location: [place] CV LAB;  Service: Cardiovascular;  Laterality: N/A;  . CATARACT EXTRACTION    . CHOLECYSTECTOMY    . COLONOSCOPY    . CORONARY ARTERY  BYPASS GRAFT N/A 11/03/2014   Procedure: CORONARY ARTERY BYPASS GRAFTING  times three using left internal mammary and right greater saphenous vein;  Surgeon: Gaye Pollack, MD;  Location: [place] OR;  Service: Open Heart Surgery;  Laterality: N/A;  . HERNIA REPAIR    . MELANOMA EXCISION    . SKIN LESION EXCISION     multiple  . TEE WITHOUT CARDIOVERSION N/A 11/03/2014   Procedure: TRANSESOPHAGEAL ECHOCARDIOGRAM (TEE);  Surgeon: Gaye Pollack, MD;  Location: [place];  Service: Open Heart Surgery;  Laterality: N/A;    There were no vitals filed for this visit.      Subjective Assessment - 09/28/16 1056    Subjective Pt reports that he id doing well, No pain just some discomfort   Currently in Pain? No/denies   Pain Score 0-No pain            [facility] PT Assessment - 09/28/16 0001      AROM   AROM Assessment Site Shoulder   Right/Left Shoulder Right   Right Shoulder Flexion 130 Degrees   Right Shoulder ABduction 135 Degrees                     [facility] Adult PT Treatment/Exercise - 09/28/16 0001      Shoulder Exercises: Supine  Other Supine Exercises 2# punches & circles 2 x 10      Shoulder Exercises: Standing   Flexion 20 reps;Weights   Shoulder Flexion Weight (lbs) 2   ABduction 15 reps;AROM  x2   Extension Both;10 reps;Theraband  x2   Theraband Level (Shoulder Extension) Level 3 (Green)   Other Standing Exercises wand exercises all motions at the shoulder,  circles 1 x 10 1lb   Other Standing Exercises Er/IR with yellow tband 2x 10; 7lb shrug with rev rolls 2x10; OHP yelloow ball 2x10     Shoulder Exercises: ROM/Strengthening   UBE (Upper Arm Bike) L 4 3 min forward 3 min backwards     Modalities   Modalities Vasopneumatic     Vasopneumatic   Number Minutes Vasopneumatic  15 minutes   Vasopnuematic Location  Shoulder   Vasopneumatic Pressure Medium   Vasopneumatic Temperature  32                  PT Short Term Goals - 08/31/16 1146      PT  SHORT TERM GOAL #1   Title independent with initial HEP   Time 2   Period Weeks   Status New           PT Long Term Goals - 08/31/16 1147      PT LONG TERM GOAL #1   Title independent and safe with return to gym   Time 8   Period Weeks   Status New     PT LONG TERM GOAL #2   Title decrease pain 50%   Time 8   Period Weeks   Status New     PT LONG TERM GOAL #3   Title dress and do ADL's without pain   Time 8   Period Weeks   Status New     PT LONG TERM GOAL #4   Title increase right shoulder flexion to 150 degrees   Time 8   Period Weeks   Status New     PT LONG TERM GOAL #5   Title increase AROM of right shoulder IR to 65 degrees   Time 8   Period Weeks   Status New               Plan - 09/28/16 1138    Clinical Impression Statement Pt able to tolerate all of today's interventions well, evident by no subjective reports of increase pain. Pt with some R shoulder elevation with standing abd & flexion. Pt stated that standing abduction was difficult. Fatigues quick with wall circles   Rehab Potential Good   PT Frequency 2x / week   PT Duration 8 weeks   PT Treatment/Interventions ADLs/Self Care Home Management;Cryotherapy;Electrical Stimulation;Moist Heat;Therapeutic activities;Therapeutic exercise;Neuromuscular re-education;Patient/family education;Manual techniques;Vasopneumatic Device   PT Next Visit Plan Continue with AAROM and light exercises.       Patient will benefit from skilled therapeutic intervention in order to improve the following deficits and impairments:  Decreased activity tolerance, Decreased strength, Increased edema, Decreased range of motion, Increased muscle spasms, Impaired flexibility, Improper body mechanics, Postural dysfunction, Pain  Visit Diagnosis: Stiffness of right shoulder, not elsewhere classified  Localized swelling, mass and lump, right upper limb  Acute pain of right shoulder     Problem List Patient Active  Problem List   Diagnosis Date Noted  . Abnormal myocardial perfusion study 10/02/2014  . Tobacco use 04/08/2014  . PAD (peripheral artery disease) ([place])   . Orthostasis   . CAD (coronary  artery disease) of artery bypass graft   . Gout   . Essential hypertension, benign 06/13/2013  . 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 Type II or unspecified type diabetes mellitus without mention of complication, uncontrolled 01/25/2013  . Hyperlipidemia 01/25/2013    Scot Jun, PTA 09/28/2016, 11:45 AM  [place], [place], [address] Phone: [phone]   Fax:  [phone]  Name: Mario [name] MRN: [id number] Date of Birth: [birth year]

## 2016-09-30 ENCOUNTER — Other Ambulatory Visit (INDEPENDENT_AMBULATORY_CARE_PROVIDER_SITE_OTHER): Payer: PPO

## 2016-09-30 DIAGNOSIS — E1165 Type 2 diabetes mellitus with hyperglycemia: Secondary | ICD-10-CM | POA: Diagnosis not present

## 2016-09-30 LAB — BASIC METABOLIC PANEL
BUN: 27 mg/dL — ABNORMAL HIGH (ref 6–23)
CO2: 24 mEq/L (ref 19–32)
Calcium: 9.1 mg/dL (ref 8.4–10.5)
Chloride: 109 mEq/L (ref 96–112)
Creatinine, Ser: 1.06 mg/dL (ref 0.40–1.50)
GFR: 72.3 mL/min (ref >60.00)
Glucose, Bld: 138 mg/dL — ABNORMAL HIGH (ref 70–99)
Potassium: 4.3 mEq/L (ref 3.5–5.1)
Sodium: 139 mEq/L (ref 135–145)

## 2016-09-30 LAB — HEMOGLOBIN A1C: Hgb A1c MFr Bld: 7.2 % — ABNORMAL HIGH (ref 4.6–6.5)

## 2016-10-03 ENCOUNTER — Encounter: Payer: Self-pay | Admitting: Endocrinology

## 2016-10-03 ENCOUNTER — Ambulatory Visit (INDEPENDENT_AMBULATORY_CARE_PROVIDER_SITE_OTHER): Payer: PPO | Admitting: Endocrinology

## 2016-10-03 ENCOUNTER — Ambulatory Visit: Payer: PPO | Admitting: Endocrinology

## 2016-10-03 VITALS — BP 130/78 | HR 84 | Ht 72.0 in | Wt 162.6 lb

## 2016-10-03 DIAGNOSIS — L97909 Non-pressure chronic ulcer of unspecified part of unspecified lower leg with unspecified severity: Secondary | ICD-10-CM | POA: Diagnosis not present

## 2016-10-03 DIAGNOSIS — I739 Peripheral vascular disease, unspecified: Secondary | ICD-10-CM

## 2016-10-03 DIAGNOSIS — E1165 Type 2 diabetes mellitus with hyperglycemia: Secondary | ICD-10-CM

## 2016-10-03 MED ORDER — GLUCOSE BLOOD VI STRP: ORAL_STRIP | 3 refills | Status: AC

## 2016-10-03 NOTE — Progress Notes (Signed)
Patient ID: Mario [name], male   DOB: 04/24/1941, 76 y.o.   MRN: [id number]   Reason for Appointment: Diabetes follow-up   History of Present Illness   Diagnosis: Type 2 DIABETES MELITUS, date of diagnosis:  1989     Previous history: He was initially diagnosed when hospitalized for pancreatitis and his previous endocrinologist had treated him with multiple medications because of progression of his diabetes. He was also put on Cycloset  which he has tolerated maximum dose He has had adjustments of his medication dosages the last couple of years and Januvia was restarted in 5/14 when blood sugars were higher. Dosages have been adjusted based on renal function Previously an A1c levels have been ranging from 6.6-7.5 His metformin was increased  when renal function was better and glipizide was changed to glipizide ER 10 mg . Did not appear to have better blood sugar control with Invokana He was started on Victoza for improved control when his A1c was 7.9% in 02/2015  Recent history:   Non-insulin hypoglycemic drugs:  Glyset bid, glipizide ER 10 mg , Metformin 1 g twice a day,   Victoza 1.2 mg daily         His A1c is  fairly stable now at 7.2, previously 7.5  Blood sugar patterns and problems:  He lost his meter a couple of weeks ago or so and he does not remember his readings  He does not think he is checking his blood sugar in the mornings much and is mostly checking them after supper  Despite reminders he has not done much exercise lately even though he was going to start using his gym at the apartment and fried the exercise bike  Lab glucose was 138, just after breakfast  Weight is about the same  He will take his Glyset right before eating but not consistently.  He was told to take this if he is eating a third meal like lunch also  Previously his fasting readings had been averaging about 150  Side effects from medications: None.  Monitors blood glucose: Once a  day or less .    Glucometer: Freestyle    Blood Glucose readings from recall  Mean values apply above for all meters except median for One Touch  PRE-MEAL Fasting Lunch Dinner PCS  Overall  Glucose range:    160 90-200  Mean/median:         Meals: 2-3 meals per day, no lunch often (yogurt); breakfast is cereal, [place] bacon, sometimes bread and juice at breakfast, sweets In the evening occasionally     Dietician 3/16       Physical activity: exercise:  going to the gym 3/7, swims       Wt Readings from Last 3 Encounters:  10/03/16 162 lb 9.6 oz (73.8 kg)  07/06/16 164 lb (74.4 kg)  04/06/16 165 lb (74.8 kg)    Lab Results  Component Value Date   HGBA1C 7.2 (H) 09/30/2016   HGBA1C 7.5 (H) 07/04/2016   HGBA1C 7.4 (H) 04/05/2016   Lab Results  Component Value Date   MICROALBUR 8.7 (H) 04/06/2016   LDLCALC 42 12/28/2015   CREATININE 1.06 09/30/2016     LABS:  Lab on 09/30/2016  Component Date Value Ref Range Status  . Hgb A1c MFr Bld 09/30/2016 7.2* 4.6 - 6.5 % Final   Glycemic Control Guidelines for People with Diabetes:Non Diabetic:  <6%Goal of Therapy: <7%Additional Action Suggested:  >8%   . Sodium 09/30/2016 139  135 - 145 mEq/L Final  . Potassium 09/30/2016 4.3  3.5 - 5.1 mEq/L Final  . Chloride 09/30/2016 109  96 - 112 mEq/L Final  . CO2 09/30/2016 24  19 - 32 mEq/L Final  . Glucose, Bld 09/30/2016 138* 70 - 99 mg/dL Final  . BUN 09/30/2016 27* 6 - 23 mg/dL Final  . Creatinine, Ser 09/30/2016 1.06  0.40 - 1.50 mg/dL Final  . Calcium 09/30/2016 9.1  8.4 - 10.5 mg/dL Final  . GFR 09/30/2016 72.30  >60.00 mL/min Final    Allergies as of 10/03/2016      Reactions   Penicillins Anaphylaxis   Plavix [clopidogrel Bisulfate] Hives   Pletal [cilostazol] Itching      Medication List       Accurate as of 10/03/16  3:13 PM. Always use your most recent med list.          acetaminophen 500 MG tablet Commonly known as:  TYLENOL Take 500 mg by mouth daily as  needed.   allopurinol 100 MG tablet Commonly known as:  ZYLOPRIM Take 1 tablet (100 mg total) by mouth daily.   aspirin 325 MG EC tablet Take 1 tablet (325 mg total) by mouth daily.   atorvastatin 40 MG tablet Commonly known as:  LIPITOR TAKE 1 TABLET EVERY DAY AT  6 PM   Bromocriptine Mesylate 0.8 MG Tabs Take 4 mg by mouth daily.   carvedilol 3.125 MG tablet Commonly known as:  COREG TAKE 1 TABLET (3.125 MG TOTAL) BY MOUTH 2 (TWO) TIMES DAILY.   cholecalciferol 1000 units tablet Commonly known as:  VITAMIN D Take 2,000 Units by mouth daily.   Co-Enzyme Q-10 100 MG Caps Take 100 mg by mouth daily.   ferrous sulfate 325 (65 FE) MG tablet Take 325 mg by mouth daily.   finasteride 5 MG tablet Commonly known as:  PROSCAR Take 5 mg by mouth daily.   folic acid 242 MCG tablet Commonly known as:  FOLVITE Take 800 mcg by mouth daily.   FREESTYLE FREEDOM LITE w/Device Kit USE AS DIRECTED TO  CHECK  BLOOD  SUGAR  TWICE  DAILY   freestyle lancets Use as instructed to check blood sugar 2 times per day dx code E11.65   glipiZIDE 10 MG 24 hr tablet Commonly known as:  GLUCOTROL XL Take 1 tablet (10 mg total) by mouth at bedtime.   glucose blood test strip Commonly known as:  FREESTYLE LITE Use as instructed to check blood sugar 2 times per day dx code E11.65   Insulin Pen Needle 31G X 8 MM Misc Use to inject insulin 1 time per day   lisinopril 5 MG tablet Commonly known as:  PRINIVIL,ZESTRIL Take 1 tablet (5 mg total) by mouth daily. *Patient needs to call and schedule a one year follow up appointment for further refills*   metFORMIN 1000 MG tablet Commonly known as:  GLUCOPHAGE Take 1 tablet (1,000 mg total) by mouth 2 (two) times daily with a meal.   miglitol 50 MG tablet Commonly known as:  GLYSET Take 1 tablet (50 mg total) by mouth 3 (three) times daily with meals.   NITROSTAT 0.4 MG SL tablet Generic drug:  nitroGLYCERIN PLACE 1 TABLET (0.4 MG TOTAL)  UNDER THE TONGUE EVERY 5 (FIVE) MINUTES AS NEEDED.   omega-3 acid ethyl esters 1 g capsule Commonly known as:  LOVAZA Take 1 g by mouth daily.   PRESERVISION AREDS 2 Caps Take 2 capsules by mouth daily. Reported on  09/18/2015   tamsulosin 0.4 MG Caps capsule Commonly known as:  FLOMAX Take 1 capsule (0.4 mg total) by mouth daily.   VICTOZA 18 MG/3ML Sopn Generic drug:  liraglutide Inject 0.2 mLs (1.2 mg total) into the skin daily. Inject once daily at the same time   vitamin B-12 500 MCG tablet Commonly known as:  CYANOCOBALAMIN Take 1,000 mcg by mouth daily.   vitamin E 100 UNIT capsule Take 100 Units by mouth daily.       Allergies:  Allergies  Allergen Reactions  . Penicillins Anaphylaxis  . Plavix [Clopidogrel Bisulfate] Hives  . Pletal [Cilostazol] Itching    Past Medical History:  Diagnosis Date  . Arthritis   . CAD (coronary artery disease)    a.  s/p IMI and BMS 1997;  b. Myoview 4/16:  inf scar with peri-infarct ischemia, EF 34%; high risk;  c. LHC 5/16:  3 v CAD >> CABG (free L-LAD, S-OM, S-dRCA)  . Carotid stenosis    a. carotid US 6/16:  bilat ICA 1-39%  . Chronic kidney disease, stage II (mild)   . Chronic systolic CHF (congestive heart failure) ([place])   . Essential hypertension, benign   . Gout   . HLD (hyperlipidemia)   . Ischemic cardiomyopathy    a. EF by myoview 4/16 34%; b. LHC 5/16: EF 40-45%;  c. intraop TEE 6/16: EF 45-50%  . Orthostasis   . PAD (peripheral artery disease) (H[facility])    Dr. Fletcher Anon  . Pneumonia    hx of  . Type II or unspecified type diabetes mellitus without mention of complication, uncontrolled     Past Surgical History:  Procedure Laterality Date  . ABDOMINAL AORTAGRAM N/A 10/09/2013   Procedure: ABDOMINAL Maxcine Ham;  Surgeon: Wellington Hampshire, MD;  Location: [place] [facility] CATH LAB;  Service: Cardiovascular;  Laterality: N/A;  . CARDIAC CATHETERIZATION N/A 10/02/2014   Procedure: Left Heart Cath and Coronary Angiography;  Surgeon:  Belva Crome, MD;  Location: [place] CV LAB;  Service: Cardiovascular;  Laterality: N/A;  . CATARACT EXTRACTION    . CHOLECYSTECTOMY    . COLONOSCOPY    . CORONARY ARTERY BYPASS GRAFT N/A 11/03/2014   Procedure: CORONARY ARTERY BYPASS GRAFTING  times three using left internal mammary and right greater saphenous vein;  Surgeon: Gaye Pollack, MD;  Location: [place] OR;  Service: Open Heart Surgery;  Laterality: N/A;  . HERNIA REPAIR    . MELANOMA EXCISION    . SKIN LESION EXCISION     multiple  . TEE WITHOUT CARDIOVERSION N/A 11/03/2014   Procedure: TRANSESOPHAGEAL ECHOCARDIOGRAM (TEE);  Surgeon: Gaye Pollack, MD;  Location: [facility];  Service: Open Heart Surgery;  Laterality: N/A;    Family History  Problem Relation Age of Onset  . Colon cancer Father   . Heart disease Mother     Social History:  reports that he quit smoking about 1 years ago. His smoking use included Cigarettes. He has a 25.00 pack-year smoking history. He has never used smokeless tobacco. He reports that he does not drink alcohol or use drugs.  Review of Systems:   Hypertension:  currently taking lisinopril 5 mg and carvedilol low-dose and blood pressure is well-controlled, managed by cardiologist  Lipids: Well controlled with atorvastatin 40 mg .  HDL is  usually low, triglycerides normal   Lab Results  Component Value Date   CHOL 106 12/28/2015   HDL 34.90 (L) 12/28/2015   LDLCALC 42 12/28/2015   TRIG 143.0 12/28/2015  CHOLHDL 3 12/28/2015    Foot exam done in 8/17, has callus formation on the left and significant sensory loss as well as decreased pulses    Examination:   BP 130/78   Pulse 84   Ht 6' (1.829 m)   Wt 162 lb 9.6 oz (73.8 kg)   SpO2 98%   BMI 22.05 kg/m   Body mass index is 22.05 kg/m.   Diabetic Foot Exam - Simple   Simple Foot Form Diabetic Foot exam was performed with the following findings:  Yes   Visual Inspection No deformities, no ulcerations, no other skin breakdown  bilaterally:  Yes See comments:  Yes Sensation Testing See comments:  Yes Pulse Check See comments:  Yes Comments Decreased to absent pedal pulses on the right Has a shallow noninfected ulcer on the medial lower leg just above the ankle No cyanosis Decreased to absent monofilament sensation in the  toes      ASSESSMENT/ PLAN:   Diabetes type 2:  See history of present illness for detailed discussion of his current management, blood sugar patterns and problems identified  Blood glucose control is  stable and reasonably good for his age and duration of diabetes with A1c 7.2 On multiple medications He has not checked his blood sugar much and needs a new meter, he does not think his blood sugars are higher than the targeted usually New glucose monitor given today  He does need to start exercising regularly  Nonhealing ulcer on the right lower leg: This may be related to vascular insufficiency and will get vascular surgery consultation  We will see him back in 3 months with another A1c   Patient Instructions  Exercise 3-4 x weekly  Check blood sugars on waking up  2x weekly  Also check blood sugars about 2 hours after a meal and do this after different meals by rotation  Recommended blood sugar levels on waking up is 90-130 and about 2 hours after meal is 130-160  Please bring your blood sugar monitor to each visit, thank you     [facility] 10/03/2016, 3:13 PM

## 2016-10-03 NOTE — Patient Instructions (Signed)
Exercise 3-4 x weekly  Check blood sugars on waking up  2x weekly  Also check blood sugars about 2 hours after a meal and do this after different meals by rotation  Recommended blood sugar levels on waking up is 90-130 and about 2 hours after meal is 130-160  Please bring your blood sugar monitor to each visit, thank you

## 2016-10-05 ENCOUNTER — Telehealth: Payer: Self-pay | Admitting: Interventional Cardiology

## 2016-10-05 ENCOUNTER — Ambulatory Visit: Payer: PPO | Admitting: Physical Therapy

## 2016-10-05 ENCOUNTER — Encounter: Payer: Self-pay | Admitting: Physical Therapy

## 2016-10-05 DIAGNOSIS — I10 Essential (primary) hypertension: Secondary | ICD-10-CM

## 2016-10-05 DIAGNOSIS — M25611 Stiffness of right shoulder, not elsewhere classified: Secondary | ICD-10-CM | POA: Diagnosis not present

## 2016-10-05 DIAGNOSIS — M25511 Pain in right shoulder: Secondary | ICD-10-CM

## 2016-10-05 DIAGNOSIS — R2231 Localized swelling, mass and lump, right upper limb: Secondary | ICD-10-CM

## 2016-10-05 MED ORDER — CARVEDILOL 3.125 MG PO TABS
3.1250 mg | ORAL_TABLET | Freq: Two times a day (BID) | ORAL | 2 refills | Status: DC
Start: 2016-10-05 — End: 2017-01-04

## 2016-10-05 MED ORDER — LISINOPRIL 5 MG PO TABS: 5.0000 mg | ORAL_TABLET | Freq: Every day | ORAL | 2 refills | Status: DC

## 2016-10-05 NOTE — Telephone Encounter (Signed)
New message     *STAT* If patient is at the pharmacy, call can be transferred to refill team.   1. Which medications need to be refilled? (please list name of each medication and dose if known) lisinopril carvedilol   2. Which pharmacy/location (including street and city if local pharmacy) is medication to be sent to?[facility] [address]  3. Do they need a 30 day or 90 day supply? 30 day

## 2016-10-05 NOTE — Telephone Encounter (Signed)
Pt's medication was sent to pt's pharmacy as requested. Confirmation received.  °

## 2016-10-05 NOTE — Therapy (Signed)
[place], [place], [address] Phone: [phone]   Fax:  [phone]  Physical Therapy Treatment  Patient Details  Name: Mario [name] MRN: [id number] Date of Birth: 24-Mar-1941 Referring Provider: Onnie Graham  Encounter Date: 10/05/2016      PT End of Session - 10/05/16 0959    Visit Number 5   Date for PT Re-Evaluation 10/31/16   PT Start Time 0927   PT Stop Time 1015   PT Time Calculation (min) 48 min   Activity Tolerance Patient tolerated treatment well   Behavior During Therapy [facility] for tasks assessed/performed      Past Medical History:  Diagnosis Date  . Arthritis   . CAD (coronary artery disease)    a.  s/p IMI and BMS 1997;  b. Myoview 4/16:  inf scar with peri-infarct ischemia, EF 34%; high risk;  c. LHC 5/16:  3 v CAD >> CABG (free L-LAD, S-OM, S-dRCA)  . Carotid stenosis    a. carotid US 6/16:  bilat ICA 1-39%  . Chronic kidney disease, stage II (mild)   . Chronic systolic CHF (congestive heart failure) ([place])   . Essential hypertension, benign   . Gout   . HLD (hyperlipidemia)   . Ischemic cardiomyopathy    a. EF by myoview 4/16 34%; b. LHC 5/16: EF 40-45%;  c. intraop TEE 6/16: EF 45-50%  . Orthostasis   . PAD (peripheral artery disease) (HCC)    Dr. Fletcher Anon  . Pneumonia    hx of  . Type II or unspecified type diabetes mellitus without mention of complication, uncontrolled     Past Surgical History:  Procedure Laterality Date  . ABDOMINAL AORTAGRAM N/A 10/09/2013   Procedure: ABDOMINAL Maxcine Ham;  Surgeon: Wellington Hampshire, MD;  Location: [facility] CATH LAB;  Service: Cardiovascular;  Laterality: N/A;  . CARDIAC CATHETERIZATION N/A 10/02/2014   Procedure: Left Heart Cath and Coronary Angiography;  Surgeon: Belva Crome, MD;  Location: [facility] CV LAB;  Service: Cardiovascular;  Laterality: N/A;  . CATARACT EXTRACTION    . CHOLECYSTECTOMY    . COLONOSCOPY    . CORONARY ARTERY  BYPASS GRAFT N/A 11/03/2014   Procedure: CORONARY ARTERY BYPASS GRAFTING  times three using left internal mammary and right greater saphenous vein;  Surgeon: Gaye Pollack, MD;  Location: [facility] OR;  Service: Open Heart Surgery;  Laterality: N/A;  . HERNIA REPAIR    . MELANOMA EXCISION    . SKIN LESION EXCISION     multiple  . TEE WITHOUT CARDIOVERSION N/A 11/03/2014   Procedure: TRANSESOPHAGEAL ECHOCARDIOGRAM (TEE);  Surgeon: Gaye Pollack, MD;  Location: [facility];  Service: Open Heart Surgery;  Laterality: N/A;    There were no vitals filed for this visit.      Subjective Assessment - 10/05/16 0929    Subjective "Good Im feeling all right, shoulder feeling pretty good, better and better each day"   Currently in Pain? No/denies   Pain Score 0-No pain                         [facility] Adult PT Treatment/Exercise - 10/05/16 0001      Shoulder Exercises: Standing   Flexion 20 reps;Weights   Shoulder Flexion Weight (lbs) 1   Extension Both;10 reps;Theraband  x2   Theraband Level (Shoulder Extension) Level 1 (Yellow)   Row 10 reps;Theraband  x2   Theraband Level (Shoulder Row) Level  1 (Yellow)   Other Standing Exercises wand exercises all motions at the shoulder; bicep curls 3lb 2x10    Other Standing Exercises Er/IR with yellow tband 2x10; 6lb shrug with rev rolls 2x10; OHP yellow ball 2x10     Shoulder Exercises: ROM/Strengthening   UBE (Upper Arm Bike) L 4 3 min forward 3 min backwards     Modalities   Modalities Vasopneumatic     Vasopneumatic   Number Minutes Vasopneumatic  15 minutes   Vasopnuematic Location  Shoulder   Vasopneumatic Pressure Medium   Vasopneumatic Temperature  32                  PT Short Term Goals - 08/31/16 1146      PT SHORT TERM GOAL #1   Title independent with initial HEP   Time 2   Period Weeks   Status New           PT Long Term Goals - 08/31/16 1147      PT LONG TERM GOAL #1   Title independent and safe with  return to gym   Time 8   Period Weeks   Status New     PT LONG TERM GOAL #2   Title decrease pain 50%   Time 8   Period Weeks   Status New     PT LONG TERM GOAL #3   Title dress and do ADL's without pain   Time 8   Period Weeks   Status New     PT LONG TERM GOAL #4   Title increase right shoulder flexion to 150 degrees   Time 8   Period Weeks   Status New     PT LONG TERM GOAL #5   Title increase AROM of right shoulder IR to 65 degrees   Time 8   Period Weeks   Status New               Plan - 10/05/16 6213    Clinical Impression Statement Pt able to complete all of today's exercises well, does require cues to maintain appropriate posture with most interventions. Pt reports going to the doctor Monday and he was pleased. Pt reports going the gym and doing chest press and chess fly's, Pt advised that those exercises were to aggressive at this time and to refrain from doing them.   Rehab Potential Good   PT Frequency 2x / week   PT Duration 8 weeks   PT Treatment/Interventions ADLs/Self Care Home Management;Cryotherapy;Electrical Stimulation;Moist Heat;Therapeutic activities;Therapeutic exercise;Neuromuscular re-education;Patient/family education;Manual techniques;Vasopneumatic Device   PT Next Visit Plan Continue with AAROM and light exercises.       Patient will benefit from skilled therapeutic intervention in order to improve the following deficits and impairments:  Decreased activity tolerance, Decreased strength, Increased edema, Decreased range of motion, Increased muscle spasms, Impaired flexibility, Improper body mechanics, Postural dysfunction, Pain  Visit Diagnosis: Stiffness of right shoulder, not elsewhere classified  Localized swelling, mass and lump, right upper limb  Acute pain of right shoulder     Problem List Patient Active Problem List   Diagnosis Date Noted  . Abnormal myocardial perfusion study 10/02/2014  . Tobacco use 04/08/2014  .  PAD (peripheral artery disease) ([place])   . Orthostasis   . CAD (coronary artery disease) of artery bypass graft   . Gout   . Essential hypertension, benign 06/13/2013  . 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 Type II or unspecified type diabetes  mellitus without mention of complication, uncontrolled 01/25/2013  . Hyperlipidemia 01/25/2013    Scot Jun 10/05/2016, 10:03 AM  [place] [address] [place] [place], [place], [address] Phone: [phone]   Fax:  [phone]  Name: Taje Littler MRN: [id number] Date of Birth: [birth year]

## 2016-10-06 ENCOUNTER — Ambulatory Visit (INDEPENDENT_AMBULATORY_CARE_PROVIDER_SITE_OTHER): Payer: PPO | Admitting: Ophthalmology

## 2016-10-09 ENCOUNTER — Other Ambulatory Visit: Payer: Self-pay | Admitting: Interventional Cardiology

## 2016-10-12 ENCOUNTER — Ambulatory Visit: Payer: PPO | Admitting: Physical Therapy

## 2016-10-12 ENCOUNTER — Encounter: Payer: Self-pay | Admitting: Physical Therapy

## 2016-10-12 DIAGNOSIS — M25611 Stiffness of right shoulder, not elsewhere classified: Secondary | ICD-10-CM | POA: Diagnosis not present

## 2016-10-12 DIAGNOSIS — M25511 Pain in right shoulder: Secondary | ICD-10-CM

## 2016-10-12 DIAGNOSIS — R2231 Localized swelling, mass and lump, right upper limb: Secondary | ICD-10-CM

## 2016-10-12 NOTE — Therapy (Signed)
[place], [place], [address] Phone: [phone]   Fax:  [phone]  Physical Therapy Treatment  Patient Details  Name: Mario [name] MRN: [id number] Date of Birth: 02-Apr-1941 Referring Provider: Onnie Graham  Encounter Date: 10/12/2016      PT End of Session - 10/12/16 1052    Visit Number 6   Date for PT Re-Evaluation 10/31/16   PT Start Time 1010   PT Stop Time 1105   PT Time Calculation (min) 55 min   Activity Tolerance Patient tolerated treatment well   Behavior During Therapy [facility] for tasks assessed/performed      Past Medical History:  Diagnosis Date  . Arthritis   . CAD (coronary artery disease)    a.  s/p IMI and BMS 1997;  b. Myoview 4/16:  inf scar with peri-infarct ischemia, EF 34%; high risk;  c. LHC 5/16:  3 v CAD >> CABG (free L-LAD, S-OM, S-dRCA)  . Carotid stenosis    a. carotid US 6/16:  bilat ICA 1-39%  . Chronic kidney disease, stage II (mild)   . Chronic systolic CHF (congestive heart failure) ([place])   . Essential hypertension, benign   . Gout   . HLD (hyperlipidemia)   . Ischemic cardiomyopathy    a. EF by myoview 4/16 34%; b. LHC 5/16: EF 40-45%;  c. intraop TEE 6/16: EF 45-50%  . Orthostasis   . PAD (peripheral artery disease) (HCC)    Dr. Fletcher Anon  . Pneumonia    hx of  . Type II or unspecified type diabetes mellitus without mention of complication, uncontrolled     Past Surgical History:  Procedure Laterality Date  . ABDOMINAL AORTAGRAM N/A 10/09/2013   Procedure: ABDOMINAL Maxcine Ham;  Surgeon: Wellington Hampshire, MD;  Location: [facility] CATH LAB;  Service: Cardiovascular;  Laterality: N/A;  . CARDIAC CATHETERIZATION N/A 10/02/2014   Procedure: Left Heart Cath and Coronary Angiography;  Surgeon: Belva Crome, MD;  Location: [facility] CV LAB;  Service: Cardiovascular;  Laterality: N/A;  . CATARACT EXTRACTION    . CHOLECYSTECTOMY    . COLONOSCOPY    . CORONARY ARTERY  BYPASS GRAFT N/A 11/03/2014   Procedure: CORONARY ARTERY BYPASS GRAFTING  times three using left internal mammary and right greater saphenous vein;  Surgeon: Gaye Pollack, MD;  Location: [facility] OR;  Service: Open Heart Surgery;  Laterality: N/A;  . HERNIA REPAIR    . MELANOMA EXCISION    . SKIN LESION EXCISION     multiple  . TEE WITHOUT CARDIOVERSION N/A 11/03/2014   Procedure: TRANSESOPHAGEAL ECHOCARDIOGRAM (TEE);  Surgeon: Gaye Pollack, MD;  Location: [facility];  Service: Open Heart Surgery;  Laterality: N/A;    There were no vitals filed for this visit.      Subjective Assessment - 10/12/16 1011    Subjective "OK, well I got a little ache in my shoulder but its ok"   Currently in Pain? Yes   Pain Score 3    Pain Location Shoulder   Pain Orientation Right                         [facility] Adult PT Treatment/Exercise - 10/12/16 0001      Shoulder Exercises: Standing   External Rotation 10 reps;Theraband;Both;Strengthening  x2   Theraband Level (Shoulder External Rotation) Level 1 (Yellow)   Internal Rotation 10 reps;Theraband;Both  x2   Theraband Level (Shoulder Internal Rotation)  Level 1 (Yellow)   Flexion 20 reps;Weights;Both   Shoulder Flexion Weight (lbs) 1   ABduction AROM;Both;20 reps   Shoulder ABduction Weight (lbs) 1   Extension Both;10 reps;Theraband  x2   Theraband Level (Shoulder Extension) Level 3 (Green)   Row Theraband;15 reps;Both  x2   Theraband Level (Shoulder Row) Level 4 (Blue)   Other Standing Exercises tricep ext 25lb & bicep curls 15lb 2x15   Other Standing Exercises OHP yellow ball 2x15      Shoulder Exercises: ROM/Strengthening   UBE (Upper Arm Bike) L 5 3 min forward 3 min backwards     Shoulder Exercises: Power Leisure centre manager & lats 20lb 2x10   Other Power Tower Exercises chest press 5 lb 2x10      Modalities   Modalities Vasopneumatic     Vasopneumatic   Number Minutes Vasopneumatic  15 minutes    Vasopnuematic Location  Shoulder   Vasopneumatic Pressure Medium   Vasopneumatic Temperature  32                  PT Short Term Goals - 10/12/16 1056      PT SHORT TERM GOAL #1   Title independent with initial HEP   Status Achieved           PT Long Term Goals - 10/12/16 1056      PT LONG TERM GOAL #1   Title independent and safe with return to gym   Status Partially Met     PT LONG TERM GOAL #2   Title decrease pain 50%   Status Achieved     PT LONG TERM GOAL #3   Title dress and do ADL's without pain   Status Achieved     PT LONG TERM GOAL #4   Title increase right shoulder flexion to 150 degrees   Status On-going     PT LONG TERM GOAL #5   Title increase AROM of right shoulder IR to 65 degrees   Status On-going               Plan - 10/12/16 1053    Clinical Impression Statement Pt with a progression to more machine level interventions for strengthening. During today's session pt reports no increase in pain only some muscle soreness. Pt reports that he has been going to the gym and using the row machine with light weight.   Rehab Potential Good   PT Frequency 2x / week   PT Duration 8 weeks   PT Treatment/Interventions ADLs/Self Care Home Management;Cryotherapy;Electrical Stimulation;Moist Heat;Therapeutic activities;Therapeutic exercise;Neuromuscular re-education;Patient/family education;Manual techniques;Vasopneumatic Device   PT Next Visit Plan R shoulder strengthening, pt would like to return to pickle ball      Patient will benefit from skilled therapeutic intervention in order to improve the following deficits and impairments:  Decreased activity tolerance, Decreased strength, Increased edema, Decreased range of motion, Increased muscle spasms, Impaired flexibility, Improper body mechanics, Postural dysfunction, Pain  Visit Diagnosis: Stiffness of right shoulder, not elsewhere classified  Localized swelling, mass and lump, right upper  limb  Acute pain of right shoulder     Problem List Patient Active Problem List   Diagnosis Date Noted  . Abnormal myocardial perfusion study 10/02/2014  . Tobacco use 04/08/2014  . PAD (peripheral artery disease) ([place])   . Orthostasis   . CAD (coronary artery disease) of artery bypass graft   . Gout   . Essential hypertension, benign 06/13/2013  . Chronic  kidney disease, stage II (mild) 04/12/2013  . Type II or unspecified type diabetes mellitus without mention of complication, uncontrolled 01/25/2013  . Hyperlipidemia 01/25/2013    Scot Jun, PTA 10/12/2016, 10:58 AM  [place], [place], [address] Phone: [phone]   Fax:  [phone]  Name: Marques Ericson MRN: [id number] Date of Birth: 02-25-1941

## 2016-10-13 ENCOUNTER — Other Ambulatory Visit: Payer: Self-pay | Admitting: *Deleted

## 2016-10-13 DIAGNOSIS — L97909 Non-pressure chronic ulcer of unspecified part of unspecified lower leg with unspecified severity: Principal | ICD-10-CM

## 2016-10-13 DIAGNOSIS — I83009 Varicose veins of unspecified lower extremity with ulcer of unspecified site: Secondary | ICD-10-CM

## 2016-10-19 ENCOUNTER — Ambulatory Visit: Payer: PPO | Admitting: Physical Therapy

## 2016-10-19 ENCOUNTER — Encounter: Payer: Self-pay | Admitting: Physical Therapy

## 2016-10-19 DIAGNOSIS — M25511 Pain in right shoulder: Secondary | ICD-10-CM

## 2016-10-19 DIAGNOSIS — M25611 Stiffness of right shoulder, not elsewhere classified: Secondary | ICD-10-CM | POA: Diagnosis not present

## 2016-10-19 DIAGNOSIS — R2231 Localized swelling, mass and lump, right upper limb: Secondary | ICD-10-CM

## 2016-10-19 NOTE — Therapy (Signed)
[place], [place], [address] Phone: [phone]   Fax:  [phone]  Physical Therapy Treatment  Patient Details  Name: Mario [name] MRN: [id number] Date of Birth: 02-Jun-1940 Referring Provider: Onnie Graham  Encounter Date: 10/19/2016      PT End of Session - 10/19/16 7622    Visit Number 7   Date for PT Re-Evaluation 10/31/16   PT Start Time 1300   PT Stop Time 1358   PT Time Calculation (min) 58 min   Activity Tolerance Patient tolerated treatment well   Behavior During Therapy [facility] for tasks assessed/performed      Past Medical History:  Diagnosis Date  . Arthritis   . CAD (coronary artery disease)    a.  s/p IMI and BMS 1997;  b. Myoview 4/16:  inf scar with peri-infarct ischemia, EF 34%; high risk;  c. LHC 5/16:  3 v CAD >> CABG (free L-LAD, S-OM, S-dRCA)  . Carotid stenosis    a. carotid US 6/16:  bilat ICA 1-39%  . Chronic kidney disease, stage II (mild)   . Chronic systolic CHF (congestive heart failure) ([place])   . Essential hypertension, benign   . Gout   . HLD (hyperlipidemia)   . Ischemic cardiomyopathy    a. EF by myoview 4/16 34%; b. LHC 5/16: EF 40-45%;  c. intraop TEE 6/16: EF 45-50%  . Orthostasis   . PAD (peripheral artery disease) (HCC)    Dr. Fletcher Anon  . Pneumonia    hx of  . Type II or unspecified type diabetes mellitus without mention of complication, uncontrolled     Past Surgical History:  Procedure Laterality Date  . ABDOMINAL AORTAGRAM N/A 10/09/2013   Procedure: ABDOMINAL Maxcine Ham;  Surgeon: Wellington Hampshire, MD;  Location: [facility] CATH LAB;  Service: Cardiovascular;  Laterality: N/A;  . CARDIAC CATHETERIZATION N/A 10/02/2014   Procedure: Left Heart Cath and Coronary Angiography;  Surgeon: Belva Crome, MD;  Location: [facility] CV LAB;  Service: Cardiovascular;  Laterality: N/A;  . CATARACT EXTRACTION    . CHOLECYSTECTOMY    . COLONOSCOPY    . CORONARY ARTERY  BYPASS GRAFT N/A 11/03/2014   Procedure: CORONARY ARTERY BYPASS GRAFTING  times three using left internal mammary and right greater saphenous vein;  Surgeon: Gaye Pollack, MD;  Location: [facility] OR;  Service: Open Heart Surgery;  Laterality: N/A;  . HERNIA REPAIR    . MELANOMA EXCISION    . SKIN LESION EXCISION     multiple  . TEE WITHOUT CARDIOVERSION N/A 11/03/2014   Procedure: TRANSESOPHAGEAL ECHOCARDIOGRAM (TEE);  Surgeon: Gaye Pollack, MD;  Location: [facility];  Service: Open Heart Surgery;  Laterality: N/A;    There were no vitals filed for this visit.      Subjective Assessment - 10/19/16 1301    Subjective "I have been doing ok"   Currently in Pain? No/denies   Pain Score 0-No pain                         [facility] Adult PT Treatment/Exercise - 10/19/16 0001      Shoulder Exercises: Standing   Flexion 20 reps;Weights;Both   Shoulder Flexion Weight (lbs) 1   ABduction AROM;Both;20 reps   Shoulder ABduction Weight (lbs) 1   Extension Both;Theraband;15 reps  x2   Theraband Level (Shoulder Extension) Level 4 (Blue)   Row Theraband;15 reps;Both  x2   Theraband Level (  Shoulder Row) Level 4 (Blue)   Other Standing Exercises 4 level cabinet reaches 2llb flex and abd  x10      Shoulder Exercises: ROM/Strengthening   UBE (Upper Arm Bike) L 5 3 min forward 3 min backwards     Shoulder Exercises: Power Leisure centre manager & lats 20lb 2x10   Other Power Tower Exercises chest press 5 lb 2x10      Modalities   Modalities Vasopneumatic     Vasopneumatic   Number Minutes Vasopneumatic  15 minutes   Vasopnuematic Location  Shoulder   Vasopneumatic Pressure Medium   Vasopneumatic Temperature  32                  PT Short Term Goals - 10/12/16 1056      PT SHORT TERM GOAL #1   Title independent with initial HEP   Status Achieved           PT Long Term Goals - 10/19/16 1347      PT LONG TERM GOAL #1   Title independent and  safe with return to gym   Status Partially Met     PT LONG TERM GOAL #2   Title decrease pain 50%   Status Achieved     PT LONG TERM GOAL #4   Status Partially Met     PT LONG TERM GOAL #5   Title increase AROM of right shoulder IR to 65 degrees   Status Partially Met               Plan - 10/19/16 1348    Clinical Impression Statement Progressing towards all goals, does reports some soreness with four level cabinet reaches. Able to do active shoulder flexion and abduction without shoulder elevation.   Rehab Potential Good   PT Frequency 2x / week   PT Duration 8 weeks   PT Next Visit Plan R shoulder strengthening, pt would like to return to pickle ball      Patient will benefit from skilled therapeutic intervention in order to improve the following deficits and impairments:  Decreased activity tolerance, Decreased strength, Increased edema, Decreased range of motion, Increased muscle spasms, Impaired flexibility, Improper body mechanics, Postural dysfunction, Pain  Visit Diagnosis: Stiffness of right shoulder, not elsewhere classified  Localized swelling, mass and lump, right upper limb  Acute pain of right shoulder     Problem List Patient Active Problem List   Diagnosis Date Noted  . Abnormal myocardial perfusion study 10/02/2014  . Tobacco use 04/08/2014  . PAD (peripheral artery disease) ([place])   . Orthostasis   . CAD (coronary artery disease) of artery bypass graft   . Gout   . Essential hypertension, benign 06/13/2013  . 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 Type II or unspecified type diabetes mellitus without mention of complication, uncontrolled 01/25/2013  . Hyperlipidemia 01/25/2013    Scot Jun, PTA 10/19/2016, 1:49 PM  [place] [address] [place], [place], [address] Phone: [phone]   Fax:  [phone]  Name: Mario [name] MRN: [id number] Date of  Birth: Jun 13, 1940

## 2016-10-20 DIAGNOSIS — C44622 Squamous cell carcinoma of skin of right upper limb, including shoulder: Secondary | ICD-10-CM | POA: Diagnosis not present

## 2016-10-20 DIAGNOSIS — D485 Neoplasm of uncertain behavior of skin: Secondary | ICD-10-CM | POA: Diagnosis not present

## 2016-10-20 DIAGNOSIS — C44519 Basal cell carcinoma of skin of other part of trunk: Secondary | ICD-10-CM | POA: Diagnosis not present

## 2016-10-20 DIAGNOSIS — C44612 Basal cell carcinoma of skin of right upper limb, including shoulder: Secondary | ICD-10-CM | POA: Diagnosis not present

## 2016-10-20 DIAGNOSIS — D225 Melanocytic nevi of trunk: Secondary | ICD-10-CM | POA: Diagnosis not present

## 2016-10-26 ENCOUNTER — Ambulatory Visit: Payer: PPO | Attending: Orthopedic Surgery | Admitting: Physical Therapy

## 2016-10-26 ENCOUNTER — Encounter: Payer: Self-pay | Admitting: Physical Therapy

## 2016-10-26 DIAGNOSIS — R2231 Localized swelling, mass and lump, right upper limb: Secondary | ICD-10-CM | POA: Insufficient documentation

## 2016-10-26 DIAGNOSIS — M25511 Pain in right shoulder: Secondary | ICD-10-CM | POA: Diagnosis present

## 2016-10-26 DIAGNOSIS — M25611 Stiffness of right shoulder, not elsewhere classified: Secondary | ICD-10-CM | POA: Diagnosis not present

## 2016-10-26 NOTE — Therapy (Signed)
[address] [place], [place], [address] Phone: [phone]   Fax:  [phone]  Physical Therapy Treatment  Patient Details  Name: Mario [name] MRN: [id number] Date of Birth: Oct 31, 1940 Referring Provider: Onnie Graham  Encounter Date: 10/26/2016      PT End of Session - 10/26/16 1101    Visit Number 8   Date for PT Re-Evaluation 10/31/16   PT Start Time 8546   PT Stop Time 1115   PT Time Calculation (min) 60 min      Past Medical History:  Diagnosis Date  . Arthritis   . CAD (coronary artery disease)    a.  s/p IMI and BMS 1997;  b. Myoview 4/16:  inf scar with peri-infarct ischemia, EF 34%; high risk;  c. LHC 5/16:  3 v CAD >> CABG (free L-LAD, S-OM, S-dRCA)  . Carotid stenosis    a. carotid US 6/16:  bilat ICA 1-39%  . Chronic kidney disease, stage II (mild)   . Chronic systolic CHF (congestive heart failure) ([place])   . Essential hypertension, benign   . Gout   . HLD (hyperlipidemia)   . Ischemic cardiomyopathy    a. EF by myoview 4/16 34%; b. LHC 5/16: EF 40-45%;  c. intraop TEE 6/16: EF 45-50%  . Orthostasis   . PAD (peripheral artery disease) (HCC)    Dr. Fletcher Anon  . Pneumonia    hx of  . Type II or unspecified type diabetes mellitus without mention of complication, uncontrolled     Past Surgical History:  Procedure Laterality Date  . ABDOMINAL AORTAGRAM N/A 10/09/2013   Procedure: ABDOMINAL Maxcine Ham;  Surgeon: Wellington Hampshire, MD;  Location: [facility];  Service: Cardiovascular;  Laterality: N/A;  . CARDIAC CATHETERIZATION N/A 10/02/2014   Procedure: Left Heart Cath and Coronary Angiography;  Surgeon: Belva Crome, MD;  Location: [facility];  Service: Cardiovascular;  Laterality: N/A;  . CATARACT EXTRACTION    . CHOLECYSTECTOMY    . COLONOSCOPY    . CORONARY ARTERY BYPASS GRAFT N/A 11/03/2014   Procedure: CORONARY ARTERY BYPASS GRAFTING  times three using left internal mammary and  right greater saphenous vein;  Surgeon: Gaye Pollack, MD;  Location: [facility];  Service: Open Heart Surgery;  Laterality: N/A;  . HERNIA REPAIR    . MELANOMA EXCISION    . SKIN LESION EXCISION     multiple  . TEE WITHOUT CARDIOVERSION N/A 11/03/2014   Procedure: TRANSESOPHAGEAL ECHOCARDIOGRAM (TEE);  Surgeon: Gaye Pollack, MD;  Location: [facility];  Service: Open Heart Surgery;  Laterality: N/A;    There were no vitals filed for this visit.      Subjective Assessment - 10/26/16 1016    Subjective "I went to the gym, and did a few machines"   Currently in Pain? No/denies   Pain Score 0-No pain            [facility] PT Assessment - 10/26/16 0001      AROM   Right Shoulder Flexion 140 Degrees   Right Shoulder ABduction 145 Degrees   Right Shoulder Internal Rotation 75 Degrees   Right Shoulder External Rotation 90 Degrees                     [facility] Adult PT Treatment/Exercise - 10/26/16 0001      Shoulder Exercises: Standing   External Rotation 15 reps;Both;Strengthening;Theraband  x2   Theraband Level (Shoulder External  Rotation) Level 1 (Yellow)   Internal Rotation 15 reps;Theraband;Both   Theraband Level (Shoulder Internal Rotation) Level 3 (Green)   Flexion 20 reps;Weights;Both   Shoulder Flexion Weight (lbs) 1   ABduction AROM;Both;20 reps   Shoulder ABduction Weight (lbs) 1   Extension Both;Theraband;15 reps  x2   Theraband Level (Shoulder Extension) Level 4 (Blue)   Row Theraband;15 reps;Both  x2    Theraband Level (Shoulder Row) Level 4 (Blue)   Other Standing Exercises 4 level cabinet reaches 2llb flex and abd  x10      Shoulder Exercises: ROM/Strengthening   UBE (Upper Arm Bike) L 6 3 min forward 3 min backwards     Shoulder Exercises: Power Leisure centre manager & lats 25lb 2x15   Other Power Tower Exercises chest press 5 lb 2x10      Modalities   Modalities Vasopneumatic     Vasopneumatic   Number Minutes Vasopneumatic  15  minutes   Vasopnuematic Location  Shoulder   Vasopneumatic Pressure Medium   Vasopneumatic Temperature  32                  PT Short Term Goals - 10/12/16 1056      PT SHORT TERM GOAL #1   Title independent with initial HEP   Status Achieved           PT Long Term Goals - 10/19/16 1347      PT LONG TERM GOAL #1   Title independent and safe with return to gym   Status Partially Met     PT LONG TERM GOAL #2   Title decrease pain 50%   Status Achieved     PT LONG TERM GOAL #4   Status Partially Met     PT LONG TERM GOAL #5   Title increase AROM of right shoulder IR to 65 degrees   Status Partially Met               Plan - 10/26/16 1103    Clinical Impression Statement Pt performed all of today's interventions well. Does report a burning sensation with some exercises. Has progressed increasing his R shoulder ROM. R shoulder ER and IR taken in supine. Pt R shoulder does protract with IR.   Rehab Potential Good   PT Frequency 2x / week   PT Duration 8 weeks   PT Treatment/Interventions ADLs/Self Care Home Management;Cryotherapy;Electrical Stimulation;Moist Heat;Therapeutic activities;Therapeutic exercise;Neuromuscular re-education;Patient/family education;Manual techniques;Vasopneumatic Device   PT Next Visit Plan R shoulder strengthening, pt would like to return to pickleball      Patient will benefit from skilled therapeutic intervention in order to improve the following deficits and impairments:  Decreased activity tolerance, Decreased strength, Increased edema, Decreased range of motion, Increased muscle spasms, Impaired flexibility, Improper body mechanics, Postural dysfunction, Pain  Visit Diagnosis: Stiffness of right shoulder, not elsewhere classified  Localized swelling, mass and lump, right upper limb  Acute pain of right shoulder     Problem List Patient Active Problem List   Diagnosis Date Noted  . Abnormal myocardial perfusion  study 10/02/2014  . Tobacco use 04/08/2014  . PAD (peripheral artery disease) ([facility])   . Orthostasis   . CAD (coronary artery disease) of artery bypass graft   . Gout   . Essential hypertension, benign 06/13/2013  . 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 Type II or unspecified type diabetes mellitus without mention of complication, uncontrolled 01/25/2013  . Hyperlipidemia 01/25/2013  Scot Jun, PTA 10/26/2016, 11:05 AM  [address] [place], [place], [address] Phone: [phone]   Fax:  [phone]  Name: Waldemar Siegel MRN: [id number] Date of Birth: 1940-12-13

## 2016-11-02 ENCOUNTER — Ambulatory Visit: Payer: PPO | Admitting: Physical Therapy

## 2016-11-02 ENCOUNTER — Encounter: Payer: Self-pay | Admitting: Physical Therapy

## 2016-11-02 DIAGNOSIS — R2231 Localized swelling, mass and lump, right upper limb: Secondary | ICD-10-CM

## 2016-11-02 DIAGNOSIS — M25511 Pain in right shoulder: Secondary | ICD-10-CM

## 2016-11-02 DIAGNOSIS — M25611 Stiffness of right shoulder, not elsewhere classified: Secondary | ICD-10-CM

## 2016-11-02 DIAGNOSIS — Z4789 Encounter for other orthopedic aftercare: Secondary | ICD-10-CM | POA: Diagnosis not present

## 2016-11-02 DIAGNOSIS — S46011D Strain of muscle(s) and tendon(s) of the rotator cuff of right shoulder, subsequent encounter: Secondary | ICD-10-CM | POA: Diagnosis not present

## 2016-11-02 NOTE — Therapy (Signed)
[facility] [place], [place], [address] Phone: [phone]   Fax:  [phone]  Physical Therapy Treatment  Patient Details  Name: Mario [name] MRN: [id number] Date of Birth: 03-28-1941 Referring Provider: Onnie Graham  Encounter Date: 11/02/2016      PT End of Session - 11/02/16 1517    Visit Number 9   Date for PT Re-Evaluation 10/31/16   PT Start Time 1430   PT Stop Time 1531   PT Time Calculation (min) 61 min   Activity Tolerance Patient tolerated treatment well   Behavior During Therapy [facility] for tasks assessed/performed      Past Medical History:  Diagnosis Date  . Arthritis   . CAD (coronary artery disease)    a.  s/p IMI and BMS 1997;  b. Myoview 4/16:  inf scar with peri-infarct ischemia, EF 34%; high risk;  c. LHC 5/16:  3 v CAD >> CABG (free L-LAD, S-OM, S-dRCA)  . Carotid stenosis    a. carotid US 6/16:  bilat ICA 1-39%  . Chronic kidney disease, stage II (mild)   . Chronic systolic CHF (congestive heart failure) ([place])   . Essential hypertension, benign   . Gout   . HLD (hyperlipidemia)   . Ischemic cardiomyopathy    a. EF by myoview 4/16 34%; b. LHC 5/16: EF 40-45%;  c. intraop TEE 6/16: EF 45-50%  . Orthostasis   . PAD (peripheral artery disease) (HCC)    Dr. Fletcher Anon  . Pneumonia    hx of  . Type II or unspecified type diabetes mellitus without mention of complication, uncontrolled     Past Surgical History:  Procedure Laterality Date  . ABDOMINAL AORTAGRAM N/A 10/09/2013   Procedure: ABDOMINAL Maxcine Ham;  Surgeon: Wellington Hampshire, MD;  Location: [facility];  Service: Cardiovascular;  Laterality: N/A;  . CARDIAC CATHETERIZATION N/A 10/02/2014   Procedure: Left Heart Cath and Coronary Angiography;  Surgeon: Belva Crome, MD;  Location: [facility];  Service: Cardiovascular;  Laterality: N/A;  . CATARACT EXTRACTION    . CHOLECYSTECTOMY    . COLONOSCOPY    . CORONARY ARTERY  BYPASS GRAFT N/A 11/03/2014   Procedure: CORONARY ARTERY BYPASS GRAFTING  times three using left internal mammary and right greater saphenous vein;  Surgeon: Gaye Pollack, MD;  Location: [facility];  Service: Open Heart Surgery;  Laterality: N/A;  . HERNIA REPAIR    . MELANOMA EXCISION    . SKIN LESION EXCISION     multiple  . TEE WITHOUT CARDIOVERSION N/A 11/03/2014   Procedure: TRANSESOPHAGEAL ECHOCARDIOGRAM (TEE);  Surgeon: Gaye Pollack, MD;  Location: [facility];  Service: Open Heart Surgery;  Laterality: N/A;    There were no vitals filed for this visit.      Subjective Assessment - 11/02/16 1432    Subjective "Have been feeling good, The doctor graduated me"   Currently in Pain? No/denies   Pain Score 0-No pain                         [facility] Adult PT Treatment/Exercise - 11/02/16 0001      Shoulder Exercises: Seated   Other Seated Exercises bent over rows 4lb, ext 4lb, rev flys 2lb 2x10     Shoulder Exercises: Standing   Flexion 20 reps;Weights;Both   Theraband Level (Shoulder Flexion) Level 1 (Yellow)   ABduction AROM;Both;20 reps   Shoulder ABduction Weight (lbs) 1  Other Standing Exercises biceps curls 25lb 2x10   Other Standing Exercises 4 level cabinet reaches 2llb flex and abd  x15; wall push ups 2x15     Shoulder Exercises: ROM/Strengthening   UBE (Upper Arm Bike) L 6 3 min forward 3 min backwards     Shoulder Exercises: Power Leisure centre manager & lats 35lb 2x15   Other Power Tower Exercises chest press 10lb 2x10      Modalities   Modalities Vasopneumatic     Vasopneumatic   Number Minutes Vasopneumatic  15 minutes   Vasopnuematic Location  Shoulder   Vasopneumatic Pressure Medium   Vasopneumatic Temperature  32                  PT Short Term Goals - 10/12/16 1056      PT SHORT TERM GOAL #1   Title independent with initial HEP   Status Achieved           PT Long Term Goals - 10/19/16 1347      PT  LONG TERM GOAL #1   Title independent and safe with return to gym   Status Partially Met     PT LONG TERM GOAL #2   Title decrease pain 50%   Status Achieved     PT LONG TERM GOAL #4   Status Partially Met     PT LONG TERM GOAL #5   Title increase AROM of right shoulder IR to 65 degrees   Status Partially Met               Plan - 11/02/16 1518    Clinical Impression Statement Pt continues to progressed well, reports being released form MD to rerun to pickel ball. Pt able to complete all exercises well still demos some weakness with R shoulder flexion and abduction.    Rehab Potential Good   PT Frequency 2x / week   PT Duration 8 weeks   PT Treatment/Interventions ADLs/Self Care Home Management;Cryotherapy;Electrical Stimulation;Moist Heat;Therapeutic activities;Therapeutic exercise;Neuromuscular re-education;Patient/family education;Manual techniques;Vasopneumatic Device   PT Next Visit Plan R shoulder strengthening, pt would like to return to pickleball      Patient will benefit from skilled therapeutic intervention in order to improve the following deficits and impairments:  Decreased activity tolerance, Decreased strength, Increased edema, Decreased range of motion, Increased muscle spasms, Impaired flexibility, Improper body mechanics, Postural dysfunction, Pain  Visit Diagnosis: Stiffness of right shoulder, not elsewhere classified  Localized swelling, mass and lump, right upper limb  Acute pain of right shoulder     Problem List Patient Active Problem List   Diagnosis Date Noted  . Abnormal myocardial perfusion study 10/02/2014  . Tobacco use 04/08/2014  . PAD (peripheral artery disease) ([facility])   . Orthostasis   . CAD (coronary artery disease) of artery bypass graft   . Gout   . Essential hypertension, benign 06/13/2013  . 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 Type II or unspecified type diabetes mellitus without mention of complication,  uncontrolled 01/25/2013  . Hyperlipidemia 01/25/2013    Scot Jun 11/02/2016, 3:19 PM  [address] [place], [place], [address] Phone: [phone]   Fax:  [phone]  Name: Mario [name] MRN: [id number] Date of Birth: 12-May-1941

## 2016-11-08 DIAGNOSIS — N430 Encysted hydrocele: Secondary | ICD-10-CM | POA: Diagnosis not present

## 2016-11-09 ENCOUNTER — Ambulatory Visit: Payer: PPO | Admitting: Physical Therapy

## 2016-11-09 ENCOUNTER — Encounter: Payer: Self-pay | Admitting: Physical Therapy

## 2016-11-09 DIAGNOSIS — R2231 Localized swelling, mass and lump, right upper limb: Secondary | ICD-10-CM

## 2016-11-09 DIAGNOSIS — M25511 Pain in right shoulder: Secondary | ICD-10-CM

## 2016-11-09 DIAGNOSIS — M25611 Stiffness of right shoulder, not elsewhere classified: Secondary | ICD-10-CM | POA: Diagnosis not present

## 2016-11-09 NOTE — Therapy (Signed)
[facility] [address] [place], [place], [address] Phone: [phone]   Fax:  [phone]  Physical Therapy Treatment  Patient Details  Name: Mario [name] MRN: [id number] Date of Birth: [birth year] Referring Provider: Onnie Graham  Encounter Date: 11/09/2016      PT End of Session - 11/09/16 1013    Visit Number 10   Date for PT Re-Evaluation 10/31/16   PT Start Time 0930   PT Stop Time 1013   PT Time Calculation (min) 43 min   Activity Tolerance Patient tolerated treatment well   Behavior During Therapy [facility] for tasks assessed/performed      Past Medical History:  Diagnosis Date  . Arthritis   . CAD (coronary artery disease)    a.  s/p IMI and BMS 1997;  b. Myoview 4/16:  inf scar with peri-infarct ischemia, EF 34%; high risk;  c. LHC 5/16:  3 v CAD >> CABG (free L-LAD, S-OM, S-dRCA)  . Carotid stenosis    a. carotid US 6/16:  bilat ICA 1-39%  . Chronic kidney disease, stage II (mild)   . Chronic systolic CHF (congestive heart failure) ([facility] [facility])   . Essential hypertension, benign   . Gout   . HLD (hyperlipidemia)   . Ischemic cardiomyopathy    a. EF by myoview 4/16 34%; b. LHC 5/16: EF 40-45%;  c. intraop TEE 6/16: EF 45-50%  . Orthostasis   . PAD (peripheral artery disease) (HCC)    Dr. Fletcher Anon  . Pneumonia    hx of  . Type II or unspecified type diabetes mellitus without mention of complication, uncontrolled     Past Surgical History:  Procedure Laterality Date  . ABDOMINAL AORTAGRAM N/A 10/09/2013   Procedure: ABDOMINAL Maxcine Ham;  Surgeon: Wellington Hampshire, MD;  Location: [facility];  Service: Cardiovascular;  Laterality: N/A;  . CARDIAC CATHETERIZATION N/A 10/02/2014   Procedure: Left Heart Cath and Coronary Angiography;  Surgeon: Belva Crome, MD;  Location: [facility];  Service: Cardiovascular;  Laterality: N/A;  . CATARACT EXTRACTION    . CHOLECYSTECTOMY    . COLONOSCOPY    . CORONARY ARTERY  BYPASS GRAFT N/A 11/03/2014   Procedure: CORONARY ARTERY BYPASS GRAFTING  times three using left internal mammary and right greater saphenous vein;  Surgeon: Gaye Pollack, MD;  Location: [facility];  Service: Open Heart Surgery;  Laterality: N/A;  . HERNIA REPAIR    . MELANOMA EXCISION    . SKIN LESION EXCISION     multiple  . TEE WITHOUT CARDIOVERSION N/A 11/03/2014   Procedure: TRANSESOPHAGEAL ECHOCARDIOGRAM (TEE);  Surgeon: Gaye Pollack, MD;  Location: [facility];  Service: Open Heart Surgery;  Laterality: N/A;    There were no vitals filed for this visit.      Subjective Assessment - 11/09/16 0929    Subjective "Doing great" "Back to playing pickle ball two hours Friday and Sunday"   Currently in Pain? No/denies   Pain Score 0-No pain                         [facility] Adult PT Treatment/Exercise - 11/09/16 0001      Shoulder Exercises: Seated   Other Seated Exercises bent over rows 4lb, ext 4lb, rev flys 2lb 2x10     Shoulder Exercises: Standing   Other Standing Exercises biceps curls 25lb, triceps ext 35lb 2x10   Other Standing Exercises 4 level cabinet reaches 2llb  flex and abd  x15; wall push ups 2x15     Shoulder Exercises: ROM/Strengthening   UBE (Upper Arm Bike) L 6 3 min forward 3 min backwards     Shoulder Exercises: Power Leisure centre manager & lats 35lb 2x15   Other Power Tower Exercises chest press 15lb 2x15                  PT Short Term Goals - 10/12/16 1056      PT SHORT TERM GOAL #1   Title independent with initial HEP   Status Achieved           PT Long Term Goals - 11/09/16 1016      PT LONG TERM GOAL #1   Title independent and safe with return to gym   Status Achieved     PT LONG TERM GOAL #2   Title decrease pain 50%               Plan - 11/09/16 1013    Clinical Impression Statement  Pt tolerated all of today's interventions well. Does report a little pian with weighted abduction. Pt reports  that he has return to pickle ball and swimming without issues.    Rehab Potential Good   PT Frequency 2x / week   PT Duration 8 weeks   PT Treatment/Interventions ADLs/Self Care Home Management;Cryotherapy;Electrical Stimulation;Moist Heat;Therapeutic activities;Therapeutic exercise;Neuromuscular re-education;Patient/family education;Manual techniques;Vasopneumatic Device   PT Next Visit Plan Spoke to lead PT will place pt on 2 week hold. pt will call to schedule more appointments if he notices a decline in function. Will D/C in 2 weeks if pt does not call.      Patient will benefit from skilled therapeutic intervention in order to improve the following deficits and impairments:  Decreased activity tolerance, Decreased strength, Increased edema, Decreased range of motion, Increased muscle spasms, Impaired flexibility, Improper body mechanics, Postural dysfunction, Pain  Visit Diagnosis: Stiffness of right shoulder, not elsewhere classified  Acute pain of right shoulder  Localized swelling, mass and lump, right upper limb     Problem List Patient Active Problem List   Diagnosis Date Noted  . Abnormal myocardial perfusion study 10/02/2014  . Tobacco use 04/08/2014  . PAD (peripheral artery disease) ([place])   . Orthostasis   . CAD (coronary artery disease) of artery bypass graft   . Gout   . Essential hypertension, benign 06/13/2013  . 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 Type II or unspecified type diabetes mellitus without mention of complication, uncontrolled 01/25/2013  . Hyperlipidemia 01/25/2013    Scot Jun, PTA 11/09/2016, 10:16 AM  [address] [place], [place], [address] Phone: [phone]   Fax:  [phone]  Name: Mario [name] MRN: [id number] Date of Birth: Jun 08, 1940

## 2016-11-10 ENCOUNTER — Encounter: Payer: Self-pay | Admitting: Vascular Surgery

## 2016-11-15 ENCOUNTER — Ambulatory Visit (INDEPENDENT_AMBULATORY_CARE_PROVIDER_SITE_OTHER): Payer: PPO | Admitting: Ophthalmology

## 2016-11-15 DIAGNOSIS — H353122 Nonexudative age-related macular degeneration, left eye, intermediate dry stage: Secondary | ICD-10-CM

## 2016-11-15 DIAGNOSIS — H35033 Hypertensive retinopathy, bilateral: Secondary | ICD-10-CM | POA: Diagnosis not present

## 2016-11-15 DIAGNOSIS — H43813 Vitreous degeneration, bilateral: Secondary | ICD-10-CM | POA: Diagnosis not present

## 2016-11-15 DIAGNOSIS — H35372 Puckering of macula, left eye: Secondary | ICD-10-CM

## 2016-11-15 DIAGNOSIS — I10 Essential (primary) hypertension: Secondary | ICD-10-CM | POA: Diagnosis not present

## 2016-11-21 ENCOUNTER — Encounter: Payer: Self-pay | Admitting: Vascular Surgery

## 2016-11-22 ENCOUNTER — Ambulatory Visit: Payer: PPO | Admitting: Interventional Cardiology

## 2016-11-25 ENCOUNTER — Encounter: Payer: Self-pay | Admitting: Vascular Surgery

## 2016-11-25 ENCOUNTER — Ambulatory Visit (HOSPITAL_COMMUNITY)
Admission: RE | Admit: 2016-11-25 | Discharge: 2016-11-25 | Disposition: A | Discharge status: Home / Self Care | Payer: PPO | Source: Ambulatory Visit | Attending: Vascular Surgery | Admitting: Vascular Surgery

## 2016-11-25 ENCOUNTER — Ambulatory Visit: Payer: PPO | Admitting: Interventional Cardiology

## 2016-11-25 ENCOUNTER — Ambulatory Visit (INDEPENDENT_AMBULATORY_CARE_PROVIDER_SITE_OTHER): Payer: PPO | Admitting: Vascular Surgery

## 2016-11-25 VITALS — BP 119/73 | HR 76 | Temp 97.0°F | Resp 16 | Ht 72.0 in | Wt 158.0 lb

## 2016-11-25 DIAGNOSIS — I83009 Varicose veins of unspecified lower extremity with ulcer of unspecified site: Secondary | ICD-10-CM | POA: Diagnosis not present

## 2016-11-25 DIAGNOSIS — I739 Peripheral vascular disease, unspecified: Secondary | ICD-10-CM

## 2016-11-25 DIAGNOSIS — L97909 Non-pressure chronic ulcer of unspecified part of unspecified lower leg with unspecified severity: Secondary | ICD-10-CM | POA: Insufficient documentation

## 2016-11-25 NOTE — Progress Notes (Signed)
Patient ID: Mario [name], male   DOB: 02/02/1941, 76 y.o.   MRN: [id number]  Reason for Consult: New Evaluation (Epic referral, Dr. Dwyane Dee.  PVD, non-healing ulcer on R LE.)   Referred by [name], MD  Subjective:     HPI:  Mario [name] is a 76 y.o. male history of coronary artery disease status post stenting and eventual CABG. He is a former smoker having quit in 2016. He is also a long-term diabetic but states he is decently controlled. He has a long history of cramping in his bilateral lower extremities right greater than left with activity but he is still able to play pickle ball with stopping. He states this occurs approximately 2 blocks. He does have an ulcer on his right ankle that is been there for at least several months and has failed to heal. He also has small tissue loss on his anterior knee from a low-grade trauma. He does not have any previous history of DVT. He denies leg swelling. He has not had ulcers like this in the past. Denies fevers chills or other constitutional symptoms. He takes aspirin and a statin drug is allergic to Plavix.  Past Medical History:  Diagnosis Date  . Arthritis   . CAD (coronary artery disease)    a.  s/p IMI and BMS 1997;  b. Myoview 4/16:  inf scar with peri-infarct ischemia, EF 34%; high risk;  c. LHC 5/16:  3 v CAD >> CABG (free L-LAD, S-OM, S-dRCA)  . Carotid stenosis    a. carotid US 6/16:  bilat ICA 1-39%  . Chronic kidney disease, stage II (mild)   . Chronic systolic CHF (congestive heart failure) ([place])   . Essential hypertension, benign   . Gout   . HLD (hyperlipidemia)   . Ischemic cardiomyopathy    a. EF by myoview 4/16 34%; b. LHC 5/16: EF 40-45%;  c. intraop TEE 6/16: EF 45-50%  . Orthostasis   . PAD (peripheral artery disease) (HCC)    Dr. Fletcher Anon  . Pneumonia    hx of  . Type II or unspecified type diabetes mellitus without mention of complication, uncontrolled    Family History  Problem Relation Age of Onset  . Colon  cancer Father   . Heart disease Mother    Past Surgical History:  Procedure Laterality Date  . ABDOMINAL AORTAGRAM N/A 10/09/2013   Procedure: ABDOMINAL Maxcine Ham;  Surgeon: Wellington Hampshire, MD;  Location: [facility];  Service: Cardiovascular;  Laterality: N/A;  . CARDIAC CATHETERIZATION N/A 10/02/2014   Procedure: Left Heart Cath and Coronary Angiography;  Surgeon: Belva Crome, MD;  Location: [facility];  Service: Cardiovascular;  Laterality: N/A;  . CATARACT EXTRACTION    . CHOLECYSTECTOMY    . COLONOSCOPY    . CORONARY ARTERY BYPASS GRAFT N/A 11/03/2014   Procedure: CORONARY ARTERY BYPASS GRAFTING  times three using left internal mammary and right greater saphenous vein;  Surgeon: Gaye Pollack, MD;  Location: [facility];  Service: Open Heart Surgery;  Laterality: N/A;  . HERNIA REPAIR    . MELANOMA EXCISION    . SKIN LESION EXCISION     multiple  . TEE WITHOUT CARDIOVERSION N/A 11/03/2014   Procedure: TRANSESOPHAGEAL ECHOCARDIOGRAM (TEE);  Surgeon: Gaye Pollack, MD;  Location: [facility];  Service: Open Heart Surgery;  Laterality: N/A;    Short Social History:  Social History  Substance Use Topics  . Smoking status: Former Smoker    Packs/day: 0.50  Years: 50.00    Types: Cigarettes    Quit date: 10/22/2014  . Smokeless tobacco: Never Used  . Alcohol use No    Allergies  Allergen Reactions  . Penicillins Anaphylaxis  . Plavix [Clopidogrel Bisulfate] Hives  . Pletal [Cilostazol] Itching    Current Outpatient Prescriptions  Medication Sig Dispense Refill  . allopurinol (ZYLOPRIM) 100 MG tablet Take 1 tablet (100 mg total) by mouth daily. 90 tablet 1  . aspirin EC 325 MG EC tablet Take 1 tablet (325 mg total) by mouth daily. 30 tablet 0  . atorvastatin (LIPITOR) 40 MG tablet TAKE 1 TABLET EVERY DAY AT  6 PM 90 tablet 1  . Blood Glucose Monitoring Suppl (FREESTYLE FREEDOM LITE) w/Device KIT USE AS DIRECTED TO  CHECK  BLOOD  SUGAR  TWICE  DAILY 1 each 1  . Bromocriptine  Mesylate 0.8 MG TABS Take 4 mg by mouth daily.     . carvedilol (COREG) 3.125 MG tablet Take 1 tablet (3.125 mg total) by mouth 2 (two) times daily. 60 tablet 2  . cholecalciferol (VITAMIN D) 1000 UNITS tablet Take 2,000 Units by mouth daily.    Marland Kitchen Co-Enzyme Q-10 100 MG CAPS Take 100 mg by mouth daily.    . finasteride (PROSCAR) 5 MG tablet Take 5 mg by mouth daily.  3  . folic acid (FOLVITE) 814 MCG tablet Take 800 mcg by mouth daily.    Marland Kitchen glipiZIDE (GLUCOTROL XL) 10 MG 24 hr tablet Take 1 tablet (10 mg total) by mouth at bedtime. 90 tablet 1  . glucose blood (FREESTYLE LITE) test strip Use as instructed to check blood sugar 2 times per day dx code E11.65 100 each 3  . Lancets (FREESTYLE) lancets Use as instructed to check blood sugar 2 times per day dx code E11.65 100 each 3  . lisinopril (PRINIVIL,ZESTRIL) 5 MG tablet Take 1 tablet (5 mg total) by mouth daily. 30 tablet 2  . metFORMIN (GLUCOPHAGE) 1000 MG tablet Take 1 tablet (1,000 mg total) by mouth 2 (two) times daily with a meal. 180 tablet 3  . miglitol (GLYSET) 50 MG tablet Take 1 tablet (50 mg total) by mouth 3 (three) times daily with meals. 270 tablet 1  . Multiple Vitamins-Minerals (PRESERVISION AREDS 2) CAPS Take 2 capsules by mouth daily. Reported on 09/18/2015    . omega-3 acid ethyl esters (LOVAZA) 1 G capsule Take 1 g by mouth daily.    . tamsulosin (FLOMAX) 0.4 MG CAPS capsule Take 1 capsule (0.4 mg total) by mouth daily. 30 capsule 0  . VICTOZA 18 MG/3ML SOPN Inject 0.2 mLs (1.2 mg total) into the skin daily. Inject once daily at the same time 18 mL 1  . vitamin B-12 (CYANOCOBALAMIN) 500 MCG tablet Take 1,000 mcg by mouth daily.    . vitamin E 100 UNIT capsule Take 100 Units by mouth daily.     No current facility-administered medications for this visit.     Review of Systems  Constitutional:  Constitutional negative. Respiratory: Positive for cough.  Cardiovascular: Positive for claudication.  GI: Gastrointestinal  negative.  Musculoskeletal: Musculoskeletal negative.  Skin: Positive for wound.  Neurological: Neurological negative. Hematologic: Hematologic/lymphatic negative.        Objective:  Objective   Vitals:   11/25/16 1353  BP: 119/73  Pulse: 76  Resp: 16  Temp: (!) 97 F (36.1 C)  TempSrc: Oral  SpO2: 100%  Weight: 158 lb (71.7 kg)  Height: 6' (1.829 m)   Body  mass index is 21.43 kg/m.  Physical Exam  Constitutional: He is oriented to person, place, and time. He appears well-developed.  HENT:  Head: Normocephalic.  Eyes: Pupils are equal, round, and reactive to light.  Neck: Normal range of motion.  Cardiovascular: Normal rate.   Pulses:      Femoral pulses are 2+ on the right side, and 2+ on the left side. Monophasic dp/pt  Pulmonary/Chest: Effort normal.  Abdominal: Soft. He exhibits no mass.  Musculoskeletal: He exhibits no edema.  Neurological: He is alert and oriented to person, place, and time.  Skin:  Right medial malleolar ulceration 1.5cm in diameter, no erythema    Data: I have independently interpreted his ABIs to have bilateral monophasic signals with noncompressible vessels. Right digital pressure 64 left digital pressure 35.     Assessment/Plan:     76 year old male here with bilateral lower extremity claudication as well as right medial malleolar ulcer. He has not had a history of venous disease although this does appear to be possibly venous in nature. Given his significant history of coronary artery disease as well as smoking and diabetes and it is prudent to first start with angiogram bilateral lower extremity runoff possible intervention on the right. He is already on aspirin and statin would need consideration of [place] to if we intervene given his Plavix allergy. There is nothing to intervene he will need evaluation of his veins on the right for possible reflux although he has had harvesting of his right greater saphenous vein. We discussed risks  benefits alternatives to procedure he demonstrated good understanding and agrees to proceed.     Waynetta Sandy MD [facility]

## 2016-12-01 ENCOUNTER — Other Ambulatory Visit: Payer: Self-pay

## 2016-12-06 DIAGNOSIS — N452 Orchitis: Secondary | ICD-10-CM | POA: Diagnosis not present

## 2016-12-06 DIAGNOSIS — N430 Encysted hydrocele: Secondary | ICD-10-CM | POA: Diagnosis not present

## 2016-12-07 DIAGNOSIS — D0439 Carcinoma in situ of skin of other parts of face: Secondary | ICD-10-CM | POA: Diagnosis not present

## 2016-12-07 DIAGNOSIS — C44612 Basal cell carcinoma of skin of right upper limb, including shoulder: Secondary | ICD-10-CM | POA: Diagnosis not present

## 2016-12-07 DIAGNOSIS — C44519 Basal cell carcinoma of skin of other part of trunk: Secondary | ICD-10-CM | POA: Diagnosis not present

## 2016-12-07 DIAGNOSIS — C44529 Squamous cell carcinoma of skin of other part of trunk: Secondary | ICD-10-CM | POA: Diagnosis not present

## 2016-12-07 DIAGNOSIS — C44622 Squamous cell carcinoma of skin of right upper limb, including shoulder: Secondary | ICD-10-CM | POA: Diagnosis not present

## 2016-12-07 DIAGNOSIS — D045 Carcinoma in situ of skin of trunk: Secondary | ICD-10-CM | POA: Diagnosis not present

## 2016-12-08 ENCOUNTER — Other Ambulatory Visit: Payer: Self-pay | Admitting: Endocrinology

## 2016-12-10 ENCOUNTER — Other Ambulatory Visit: Payer: Self-pay | Admitting: Endocrinology

## 2016-12-12 DIAGNOSIS — N401 Enlarged prostate with lower urinary tract symptoms: Secondary | ICD-10-CM | POA: Diagnosis not present

## 2016-12-12 DIAGNOSIS — R351 Nocturia: Secondary | ICD-10-CM | POA: Diagnosis not present

## 2016-12-12 DIAGNOSIS — N5201 Erectile dysfunction due to arterial insufficiency: Secondary | ICD-10-CM | POA: Diagnosis not present

## 2016-12-27 ENCOUNTER — Other Ambulatory Visit: Payer: Self-pay | Admitting: Endocrinology

## 2016-12-27 NOTE — Telephone Encounter (Signed)
Patient says Miglitol(glyset) 50mg  tab was supposed to be called in to [facility] and it's not there.   Please advise.  -LL

## 2016-12-28 ENCOUNTER — Ambulatory Visit (HOSPITAL_COMMUNITY)
Admission: RE | Admit: 2016-12-28 | Discharge: 2016-12-28 | Disposition: A | Discharge status: Home / Self Care | Payer: PPO | Source: Ambulatory Visit | Attending: Vascular Surgery | Admitting: Vascular Surgery

## 2016-12-28 ENCOUNTER — Other Ambulatory Visit: Payer: Self-pay | Admitting: *Deleted

## 2016-12-28 ENCOUNTER — Telehealth: Payer: Self-pay | Admitting: Vascular Surgery

## 2016-12-28 ENCOUNTER — Encounter (HOSPITAL_COMMUNITY)
Admission: RE | Disposition: A | Discharge status: Home / Self Care | Payer: Self-pay | Source: Ambulatory Visit | Attending: Vascular Surgery

## 2016-12-28 DIAGNOSIS — N182 Chronic kidney disease, stage 2 (mild): Secondary | ICD-10-CM | POA: Insufficient documentation

## 2016-12-28 DIAGNOSIS — M109 Gout, unspecified: Secondary | ICD-10-CM | POA: Insufficient documentation

## 2016-12-28 DIAGNOSIS — Z7982 Long term (current) use of aspirin: Secondary | ICD-10-CM | POA: Insufficient documentation

## 2016-12-28 DIAGNOSIS — I7025 Atherosclerosis of native arteries of other extremities with ulceration: Secondary | ICD-10-CM

## 2016-12-28 DIAGNOSIS — I255 Ischemic cardiomyopathy: Secondary | ICD-10-CM | POA: Insufficient documentation

## 2016-12-28 DIAGNOSIS — Z880 Allergy status to penicillin: Secondary | ICD-10-CM | POA: Insufficient documentation

## 2016-12-28 DIAGNOSIS — Z7984 Long term (current) use of oral hypoglycemic drugs: Secondary | ICD-10-CM | POA: Insufficient documentation

## 2016-12-28 DIAGNOSIS — M1990 Unspecified osteoarthritis, unspecified site: Secondary | ICD-10-CM | POA: Insufficient documentation

## 2016-12-28 DIAGNOSIS — I130 Hypertensive heart and chronic kidney disease with heart failure and stage 1 through stage 4 chronic kidney disease, or unspecified chronic kidney disease: Secondary | ICD-10-CM | POA: Diagnosis not present

## 2016-12-28 DIAGNOSIS — E1151 Type 2 diabetes mellitus with diabetic peripheral angiopathy without gangrene: Secondary | ICD-10-CM | POA: Insufficient documentation

## 2016-12-28 DIAGNOSIS — I2510 Atherosclerotic heart disease of native coronary artery without angina pectoris: Secondary | ICD-10-CM | POA: Insufficient documentation

## 2016-12-28 DIAGNOSIS — I70233 Atherosclerosis of native arteries of right leg with ulceration of ankle: Secondary | ICD-10-CM | POA: Diagnosis not present

## 2016-12-28 DIAGNOSIS — I700 Atherosclerosis of aorta: Secondary | ICD-10-CM | POA: Diagnosis not present

## 2016-12-28 DIAGNOSIS — I5022 Chronic systolic (congestive) heart failure: Secondary | ICD-10-CM | POA: Insufficient documentation

## 2016-12-28 DIAGNOSIS — Z955 Presence of coronary angioplasty implant and graft: Secondary | ICD-10-CM | POA: Insufficient documentation

## 2016-12-28 DIAGNOSIS — E1122 Type 2 diabetes mellitus with diabetic chronic kidney disease: Secondary | ICD-10-CM | POA: Diagnosis not present

## 2016-12-28 DIAGNOSIS — I70212 Atherosclerosis of native arteries of extremities with intermittent claudication, left leg: Secondary | ICD-10-CM | POA: Diagnosis not present

## 2016-12-28 DIAGNOSIS — I6523 Occlusion and stenosis of bilateral carotid arteries: Secondary | ICD-10-CM | POA: Diagnosis not present

## 2016-12-28 DIAGNOSIS — I70234 Atherosclerosis of native arteries of right leg with ulceration of heel and midfoot: Secondary | ICD-10-CM | POA: Diagnosis not present

## 2016-12-28 DIAGNOSIS — L97319 Non-pressure chronic ulcer of right ankle with unspecified severity: Secondary | ICD-10-CM | POA: Diagnosis not present

## 2016-12-28 DIAGNOSIS — E785 Hyperlipidemia, unspecified: Secondary | ICD-10-CM | POA: Diagnosis not present

## 2016-12-28 DIAGNOSIS — Z951 Presence of aortocoronary bypass graft: Secondary | ICD-10-CM | POA: Diagnosis not present

## 2016-12-28 DIAGNOSIS — Z87891 Personal history of nicotine dependence: Secondary | ICD-10-CM | POA: Insufficient documentation

## 2016-12-28 HISTORY — PX: LOWER EXTREMITY ANGIOGRAPHY: CATH118251

## 2016-12-28 HISTORY — PX: PERIPHERAL VASCULAR BALLOON ANGIOPLASTY: CATH118281

## 2016-12-28 HISTORY — PX: ABDOMINAL AORTOGRAM: CATH118222

## 2016-12-28 LAB — POCT I-STAT, CHEM 8
BUN: 30 mg/dL — ABNORMAL HIGH (ref 6–20)
Calcium, Ion: 1.38 mmol/L (ref 1.15–1.40)
Chloride: 109 mmol/L (ref 101–111)
Creatinine, Ser: 1.2 mg/dL (ref 0.61–1.24)
Glucose, Bld: 164 mg/dL — ABNORMAL HIGH (ref 65–99)
HCT: 30 % — ABNORMAL LOW (ref 39.0–52.0)
Hemoglobin: 10.2 g/dL — ABNORMAL LOW (ref 13.0–17.0)
Potassium: 4.6 mmol/L (ref 3.5–5.1)
Sodium: 141 mmol/L (ref 135–145)
TCO2: 21 mmol/L (ref 0–100)

## 2016-12-28 LAB — POCT ACTIVATED CLOTTING TIME: Activated Clotting Time: 180 seconds

## 2016-12-28 SURGERY — ABDOMINAL AORTOGRAM
Anesthesia: LOCAL | Laterality: Right

## 2016-12-28 MED ORDER — SODIUM CHLORIDE 0.9 % IV SOLN: 1.0000 mL/kg/h | INTRAVENOUS | Status: DC

## 2016-12-28 MED ORDER — OXYCODONE-ACETAMINOPHEN 5-325 MG PO TABS: 1.0000 | ORAL_TABLET | ORAL | Status: DC | PRN

## 2016-12-28 MED ORDER — SODIUM CHLORIDE 0.9 % IV SOLN
INTRAVENOUS | Status: DC
  Administered 2016-12-28: 09:00:00 via INTRAVENOUS

## 2016-12-28 MED ORDER — MORPHINE SULFATE (PF) 4 MG/ML IV SOLN: 2.0000 mg | INTRAVENOUS | Status: DC | PRN

## 2016-12-28 MED ORDER — HEPARIN (PORCINE) IN NACL 2-0.9 UNIT/ML-% IJ SOLN
INTRAMUSCULAR | Status: AC | PRN
  Administered 2016-12-28: 1000 mL

## 2016-12-28 MED ORDER — HEPARIN SODIUM (PORCINE) 1000 UNIT/ML IJ SOLN
INTRAMUSCULAR | Status: AC
  Filled 2016-12-28: qty 1

## 2016-12-28 MED ORDER — HEPARIN SODIUM (PORCINE) 1000 UNIT/ML IJ SOLN
INTRAMUSCULAR | Status: DC | PRN
  Administered 2016-12-28: 8000 [IU] via INTRAVENOUS

## 2016-12-28 MED ORDER — HEPARIN (PORCINE) IN NACL 2-0.9 UNIT/ML-% IJ SOLN
INTRAMUSCULAR | Status: AC
  Filled 2016-12-28: qty 1000

## 2016-12-28 MED ORDER — LIDOCAINE HCL (PF) 1 % IJ SOLN
INTRAMUSCULAR | Status: AC
  Filled 2016-12-28: qty 30

## 2016-12-28 MED ORDER — IODIXANOL 320 MG/ML IV SOLN
INTRAVENOUS | Status: DC | PRN
  Administered 2016-12-28: 100 mL via INTRAVENOUS

## 2016-12-28 MED ORDER — LIDOCAINE HCL (PF) 1 % IJ SOLN
INTRAMUSCULAR | Status: DC | PRN
  Administered 2016-12-28: 22 mL

## 2016-12-28 SURGICAL SUPPLY — 18 items
CATH ANGIO 5F BER2 65CM (CATHETERS) ×4 IMPLANT
CATH CXI SUPP ANG 2.6FR 150CM (MICROCATHETER) ×4 IMPLANT
CATH OMNI FLUSH 5F 65CM (CATHETERS) ×4 IMPLANT
CATH QUICKCROSS SUPP .035X90CM (MICROCATHETER) ×4 IMPLANT
COVER PRB 48X5XTLSCP FOLD TPE (BAG) ×3 IMPLANT
COVER PROBE 5X48 (BAG) ×1
DEVICE TORQUE .025-.038 (MISCELLANEOUS) ×4 IMPLANT
GUIDEWIRE ANGLED .035X260CM (WIRE) ×4 IMPLANT
KIT MICROINTRODUCER STIFF 5F (SHEATH) ×4 IMPLANT
KIT PV (KITS) ×4 IMPLANT
SHEATH HIGHFLEX ANSEL 7FR 55CM (SHEATH) ×4 IMPLANT
SHEATH PINNACLE 5F 10CM (SHEATH) ×4 IMPLANT
SYR MEDRAD MARK V 150ML (SYRINGE) ×4 IMPLANT
TRANSDUCER W/STOPCOCK (MISCELLANEOUS) ×4 IMPLANT
TRAY PV CATH (CUSTOM PROCEDURE TRAY) ×4 IMPLANT
WIRE BENTSON .035X145CM (WIRE) ×4 IMPLANT
WIRE G V18X300CM (WIRE) ×4 IMPLANT
WIRE ROSEN-J .035X260CM (WIRE) ×4 IMPLANT

## 2016-12-28 NOTE — Telephone Encounter (Signed)
Sched wound care appt 01/09/17 at 1:45 with Dr. Dellia Nims at the [facility]. Sched lab 01/20/17 at 3:30 and MD 01/27/17 at 9:00. Lm on hm#.

## 2016-12-28 NOTE — H&P (Signed)
Patient ID: Mario [name], male   DOB: 1941-01-04, 76 y.o.   MRN: [id number]  Reason for Consult: New Evaluation (Epic referral, Dr. Dwyane Dee.  PVD, non-healing ulcer on R LE.)   Referred by [name], MD  Subjective:     HPI:  Mario [name] is a 76 y.o. male history of coronary artery disease status post stenting and eventual CABG. He is a former smoker having quit in 2016. He is also a long-term diabetic but states he is decently controlled. He has a long history of cramping in his bilateral lower extremities right greater than left with activity but he is still able to play pickle ball with stopping. He states this occurs approximately 2 blocks. He does have an ulcer on his right ankle that is been there for at least several months and has failed to heal. He also has small tissue loss on his anterior knee from a low-grade trauma. He does not have any previous history of DVT. He denies leg swelling. He has not had ulcers like this in the past. Denies fevers chills or other constitutional symptoms. He takes aspirin and a statin drug is allergic to Plavix.  Past Medical History:  Diagnosis Date  . Arthritis   . CAD (coronary artery disease)    a.  s/p IMI and BMS 1997;  b. Myoview 4/16:  inf scar with peri-infarct ischemia, EF 34%; high risk;  [name]. LHC 5/16:  3 v CAD >> CABG (free L-LAD, S-OM, S-dRCA)  . Carotid stenosis    a. carotid US 6/16:  bilat ICA 1-39%  . Chronic kidney disease, stage II (mild)   . Chronic systolic CHF (congestive heart failure) ([place])   . Essential hypertension, benign   . Gout   . HLD (hyperlipidemia)   . Ischemic cardiomyopathy    a. EF by myoview 4/16 34%; b. LHC 5/16: EF 40-45%;  [name]. intraop TEE 6/16: EF 45-50%  . Orthostasis   . PAD (peripheral artery disease) (HCC)    Dr. Fletcher Anon  . Pneumonia    hx of  . Type II or unspecified type diabetes mellitus without mention of complication, uncontrolled         Family History  Problem  Relation Age of Onset  . Colon cancer Father   . Heart disease Mother         Past Surgical History:  Procedure Laterality Date  . ABDOMINAL AORTAGRAM N/A 10/09/2013   Procedure: ABDOMINAL Maxcine Ham;  Surgeon: Wellington Hampshire, MD;  Location: [facility] C[facility];  Service: Cardiovascular;  Laterality: N/A;  . CARDIAC CATHETERIZATION N/A 10/02/2014   Procedure: Left Heart Cath and Coronary Angiography;  Surgeon: Belva Crome, MD;  Location: [facility] CV [facility];  Service: Cardiovascular;  Laterality: N/A;  . CATARACT EXTRACTION    . CHOLECYSTECTOMY    . COLONOSCOPY    . CORONARY ARTERY BYPASS GRAFT N/A 11/03/2014   Procedure: CORONARY ARTERY BYPASS GRAFTING  times three using left internal mammary and right greater saphenous vein;  Surgeon: Gaye Pollack, MD;  Location: [facility];  Service: Open Heart Surgery;  Laterality: N/A;  . HERNIA REPAIR    . MELANOMA EXCISION    . SKIN LESION EXCISION     multiple  . TEE WITHOUT CARDIOVERSION N/A 11/03/2014   Procedure: TRANSESOPHAGEAL ECHOCARDIOGRAM (TEE);  Surgeon: Gaye Pollack, MD;  Location: [place] [facility];  Service: Open Heart Surgery;  Laterality: N/A;    Short Social History:       Social History  Substance Use Topics  . Smoking status: Former Smoker    Packs/day: 0.50    Years: 50.00    Types: Cigarettes    Quit date: 10/22/2014  . Smokeless tobacco: Never Used  . Alcohol use No        Allergies  Allergen Reactions  . Penicillins Anaphylaxis  . Plavix [Clopidogrel Bisulfate] Hives  . Pletal [Cilostazol] Itching          Current Outpatient Prescriptions  Medication Sig Dispense Refill  . allopurinol (ZYLOPRIM) 100 MG tablet Take 1 tablet (100 mg total) by mouth daily. 90 tablet 1  . aspirin EC 325 MG EC tablet Take 1 tablet (325 mg total) by mouth daily. 30 tablet 0  . atorvastatin (LIPITOR) 40 MG tablet TAKE 1 TABLET EVERY DAY AT  6 PM 90 tablet 1  . Blood Glucose Monitoring Suppl (FREESTYLE FREEDOM LITE)  w/Device KIT USE AS DIRECTED TO  CHECK  BLOOD  SUGAR  TWICE  DAILY 1 each 1  . Bromocriptine Mesylate 0.8 MG TABS Take 4 mg by mouth daily.     . carvedilol (COREG) 3.125 MG tablet Take 1 tablet (3.125 mg total) by mouth 2 (two) times daily. 60 tablet 2  . cholecalciferol (VITAMIN D) 1000 UNITS tablet Take 2,000 Units by mouth daily.    Marland Kitchen Co-Enzyme Q-10 100 MG CAPS Take 100 mg by mouth daily.    . finasteride (PROSCAR) 5 MG tablet Take 5 mg by mouth daily.  3  . folic acid (FOLVITE) 160 MCG tablet Take 800 mcg by mouth daily.    Marland Kitchen glipiZIDE (GLUCOTROL XL) 10 MG 24 hr tablet Take 1 tablet (10 mg total) by mouth at bedtime. 90 tablet 1  . glucose blood (FREESTYLE LITE) test strip Use as instructed to check blood sugar 2 times per day dx code E11.65 100 each 3  . Lancets (FREESTYLE) lancets Use as instructed to check blood sugar 2 times per day dx code E11.65 100 each 3  . lisinopril (PRINIVIL,ZESTRIL) 5 MG tablet Take 1 tablet (5 mg total) by mouth daily. 30 tablet 2  . metFORMIN (GLUCOPHAGE) 1000 MG tablet Take 1 tablet (1,000 mg total) by mouth 2 (two) times daily with a meal. 180 tablet 3  . miglitol (GLYSET) 50 MG tablet Take 1 tablet (50 mg total) by mouth 3 (three) times daily with meals. 270 tablet 1  . Multiple Vitamins-Minerals (PRESERVISION AREDS 2) CAPS Take 2 capsules by mouth daily. Reported on 09/18/2015    . omega-3 acid ethyl esters (LOVAZA) 1 G capsule Take 1 g by mouth daily.    . tamsulosin (FLOMAX) 0.4 MG CAPS capsule Take 1 capsule (0.4 mg total) by mouth daily. 30 capsule 0  . VICTOZA 18 MG/3ML SOPN Inject 0.2 mLs (1.2 mg total) into the skin daily. Inject once daily at the same time 18 mL 1  . vitamin B-12 (CYANOCOBALAMIN) 500 MCG tablet Take 1,000 mcg by mouth daily.    . vitamin E 100 UNIT capsule Take 100 Units by mouth daily.     No current facility-administered medications for this visit.     Review of Systems  Constitutional:  Constitutional  negative. Respiratory: Positive for cough.  Cardiovascular: Positive for claudication.  GI: Gastrointestinal negative.  Musculoskeletal: Musculoskeletal negative.  Skin: Positive for wound.  Neurological: Neurological negative. Hematologic: Hematologic/lymphatic negative.        Objective:  Objective      Vitals:   11/25/16 1353  BP: 119/73  Pulse: 76  Resp: 16  Temp: (!) 97 F (36.1 [name])  TempSrc: Oral  SpO2: 100%  Weight: 158 lb (71.7 kg)  Height: 6' (1.829 m)   Body mass index is 21.43 kg/m.  Physical Exam  Constitutional: He is oriented to person, place, and time. He appears well-developed.  HENT:  Head: Normocephalic.  Eyes: Pupils are equal, round, and reactive to light.  Neck: Normal range of motion.  Cardiovascular: Normal rate.   Pulses:      Femoral pulses are 2+ on the right side, and 2+ on the left side. Monophasic dp/pt  Pulmonary/Chest: Effort normal.  Abdominal: Soft. He exhibits no mass.  Musculoskeletal: He exhibits no edema.  Neurological: He is alert and oriented to person, place, and time.  Skin:  Right medial malleolar ulceration 1.5cm in diameter, no erythema    Data: I have independently interpreted his ABIs to have bilateral monophasic signals with noncompressible vessels. Right digital pressure 64 left digital pressure 35.     Assessment/Plan:   76 year old male here with bilateral lower extremity claudication as well as right medial malleolar ulcer. He has not had a history of venous disease although this does appear to be possibly venous in nature. Given his significant history of coronary artery disease as well as smoking and diabetes and it is prudent to first start with angiogram bilateral lower extremity runoff possible intervention on the right. He is already on aspirin and statin would need consideration of [place]  if we intervene given his Plavix allergy. There is nothing to intervene he will need evaluation of his veins  on the right for possible reflux although he has had harvesting of his right greater saphenous vein. We discussed risks benefits alternatives to procedure he demonstrated good understanding and agrees to proceed.   Mario [name]. [name], MD [facility] [place] Office: [phone] Pager: [phone]

## 2016-12-28 NOTE — Telephone Encounter (Signed)
-----   Message from Mario Goes, Mario [name] sent at 12/28/2016 11:57 AM EDT ----- Regarding: 3-4 weeks appt to discuss surgery, needs saph vein mapping   ----- Message ----- From: Mario Sandy, Mario [name] Sent: 12/28/2016  11:52 AM To: [facility] Charge [address]  [place] [id number] [birth year]  12/28/2016 Pre-operative Diagnosis: critical right lower extremity ischemia with wound Post-operative diagnosis:  Same Surgeon:  Erlene Quan C. [name], Mario [name] Procedure Performed: 1.  US guided cannulation of left common femoral artery 2.  Aortogram with bilateral lower extremity runoff 3.  Selection of Right popliteal artery  F/u in 3-4 weeks with saphenous vein mapping for possible surgery.

## 2016-12-28 NOTE — Telephone Encounter (Signed)
order placed for Ref to wound care; I called Dr. Tamala Julian already  Received: Today  Message Contents  McChesney, Tania Ade, RN  P [facility] Admin Pool      Previous Messages    ----- Message -----  From: Waynetta Sandy, MD  Sent: 12/28/2016 12:28 PM  To: [facility] Charge Pool   Can we also refer Mr. Kelsay to the wound care center and notify Dr. Thompson Caul office of possible bypass surgery as he is seeing him this month.    brandon

## 2016-12-28 NOTE — Progress Notes (Signed)
EPIC down. See paper charting downtime form attatched to pt chart

## 2016-12-28 NOTE — Discharge Instructions (Signed)

## 2016-12-28 NOTE — Op Note (Signed)
    Patient name: Mario [name] MRN: [id number] DOB: [birth year] Sex: male  12/28/2016 Pre-operative Diagnosis: critical right lower extremity ischemia with wound Post-operative diagnosis:  Same Surgeon:  Erlene Quan C. [name], MD Procedure Performed: 1.  US guided cannulation of left common femoral artery 2.  Aortogram with bilateral lower extremity runoff 3.  Selection of Right popliteal artery  Indications:  76 year old male with a history of a right dorsal ankle/malleolar wound that has failed to heal. He does have a long history of smoking and diabetes and has monophasic signals at his ankles consistent with peripheral arterial disease. He does have palpable femoral pulses bilaterally. He possibly has venous disease but is also likely had a saphenous veins harvested for coronary bypass grafting. He is indicated for aortogram with possible intervention of his right lower extremity.  Findings: The aorta iliac and common femoral arteries are heavily calcified bilaterally. The left common femoral artery has a subtotal occlusion in the right common femoral artery has at least 50% stenosis. The right lower extremity the SFA occludes at its extreme distal point in the adductor canal and reconstitutes a popliteal artery at the knee with dominant runoff to the foot via the anterior tibial artery. The left lower extremity has occlusion of the popliteal artery that a short segment and reconstituted at the knee with also dominant runoff via the anterior tibial artery. We were unable to cross the heavily calcified area in the right popliteal artery and no intervention was undertaken.   Procedure:  The patient was identified in the holding area and taken to room 8.  The patient was then placed supine on the table and prepped and draped in the usual sterile fashion.  A time out was called.  Ultrasound was used to evaluate the left common femoral artery.  It was patent .  A digital ultrasound image was acquired.  A  micropuncture needle was used to access the left common femoral artery under ultrasound guidance.  An 018 wire was advanced without resistance and a micropuncture sheath was placed.  The 018 wire was removed and a benson wire was placed.  The micropuncture sheath was exchanged for a 5 french sheath.  An omniflush catheter was advanced over the wire to the level of L-1.  An abdominal angiogram was obtained.  Then performed bilateral lower extremity runoff with the above findings. Using Glidewire and Omni catheter we were able to cross the bifurcation and placed a wire into the SFA. We exchanged for a stiff Rosen wire and then placed a long 7 French sheath into the SFA and the patient was heparinized with 8000 units of heparin. We then used Glidewire and quick cross catheter to get the level of the popliteal artery but could not advance any further. We then selected a the 18 wire with CXI catheter and also could not pass. Given the long segment of occlusion and significant calcifications I determined that this was a lesion we could not cross. The sheath was extracted into the external iliac artery wires and catheters were removed. He tolerated the procedure without immediate complication.  Patient will need consideration for bypass surgery if wound cannot heal on its own.    Contrast: 100cc   Brandon C. [name], MD [facility] [place] Office: [phone] Pager: [phone]

## 2016-12-29 ENCOUNTER — Encounter (HOSPITAL_COMMUNITY): Payer: Self-pay | Admitting: Vascular Surgery

## 2016-12-30 ENCOUNTER — Other Ambulatory Visit (INDEPENDENT_AMBULATORY_CARE_PROVIDER_SITE_OTHER): Payer: PPO

## 2016-12-30 DIAGNOSIS — E1165 Type 2 diabetes mellitus with hyperglycemia: Secondary | ICD-10-CM

## 2016-12-30 LAB — COMPREHENSIVE METABOLIC PANEL
ALT: 40 U/L (ref 0–53)
AST: 24 U/L (ref 0–37)
Albumin: 3.9 g/dL (ref 3.5–5.2)
Alkaline Phosphatase: 58 U/L (ref 39–117)
BUN: 32 mg/dL — ABNORMAL HIGH (ref 6–23)
CO2: 22 mEq/L (ref 19–32)
Calcium: 8.9 mg/dL (ref 8.4–10.5)
Chloride: 112 mEq/L (ref 96–112)
Creatinine, Ser: 1.38 mg/dL (ref 0.40–1.50)
GFR: 53.29 mL/min — ABNORMAL LOW (ref >60.00)
Glucose, Bld: 228 mg/dL — ABNORMAL HIGH (ref 70–99)
Potassium: 4.5 mEq/L (ref 3.5–5.1)
Sodium: 138 mEq/L (ref 135–145)
Total Bilirubin: 1.2 mg/dL (ref 0.2–1.2)
Total Protein: 5.9 g/dL — ABNORMAL LOW (ref 6.0–8.3)

## 2016-12-30 LAB — LIPID PANEL
Cholesterol: 93 mg/dL (ref 0–200)
HDL: 35.1 mg/dL — ABNORMAL LOW (ref >39.00)
LDL Cholesterol: 38 mg/dL (ref 0–99)
NonHDL: 57.69
Total CHOL/HDL Ratio: 3
Triglycerides: 100 mg/dL (ref 0.0–149.0)
VLDL: 20 mg/dL (ref 0.0–40.0)

## 2016-12-30 LAB — HEMOGLOBIN A1C: Hgb A1c MFr Bld: 7.5 % — ABNORMAL HIGH (ref 4.6–6.5)

## 2016-12-30 NOTE — Telephone Encounter (Signed)
Routing to you °

## 2016-12-30 NOTE — Telephone Encounter (Signed)
This was ordered 12/27/16 by Dr. Dwyane Dee

## 2017-01-01 NOTE — Progress Notes (Signed)
Cardiology Office Note    Date:  01/02/2017   ID:  Mario [name], DOB Mar 18, 1941, MRN [id number]  PCP:  Mario Carol, MD  Cardiologist: Mario Grooms, MD   Chief Complaint  Patient presents with  . Coronary Artery Disease  . Claudication    Nonhealing ulcer left lower extremity    History of Present Illness:  Mario [name] is a 76 y.o. male who presents for follow-up coronary artery disease, recent coronary bypass grafting 2016, peripheral arterial disease, hyperlipidemia, diabetes mellitus, essential hypertension, and combined systolic and diastolic heart failure.  Mario [name] is having no cardiac complaints. He specifically denies chest discomfort, dyspnea, orthopnea, and edema. He does have a right ankle nonhealing skin ulcer. For this he has seen Dr. Donzetta [name]. He has bilateral lower extremity claudication. He has diffuse lower extremity arterial obstructive disease. Anatomy does not appear amenable to percutaneous revascularization. Vein mapping is being done as a consider possible surgical intervention.  Overall, major limitation at this time is PAD. No heart failure or anginal complaints. Recent LDL cholesterol drawn by Dr. Dwyane [name] revealed an LDL less than 50.  Past Medical History:  Diagnosis Date  . Arthritis   . CAD (coronary artery disease)    a.  s/p IMI and BMS 1997;  b. Myoview 4/16:  inf scar with peri-infarct ischemia, EF 34%; high risk;  c. LHC 5/16:  3 v CAD >> CABG (free L-LAD, S-OM, S-dRCA)  . Carotid stenosis    a. carotid US 6/16:  bilat ICA 1-39%  . Chronic kidney disease, stage II (mild)   . Chronic systolic CHF (congestive heart failure) ([place])   . Essential hypertension, benign   . Gout   . HLD (hyperlipidemia)   . Ischemic cardiomyopathy    a. EF by myoview 4/16 34%; b. LHC 5/16: EF 40-45%;  c. intraop TEE 6/16: EF 45-50%  . Orthostasis   . PAD (peripheral artery disease) ([facility])    Mario [name]  . Pneumonia    hx of  . Type II or unspecified type diabetes  mellitus without mention of complication, uncontrolled     Past Surgical History:  Procedure Laterality Date  . ABDOMINAL AORTAGRAM N/A 10/09/2013   Procedure: ABDOMINAL Mario [name];  Surgeon: Mario Hampshire, MD;  Location: [facility];  Service: Cardiovascular;  Laterality: N/A;  . ABDOMINAL AORTOGRAM N/A 12/28/2016   Procedure: ABDOMINAL AORTOGRAM;  Surgeon: Mario Sandy, MD;  Location: [facility];  Service: Cardiovascular;  Laterality: N/A;  . CARDIAC CATHETERIZATION N/A 10/02/2014   Procedure: Left Heart Cath and Coronary Angiography;  Surgeon: Mario Crome, MD;  Location: [facility];  Service: Cardiovascular;  Laterality: N/A;  . CATARACT EXTRACTION    . CHOLECYSTECTOMY    . COLONOSCOPY    . CORONARY ARTERY BYPASS GRAFT N/A 11/03/2014   Procedure: CORONARY ARTERY BYPASS GRAFTING  times three using left internal mammary and right greater saphenous vein;  Surgeon: Mario Pollack, MD;  Location: [facility];  Service: Open Heart Surgery;  Laterality: N/A;  . HERNIA REPAIR    . LOWER EXTREMITY ANGIOGRAPHY Bilateral 12/28/2016   Procedure: Lower Extremity Angiography;  Surgeon: Mario Sandy, MD;  Location: [facility];  Service: Cardiovascular;  Laterality: Bilateral;  . MELANOMA EXCISION    . PERIPHERAL VASCULAR BALLOON ANGIOPLASTY Right 12/28/2016   Procedure: PERIPHERAL VASCULAR BALLOON ANGIOPLASTY;  Surgeon: Mario Sandy, MD;  Location: [facility];  Service: Cardiovascular;  Laterality: Right;  SFA  UNSUCCESSFUL PTA  UNABLE TO CROSS LESION  . SKIN LESION EXCISION     multiple  . TEE WITHOUT CARDIOVERSION N/A 11/03/2014   Procedure: TRANSESOPHAGEAL ECHOCARDIOGRAM (TEE);  Surgeon: Mario Pollack, MD;  Location: [facility];  Service: Open Heart Surgery;  Laterality: N/A;    Current Medications: Outpatient Medications Prior to Visit  Medication Sig Dispense Refill  . acetaminophen (TYLENOL) 500 MG tablet Take 1,000 mg by mouth daily as  needed for moderate pain.    Marland Kitchen allopurinol (ZYLOPRIM) 100 MG tablet Take 1 tablet (100 mg total) by mouth daily. (Patient taking differently: Take 100 mg by mouth at bedtime. ) 90 tablet 1  . ARTIFICIAL TEAR OP Apply 1 drop to eye daily as needed (dry eyes).    Marland Kitchen aspirin EC 81 MG tablet Take 81 mg by mouth daily.    Marland Kitchen atorvastatin (LIPITOR) 40 MG tablet TAKE 1 TABLET EVERY DAY AT  6 PM (Patient taking differently: Take 40 mg by mouth at bedtime. ) 90 tablet 1  . Blood Glucose Monitoring Suppl (FREESTYLE FREEDOM LITE) w/Device KIT USE AS DIRECTED TO  CHECK  BLOOD  SUGAR  TWICE  DAILY 1 each 1  . calcium carbonate (TUMS - DOSED IN MG ELEMENTAL CALCIUM) 500 MG chewable tablet Chew 1 tablet by mouth daily as needed for indigestion or heartburn.    . carvedilol (COREG) 3.125 MG tablet Take 1 tablet (3.125 mg total) by mouth 2 (two) times daily. 60 tablet 2  . Cholecalciferol (VITAMIN D) 2000 units CAPS Take 2,000 Units by mouth daily.    . Coenzyme Q10 (COQ10) 200 MG CAPS Take 200 mg by mouth daily.    . fenofibrate micronized (ANTARA) 43 MG capsule Take 43 mg by mouth every evening.    . ferrous sulfate 325 (65 FE) MG tablet Take 325 mg by mouth daily.    . finasteride (PROSCAR) 5 MG tablet Take 5 mg by mouth every evening.   3  . folic acid (FOLVITE) 485 MCG tablet Take 400 mcg by mouth daily.    Marland Kitchen glipiZIDE (GLUCOTROL XL) 10 MG 24 hr tablet Take 1 tablet (10 mg total) by mouth at bedtime. 90 tablet 1  . glucose blood (FREESTYLE LITE) test strip Use as instructed to check blood sugar 2 times per day dx code E11.65 100 each 3  . Homeopathic Products (LEG CRAMP RELIEF) TABS Take 1 tablet by mouth daily as needed (leg cramps).    . hydrocortisone cream 1 % Apply 1 application topically daily as needed (irritation).    . Lancets (FREESTYLE) lancets Use as instructed to check blood sugar 2 times per day dx code E11.65 100 each 3  . lisinopril (PRINIVIL,ZESTRIL) 5 MG tablet Take 1 tablet (5 mg total) by  mouth daily. (Patient taking differently: Take 5 mg by mouth every evening. ) 30 tablet 2  . metFORMIN (GLUCOPHAGE) 1000 MG tablet TAKE 1 TABLET BY MOUTH TWICE A DAY WITH A MEAL 180 tablet 2  . miglitol (GLYSET) 50 MG tablet TAKE 1 TABLET BY MOUTH TWICE A DAY 180 tablet 0  . multivitamin-lutein (OCUVITE-LUTEIN) CAPS capsule Take 1 capsule by mouth 2 (two) times daily.    Marland Kitchen VICTOZA 18 MG/3ML SOPN INJECT 0.2MLS INTO THE SKIN ONCE A DAY AT THE SAME TIME (Patient taking differently: INJECT 1.2 MG INTO THE SKIN ONCE A DAY BEFORE DINNER) 6 mL 0  . vitamin B-12 (CYANOCOBALAMIN) 1000 MCG tablet Take 1,000 mcg by mouth daily.    Marland Kitchen  tamsulosin (FLOMAX) 0.4 MG CAPS capsule Take 1 capsule (0.4 mg total) by mouth daily. (Patient not taking: Reported on 01/02/2017) 30 capsule 0   No facility-administered medications prior to visit.      Allergies:   Penicillins; Plavix [clopidogrel bisulfate]; and Pletal [cilostazol]   Social History   Social History  . Marital status: Married    Spouse name: N/A  . Number of children: N/A  . Years of education: N/A   Social History Main Topics  . Smoking status: Former Smoker    Packs/day: 0.50    Years: 50.00    Types: Cigarettes    Quit date: 10/22/2014  . Smokeless tobacco: Never Used  . Alcohol use No  . Drug use: No  . Sexual activity: Not Asked   Other Topics Concern  . None   Social History Narrative  . None     Family History:  The patient's family history includes Colon cancer in his father; Heart disease in his mother.   ROS:   Please see the history of present illness.    Hearing loss, exertional bilateral leg pain.  All other systems reviewed and are negative.   PHYSICAL EXAM:   VS:  BP (!) 110/54 (BP Location: Left Arm)   Pulse 87   Ht _0  (1.88 m)   Wt 167 lb 12.8 oz (76.1 kg)   BMI 21.54 kg/m    GEN: Well developed, frail appearing 76 year old gentleman in no acute distress  HEENT: normal  Neck: no JVD, carotid bruits, or  masses Cardiac: RRR; no murmurs, rubs, or gallops,no edema  Respiratory:  clear to auscultation bilaterally, normal work of breathing GI: soft, nontender, nondistended, + BS MS: no deformity or atrophy. Nonhealing small ulcer right malleolus area of the foot.  Skin: warm and dry, no rash Neuro:  Alert and Oriented x 3, Strength and sensation are intact Psych: euthymic mood, full affect  Wt Readings from Last 3 Encounters:  01/02/17 167 lb 12.8 oz (76.1 kg)  12/28/16 165 lb (74.8 kg)  11/25/16 158 lb (71.7 kg)      Studies/Labs Reviewed:   EKG:  EKG  Normal sinus rhythm with QS pattern in V1 through V3. Mild first-degree AV block. Otherwise unremarkable.  Recent Labs: 12/28/2016: Hemoglobin 10.2 12/30/2016: ALT 40; BUN 32; Creatinine, Ser 1.38; Potassium 4.5; Sodium 138   Lipid Panel    Component Value Date/Time   CHOL 93 12/30/2016 1032   TRIG 100.0 12/30/2016 1032   HDL 35.10 (L) 12/30/2016 1032   CHOLHDL 3 12/30/2016 1032   VLDL 20.0 12/30/2016 1032   LDLCALC 38 12/30/2016 1032    Additional studies/ records that were reviewed today include:  Lower Extremity Arterial Doppler 12/28/2016: Findings: The aorta iliac and common femoral arteries are heavily calcified bilaterally. The left common femoral artery has a subtotal occlusion in the right common femoral artery has at least 50% stenosis. The right lower extremity the SFA occludes at its extreme distal point in the adductor canal and reconstitutes a popliteal artery at the knee with dominant runoff to the foot via the anterior tibial artery. The left lower extremity has occlusion of the popliteal artery that a short segment and reconstituted at the knee with also dominant runoff via the anterior tibial artery. We were unable to cross the heavily calcified area in the right popliteal artery and no intervention was undertaken.   ASSESSMENT:    1. Coronary artery disease involving coronary bypass graft of native heart without  angina pectoris   2. Essential hypertension, benign   3. PAD (peripheral artery disease) ([place])   4. Chronic kidney disease, stage II (mild)   5. Other hyperlipidemia   6. Tobacco use   7. Preoperative cardiovascular examination      PLAN:  In order of problems listed above:  1. Stable and would be low risk for peripheral arterial surgical revascularization. If surgical treatment is needed, he is cleared to proceed without further cardiac evaluation. 2. Excellent control. 3. Currently being managed by Dr. Donzetta [name]. Vein mapping is currently being done. 4. Stage III chronic kidney disease, stable 5. LDL cholesterol less than 60 when evaluated last week. No change in therapy is needed. 6. Denies use. 7. Cleared for upcoming peripheral arterial surgery if indicated. Cardiac risk would be 1% range, driven mostly by possibility of arrhythmia.  Clinical follow-up in one year. No change in therapy is currently indicated. Cleared for peripheral vascular surgery if needed.    Medication Adjustments/Labs and Tests Ordered: Current medicines are reviewed at length with the patient today.  Concerns regarding medicines are outlined above.  Medication changes, Labs and Tests ordered today are listed in the Patient Instructions below. Patient Instructions  Medication Instructions:  None  Labwork: None  Testing/Procedures: None  Follow-Up: Your physician wants you to follow-up in: 1 year with Dr. Tamala Julian.  You will receive a reminder letter in the mail two months in advance. If you don't receive a letter, please call our office to schedule the follow-up appointment.   Any Other Special Instructions Will Be Listed Below (If Applicable).     If you need a refill on your cardiac medications before your next appointment, please call your pharmacy.      Signed, Mario Grooms, MD  01/02/2017 11:04 AM    [facility] [address], [place], [place]  [address] Phone:  [phone]; Fax: [phone]

## 2017-01-02 ENCOUNTER — Encounter: Payer: Self-pay | Admitting: Interventional Cardiology

## 2017-01-02 ENCOUNTER — Ambulatory Visit (INDEPENDENT_AMBULATORY_CARE_PROVIDER_SITE_OTHER): Payer: PPO | Admitting: Interventional Cardiology

## 2017-01-02 VITALS — BP 110/54 | HR 87 | Ht 74.0 in | Wt 167.8 lb

## 2017-01-02 DIAGNOSIS — I10 Essential (primary) hypertension: Secondary | ICD-10-CM | POA: Diagnosis not present

## 2017-01-02 DIAGNOSIS — N182 Chronic kidney disease, stage 2 (mild): Secondary | ICD-10-CM | POA: Diagnosis not present

## 2017-01-02 DIAGNOSIS — Z01810 Encounter for preprocedural cardiovascular examination: Secondary | ICD-10-CM | POA: Diagnosis not present

## 2017-01-02 DIAGNOSIS — I739 Peripheral vascular disease, unspecified: Secondary | ICD-10-CM

## 2017-01-02 DIAGNOSIS — I25810 Atherosclerosis of coronary artery bypass graft(s) without angina pectoris: Secondary | ICD-10-CM | POA: Diagnosis not present

## 2017-01-02 DIAGNOSIS — Z720 Tobacco use: Secondary | ICD-10-CM

## 2017-01-02 DIAGNOSIS — E7849 Other hyperlipidemia: Secondary | ICD-10-CM

## 2017-01-02 DIAGNOSIS — E784 Other hyperlipidemia: Secondary | ICD-10-CM

## 2017-01-02 NOTE — Patient Instructions (Signed)

## 2017-01-04 ENCOUNTER — Encounter: Payer: Self-pay | Admitting: Endocrinology

## 2017-01-04 ENCOUNTER — Other Ambulatory Visit: Payer: Self-pay | Admitting: Interventional Cardiology

## 2017-01-04 ENCOUNTER — Ambulatory Visit (INDEPENDENT_AMBULATORY_CARE_PROVIDER_SITE_OTHER): Payer: PPO | Admitting: Endocrinology

## 2017-01-04 VITALS — BP 126/74 | HR 78 | Ht 74.0 in | Wt 160.2 lb

## 2017-01-04 DIAGNOSIS — E1165 Type 2 diabetes mellitus with hyperglycemia: Secondary | ICD-10-CM | POA: Diagnosis not present

## 2017-01-04 DIAGNOSIS — I10 Essential (primary) hypertension: Secondary | ICD-10-CM

## 2017-01-04 NOTE — Progress Notes (Signed)
Patient ID: Mario [name], male   DOB: 04-Dec-1940, 76 y.o.   MRN: [id number]   Reason for Appointment: Diabetes follow-up   History of Present Illness   Diagnosis: Type 2 DIABETES MELITUS, date of diagnosis:  1989     Previous history: He was initially diagnosed when hospitalized for pancreatitis and his previous endocrinologist had treated him with multiple medications because of progression of his diabetes. He was also put on Cycloset  which he has tolerated maximum dose He has had adjustments of his medication dosages the last couple of years and Januvia was restarted in 5/14 when blood sugars were higher. Dosages have been adjusted based on renal function Previously an A1c levels have been ranging from 6.6-7.5 His metformin was increased  when renal function was better and glipizide was changed to glipizide ER 10 mg . Did not appear to have better blood sugar control with Invokana He was started on Victoza for improved control when his A1c was 7.9% in 02/2015  Recent history:   Non-insulin hypoglycemic drugs:  Glyset 50 mg bid, glipizide ER 10 mg , Metformin 1 g twice a day,   Victoza 1.2 mg daily        His A1c is  fairly stable now at 7.5  Blood sugar patterns and problems:  He has only blood sugars checked in the morning hours and sometimes around lunchtime  Not clear if he has postprandial hyperglycemia as he does not check after meals or at night  Also cannot explain occasional high readings including 240 last week and 229 in the lab  Has not checked blood sugars since last Thursday  He is now eating more cereal in the morning at breakfast and this may be causing some high readings after breakfast  He did start exercise after his last visit except for the last week when he had his angiogram  Weight is about the same  He will take his Glyset right before eating and is trying to be consistent with this  Previously his fasting readings had been averaging  about 150  Side effects from medications: None.  Monitors blood glucose: Once a day or less .    Glucometer: Freestyle    Blood Glucose readings from   Mean values apply above for all meters except median for One Touch  PRE-MEAL Fasting Lunch Dinner PCS  Overall  Glucose range: 99-240   85-1 81        Mean/median:     143     Meals: 2-3 meals per day, no lunch often (yogurt); breakfast is cereal, [place] bacon, sometimes bread and juice at breakfast, sweets In the evening occasionally      Dietician 3/16       Physical activity: exercise:  going to the gym 3/7,Doing various exercises      Wt Readings from Last 3 Encounters:  01/04/17 160 lb 3.2 oz (72.7 kg)  01/02/17 167 lb 12.8 oz (76.1 kg)  12/28/16 165 lb (74.8 kg)    Lab Results  Component Value Date   HGBA1C 7.5 (H) 12/30/2016   HGBA1C 7.2 (H) 09/30/2016   HGBA1C 7.5 (H) 07/04/2016   Lab Results  Component Value Date   MICROALBUR 8.7 (H) 04/06/2016   LDLCALC 38 12/30/2016   CREATININE 1.38 12/30/2016     LABS:  Lab on 12/30/2016  Component Date Value Ref Range Status  . Hgb A1c MFr Bld 12/30/2016 7.5* 4.6 - 6.5 % Final   Glycemic Control Guidelines for  People with Diabetes:Non Diabetic:  <6%Goal of Therapy: <7%Additional Action Suggested:  >8%   . Sodium 12/30/2016 138  135 - 145 mEq/L Final  . Potassium 12/30/2016 4.5  3.5 - 5.1 mEq/L Final  . Chloride 12/30/2016 112  96 - 112 mEq/L Final  . CO2 12/30/2016 22  19 - 32 mEq/L Final  . Glucose, Bld 12/30/2016 228* 70 - 99 mg/dL Final  . BUN 12/30/2016 32* 6 - 23 mg/dL Final  . Creatinine, Ser 12/30/2016 1.38  0.40 - 1.50 mg/dL Final  . Total Bilirubin 12/30/2016 1.2  0.2 - 1.2 mg/dL Final  . Alkaline Phosphatase 12/30/2016 58  39 - 117 U/L Final  . AST 12/30/2016 24  0 - 37 U/L Final  . ALT 12/30/2016 40  0 - 53 U/L Final  . Total Protein 12/30/2016 5.9* 6.0 - 8.3 g/dL Final  . Albumin 12/30/2016 3.9  3.5 - 5.2 g/dL Final  . Calcium 12/30/2016 8.9  8.4 -  10.5 mg/dL Final  . GFR 12/30/2016 53.29* >60.00 mL/min Final  . Cholesterol 12/30/2016 93  0 - 200 mg/dL Final   ATP III Classification       Desirable:  < 200 mg/dL               Borderline High:  200 - 239 mg/dL          High:  > = 240 mg/dL  . Triglycerides 12/30/2016 100.0  0.0 - 149.0 mg/dL Final   Normal:  <150 mg/dLBorderline High:  150 - 199 mg/dL  . HDL 12/30/2016 35.10* >39.00 mg/dL Final  . VLDL 12/30/2016 20.0  0.0 - 40.0 mg/dL Final  . LDL Cholesterol 12/30/2016 38  0 - 99 mg/dL Final  . Total CHOL/HDL Ratio 12/30/2016 3   Final                  Men          Women1/2 Average Risk     3.4          3.3Average Risk          5.0          4.42X Average Risk          9.6          7.13X Average Risk          15.0          11.0                      . NonHDL 12/30/2016 57.69   Final   NOTE:  Non-HDL goal should be 30 mg/dL higher than patient's LDL goal (i.e. LDL goal of < 70 mg/dL, would have non-HDL goal of < 100 mg/dL)    Allergies as of 01/04/2017      Reactions   Penicillins Anaphylaxis   Has patient had a PCN reaction causing immediate rash, facial/tongue/throat swelling, SOB or lightheadedness with hypotension: Yes Has patient had a PCN reaction causing severe rash involving mucus membranes or skin necrosis: No Has patient had a PCN reaction that required hospitalization: Yes Has patient had a PCN reaction occurring within the last 10 years: Unknown If all of the above answers are "NO", then may proceed with Cephalosporin use.   Plavix [clopidogrel Bisulfate] Hives   Pletal [cilostazol] Itching      Medication List       Accurate as of 01/04/17 11:16 AM. Always use your most recent med list.  acetaminophen 500 MG tablet Commonly known as:  TYLENOL Take 1,000 mg by mouth daily as needed for moderate pain.   allopurinol 100 MG tablet Commonly known as:  ZYLOPRIM Take 1 tablet (100 mg total) by mouth daily.   ARTIFICIAL TEAR OP Apply 1 drop to eye daily as  needed (dry eyes).   aspirin EC 81 MG tablet Take 81 mg by mouth daily.   atorvastatin 40 MG tablet Commonly known as:  LIPITOR TAKE 1 TABLET EVERY DAY AT  6 PM   calcium carbonate 500 MG chewable tablet Commonly known as:  TUMS - dosed in mg elemental calcium Chew 1 tablet by mouth daily as needed for indigestion or heartburn.   carvedilol 3.125 MG tablet Commonly known as:  COREG TAKE 1 TABLET (3.125 MG TOTAL) BY MOUTH 2 (TWO) TIMES DAILY.   CoQ10 200 MG Caps Take 200 mg by mouth daily.   fenofibrate micronized 43 MG capsule Commonly known as:  ANTARA Take 43 mg by mouth every evening.   ferrous sulfate 325 (65 FE) MG tablet Take 325 mg by mouth daily.   finasteride 5 MG tablet Commonly known as:  PROSCAR Take 5 mg by mouth every evening.   folic acid 301 MCG tablet Commonly known as:  FOLVITE Take 400 mcg by mouth daily.   FREESTYLE FREEDOM LITE w/Device Kit USE AS DIRECTED TO  CHECK  BLOOD  SUGAR  TWICE  DAILY   freestyle lancets Use as instructed to check blood sugar 2 times per day dx code E11.65   glipiZIDE 10 MG 24 hr tablet Commonly known as:  GLUCOTROL XL Take 1 tablet (10 mg total) by mouth at bedtime.   glucose blood test strip Commonly known as:  FREESTYLE LITE Use as instructed to check blood sugar 2 times per day dx code E11.65   hydrocortisone cream 1 % Apply 1 application topically daily as needed (irritation).   LEG CRAMP RELIEF Tabs Take 1 tablet by mouth daily as needed (leg cramps).   lisinopril 5 MG tablet Commonly known as:  PRINIVIL,ZESTRIL TAKE 1 TABLET BY MOUTH EVERY DAY   metFORMIN 1000 MG tablet Commonly known as:  GLUCOPHAGE TAKE 1 TABLET BY MOUTH TWICE A DAY WITH A MEAL   miglitol 50 MG tablet Commonly known as:  GLYSET TAKE 1 TABLET BY MOUTH TWICE A DAY   multivitamin-lutein Caps capsule Take 1 capsule by mouth 2 (two) times daily.   tamsulosin 0.4 MG Caps capsule Commonly known as:  FLOMAX Take 0.4 mg by mouth 2  (two) times daily.   VICTOZA 18 MG/3ML Sopn Generic drug:  liraglutide INJECT 0.2MLS INTO THE SKIN ONCE A DAY AT THE SAME TIME   vitamin B-12 1000 MCG tablet Commonly known as:  CYANOCOBALAMIN Take 1,000 mcg by mouth daily.   Vitamin D 2000 units Caps Take 2,000 Units by mouth daily.       Allergies:  Allergies  Allergen Reactions  . Penicillins Anaphylaxis    Has patient had a PCN reaction causing immediate rash, facial/tongue/throat swelling, SOB or lightheadedness with hypotension: Yes Has patient had a PCN reaction causing severe rash involving mucus membranes or skin necrosis: No Has patient had a PCN reaction that required hospitalization: Yes Has patient had a PCN reaction occurring within the last 10 years: Unknown If all of the above answers are "NO", then may proceed with Cephalosporin use.   Marland Kitchen Plavix [Clopidogrel Bisulfate] Hives  . Pletal [Cilostazol] Itching    Past Medical History:  Diagnosis Date  . Arthritis   . CAD (coronary artery disease)    a.  s/p IMI and BMS 1997;  b. Myoview 4/16:  inf scar with peri-infarct ischemia, EF 34%; high risk;  c. LHC 5/16:  3 v CAD >> CABG (free L-LAD, S-OM, S-dRCA)  . Carotid stenosis    a. carotid US 6/16:  bilat ICA 1-39%  . Chronic kidney disease, stage II (mild)   . Chronic systolic CHF (congestive heart failure) ([place])   . Essential hypertension, benign   . Gout   . HLD (hyperlipidemia)   . Ischemic cardiomyopathy    a. EF by myoview 4/16 34%; b. LHC 5/16: EF 40-45%;  c. intraop TEE 6/16: EF 45-50%  . Orthostasis   . PAD (peripheral artery disease) (HCC)    Dr. Fletcher Anon  . Pneumonia    hx of  . Type II or unspecified type diabetes mellitus without mention of complication, uncontrolled     Past Surgical History:  Procedure Laterality Date  . ABDOMINAL AORTAGRAM N/A 10/09/2013   Procedure: ABDOMINAL Maxcine Ham;  Surgeon: Wellington Hampshire, MD;  Location: [place];  Service: Cardiovascular;  Laterality: N/A;  .  ABDOMINAL AORTOGRAM N/A 12/28/2016   Procedure: ABDOMINAL AORTOGRAM;  Surgeon: Waynetta Sandy, MD;  Location: [place] [facility];  Service: Cardiovascular;  Laterality: N/A;  . CARDIAC CATHETERIZATION N/A 10/02/2014   Procedure: Left Heart Cath and Coronary Angiography;  Surgeon: Belva Crome, MD;  Location: [place];  Service: Cardiovascular;  Laterality: N/A;  . CATARACT EXTRACTION    . CHOLECYSTECTOMY    . COLONOSCOPY    . CORONARY ARTERY BYPASS GRAFT N/A 11/03/2014   Procedure: CORONARY ARTERY BYPASS GRAFTING  times three using left internal mammary and right greater saphenous vein;  Surgeon: Gaye Pollack, MD;  Location: [facility];  Service: Open Heart Surgery;  Laterality: N/A;  . HERNIA REPAIR    . LOWER EXTREMITY ANGIOGRAPHY Bilateral 12/28/2016   Procedure: Lower Extremity Angiography;  Surgeon: Waynetta Sandy, MD;  Location: [place] [facility];  Service: Cardiovascular;  Laterality: Bilateral;  . MELANOMA EXCISION    . PERIPHERAL VASCULAR BALLOON ANGIOPLASTY Right 12/28/2016   Procedure: PERIPHERAL VASCULAR BALLOON ANGIOPLASTY;  Surgeon: Waynetta Sandy, MD;  Location: [place] [facility];  Service: Cardiovascular;  Laterality: Right;  SFA UNSUCCESSFUL PTA  UNABLE TO CROSS LESION  . SKIN LESION EXCISION     multiple  . TEE WITHOUT CARDIOVERSION N/A 11/03/2014   Procedure: TRANSESOPHAGEAL ECHOCARDIOGRAM (TEE);  Surgeon: Gaye Pollack, MD;  Location: [facility];  Service: Open Heart Surgery;  Laterality: N/A;    Family History  Problem Relation Age of Onset  . Colon cancer Father   . Heart disease Mother     Social History:  reports that he quit smoking about 2 years ago. His smoking use included Cigarettes. He has a 25.00 pack-year smoking history. He has never used smokeless tobacco. He reports that he does not drink alcohol or use drugs.  Review of Systems:   Hypertension:  taking lisinopril 5 mg and carvedilol low-dose and blood pressure is  well-controlled, managed by cardiologist  Lipids: Well controlled with atorvastatin 40 mg .  HDL is  usually low, triglycerides normal   Lab Results  Component Value Date   CHOL 93 12/30/2016   HDL 35.10 (L) 12/30/2016   LDLCALC 38 12/30/2016   TRIG 100.0 12/30/2016   CHOLHDL 3 12/30/2016    He is being recommended  surgery for bypassing his peripheral vascular problem in the right leg, has claudication    Examination:   BP 126/74   Pulse 78   Ht 6' 2"  (1.88 m)   Wt 160 lb 3.2 oz (72.7 kg)   SpO2 97%   BMI 20.57 kg/m   Body mass index is 20.57 kg/m.     ASSESSMENT/ PLAN:   Diabetes type 2:  See history of present illness for detailed discussion of his current management, blood sugar patterns and problems identified  Blood glucose control is reasonably good considering his age and duration of diabetes as well as comorbid conditions A1c 7.5 On multiple medications including Victoza He has not checked his blood sugar much especially after meals He is trying to exercise Discussed that he needs to work on her glycemic index foods such as cereal in the morning He needs to do more readings after meals to help adjust his diet Also reminded him to take Glyset at lunch if he is eating a meal  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   Quaneisha Hanisch 01/04/2017, 11:16 AM

## 2017-01-04 NOTE — Patient Instructions (Addendum)
May reduce cereal and add a protein  Check blood sugars on waking up  2-3/7  Also check blood sugars about 2 hours after a meal and do this after different meals by rotation  Recommended blood sugar levels on waking up is 90-130 and about 2 hours after meal is 130-160  Please bring your blood sugar monitor to each visit, thank you

## 2017-01-09 ENCOUNTER — Encounter (HOSPITAL_BASED_OUTPATIENT_CLINIC_OR_DEPARTMENT_OTHER): Payer: PPO | Attending: Internal Medicine

## 2017-01-09 DIAGNOSIS — I10 Essential (primary) hypertension: Secondary | ICD-10-CM | POA: Diagnosis not present

## 2017-01-09 DIAGNOSIS — Z87891 Personal history of nicotine dependence: Secondary | ICD-10-CM | POA: Insufficient documentation

## 2017-01-09 DIAGNOSIS — Z7984 Long term (current) use of oral hypoglycemic drugs: Secondary | ICD-10-CM | POA: Diagnosis not present

## 2017-01-09 DIAGNOSIS — I2510 Atherosclerotic heart disease of native coronary artery without angina pectoris: Secondary | ICD-10-CM | POA: Diagnosis not present

## 2017-01-09 DIAGNOSIS — L97312 Non-pressure chronic ulcer of right ankle with fat layer exposed: Secondary | ICD-10-CM | POA: Diagnosis not present

## 2017-01-09 DIAGNOSIS — L98492 Non-pressure chronic ulcer of skin of other sites with fat layer exposed: Secondary | ICD-10-CM | POA: Diagnosis not present

## 2017-01-09 DIAGNOSIS — I739 Peripheral vascular disease, unspecified: Secondary | ICD-10-CM | POA: Diagnosis not present

## 2017-01-09 DIAGNOSIS — E11622 Type 2 diabetes mellitus with other skin ulcer: Secondary | ICD-10-CM | POA: Diagnosis not present

## 2017-01-09 DIAGNOSIS — I70233 Atherosclerosis of native arteries of right leg with ulceration of ankle: Secondary | ICD-10-CM | POA: Diagnosis present

## 2017-01-10 ENCOUNTER — Other Ambulatory Visit: Payer: Self-pay | Admitting: Interventional Cardiology

## 2017-01-10 ENCOUNTER — Other Ambulatory Visit: Payer: Self-pay

## 2017-01-10 DIAGNOSIS — Z48812 Encounter for surgical aftercare following surgery on the circulatory system: Secondary | ICD-10-CM

## 2017-01-10 DIAGNOSIS — I10 Essential (primary) hypertension: Secondary | ICD-10-CM

## 2017-01-12 DIAGNOSIS — C44622 Squamous cell carcinoma of skin of right upper limb, including shoulder: Secondary | ICD-10-CM | POA: Diagnosis not present

## 2017-01-12 DIAGNOSIS — D045 Carcinoma in situ of skin of trunk: Secondary | ICD-10-CM | POA: Diagnosis not present

## 2017-01-12 DIAGNOSIS — C44519 Basal cell carcinoma of skin of other part of trunk: Secondary | ICD-10-CM | POA: Diagnosis not present

## 2017-01-16 DIAGNOSIS — I70233 Atherosclerosis of native arteries of right leg with ulceration of ankle: Secondary | ICD-10-CM | POA: Diagnosis not present

## 2017-01-16 DIAGNOSIS — E11622 Type 2 diabetes mellitus with other skin ulcer: Secondary | ICD-10-CM | POA: Diagnosis not present

## 2017-01-16 DIAGNOSIS — L97812 Non-pressure chronic ulcer of other part of right lower leg with fat layer exposed: Secondary | ICD-10-CM | POA: Diagnosis not present

## 2017-01-18 ENCOUNTER — Encounter: Payer: Self-pay | Admitting: Vascular Surgery

## 2017-01-20 ENCOUNTER — Ambulatory Visit (HOSPITAL_COMMUNITY)
Admission: RE | Admit: 2017-01-20 | Discharge: 2017-01-20 | Disposition: A | Discharge status: Home / Self Care | Payer: PPO | Source: Ambulatory Visit | Attending: Vascular Surgery | Admitting: Vascular Surgery

## 2017-01-20 DIAGNOSIS — Z48812 Encounter for surgical aftercare following surgery on the circulatory system: Secondary | ICD-10-CM

## 2017-01-27 ENCOUNTER — Ambulatory Visit (INDEPENDENT_AMBULATORY_CARE_PROVIDER_SITE_OTHER): Payer: PPO | Admitting: Vascular Surgery

## 2017-01-27 ENCOUNTER — Encounter: Payer: Self-pay | Admitting: Vascular Surgery

## 2017-01-27 VITALS — BP 147/73 | HR 72 | Temp 97.1°F | Resp 16 | Ht 74.0 in | Wt 162.0 lb

## 2017-01-27 DIAGNOSIS — I739 Peripheral vascular disease, unspecified: Secondary | ICD-10-CM

## 2017-01-27 NOTE — Progress Notes (Signed)
Patient ID: Mario [name], male   DOB: 10-24-1940, 76 y.o.   MRN: [id number]  Reason for Consult: PAD (3-4 wk f/u GSV mapping.  S/p aortogram bilat LE runoff.)   Referred by [name], MD  Subjective:     HPI:  Mario [name] is a 76 y.o. male bilateral lower extremity pain and decreased ABIs along with medial malleolar ulcer on the right. His saphenous vein has been harvested on that side and therefore the likelihood that this is related to venous ulceration is low. We recently performed angiogram which demonstrated occluded SFA and reconstituted a below-knee popliteal artery. He has remained very active and plays pickle ball several times a week and also swims. He is a former smoker but did quit. He does have diabetes was also contributes to his vascular disease. At this time he remains active has no other ulceration and the ulcer on his medial malleolus appears to be healing quite well. He has no other complaints related to today's visit.  Past Medical History:  Diagnosis Date  . Arthritis   . CAD (coronary artery disease)    a.  s/p IMI and BMS 1997;  b. Myoview 4/16:  inf scar with peri-infarct ischemia, EF 34%; high risk;  c. LHC 5/16:  3 v CAD >> CABG (free L-LAD, S-OM, S-dRCA)  . Carotid stenosis    a. carotid US 6/16:  bilat ICA 1-39%  . Chronic kidney disease, stage II (mild)   . Chronic systolic CHF (congestive heart failure) ([place])   . Essential hypertension, benign   . Gout   . HLD (hyperlipidemia)   . Ischemic cardiomyopathy    a. EF by myoview 4/16 34%; b. LHC 5/16: EF 40-45%;  c. intraop TEE 6/16: EF 45-50%  . Orthostasis   . PAD (peripheral artery disease) (HCC)    Dr. Fletcher Anon  . Pneumonia    hx of  . Type II or unspecified type diabetes mellitus without mention of complication, uncontrolled    Family History  Problem Relation Age of Onset  . Colon cancer Father   . Heart disease Mother    Past Surgical History:  Procedure Laterality Date  . ABDOMINAL  AORTAGRAM N/A 10/09/2013   Procedure: ABDOMINAL Maxcine Ham;  Surgeon: Wellington Hampshire, MD;  Location: [place] [facility] CATH LAB;  Service: Cardiovascular;  Laterality: N/A;  . ABDOMINAL AORTOGRAM N/A 12/28/2016   Procedure: ABDOMINAL AORTOGRAM;  Surgeon: Waynetta Sandy, MD;  Location: [place] [facility];  Service: Cardiovascular;  Laterality: N/A;  . CARDIAC CATHETERIZATION N/A 10/02/2014   Procedure: Left Heart Cath and Coronary Angiography;  Surgeon: Belva Crome, MD;  Location: [place] [facility];  Service: Cardiovascular;  Laterality: N/A;  . CATARACT EXTRACTION    . CHOLECYSTECTOMY    . COLONOSCOPY    . CORONARY ARTERY BYPASS GRAFT N/A 11/03/2014   Procedure: CORONARY ARTERY BYPASS GRAFTING  times three using left internal mammary and right greater saphenous vein;  Surgeon: Gaye Pollack, MD;  Location: [facility] OR;  Service: Open Heart Surgery;  Laterality: N/A;  . HERNIA REPAIR    . LOWER EXTREMITY ANGIOGRAPHY Bilateral 12/28/2016   Procedure: Lower Extremity Angiography;  Surgeon: Waynetta Sandy, MD;  Location: [facility];  Service: Cardiovascular;  Laterality: Bilateral;  . MELANOMA EXCISION    . PERIPHERAL VASCULAR BALLOON ANGIOPLASTY Right 12/28/2016   Procedure: PERIPHERAL VASCULAR BALLOON ANGIOPLASTY;  Surgeon: Waynetta Sandy, MD;  Location: [place] [facility];  Service: Cardiovascular;  Laterality: Right;  SFA UNSUCCESSFUL PTA  UNABLE TO CROSS LESION  . SKIN LESION EXCISION     multiple  . TEE WITHOUT CARDIOVERSION N/A 11/03/2014   Procedure: TRANSESOPHAGEAL ECHOCARDIOGRAM (TEE);  Surgeon: Gaye Pollack, MD;  Location: [facility];  Service: Open Heart Surgery;  Laterality: N/A;    Short Social History:  Social History  Substance Use Topics  . Smoking status: Former Smoker    Packs/day: 0.50    Years: 50.00    Types: Cigarettes    Quit date: 10/22/2014  . Smokeless tobacco: Never Used  . Alcohol use No    Allergies  Allergen Reactions  . Penicillins  Anaphylaxis    Has patient had a PCN reaction causing immediate rash, facial/tongue/throat swelling, SOB or lightheadedness with hypotension: Yes Has patient had a PCN reaction causing severe rash involving mucus membranes or skin necrosis: No Has patient had a PCN reaction that required hospitalization: Yes Has patient had a PCN reaction occurring within the last 10 years: Unknown If all of the above answers are "NO", then may proceed with Cephalosporin use.   Marland Kitchen Plavix [Clopidogrel Bisulfate] Hives  . Pletal [Cilostazol] Itching    Current Outpatient Prescriptions  Medication Sig Dispense Refill  . acetaminophen (TYLENOL) 500 MG tablet Take 1,000 mg by mouth daily as needed for moderate pain.    Marland Kitchen allopurinol (ZYLOPRIM) 100 MG tablet Take 1 tablet (100 mg total) by mouth daily. (Patient taking differently: Take 100 mg by mouth at bedtime. ) 90 tablet 1  . ARTIFICIAL TEAR OP Apply 1 drop to eye daily as needed (dry eyes).    Marland Kitchen aspirin EC 81 MG tablet Take 81 mg by mouth daily.    Marland Kitchen atorvastatin (LIPITOR) 40 MG tablet TAKE 1 TABLET EVERY DAY AT  6 PM (Patient taking differently: Take 40 mg by mouth at bedtime. ) 90 tablet 1  . Blood Glucose Monitoring Suppl (FREESTYLE FREEDOM LITE) w/Device KIT USE AS DIRECTED TO  CHECK  BLOOD  SUGAR  TWICE  DAILY 1 each 1  . calcium carbonate (TUMS - DOSED IN MG ELEMENTAL CALCIUM) 500 MG chewable tablet Chew 1 tablet by mouth daily as needed for indigestion or heartburn.    . carvedilol (COREG) 3.125 MG tablet TAKE 1 TABLET (3.125 MG TOTAL) BY MOUTH 2 (TWO) TIMES DAILY. 60 tablet 11  . Cholecalciferol (VITAMIN D) 2000 units CAPS Take 2,000 Units by mouth daily.    . Coenzyme Q10 (COQ10) 200 MG CAPS Take 200 mg by mouth daily.    . fenofibrate micronized (ANTARA) 43 MG capsule Take 43 mg by mouth every evening.    . ferrous sulfate 325 (65 FE) MG tablet Take 325 mg by mouth daily.    . finasteride (PROSCAR) 5 MG tablet Take 5 mg by mouth every evening.   3    . folic acid (FOLVITE) 676 MCG tablet Take 400 mcg by mouth daily.    Marland Kitchen glipiZIDE (GLUCOTROL XL) 10 MG 24 hr tablet Take 1 tablet (10 mg total) by mouth at bedtime. 90 tablet 1  . glucose blood (FREESTYLE LITE) test strip Use as instructed to check blood sugar 2 times per day dx code E11.65 100 each 3  . Homeopathic Products (LEG CRAMP RELIEF) TABS Take 1 tablet by mouth daily as needed (leg cramps).    . hydrocortisone cream 1 % Apply 1 application topically daily as needed (irritation).    . Lancets (FREESTYLE) lancets Use as instructed to check blood sugar 2 times per day  dx code E11.65 100 each 3  . lisinopril (PRINIVIL,ZESTRIL) 5 MG tablet TAKE 1 TABLET BY MOUTH EVERY DAY 30 tablet 11  . metFORMIN (GLUCOPHAGE) 1000 MG tablet TAKE 1 TABLET BY MOUTH TWICE A DAY WITH A MEAL 180 tablet 2  . miglitol (GLYSET) 50 MG tablet TAKE 1 TABLET BY MOUTH TWICE A DAY 180 tablet 0  . multivitamin-lutein (OCUVITE-LUTEIN) CAPS capsule Take 1 capsule by mouth 2 (two) times daily.    . tamsulosin (FLOMAX) 0.4 MG CAPS capsule Take 0.4 mg by mouth 2 (two) times daily.    Marland Kitchen VICTOZA 18 MG/3ML SOPN INJECT 0.2MLS INTO THE SKIN ONCE A DAY AT THE SAME TIME (Patient taking differently: INJECT 1.2 MG INTO THE SKIN ONCE A DAY BEFORE DINNER) 6 mL 0  . vitamin B-12 (CYANOCOBALAMIN) 1000 MCG tablet Take 1,000 mcg by mouth daily.     No current facility-administered medications for this visit.     Review of Systems  Constitutional:  Constitutional negative. HENT: HENT negative.  Eyes: Eyes negative.  Cardiovascular: Positive for claudication.  GI: Gastrointestinal negative.  Musculoskeletal: Positive for leg pain.  Skin: Skin negative.  Neurological: Neurological negative. Hematologic: Hematologic/lymphatic negative.  Psychiatric: Psychiatric negative.        Objective:  Objective   Vitals:   01/27/17 0915  BP: (!) 147/73  Pulse: 72  Resp: 16  Temp: (!) 97.1 F (36.2 C)  TempSrc: Oral  SpO2: 100%   Weight: 162 lb (73.5 kg)  Height: 6' 2"  (1.88 m)   Body mass index is 20.8 kg/m.  Physical Exam  Constitutional: He is oriented to person, place, and time. He appears well-developed.  Eyes: Pupils are equal, round, and reactive to light.  Neck: Normal range of motion.  Cardiovascular: Normal rate.   Pulses:      Femoral pulses are 2+ on the right side, and 2+ on the left side. Abdominal: Soft. He exhibits no mass.  Musculoskeletal: He exhibits no edema.  Neurological: He is alert and oriented to person, place, and time. He has normal reflexes.  Skin: Skin is warm and dry.  1cm minor ulceration medial mallelous  Psychiatric: He has a normal mood and affect. Judgment and thought content normal.    Data: Vein mapping does not show any suitable greater saphenous veins for bypass. Next  Ultrasound bedside demonstrates possible small saphenous veins that could be used for bypass of absolute necessary.     Assessment/Plan:     76 year old male with medial malleolar ulcer on the right without any significant venous reflux to treat an occluded SFA by angiography. We discussed possible bypass although the wound at this time looks stable. We will defer this at this time and have him follow up in 3 months Cialis wound is progressing. Should he have issues before then we can sterilely see him sooner.     Waynetta Sandy MD [facility]

## 2017-01-30 ENCOUNTER — Other Ambulatory Visit: Payer: Self-pay | Admitting: Endocrinology

## 2017-01-30 ENCOUNTER — Encounter (HOSPITAL_BASED_OUTPATIENT_CLINIC_OR_DEPARTMENT_OTHER): Payer: PPO | Attending: Internal Medicine

## 2017-01-30 DIAGNOSIS — L97312 Non-pressure chronic ulcer of right ankle with fat layer exposed: Secondary | ICD-10-CM | POA: Insufficient documentation

## 2017-01-30 DIAGNOSIS — I509 Heart failure, unspecified: Secondary | ICD-10-CM | POA: Insufficient documentation

## 2017-01-30 DIAGNOSIS — L97829 Non-pressure chronic ulcer of other part of left lower leg with unspecified severity: Secondary | ICD-10-CM | POA: Insufficient documentation

## 2017-01-30 DIAGNOSIS — Z85828 Personal history of other malignant neoplasm of skin: Secondary | ICD-10-CM | POA: Insufficient documentation

## 2017-01-30 DIAGNOSIS — E11622 Type 2 diabetes mellitus with other skin ulcer: Secondary | ICD-10-CM | POA: Insufficient documentation

## 2017-01-30 DIAGNOSIS — I70233 Atherosclerosis of native arteries of right leg with ulceration of ankle: Secondary | ICD-10-CM | POA: Insufficient documentation

## 2017-01-30 DIAGNOSIS — I2510 Atherosclerotic heart disease of native coronary artery without angina pectoris: Secondary | ICD-10-CM | POA: Insufficient documentation

## 2017-01-30 DIAGNOSIS — I110 Hypertensive heart disease with heart failure: Secondary | ICD-10-CM | POA: Insufficient documentation

## 2017-01-30 DIAGNOSIS — L97812 Non-pressure chronic ulcer of other part of right lower leg with fat layer exposed: Secondary | ICD-10-CM | POA: Insufficient documentation

## 2017-01-31 ENCOUNTER — Other Ambulatory Visit: Payer: Self-pay | Admitting: Endocrinology

## 2017-02-06 DIAGNOSIS — L97829 Non-pressure chronic ulcer of other part of left lower leg with unspecified severity: Secondary | ICD-10-CM | POA: Diagnosis not present

## 2017-02-06 DIAGNOSIS — I2510 Atherosclerotic heart disease of native coronary artery without angina pectoris: Secondary | ICD-10-CM | POA: Diagnosis not present

## 2017-02-06 DIAGNOSIS — Z85828 Personal history of other malignant neoplasm of skin: Secondary | ICD-10-CM | POA: Diagnosis not present

## 2017-02-06 DIAGNOSIS — E11622 Type 2 diabetes mellitus with other skin ulcer: Secondary | ICD-10-CM | POA: Diagnosis present

## 2017-02-06 DIAGNOSIS — I110 Hypertensive heart disease with heart failure: Secondary | ICD-10-CM | POA: Diagnosis not present

## 2017-02-06 DIAGNOSIS — I509 Heart failure, unspecified: Secondary | ICD-10-CM | POA: Diagnosis not present

## 2017-02-06 DIAGNOSIS — S81001A Unspecified open wound, right knee, initial encounter: Secondary | ICD-10-CM | POA: Diagnosis not present

## 2017-02-06 DIAGNOSIS — L97812 Non-pressure chronic ulcer of other part of right lower leg with fat layer exposed: Secondary | ICD-10-CM | POA: Diagnosis not present

## 2017-02-06 DIAGNOSIS — S91001A Unspecified open wound, right ankle, initial encounter: Secondary | ICD-10-CM | POA: Diagnosis not present

## 2017-02-06 DIAGNOSIS — I70233 Atherosclerosis of native arteries of right leg with ulceration of ankle: Secondary | ICD-10-CM | POA: Diagnosis not present

## 2017-02-06 DIAGNOSIS — L97312 Non-pressure chronic ulcer of right ankle with fat layer exposed: Secondary | ICD-10-CM | POA: Diagnosis not present

## 2017-02-13 ENCOUNTER — Other Ambulatory Visit: Payer: Self-pay | Admitting: Internal Medicine

## 2017-02-13 DIAGNOSIS — E11622 Type 2 diabetes mellitus with other skin ulcer: Secondary | ICD-10-CM | POA: Diagnosis not present

## 2017-02-13 DIAGNOSIS — S81802A Unspecified open wound, left lower leg, initial encounter: Secondary | ICD-10-CM | POA: Diagnosis not present

## 2017-02-13 DIAGNOSIS — L97812 Non-pressure chronic ulcer of other part of right lower leg with fat layer exposed: Secondary | ICD-10-CM | POA: Diagnosis not present

## 2017-02-13 DIAGNOSIS — C44722 Squamous cell carcinoma of skin of right lower limb, including hip: Secondary | ICD-10-CM | POA: Diagnosis not present

## 2017-02-13 DIAGNOSIS — L97519 Non-pressure chronic ulcer of other part of right foot with unspecified severity: Secondary | ICD-10-CM | POA: Diagnosis not present

## 2017-02-13 DIAGNOSIS — S91001A Unspecified open wound, right ankle, initial encounter: Secondary | ICD-10-CM | POA: Diagnosis not present

## 2017-02-16 DIAGNOSIS — D0439 Carcinoma in situ of skin of other parts of face: Secondary | ICD-10-CM | POA: Diagnosis not present

## 2017-02-20 ENCOUNTER — Encounter (HOSPITAL_BASED_OUTPATIENT_CLINIC_OR_DEPARTMENT_OTHER): Payer: PPO | Attending: Internal Medicine

## 2017-02-20 DIAGNOSIS — I509 Heart failure, unspecified: Secondary | ICD-10-CM | POA: Diagnosis not present

## 2017-02-20 DIAGNOSIS — E1151 Type 2 diabetes mellitus with diabetic peripheral angiopathy without gangrene: Secondary | ICD-10-CM | POA: Diagnosis not present

## 2017-02-20 DIAGNOSIS — I2510 Atherosclerotic heart disease of native coronary artery without angina pectoris: Secondary | ICD-10-CM | POA: Diagnosis not present

## 2017-02-20 DIAGNOSIS — I110 Hypertensive heart disease with heart failure: Secondary | ICD-10-CM | POA: Diagnosis not present

## 2017-02-20 DIAGNOSIS — S81802A Unspecified open wound, left lower leg, initial encounter: Secondary | ICD-10-CM | POA: Diagnosis not present

## 2017-02-20 DIAGNOSIS — C44722 Squamous cell carcinoma of skin of right lower limb, including hip: Secondary | ICD-10-CM | POA: Diagnosis not present

## 2017-02-20 DIAGNOSIS — I70233 Atherosclerosis of native arteries of right leg with ulceration of ankle: Secondary | ICD-10-CM | POA: Diagnosis present

## 2017-02-20 DIAGNOSIS — L97829 Non-pressure chronic ulcer of other part of left lower leg with unspecified severity: Secondary | ICD-10-CM | POA: Diagnosis not present

## 2017-02-20 DIAGNOSIS — L97311 Non-pressure chronic ulcer of right ankle limited to breakdown of skin: Secondary | ICD-10-CM | POA: Insufficient documentation

## 2017-02-24 ENCOUNTER — Other Ambulatory Visit: Payer: Self-pay | Admitting: Endocrinology

## 2017-02-27 ENCOUNTER — Other Ambulatory Visit: Payer: Self-pay | Admitting: Endocrinology

## 2017-03-13 DIAGNOSIS — I70233 Atherosclerosis of native arteries of right leg with ulceration of ankle: Secondary | ICD-10-CM | POA: Diagnosis not present

## 2017-03-13 DIAGNOSIS — S81802A Unspecified open wound, left lower leg, initial encounter: Secondary | ICD-10-CM | POA: Diagnosis not present

## 2017-03-13 DIAGNOSIS — C44722 Squamous cell carcinoma of skin of right lower limb, including hip: Secondary | ICD-10-CM | POA: Diagnosis not present

## 2017-03-13 DIAGNOSIS — S91001A Unspecified open wound, right ankle, initial encounter: Secondary | ICD-10-CM | POA: Diagnosis not present

## 2017-03-17 ENCOUNTER — Other Ambulatory Visit: Payer: Self-pay | Admitting: Endocrinology

## 2017-03-18 ENCOUNTER — Other Ambulatory Visit: Payer: Self-pay | Admitting: Endocrinology

## 2017-03-21 DIAGNOSIS — C44722 Squamous cell carcinoma of skin of right lower limb, including hip: Secondary | ICD-10-CM | POA: Diagnosis not present

## 2017-03-27 ENCOUNTER — Encounter (HOSPITAL_BASED_OUTPATIENT_CLINIC_OR_DEPARTMENT_OTHER): Payer: PPO | Attending: Internal Medicine

## 2017-03-27 DIAGNOSIS — E11622 Type 2 diabetes mellitus with other skin ulcer: Secondary | ICD-10-CM | POA: Diagnosis not present

## 2017-03-27 DIAGNOSIS — I70233 Atherosclerosis of native arteries of right leg with ulceration of ankle: Secondary | ICD-10-CM | POA: Diagnosis not present

## 2017-03-27 DIAGNOSIS — E1151 Type 2 diabetes mellitus with diabetic peripheral angiopathy without gangrene: Secondary | ICD-10-CM | POA: Diagnosis not present

## 2017-03-27 DIAGNOSIS — L97312 Non-pressure chronic ulcer of right ankle with fat layer exposed: Secondary | ICD-10-CM | POA: Diagnosis not present

## 2017-03-27 DIAGNOSIS — I110 Hypertensive heart disease with heart failure: Secondary | ICD-10-CM | POA: Diagnosis not present

## 2017-03-27 DIAGNOSIS — T8130XA Disruption of wound, unspecified, initial encounter: Secondary | ICD-10-CM | POA: Diagnosis not present

## 2017-03-27 DIAGNOSIS — I509 Heart failure, unspecified: Secondary | ICD-10-CM | POA: Diagnosis not present

## 2017-03-27 DIAGNOSIS — Y838 Other surgical procedures as the cause of abnormal reaction of the patient, or of later complication, without mention of misadventure at the time of the procedure: Secondary | ICD-10-CM | POA: Diagnosis not present

## 2017-03-27 DIAGNOSIS — L97812 Non-pressure chronic ulcer of other part of right lower leg with fat layer exposed: Secondary | ICD-10-CM | POA: Diagnosis not present

## 2017-03-27 DIAGNOSIS — L97829 Non-pressure chronic ulcer of other part of left lower leg with unspecified severity: Secondary | ICD-10-CM | POA: Insufficient documentation

## 2017-03-30 ENCOUNTER — Other Ambulatory Visit: Payer: Self-pay | Admitting: Endocrinology

## 2017-04-03 ENCOUNTER — Other Ambulatory Visit: Payer: Self-pay | Admitting: Internal Medicine

## 2017-04-03 DIAGNOSIS — C44712 Basal cell carcinoma of skin of right lower limb, including hip: Secondary | ICD-10-CM | POA: Diagnosis not present

## 2017-04-03 DIAGNOSIS — I70233 Atherosclerosis of native arteries of right leg with ulceration of ankle: Secondary | ICD-10-CM | POA: Diagnosis not present

## 2017-04-03 DIAGNOSIS — L97312 Non-pressure chronic ulcer of right ankle with fat layer exposed: Secondary | ICD-10-CM | POA: Diagnosis not present

## 2017-04-03 DIAGNOSIS — E11622 Type 2 diabetes mellitus with other skin ulcer: Secondary | ICD-10-CM | POA: Diagnosis not present

## 2017-04-04 ENCOUNTER — Other Ambulatory Visit (INDEPENDENT_AMBULATORY_CARE_PROVIDER_SITE_OTHER): Payer: PPO

## 2017-04-04 DIAGNOSIS — E1165 Type 2 diabetes mellitus with hyperglycemia: Secondary | ICD-10-CM | POA: Diagnosis not present

## 2017-04-04 LAB — COMPREHENSIVE METABOLIC PANEL
ALT: 21 U/L (ref 0–53)
AST: 20 U/L (ref 0–37)
Albumin: 3.9 g/dL (ref 3.5–5.2)
Alkaline Phosphatase: 60 U/L (ref 39–117)
BUN: 24 mg/dL — ABNORMAL HIGH (ref 6–23)
CO2: 22 mEq/L (ref 19–32)
Calcium: 9 mg/dL (ref 8.4–10.5)
Chloride: 110 mEq/L (ref 96–112)
Creatinine, Ser: 1.19 mg/dL (ref 0.40–1.50)
GFR: 63.17 mL/min (ref >60.00)
Glucose, Bld: 155 mg/dL — ABNORMAL HIGH (ref 70–99)
Potassium: 4.3 mEq/L (ref 3.5–5.1)
Sodium: 139 mEq/L (ref 135–145)
Total Bilirubin: 0.9 mg/dL (ref 0.2–1.2)
Total Protein: 6.3 g/dL (ref 6.0–8.3)

## 2017-04-04 LAB — HEMOGLOBIN A1C: Hgb A1c MFr Bld: 7.3 % — ABNORMAL HIGH (ref 4.6–6.5)

## 2017-04-07 ENCOUNTER — Encounter: Payer: Self-pay | Admitting: Endocrinology

## 2017-04-07 ENCOUNTER — Other Ambulatory Visit: Payer: PPO

## 2017-04-07 ENCOUNTER — Ambulatory Visit: Payer: PPO | Admitting: Endocrinology

## 2017-04-07 VITALS — BP 130/72 | HR 87 | Ht 74.0 in | Wt 164.0 lb

## 2017-04-07 DIAGNOSIS — E1165 Type 2 diabetes mellitus with hyperglycemia: Secondary | ICD-10-CM

## 2017-04-07 LAB — MICROALBUMIN / CREATININE URINE RATIO
Creatinine,U: 107.9 mg/dL
Microalb Creat Ratio: 10.9 mg/g (ref 0.0–30.0)
Microalb, Ur: 11.7 mg/dL — ABNORMAL HIGH (ref 0.0–1.9)

## 2017-04-07 MED ORDER — LIRAGLUTIDE 18 MG/3ML ~~LOC~~ SOPN: 1.8000 mg | PEN_INJECTOR | Freq: Every day | SUBCUTANEOUS | 3 refills | Status: DC

## 2017-04-07 NOTE — Progress Notes (Signed)
Patient ID: Mario [name], male   DOB: 1941-03-12, 76 y.o.   MRN: [id number]   Reason for Appointment: Diabetes follow-up   History of Present Illness   Diagnosis: Type 2 DIABETES MELITUS, date of diagnosis:  1989     Previous history: He was initially diagnosed when hospitalized for pancreatitis and his previous endocrinologist had treated him with multiple medications because of progression of his diabetes. He was also put on Cycloset  which he has tolerated maximum dose He has had adjustments of his medication dosages the last couple of years and Januvia was restarted in 5/14 when blood sugars were higher. Dosages have been adjusted based on renal function Previously an A1c levels have been ranging from 6.6-7.5 His metformin was increased  when renal function was better and glipizide was changed to glipizide ER 10 mg . Did not appear to have better blood sugar control with Invokana He was started on Victoza for improved control when his A1c was 7.9% in 02/2015  Recent history:   Non-insulin hypoglycemic drugs:  Glyset 50 mg bid, glipizide ER 10 mg , Metformin 1 g twice a day,   Victoza 1.2 mg daily        His A1c is  fairly stable now at 7.3  Blood sugar patterns and problems:  He has only blood sugars checked in the morning hours and sometimes around lunchtime, no postprandial readings at night and usually not after lunch when he has lunch  For the last 2-3 weeks because of his leg biopsy/surgery has not exercised  Has only a couple of high readings made today and not clear if his blood sugars are consistently high after breakfast; previously was asked to cut back on cereal  Weight is slightly higher  He will take his Glyset right before eating with breakfast and supper  Previously his fasting readings had been averaging about 150  Side effects from medications: None.  Monitors blood glucose: Once a day or less .    Glucometer: Freestyle    Blood Glucose  readings from ordered for download: He has only 8 readings for the last 2 weeks  Mean values apply above for all meters except median for One Touch  PRE-MEAL Fasting Lunch 1-3 PM  Bedtime Overall  Glucose range: 109-183  205, 223  120, 136     Mean/median:     160    Meals: 2-3 meals per day, no lunch often (yogurt); breakfast is at 9 am cereal, [place] bacon, sometimes bread and eggs at breakfast; evening occasionally      Dietician consultation last: 3/16        Physical activity: exercise:  going to the gym 0-3/7,Doing various exercises      Wt Readings from Last 3 Encounters:  04/07/17 164 lb (74.4 kg)  01/27/17 162 lb (73.5 kg)  01/04/17 160 lb 3.2 oz (72.7 kg)    Lab Results  Component Value Date   HGBA1C 7.3 (H) 04/04/2017   HGBA1C 7.5 (H) 12/30/2016   HGBA1C 7.2 (H) 09/30/2016   Lab Results  Component Value Date   MICROALBUR 11.7 (H) 04/07/2017   LDLCALC 38 12/30/2016   CREATININE 1.19 04/04/2017     LABS:  Lab on 04/07/2017  Component Date Value Ref Range Status  . Microalb, Ur 04/07/2017 11.7* 0.0 - 1.9 mg/dL Final  . Creatinine,U 04/07/2017 107.9  mg/dL Final  . Microalb Creat Ratio 04/07/2017 10.9  0.0 - 30.0 mg/g Final  Lab on 04/04/2017  Component  Date Value Ref Range Status  . Sodium 04/04/2017 139  135 - 145 mEq/L Final  . Potassium 04/04/2017 4.3  3.5 - 5.1 mEq/L Final  . Chloride 04/04/2017 110  96 - 112 mEq/L Final  . CO2 04/04/2017 22  19 - 32 mEq/L Final  . Glucose, Bld 04/04/2017 155* 70 - 99 mg/dL Final  . BUN 04/04/2017 24* 6 - 23 mg/dL Final  . Creatinine, Ser 04/04/2017 1.19  0.40 - 1.50 mg/dL Final  . Total Bilirubin 04/04/2017 0.9  0.2 - 1.2 mg/dL Final  . Alkaline Phosphatase 04/04/2017 60  39 - 117 U/L Final  . AST 04/04/2017 20  0 - 37 U/L Final  . ALT 04/04/2017 21  0 - 53 U/L Final  . Total Protein 04/04/2017 6.3  6.0 - 8.3 g/dL Final  . Albumin 04/04/2017 3.9  3.5 - 5.2 g/dL Final  . Calcium 04/04/2017 9.0  8.4 - 10.5 mg/dL  Final  . GFR 04/04/2017 63.17  >60.00 mL/min Final  . Hgb A1c MFr Bld 04/04/2017 7.3* 4.6 - 6.5 % Final   Glycemic Control Guidelines for People with Diabetes:Non Diabetic:  <6%Goal of Therapy: <7%Additional Action Suggested:  >8%     Allergies as of 04/07/2017      Reactions   Penicillins Anaphylaxis   Has patient had a PCN reaction causing immediate rash, facial/tongue/throat swelling, SOB or lightheadedness with hypotension: Yes Has patient had a PCN reaction causing severe rash involving mucus membranes or skin necrosis: No Has patient had a PCN reaction that required hospitalization: Yes Has patient had a PCN reaction occurring within the last 10 years: Unknown If all of the above answers are "NO", then may proceed with Cephalosporin use.   Plavix [clopidogrel Bisulfate] Hives   Pletal [cilostazol] Itching      Medication List        Accurate as of 04/07/17 11:59 PM. Always use your most recent med list.          acetaminophen 500 MG tablet Commonly known as:  TYLENOL Take 1,000 mg by mouth daily as needed for moderate pain.   allopurinol 100 MG tablet Commonly known as:  ZYLOPRIM Take 1 tablet by mouth daily   ARTIFICIAL TEAR OP Apply 1 drop to eye daily as needed (dry eyes).   aspirin EC 81 MG tablet Take 81 mg by mouth daily.   atorvastatin 40 MG tablet Commonly known as:  LIPITOR TAKE 1 TABLET EVERY DAY AT 6 PM   calcium carbonate 500 MG chewable tablet Commonly known as:  TUMS - dosed in mg elemental calcium Chew 1 tablet by mouth daily as needed for indigestion or heartburn.   carvedilol 3.125 MG tablet Commonly known as:  COREG TAKE 1 TABLET (3.125 MG TOTAL) BY MOUTH 2 (TWO) TIMES DAILY.   CoQ10 200 MG Caps Take 200 mg by mouth daily.   fenofibrate micronized 43 MG capsule Commonly known as:  ANTARA Take 43 mg by mouth every evening.   ferrous sulfate 325 (65 FE) MG tablet Take 325 mg by mouth daily.   finasteride 5 MG tablet Commonly known  as:  PROSCAR Take 5 mg by mouth every evening.   folic acid 008 MCG tablet Commonly known as:  FOLVITE Take 400 mcg by mouth daily.   FREESTYLE FREEDOM LITE w/Device Kit USE AS DIRECTED TO  CHECK  BLOOD  SUGAR  TWICE  DAILY   freestyle lancets Use as instructed to check blood sugar 2 times per day dx code  E11.65   glipiZIDE 10 MG 24 hr tablet Commonly known as:  GLUCOTROL XL Take 1 tablet by mouth at bedtime   glucose blood test strip Commonly known as:  FREESTYLE LITE Use as instructed to check blood sugar 2 times per day dx code E11.65   hydrocortisone cream 1 % Apply 1 application topically daily as needed (irritation).   LEG CRAMP RELIEF Tabs Take 1 tablet by mouth daily as needed (leg cramps).   liraglutide 18 MG/3ML Sopn Commonly known as:  VICTOZA Inject 0.3 mLs (1.8 mg total) daily into the skin.   lisinopril 5 MG tablet Commonly known as:  PRINIVIL,ZESTRIL TAKE 1 TABLET BY MOUTH EVERY DAY   metFORMIN 1000 MG tablet Commonly known as:  GLUCOPHAGE TAKE 1 TABLET BY MOUTH TWICE A DAY WITH A MEAL   miglitol 50 MG tablet Commonly known as:  GLYSET TAKE ONE TABLET BY MOUTH TWICE DAILY   multivitamin-lutein Caps capsule Take 1 capsule by mouth 2 (two) times daily.   tamsulosin 0.4 MG Caps capsule Commonly known as:  FLOMAX Take 0.4 mg by mouth 2 (two) times daily.   vitamin B-12 1000 MCG tablet Commonly known as:  CYANOCOBALAMIN Take 1,000 mcg by mouth daily.   Vitamin D 2000 units Caps Take 2,000 Units by mouth daily.       Allergies:  Allergies  Allergen Reactions  . Penicillins Anaphylaxis    Has patient had a PCN reaction causing immediate rash, facial/tongue/throat swelling, SOB or lightheadedness with hypotension: Yes Has patient had a PCN reaction causing severe rash involving mucus membranes or skin necrosis: No Has patient had a PCN reaction that required hospitalization: Yes Has patient had a PCN reaction occurring within the last 10  years: Unknown If all of the above answers are "NO", then may proceed with Cephalosporin use.   Marland Kitchen Plavix [Clopidogrel Bisulfate] Hives  . Pletal [Cilostazol] Itching    Past Medical History:  Diagnosis Date  . Arthritis   . CAD (coronary artery disease)    a.  s/p IMI and BMS 1997;  b. Myoview 4/16:  inf scar with peri-infarct ischemia, EF 34%; high risk;  c. LHC 5/16:  3 v CAD >> CABG (free L-LAD, S-OM, S-dRCA)  . Carotid stenosis    a. carotid US 6/16:  bilat ICA 1-39%  . Chronic kidney disease, stage II (mild)   . Chronic systolic CHF (congestive heart failure) ([place])   . Essential hypertension, benign   . Gout   . HLD (hyperlipidemia)   . Ischemic cardiomyopathy    a. EF by myoview 4/16 34%; b. LHC 5/16: EF 40-45%;  c. intraop TEE 6/16: EF 45-50%  . Orthostasis   . PAD (peripheral artery disease) (HCC)    Dr. Fletcher Anon  . Pneumonia    hx of  . Type II or unspecified type diabetes mellitus without mention of complication, uncontrolled     Past Surgical History:  Procedure Laterality Date  . ABDOMINAL AORTAGRAM N/A 10/09/2013   Performed by Wellington Hampshire, MD at [facility] CATH LAB  . ABDOMINAL AORTOGRAM N/A 12/28/2016   Performed by Waynetta Sandy, MD at [place] CV LAB  . ANGIOGRAM EXTREMITY BILATERAL Bilateral 10/09/2013   Performed by Wellington Hampshire, MD at [facility] CATH LAB  . CATARACT EXTRACTION    . CHOLECYSTECTOMY    . COLONOSCOPY    . CORONARY ARTERY BYPASS GRAFTING  times three using left internal mammary and right greater saphenous vein N/A 11/03/2014   Performed  by Gaye Pollack, MD at [facility] OR  . HERNIA REPAIR    . Left Heart Cath and Coronary Angiography N/A 10/02/2014   Performed by Belva Crome, MD at [place] CV LAB  . Lower Extremity Angiography Bilateral 12/28/2016   Performed by Waynetta Sandy, MD at [place] CV LAB  . MELANOMA EXCISION    . PERIPHERAL VASCULAR BALLOON ANGIOPLASTY Right 12/28/2016   Performed by Waynetta Sandy,  MD at [place] CV LAB  . SKIN LESION EXCISION     multiple  . TRANSESOPHAGEAL ECHOCARDIOGRAM (TEE) N/A 11/03/2014   Performed by Gaye Pollack, MD at [facility] OR    Family History  Problem Relation Age of Onset  . Colon cancer Father   . Heart disease Mother     Social History:  reports that he quit smoking about 2 years ago. His smoking use included cigarettes. He has a 25.00 pack-year smoking history. he has never used smokeless tobacco. He reports that he does not drink alcohol or use drugs.  Review of Systems:   Hypertension:  taking lisinopril 5 mg and carvedilol low-dose and blood pressure is well-controlled, managed by cardiologist  Lipids: Well controlled with atorvastatin 40 mg .  HDL is  usually low, triglycerides normal   Lab Results  Component Value Date   CHOL 93 12/30/2016   HDL 35.10 (L) 12/30/2016   LDLCALC 38 12/30/2016   TRIG 100.0 12/30/2016   CHOLHDL 3 12/30/2016    He has only mild to moderate claudication and surgery has not been planned  His leg ulcer apparently is skin cancer and has been treated   Examination:   BP 130/72   Pulse 87   Ht 6' 2"  (1.88 m)   Wt 164 lb (74.4 kg)   SpO2 94%   BMI 21.06 kg/m   Body mass index is 21.06 kg/m.     ASSESSMENT/ PLAN:   Diabetes type 2:  See history of present illness for detailed discussion of his current management, blood sugar patterns and problems identified  Blood glucose control is reasonably good with A1c 7.3  This is adequate considering his age and duration of diabetes as well as comorbid conditions He appears usually fairly motivated to exercise and watch his diet However discussed having protein at breakfast and not cereal Also needs to check more readings after evening meal Discussed blood sugar targets at various times  For now he agrees to increase the Victoza to 1.8 mg daily for better postprandial control  Nonhealing ulcer on the leg and claudication:  Recent surgery, he'll be  followed by skin. until this is healed  We will see him back in 3 months with another A1c   Patient Instructions  Check blood sugars on waking up  2-3/7  Also check blood sugars about 2 hours after a meal and do this after different meals by rotation  Recommended blood sugar levels on waking up is 90-130 and about 2 hours after meal is 130-160  Please bring your blood sugar monitor to each visit, thank you     [facility] 04/09/2017, 6:36 PM

## 2017-04-07 NOTE — Patient Instructions (Signed)
Check blood sugars on waking up  2-3/7  Also check blood sugars about 2 hours after a meal and do this after different meals by rotation  Recommended blood sugar levels on waking up is 90-130 and about 2 hours after meal is 130-160  Please bring your blood sugar monitor to each visit, thank you   

## 2017-04-09 ENCOUNTER — Encounter: Payer: Self-pay | Admitting: Endocrinology

## 2017-04-10 ENCOUNTER — Other Ambulatory Visit: Payer: Self-pay | Admitting: Internal Medicine

## 2017-04-10 DIAGNOSIS — L97812 Non-pressure chronic ulcer of other part of right lower leg with fat layer exposed: Secondary | ICD-10-CM | POA: Diagnosis not present

## 2017-04-10 DIAGNOSIS — L97312 Non-pressure chronic ulcer of right ankle with fat layer exposed: Secondary | ICD-10-CM | POA: Diagnosis not present

## 2017-04-10 DIAGNOSIS — L97822 Non-pressure chronic ulcer of other part of left lower leg with fat layer exposed: Secondary | ICD-10-CM | POA: Diagnosis not present

## 2017-04-10 DIAGNOSIS — I70233 Atherosclerosis of native arteries of right leg with ulceration of ankle: Secondary | ICD-10-CM | POA: Diagnosis not present

## 2017-04-10 DIAGNOSIS — L859 Epidermal thickening, unspecified: Secondary | ICD-10-CM | POA: Diagnosis not present

## 2017-04-10 DIAGNOSIS — E11622 Type 2 diabetes mellitus with other skin ulcer: Secondary | ICD-10-CM | POA: Diagnosis not present

## 2017-04-17 DIAGNOSIS — L97312 Non-pressure chronic ulcer of right ankle with fat layer exposed: Secondary | ICD-10-CM | POA: Diagnosis not present

## 2017-04-17 DIAGNOSIS — L97812 Non-pressure chronic ulcer of other part of right lower leg with fat layer exposed: Secondary | ICD-10-CM | POA: Diagnosis not present

## 2017-04-17 DIAGNOSIS — I70233 Atherosclerosis of native arteries of right leg with ulceration of ankle: Secondary | ICD-10-CM | POA: Diagnosis not present

## 2017-04-17 DIAGNOSIS — L97822 Non-pressure chronic ulcer of other part of left lower leg with fat layer exposed: Secondary | ICD-10-CM | POA: Diagnosis not present

## 2017-04-24 ENCOUNTER — Encounter (HOSPITAL_BASED_OUTPATIENT_CLINIC_OR_DEPARTMENT_OTHER): Payer: PPO | Attending: Internal Medicine

## 2017-04-24 DIAGNOSIS — E1151 Type 2 diabetes mellitus with diabetic peripheral angiopathy without gangrene: Secondary | ICD-10-CM | POA: Diagnosis not present

## 2017-04-24 DIAGNOSIS — I110 Hypertensive heart disease with heart failure: Secondary | ICD-10-CM | POA: Diagnosis not present

## 2017-04-24 DIAGNOSIS — Y838 Other surgical procedures as the cause of abnormal reaction of the patient, or of later complication, without mention of misadventure at the time of the procedure: Secondary | ICD-10-CM | POA: Insufficient documentation

## 2017-04-24 DIAGNOSIS — L97312 Non-pressure chronic ulcer of right ankle with fat layer exposed: Secondary | ICD-10-CM | POA: Diagnosis not present

## 2017-04-24 DIAGNOSIS — C44712 Basal cell carcinoma of skin of right lower limb, including hip: Secondary | ICD-10-CM | POA: Diagnosis not present

## 2017-04-24 DIAGNOSIS — E11622 Type 2 diabetes mellitus with other skin ulcer: Secondary | ICD-10-CM | POA: Insufficient documentation

## 2017-04-24 DIAGNOSIS — I509 Heart failure, unspecified: Secondary | ICD-10-CM | POA: Diagnosis not present

## 2017-04-24 DIAGNOSIS — T8189XA Other complications of procedures, not elsewhere classified, initial encounter: Secondary | ICD-10-CM | POA: Insufficient documentation

## 2017-04-24 DIAGNOSIS — L97829 Non-pressure chronic ulcer of other part of left lower leg with unspecified severity: Secondary | ICD-10-CM | POA: Diagnosis not present

## 2017-04-24 DIAGNOSIS — L97812 Non-pressure chronic ulcer of other part of right lower leg with fat layer exposed: Secondary | ICD-10-CM | POA: Diagnosis not present

## 2017-04-24 DIAGNOSIS — I2510 Atherosclerotic heart disease of native coronary artery without angina pectoris: Secondary | ICD-10-CM | POA: Diagnosis not present

## 2017-04-28 ENCOUNTER — Other Ambulatory Visit: Payer: Self-pay

## 2017-04-28 ENCOUNTER — Encounter: Payer: Self-pay | Admitting: Vascular Surgery

## 2017-04-28 ENCOUNTER — Ambulatory Visit: Payer: PPO | Admitting: Vascular Surgery

## 2017-04-28 VITALS — BP 149/84 | HR 85 | Temp 97.0°F | Resp 18 | Ht 74.0 in | Wt 162.0 lb

## 2017-04-28 DIAGNOSIS — I739 Peripheral vascular disease, unspecified: Secondary | ICD-10-CM | POA: Diagnosis not present

## 2017-04-28 NOTE — Progress Notes (Signed)
Patient ID: Mario [name], male   DOB: [birth year], 76 y.o.   MRN: [id number]  Reason for Consult: PAD (3 month f/u)   Referred by [name], MD  Subjective:     HPI:  Mario [name] is a 76 y.o. male f/u from previous angiogram rle with attempted intervention. 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 He is to undergo moh's procedure on area. Continues to play pickle ball regularly. Denies pain in his legs.   Past Medical History:  Diagnosis Date  . Arthritis   . CAD (coronary artery disease)    a.  s/p IMI and BMS 1997;  b. Myoview 4/16:  inf scar with peri-infarct ischemia, EF 34%; high risk;  c. LHC 5/16:  3 v CAD >> CABG (free L-LAD, S-OM, S-dRCA)  . Carotid stenosis    a. carotid US 6/16:  bilat ICA 1-39%  . Chronic kidney disease, stage II (mild)   . Chronic systolic CHF (congestive heart failure) ([place])   . Essential hypertension, benign   . Gout   . HLD (hyperlipidemia)   . Ischemic cardiomyopathy    a. EF by myoview 4/16 34%; b. LHC 5/16: EF 40-45%;  c. intraop TEE 6/16: EF 45-50%  . Orthostasis   . PAD (peripheral artery disease) (HCC)    Dr. Fletcher Anon  . Pneumonia    hx of  . Type II or unspecified type diabetes mellitus without mention of complication, uncontrolled    Family History  Problem Relation Age of Onset  . Colon cancer Father   . Heart disease Mother    Past Surgical History:  Procedure Laterality Date  . ABDOMINAL AORTAGRAM N/A 10/09/2013   Procedure: ABDOMINAL Maxcine Ham;  Surgeon: Wellington Hampshire, MD;  Location: [facility] CATH LAB;  Service: Cardiovascular;  Laterality: N/A;  . ABDOMINAL AORTOGRAM N/A 12/28/2016   Procedure: ABDOMINAL AORTOGRAM;  Surgeon: Waynetta Sandy, MD;  Location: [place] CV LAB;  Service: Cardiovascular;  Laterality: N/A;  . CARDIAC CATHETERIZATION N/A 10/02/2014   Procedure: Left Heart Cath and Coronary Angiography;  Surgeon: Belva Crome,  MD;  Location: [place] [facility] LAB;  Service: Cardiovascular;  Laterality: N/A;  . CATARACT EXTRACTION    . CHOLECYSTECTOMY    . COLONOSCOPY    . CORONARY ARTERY BYPASS GRAFT N/A 11/03/2014   Procedure: CORONARY ARTERY BYPASS GRAFTING  times three using left internal mammary and right greater saphenous vein;  Surgeon: Gaye Pollack, MD;  Location: [place] [facility] OR;  Service: Open Heart Surgery;  Laterality: N/A;  . HERNIA REPAIR    . LOWER EXTREMITY ANGIOGRAPHY Bilateral 12/28/2016   Procedure: Lower Extremity Angiography;  Surgeon: Waynetta Sandy, MD;  Location: [place] CV LAB;  Service: Cardiovascular;  Laterality: Bilateral;  . MELANOMA EXCISION    . PERIPHERAL VASCULAR BALLOON ANGIOPLASTY Right 12/28/2016   Procedure: PERIPHERAL VASCULAR BALLOON ANGIOPLASTY;  Surgeon: Waynetta Sandy, MD;  Location: [place] CV LAB;  Service: Cardiovascular;  Laterality: Right;  SFA UNSUCCESSFUL PTA  UNABLE TO CROSS LESION  . SKIN LESION EXCISION     multiple  . TEE WITHOUT CARDIOVERSION N/A 11/03/2014   Procedure: TRANSESOPHAGEAL ECHOCARDIOGRAM (TEE);  Surgeon: Gaye Pollack, MD;  Location: [place];  Service: Open Heart Surgery;  Laterality: N/A;    Short Social History:  Social History   Tobacco Use  . Smoking status: Former Smoker    Packs/day: 0.50  Years: 50.00    Pack years: 25.00    Types: Cigarettes    Last attempt to quit: 10/22/2014    Years since quitting: 2.5  . Smokeless tobacco: Never Used  Substance Use Topics  . Alcohol use: No    Alcohol/week: 0.0 oz    Allergies  Allergen Reactions  . Penicillins Anaphylaxis    Has patient had a PCN reaction causing immediate rash, facial/tongue/throat swelling, SOB or lightheadedness with hypotension: Yes Has patient had a PCN reaction causing severe rash involving mucus membranes or skin necrosis: No Has patient had a PCN reaction that required hospitalization: Yes Has patient had a PCN reaction occurring within the last 10  years: Unknown If all of the above answers are "NO", then may proceed with Cephalosporin use.   Marland Kitchen Plavix [Clopidogrel Bisulfate] Hives  . Pletal [Cilostazol] Itching    Current Outpatient Medications  Medication Sig Dispense Refill  . acetaminophen (TYLENOL) 500 MG tablet Take 1,000 mg by mouth daily as needed for moderate pain.    Marland Kitchen allopurinol (ZYLOPRIM) 100 MG tablet Take 1 tablet by mouth daily 90 tablet 0  . ARTIFICIAL TEAR OP Apply 1 drop to eye daily as needed (dry eyes).    Marland Kitchen aspirin EC 81 MG tablet Take 81 mg by mouth daily.    Marland Kitchen atorvastatin (LIPITOR) 40 MG tablet TAKE 1 TABLET EVERY DAY AT 6 PM 90 tablet 0  . Blood Glucose Monitoring Suppl (FREESTYLE FREEDOM LITE) w/Device KIT USE AS DIRECTED TO  CHECK  BLOOD  SUGAR  TWICE  DAILY 1 each 1  . calcium carbonate (TUMS - DOSED IN MG ELEMENTAL CALCIUM) 500 MG chewable tablet Chew 1 tablet by mouth daily as needed for indigestion or heartburn.    . carvedilol (COREG) 3.125 MG tablet TAKE 1 TABLET (3.125 MG TOTAL) BY MOUTH 2 (TWO) TIMES DAILY. 60 tablet 11  . Cholecalciferol (VITAMIN D) 2000 units CAPS Take 2,000 Units by mouth daily.    . Coenzyme Q10 (COQ10) 200 MG CAPS Take 200 mg by mouth daily.    . fenofibrate micronized (ANTARA) 43 MG capsule Take 43 mg by mouth every evening.    . ferrous sulfate 325 (65 FE) MG tablet Take 325 mg by mouth daily.    . finasteride (PROSCAR) 5 MG tablet Take 5 mg by mouth every evening.   3  . FLUAD 0.5 ML SUSY     . folic acid (FOLVITE) 416 MCG tablet Take 400 mcg by mouth daily.    Marland Kitchen glipiZIDE (GLUCOTROL XL) 10 MG 24 hr tablet Take 1 tablet by mouth at bedtime 90 tablet 0  . glucose blood (FREESTYLE LITE) test strip Use as instructed to check blood sugar 2 times per day dx code E11.65 100 each 3  . Homeopathic Products (LEG CRAMP RELIEF) TABS Take 1 tablet by mouth daily as needed (leg cramps).    . hydrocortisone cream 1 % Apply 1 application topically daily as needed (irritation).    .  Lancets (FREESTYLE) lancets Use as instructed to check blood sugar 2 times per day dx code E11.65 100 each 3  . liraglutide (VICTOZA) 18 MG/3ML SOPN Inject 0.3 mLs (1.8 mg total) daily into the skin. 9 mL 3  . lisinopril (PRINIVIL,ZESTRIL) 5 MG tablet TAKE 1 TABLET BY MOUTH EVERY DAY 30 tablet 11  . metFORMIN (GLUCOPHAGE) 1000 MG tablet TAKE 1 TABLET BY MOUTH TWICE A DAY WITH A MEAL 180 tablet 2  . miglitol (GLYSET) 50 MG tablet TAKE  ONE TABLET BY MOUTH TWICE DAILY  180 tablet 0  . multivitamin-lutein (OCUVITE-LUTEIN) CAPS capsule Take 1 capsule by mouth 2 (two) times daily.    . tamsulosin (FLOMAX) 0.4 MG CAPS capsule Take 0.4 mg by mouth 2 (two) times daily.    . vitamin B-12 (CYANOCOBALAMIN) 1000 MCG tablet Take 1,000 mcg by mouth daily.    Marland Kitchen azithromycin (ZITHROMAX) 500 MG tablet     . doxycycline (MONODOX) 100 MG capsule      No current facility-administered medications for this visit.     Review of Systems  Constitutional:  Constitutional negative. HENT: HENT negative.  Eyes: Eyes negative.  Respiratory: Respiratory negative.  Cardiovascular: Cardiovascular negative.  GI: Gastrointestinal negative.  Musculoskeletal: Musculoskeletal negative.  Skin: Positive for wound.  Neurological: Neurological negative. Hematologic: Hematologic/lymphatic negative.  Psychiatric: Psychiatric negative.        Objective:  Objective   Vitals:   04/28/17 1109  BP: (!) 149/84  Pulse: 85  Resp: 18  Temp: (!) 97 F (36.1 C)  TempSrc: Oral  SpO2: 98%  Weight: 162 lb (73.5 kg)  Height: _0  (1.88 m)   Body mass index is 20.8 kg/m.  Physical Exam  Constitutional: He is oriented to person, place, and time. He appears well-developed.  HENT:  Head: Normocephalic.  Eyes: Pupils are equal, round, and reactive to light.  Neck: Normal range of motion.  Cardiovascular: Normal rate.  Pulses:      Radial pulses are 2+ on the right side, and 2+ on the left side.       Femoral pulses are 2+  on the right side, and 2+ on the left side. Strong right pt signal  Pulmonary/Chest: Effort normal.  Abdominal: Soft.  Musculoskeletal: Normal range of motion.  Neurological: He is alert and oriented to person, place, and time.  Skin:  Right medial malleolar ulceration  Psychiatric: He has a normal mood and affect. His behavior is normal. Judgment and thought content normal.    Data: No new studies     Assessment/Plan:     76yo wm here to f/u for wound on right medial malleolus. This is bcc by biopsy and will need Moh's surgery. Hopefully will heal without issues. If he fails to heal wound would need sfa or fem-pop bypass with either small saphenous or arm vein. Will see in 6 months unless wound cannot heal.   Mario [name] C. [name], MD [facility] Office: [phone] Pager: [phone]

## 2017-05-04 NOTE — Addendum Note (Signed)
Addended by: Lianne Cure A on: 05/04/2017 04:56 PM   Modules accepted: Orders

## 2017-05-05 NOTE — Addendum Note (Signed)
Addended by: Lianne Cure A on: 05/05/2017 11:21 AM   Modules accepted: Orders

## 2017-05-08 DIAGNOSIS — L97312 Non-pressure chronic ulcer of right ankle with fat layer exposed: Secondary | ICD-10-CM | POA: Diagnosis not present

## 2017-05-08 DIAGNOSIS — L97822 Non-pressure chronic ulcer of other part of left lower leg with fat layer exposed: Secondary | ICD-10-CM | POA: Diagnosis not present

## 2017-05-08 DIAGNOSIS — L97819 Non-pressure chronic ulcer of other part of right lower leg with unspecified severity: Secondary | ICD-10-CM | POA: Diagnosis not present

## 2017-05-08 DIAGNOSIS — T8189XA Other complications of procedures, not elsewhere classified, initial encounter: Secondary | ICD-10-CM | POA: Diagnosis not present

## 2017-05-24 ENCOUNTER — Other Ambulatory Visit: Payer: Self-pay | Admitting: Endocrinology

## 2017-05-29 ENCOUNTER — Encounter (HOSPITAL_BASED_OUTPATIENT_CLINIC_OR_DEPARTMENT_OTHER): Payer: PPO | Attending: Internal Medicine

## 2017-05-29 DIAGNOSIS — L97312 Non-pressure chronic ulcer of right ankle with fat layer exposed: Secondary | ICD-10-CM | POA: Diagnosis not present

## 2017-05-29 DIAGNOSIS — E1151 Type 2 diabetes mellitus with diabetic peripheral angiopathy without gangrene: Secondary | ICD-10-CM | POA: Diagnosis not present

## 2017-05-29 DIAGNOSIS — L97829 Non-pressure chronic ulcer of other part of left lower leg with unspecified severity: Secondary | ICD-10-CM | POA: Diagnosis not present

## 2017-05-29 DIAGNOSIS — I110 Hypertensive heart disease with heart failure: Secondary | ICD-10-CM | POA: Insufficient documentation

## 2017-05-29 DIAGNOSIS — I509 Heart failure, unspecified: Secondary | ICD-10-CM | POA: Diagnosis not present

## 2017-05-29 DIAGNOSIS — I70233 Atherosclerosis of native arteries of right leg with ulceration of ankle: Secondary | ICD-10-CM | POA: Insufficient documentation

## 2017-05-29 DIAGNOSIS — L97812 Non-pressure chronic ulcer of other part of right lower leg with fat layer exposed: Secondary | ICD-10-CM | POA: Diagnosis not present

## 2017-05-29 DIAGNOSIS — Z85828 Personal history of other malignant neoplasm of skin: Secondary | ICD-10-CM | POA: Diagnosis not present

## 2017-05-29 DIAGNOSIS — L97819 Non-pressure chronic ulcer of other part of right lower leg with unspecified severity: Secondary | ICD-10-CM | POA: Diagnosis not present

## 2017-05-29 DIAGNOSIS — C44712 Basal cell carcinoma of skin of right lower limb, including hip: Secondary | ICD-10-CM | POA: Diagnosis not present

## 2017-06-02 ENCOUNTER — Other Ambulatory Visit: Payer: Self-pay | Admitting: Endocrinology

## 2017-06-05 ENCOUNTER — Other Ambulatory Visit: Payer: Self-pay | Admitting: Dermatology

## 2017-06-05 DIAGNOSIS — D0359 Melanoma in situ of other part of trunk: Secondary | ICD-10-CM | POA: Diagnosis not present

## 2017-06-05 DIAGNOSIS — C44519 Basal cell carcinoma of skin of other part of trunk: Secondary | ICD-10-CM | POA: Diagnosis not present

## 2017-06-05 DIAGNOSIS — C44722 Squamous cell carcinoma of skin of right lower limb, including hip: Secondary | ICD-10-CM | POA: Diagnosis not present

## 2017-06-05 DIAGNOSIS — D0461 Carcinoma in situ of skin of right upper limb, including shoulder: Secondary | ICD-10-CM | POA: Diagnosis not present

## 2017-06-05 DIAGNOSIS — C4359 Malignant melanoma of other part of trunk: Secondary | ICD-10-CM | POA: Diagnosis not present

## 2017-06-05 DIAGNOSIS — L111 Transient acantholytic dermatosis [Grover]: Secondary | ICD-10-CM | POA: Diagnosis not present

## 2017-06-07 ENCOUNTER — Telehealth: Payer: Self-pay | Admitting: Endocrinology

## 2017-06-07 NOTE — Telephone Encounter (Signed)
[facility] is [facility] for script and needs refill asap.  He is completely out.   [id number] [place], [place] - [address]  allopurinol (ZYLOPRIM) 100 MG tablet

## 2017-06-08 ENCOUNTER — Other Ambulatory Visit: Payer: Self-pay

## 2017-06-08 ENCOUNTER — Telehealth: Payer: Self-pay

## 2017-06-08 MED ORDER — ALLOPURINOL 100 MG PO TABS
100.0000 mg | ORAL_TABLET | Freq: Every day | ORAL | 0 refills | Status: DC
Start: 2017-06-08 — End: 2017-08-13

## 2017-06-08 NOTE — Telephone Encounter (Signed)
Patient needs script for Allopurinol 100 Mg-1 x day asap [facility] [place] Patient has been out of above medication for several days- he is worried he will have a gout attack if he does not get his medication very soon

## 2017-06-08 NOTE — Telephone Encounter (Signed)
I called patient to inform him that I have sent in prescription for allopurinol.

## 2017-06-08 NOTE — Telephone Encounter (Signed)
This has been filled 

## 2017-06-13 DIAGNOSIS — C44712 Basal cell carcinoma of skin of right lower limb, including hip: Secondary | ICD-10-CM | POA: Diagnosis not present

## 2017-06-13 DIAGNOSIS — S91001A Unspecified open wound, right ankle, initial encounter: Secondary | ICD-10-CM | POA: Diagnosis not present

## 2017-06-20 DIAGNOSIS — S91001A Unspecified open wound, right ankle, initial encounter: Secondary | ICD-10-CM | POA: Diagnosis not present

## 2017-06-23 ENCOUNTER — Other Ambulatory Visit: Payer: Self-pay | Admitting: Dermatology

## 2017-06-23 DIAGNOSIS — C4359 Malignant melanoma of other part of trunk: Secondary | ICD-10-CM | POA: Diagnosis not present

## 2017-06-23 DIAGNOSIS — L988 Other specified disorders of the skin and subcutaneous tissue: Secondary | ICD-10-CM | POA: Diagnosis not present

## 2017-07-04 DIAGNOSIS — Z85828 Personal history of other malignant neoplasm of skin: Secondary | ICD-10-CM | POA: Diagnosis not present

## 2017-07-04 DIAGNOSIS — S91001A Unspecified open wound, right ankle, initial encounter: Secondary | ICD-10-CM | POA: Diagnosis not present

## 2017-07-05 ENCOUNTER — Other Ambulatory Visit: Payer: Self-pay | Admitting: Endocrinology

## 2017-07-07 ENCOUNTER — Other Ambulatory Visit (INDEPENDENT_AMBULATORY_CARE_PROVIDER_SITE_OTHER): Payer: PPO

## 2017-07-07 ENCOUNTER — Other Ambulatory Visit: Payer: PPO

## 2017-07-07 DIAGNOSIS — E1165 Type 2 diabetes mellitus with hyperglycemia: Secondary | ICD-10-CM | POA: Diagnosis not present

## 2017-07-07 LAB — HEMOGLOBIN A1C: Hgb A1c MFr Bld: 7.6 % — ABNORMAL HIGH (ref 4.6–6.5)

## 2017-07-07 LAB — BASIC METABOLIC PANEL
BUN: 32 mg/dL — ABNORMAL HIGH (ref 6–23)
CO2: 21 mEq/L (ref 19–32)
Calcium: 8.8 mg/dL (ref 8.4–10.5)
Chloride: 108 mEq/L (ref 96–112)
Creatinine, Ser: 1.3 mg/dL (ref 0.40–1.50)
GFR: 57.01 mL/min — ABNORMAL LOW (ref >60.00)
Glucose, Bld: 131 mg/dL — ABNORMAL HIGH (ref 70–99)
Potassium: 4.4 mEq/L (ref 3.5–5.1)
Sodium: 138 mEq/L (ref 135–145)

## 2017-07-09 NOTE — Progress Notes (Signed)
Patient ID: Mario [name], male   DOB: 12/18/1940, 77 y.o.   MRN: [id number]   Reason for Appointment: Diabetes follow-up   History of Present Illness   Diagnosis: Type 2 DIABETES MELITUS, date of diagnosis:  1989     Previous history: He was initially diagnosed when hospitalized for pancreatitis and his previous endocrinologist had treated him with multiple medications because of progression of his diabetes. He was also put on Cycloset  which he has tolerated maximum dose He has had adjustments of his medication dosages the last couple of years and Januvia was restarted in 5/14 when blood sugars were higher. Dosages have been adjusted based on renal function Previously an A1c levels have been ranging from 6.6-7.5 His metformin was increased  when renal function was better and glipizide was changed to glipizide ER 10 mg . Did not appear to have better blood sugar control with Invokana He was started on Victoza for improved control when his A1c was 7.9% in 02/2015  Recent history:   Non-insulin hypoglycemic drugs:  Glyset 50 mg bid, glipizide ER 10 mg , Metformin 1 g twice a day,   Victoza 1.8 mg daily        His A1c is  relatively higher at 7.6 compared to 7.3  Blood sugar patterns and problems:  He has only checked blood sugars 4 times recently  Not clear when he has high readings as there is no consistent pattern, most readings are in the mornings or before noon  Occasionally he will go off his diet and eat donuts but otherwise generally watches his diet  He thinks that his sugars maybe higher because of lack of exercise  Has no nausea with taking 1.8 mg Victoza, this was increased because of tendency to higher postprandial readings  He will take his Glyset right before eating with breakfast and supper, occasionally may forget  Side effects from medications: None.  Monitors blood glucose: Once a day or less .    Glucometer: Freestyle    Blood Glucose readings  from ordered for download: He has only 4 readings for the last 2 weeks  Recent range 121-204  Meals: 2-3 meals per day, no lunch often (yogurt); breakfast is at 9 am cereal, [place] bacon, sometimes bread and eggs at breakfast; donuts occasionally      Dietician consultation last: 3/16        Physical activity: exercise:  Not going to the gym 0-3/7,Doing various exercises      Wt Readings from Last 3 Encounters:  07/10/17 167 lb (75.8 kg)  04/28/17 162 lb (73.5 kg)  04/07/17 164 lb (74.4 kg)    Lab Results  Component Value Date   HGBA1C 7.6 (H) 07/07/2017   HGBA1C 7.3 (H) 04/04/2017   HGBA1C 7.5 (H) 12/30/2016   Lab Results  Component Value Date   MICROALBUR 11.7 (H) 04/07/2017   LDLCALC 38 12/30/2016   CREATININE 1.30 07/07/2017     LABS:  Lab on 07/07/2017  Component Date Value Ref Range Status  . Sodium 07/07/2017 138  135 - 145 mEq/L Final  . Potassium 07/07/2017 4.4  3.5 - 5.1 mEq/L Final  . Chloride 07/07/2017 108  96 - 112 mEq/L Final  . CO2 07/07/2017 21  19 - 32 mEq/L Final  . Glucose, Bld 07/07/2017 131* 70 - 99 mg/dL Final  . BUN 07/07/2017 32* 6 - 23 mg/dL Final  . Creatinine, Ser 07/07/2017 1.30  0.40 - 1.50 mg/dL Final  .  Calcium 07/07/2017 8.8  8.4 - 10.5 mg/dL Final  . GFR 07/07/2017 57.01* >60.00 mL/min Final  . Hgb A1c MFr Bld 07/07/2017 7.6* 4.6 - 6.5 % Final   Glycemic Control Guidelines for People with Diabetes:Non Diabetic:  <6%Goal of Therapy: <7%Additional Action Suggested:  >8%     Allergies as of 07/10/2017      Reactions   Penicillins Anaphylaxis   Has patient had a PCN reaction causing immediate rash, facial/tongue/throat swelling, SOB or lightheadedness with hypotension: Yes Has patient had a PCN reaction causing severe rash involving mucus membranes or skin necrosis: No Has patient had a PCN reaction that required hospitalization: Yes Has patient had a PCN reaction occurring within the last 10 years: Unknown If all of the above  answers are "NO", then may proceed with Cephalosporin use.   Plavix [clopidogrel Bisulfate] Hives   Pletal [cilostazol] Itching      Medication List        Accurate as of 07/10/17 10:38 AM. Always use your most recent med list.          acetaminophen 500 MG tablet Commonly known as:  TYLENOL Take 1,000 mg by mouth daily as needed for moderate pain.   allopurinol 100 MG tablet Commonly known as:  ZYLOPRIM Take 1 tablet (100 mg total) by mouth daily.   ARTIFICIAL TEAR OP Apply 1 drop to eye daily as needed (dry eyes).   aspirin EC 81 MG tablet Take 81 mg by mouth daily.   atorvastatin 40 MG tablet Commonly known as:  LIPITOR TAKE ONE TABLET BY MOUTH ONE TIME DAILY AT 6PM   azithromycin 500 MG tablet Commonly known as:  ZITHROMAX   calcium carbonate 500 MG chewable tablet Commonly known as:  TUMS - dosed in mg elemental calcium Chew 1 tablet by mouth daily as needed for indigestion or heartburn.   carvedilol 3.125 MG tablet Commonly known as:  COREG TAKE 1 TABLET (3.125 MG TOTAL) BY MOUTH 2 (TWO) TIMES DAILY.   CoQ10 200 MG Caps Take 200 mg by mouth daily.   doxycycline 100 MG capsule Commonly known as:  MONODOX   fenofibrate micronized 43 MG capsule Commonly known as:  ANTARA Take 43 mg by mouth every evening.   ferrous sulfate 325 (65 FE) MG tablet Take 325 mg by mouth daily.   finasteride 5 MG tablet Commonly known as:  PROSCAR Take 5 mg by mouth every evening.   FLUAD 0.5 ML Susy Generic drug:  Influenza Vac A&B Surf Ant Adj   folic acid 505 MCG tablet Commonly known as:  FOLVITE Take 400 mcg by mouth daily.   FREESTYLE FREEDOM LITE w/Device Kit USE AS DIRECTED TO  CHECK  BLOOD  SUGAR  TWICE  DAILY   freestyle lancets Use as instructed to check blood sugar 2 times per day dx code E11.65   glipiZIDE 10 MG 24 hr tablet Commonly known as:  GLUCOTROL XL Take 1 tablet by mouth at bedtime   glucose blood test strip Commonly known as:  FREESTYLE  LITE Use as instructed to check blood sugar 2 times per day dx code E11.65   hydrocortisone cream 1 % Apply 1 application topically daily as needed (irritation).   LEG CRAMP RELIEF Tabs Take 1 tablet by mouth daily as needed (leg cramps).   liraglutide 18 MG/3ML Sopn Commonly known as:  VICTOZA Inject 0.3 mLs (1.8 mg total) daily into the skin.   lisinopril 5 MG tablet Commonly known as:  PRINIVIL,ZESTRIL TAKE 1  TABLET BY MOUTH EVERY DAY   metFORMIN 1000 MG tablet Commonly known as:  GLUCOPHAGE TAKE 1 TABLET BY MOUTH TWICE A DAY WITH A MEAL   miglitol 50 MG tablet Commonly known as:  GLYSET TAKE ONE TABLET BY MOUTH TWICE DAILY   multivitamin-lutein Caps capsule Take 1 capsule by mouth 2 (two) times daily.   tamsulosin 0.4 MG Caps capsule Commonly known as:  FLOMAX Take 0.4 mg by mouth 2 (two) times daily.   vitamin B-12 1000 MCG tablet Commonly known as:  CYANOCOBALAMIN Take 1,000 mcg by mouth daily.   Vitamin D 2000 units Caps Take 2,000 Units by mouth daily.       Allergies:  Allergies  Allergen Reactions  . Penicillins Anaphylaxis    Has patient had a PCN reaction causing immediate rash, facial/tongue/throat swelling, SOB or lightheadedness with hypotension: Yes Has patient had a PCN reaction causing severe rash involving mucus membranes or skin necrosis: No Has patient had a PCN reaction that required hospitalization: Yes Has patient had a PCN reaction occurring within the last 10 years: Unknown If all of the above answers are "NO", then may proceed with Cephalosporin use.   Marland Kitchen Plavix [Clopidogrel Bisulfate] Hives  . Pletal [Cilostazol] Itching    Past Medical History:  Diagnosis Date  . Arthritis   . CAD (coronary artery disease)    a.  s/p IMI and BMS 1997;  b. Myoview 4/16:  inf scar with peri-infarct ischemia, EF 34%; high risk;  c. LHC 5/16:  3 v CAD >> CABG (free L-LAD, S-OM, S-dRCA)  . Carotid stenosis    a. carotid US 6/16:  bilat ICA 1-39%  .  Chronic kidney disease, stage II (mild)   . Chronic systolic CHF (congestive heart failure) ([place])   . Essential hypertension, benign   . Gout   . HLD (hyperlipidemia)   . Ischemic cardiomyopathy    a. EF by myoview 4/16 34%; b. LHC 5/16: EF 40-45%;  c. intraop TEE 6/16: EF 45-50%  . Orthostasis   . PAD (peripheral artery disease) ([facility])    Dr. Fletcher Anon  . Pneumonia    hx of  . Type II or unspecified type diabetes mellitus without mention of complication, uncontrolled     Past Surgical History:  Procedure Laterality Date  . ABDOMINAL AORTAGRAM N/A 10/09/2013   Procedure: ABDOMINAL Maxcine Ham;  Surgeon: Wellington Hampshire, MD;  Location: [facility] CATH LAB;  Service: Cardiovascular;  Laterality: N/A;  . ABDOMINAL AORTOGRAM N/A 12/28/2016   Procedure: ABDOMINAL AORTOGRAM;  Surgeon: Waynetta Sandy, MD;  Location: [place] [facility];  Service: Cardiovascular;  Laterality: N/A;  . CARDIAC CATHETERIZATION N/A 10/02/2014   Procedure: Left Heart Cath and Coronary Angiography;  Surgeon: Belva Crome, MD;  Location: [facility];  Service: Cardiovascular;  Laterality: N/A;  . CATARACT EXTRACTION    . CHOLECYSTECTOMY    . COLONOSCOPY    . CORONARY ARTERY BYPASS GRAFT N/A 11/03/2014   Procedure: CORONARY ARTERY BYPASS GRAFTING  times three using left internal mammary and right greater saphenous vein;  Surgeon: Gaye Pollack, MD;  Location: [facility] OR;  Service: Open Heart Surgery;  Laterality: N/A;  . HERNIA REPAIR    . LOWER EXTREMITY ANGIOGRAPHY Bilateral 12/28/2016   Procedure: Lower Extremity Angiography;  Surgeon: Waynetta Sandy, MD;  Location: [facility];  Service: Cardiovascular;  Laterality: Bilateral;  . MELANOMA EXCISION    . PERIPHERAL VASCULAR BALLOON ANGIOPLASTY Right 12/28/2016   Procedure: PERIPHERAL VASCULAR  BALLOON ANGIOPLASTY;  Surgeon: Waynetta Sandy, MD;  Location: [facility];  Service: Cardiovascular;  Laterality: Right;  SFA UNSUCCESSFUL PTA   UNABLE TO CROSS LESION  . SKIN LESION EXCISION     multiple  . TEE WITHOUT CARDIOVERSION N/A 11/03/2014   Procedure: TRANSESOPHAGEAL ECHOCARDIOGRAM (TEE);  Surgeon: Gaye Pollack, MD;  Location: [facility];  Service: Open Heart Surgery;  Laterality: N/A;    Family History  Problem Relation Age of Onset  . Colon cancer Father   . Heart disease Mother     Social History:  reports that he quit smoking about 2 years ago. His smoking use included cigarettes. He has a 25.00 pack-year smoking history. he has never used smokeless tobacco. He reports that he does not drink alcohol or use drugs.  Review of Systems:   Hypertension:  taking lisinopril 5 mg and carvedilol low-dose and blood pressure is well-controlled, managed by cardiologist  Lipids: Well controlled with atorvastatin 40 mg .  HDL is  usually low, triglycerides normal   Lab Results  Component Value Date   CHOL 93 12/30/2016   HDL 35.10 (L) 12/30/2016   LDLCALC 38 12/30/2016   TRIG 100.0 12/30/2016   CHOLHDL 3 12/30/2016    His leg ulcer apparently is skin cancer and has been treated, recently had a skin graft which is painful  He is taking Advil 4 times a day for leg pain, apparently did not get enough tramadol from the surgeon   Examination:   BP 118/76 (Patient Position: Sitting)   Pulse 76   Temp 97.6 F (36.4 C) (Oral)   Resp 16   Ht 6' 1.5" (1.867 m)   Wt 167 lb (75.8 kg)   SpO2 99%   BMI 21.73 kg/m   Body mass index is 21.73 kg/m.     ASSESSMENT/ PLAN:   Diabetes type 2:  See history of present illness for detailed discussion of his current management, blood sugar patterns and problems identified  Blood glucose control is not as good with A1c 7.6 However due to lack of adequate monitoring not clear when his blood sugars are going up He is already on 1.8 Victoza and multiple other drugs  He does usually do better when he is exercising regularly and has not been able to do this Also periodically will  go off the diet with eating foods like doughnuts  For now he will continue the same medications However reminded him to check blood sugars more often especially after meals Resume exercise when able to  Try to minimize use of Advil and he will check with the surgeon about getting more tramadol  We will see him back in 3 months with another A1c   Patient Instructions  Check blood sugars on waking up 2/7 days   Also check blood sugars about 2 hours after a meal and do this after different meals by rotation  Recommended blood sugar levels on waking up is 90-130 and about 2 hours after meal is 130-180  Please bring your blood sugar monitor to each visit, thank you     [name] 07/10/2017, 10:38 AM

## 2017-07-10 ENCOUNTER — Ambulatory Visit (INDEPENDENT_AMBULATORY_CARE_PROVIDER_SITE_OTHER): Payer: PPO | Admitting: Endocrinology

## 2017-07-10 ENCOUNTER — Encounter: Payer: Self-pay | Admitting: Endocrinology

## 2017-07-10 VITALS — BP 118/76 | HR 76 | Temp 97.6°F | Resp 16 | Ht 73.5 in | Wt 167.0 lb

## 2017-07-10 DIAGNOSIS — E1165 Type 2 diabetes mellitus with hyperglycemia: Secondary | ICD-10-CM

## 2017-07-10 NOTE — Patient Instructions (Signed)
Check blood sugars on waking up 2/7 days   Also check blood sugars about 2 hours after a meal and do this after different meals by rotation  Recommended blood sugar levels on waking up is 90-130 and about 2 hours after meal is 130-180  Please bring your blood sugar monitor to each visit, thank you

## 2017-07-11 DIAGNOSIS — S81801A Unspecified open wound, right lower leg, initial encounter: Secondary | ICD-10-CM | POA: Diagnosis not present

## 2017-07-11 DIAGNOSIS — Z4801 Encounter for change or removal of surgical wound dressing: Secondary | ICD-10-CM | POA: Diagnosis not present

## 2017-07-13 ENCOUNTER — Other Ambulatory Visit: Payer: Self-pay | Admitting: Dermatology

## 2017-07-13 DIAGNOSIS — D0461 Carcinoma in situ of skin of right upper limb, including shoulder: Secondary | ICD-10-CM | POA: Diagnosis not present

## 2017-07-13 DIAGNOSIS — C44519 Basal cell carcinoma of skin of other part of trunk: Secondary | ICD-10-CM | POA: Diagnosis not present

## 2017-07-18 DIAGNOSIS — Z4801 Encounter for change or removal of surgical wound dressing: Secondary | ICD-10-CM | POA: Diagnosis not present

## 2017-07-18 DIAGNOSIS — S91001A Unspecified open wound, right ankle, initial encounter: Secondary | ICD-10-CM | POA: Diagnosis not present

## 2017-07-26 ENCOUNTER — Telehealth: Payer: Self-pay | Admitting: Endocrinology

## 2017-07-26 NOTE — Telephone Encounter (Signed)
Mario [name] from [place] is requesting a prescription for  Blood Glucose Monitoring Suppl (FREESTYLE FREEDOM LITE) w/Device KIT [[id number]]   [facility] [phone]

## 2017-07-27 ENCOUNTER — Telehealth: Payer: Self-pay | Admitting: Endocrinology

## 2017-07-27 NOTE — Telephone Encounter (Signed)
Patient stated pharmacy is waiting for drs auth for this meter. States he's been waiting for this for 4 days.  Please advise  Blood Glucose Monitoring Suppl (FREESTYLE FREEDOM LITE) w/Device [place], [place] [address]

## 2017-07-28 ENCOUNTER — Other Ambulatory Visit: Payer: Self-pay

## 2017-07-31 ENCOUNTER — Other Ambulatory Visit: Payer: Self-pay

## 2017-07-31 MED ORDER — FREESTYLE FREEDOM LITE W/DEVICE KIT: PACK | 1 refills | Status: DC

## 2017-07-31 NOTE — Telephone Encounter (Signed)
Done

## 2017-08-08 ENCOUNTER — Other Ambulatory Visit: Payer: Self-pay

## 2017-08-08 MED ORDER — FREESTYLE LITE DEVI: 1 refills | Status: DC

## 2017-08-08 NOTE — Telephone Encounter (Signed)
Done

## 2017-08-13 ENCOUNTER — Other Ambulatory Visit: Payer: Self-pay | Admitting: Endocrinology

## 2017-08-15 DIAGNOSIS — S91001A Unspecified open wound, right ankle, initial encounter: Secondary | ICD-10-CM | POA: Diagnosis not present

## 2017-08-15 DIAGNOSIS — Z4801 Encounter for change or removal of surgical wound dressing: Secondary | ICD-10-CM | POA: Diagnosis not present

## 2017-08-28 DIAGNOSIS — M79671 Pain in right foot: Secondary | ICD-10-CM | POA: Diagnosis not present

## 2017-08-28 DIAGNOSIS — L84 Corns and callosities: Secondary | ICD-10-CM | POA: Diagnosis not present

## 2017-08-28 DIAGNOSIS — E119 Type 2 diabetes mellitus without complications: Secondary | ICD-10-CM | POA: Diagnosis not present

## 2017-08-28 DIAGNOSIS — B351 Tinea unguium: Secondary | ICD-10-CM | POA: Diagnosis not present

## 2017-08-28 DIAGNOSIS — M79672 Pain in left foot: Secondary | ICD-10-CM | POA: Diagnosis not present

## 2017-09-11 ENCOUNTER — Other Ambulatory Visit: Payer: Self-pay | Admitting: Endocrinology

## 2017-09-12 DIAGNOSIS — S91001A Unspecified open wound, right ankle, initial encounter: Secondary | ICD-10-CM | POA: Diagnosis not present

## 2017-09-13 DIAGNOSIS — N183 Chronic kidney disease, stage 3 (moderate): Secondary | ICD-10-CM | POA: Diagnosis not present

## 2017-09-13 DIAGNOSIS — E7800 Pure hypercholesterolemia, unspecified: Secondary | ICD-10-CM | POA: Diagnosis not present

## 2017-09-13 DIAGNOSIS — I779 Disorder of arteries and arterioles, unspecified: Secondary | ICD-10-CM | POA: Diagnosis not present

## 2017-09-13 DIAGNOSIS — Z1389 Encounter for screening for other disorder: Secondary | ICD-10-CM | POA: Diagnosis not present

## 2017-09-13 DIAGNOSIS — I739 Peripheral vascular disease, unspecified: Secondary | ICD-10-CM | POA: Diagnosis not present

## 2017-09-13 DIAGNOSIS — I2510 Atherosclerotic heart disease of native coronary artery without angina pectoris: Secondary | ICD-10-CM | POA: Diagnosis not present

## 2017-09-13 DIAGNOSIS — I10 Essential (primary) hypertension: Secondary | ICD-10-CM | POA: Diagnosis not present

## 2017-09-13 DIAGNOSIS — E1151 Type 2 diabetes mellitus with diabetic peripheral angiopathy without gangrene: Secondary | ICD-10-CM | POA: Diagnosis not present

## 2017-09-13 DIAGNOSIS — Z0000 Encounter for general adult medical examination without abnormal findings: Secondary | ICD-10-CM | POA: Diagnosis not present

## 2017-09-13 DIAGNOSIS — E11319 Type 2 diabetes mellitus with unspecified diabetic retinopathy without macular edema: Secondary | ICD-10-CM | POA: Diagnosis not present

## 2017-09-13 DIAGNOSIS — N401 Enlarged prostate with lower urinary tract symptoms: Secondary | ICD-10-CM | POA: Diagnosis not present

## 2017-09-19 DIAGNOSIS — L03032 Cellulitis of left toe: Secondary | ICD-10-CM | POA: Diagnosis not present

## 2017-09-19 DIAGNOSIS — E119 Type 2 diabetes mellitus without complications: Secondary | ICD-10-CM | POA: Diagnosis not present

## 2017-09-27 ENCOUNTER — Other Ambulatory Visit: Payer: Self-pay | Admitting: Endocrinology

## 2017-10-02 ENCOUNTER — Other Ambulatory Visit: Payer: PPO

## 2017-10-02 DIAGNOSIS — Z5181 Encounter for therapeutic drug level monitoring: Secondary | ICD-10-CM | POA: Diagnosis not present

## 2017-10-02 DIAGNOSIS — C44722 Squamous cell carcinoma of skin of right lower limb, including hip: Secondary | ICD-10-CM | POA: Diagnosis not present

## 2017-10-03 ENCOUNTER — Other Ambulatory Visit (INDEPENDENT_AMBULATORY_CARE_PROVIDER_SITE_OTHER): Payer: PPO

## 2017-10-03 DIAGNOSIS — E1165 Type 2 diabetes mellitus with hyperglycemia: Secondary | ICD-10-CM | POA: Diagnosis not present

## 2017-10-03 LAB — COMPREHENSIVE METABOLIC PANEL
ALT: 37 U/L (ref 0–53)
AST: 27 U/L (ref 0–37)
Albumin: 3.9 g/dL (ref 3.5–5.2)
Alkaline Phosphatase: 62 U/L (ref 39–117)
BUN: 25 mg/dL — ABNORMAL HIGH (ref 6–23)
CO2: 23 mEq/L (ref 19–32)
Calcium: 9.1 mg/dL (ref 8.4–10.5)
Chloride: 111 mEq/L (ref 96–112)
Creatinine, Ser: 1.34 mg/dL (ref 0.40–1.50)
GFR: 55.01 mL/min — ABNORMAL LOW (ref >60.00)
Glucose, Bld: 145 mg/dL — ABNORMAL HIGH (ref 70–99)
Potassium: 4.6 mEq/L (ref 3.5–5.1)
Sodium: 140 mEq/L (ref 135–145)
Total Bilirubin: 0.8 mg/dL (ref 0.2–1.2)
Total Protein: 6.3 g/dL (ref 6.0–8.3)

## 2017-10-03 LAB — LIPID PANEL
Cholesterol: 117 mg/dL (ref 0–200)
HDL: 35.2 mg/dL — ABNORMAL LOW (ref >39.00)
LDL Cholesterol: 48 mg/dL (ref 0–99)
NonHDL: 81.43
Total CHOL/HDL Ratio: 3
Triglycerides: 168 mg/dL — ABNORMAL HIGH (ref 0.0–149.0)
VLDL: 33.6 mg/dL (ref 0.0–40.0)

## 2017-10-03 LAB — HEMOGLOBIN A1C: Hgb A1c MFr Bld: 7.4 % — ABNORMAL HIGH (ref 4.6–6.5)

## 2017-10-05 ENCOUNTER — Encounter: Payer: Self-pay | Admitting: Endocrinology

## 2017-10-05 ENCOUNTER — Ambulatory Visit: Payer: PPO | Admitting: Endocrinology

## 2017-10-05 VITALS — BP 140/80 | HR 77 | Ht 73.5 in | Wt 168.4 lb

## 2017-10-05 DIAGNOSIS — E1165 Type 2 diabetes mellitus with hyperglycemia: Secondary | ICD-10-CM | POA: Diagnosis not present

## 2017-10-05 NOTE — Progress Notes (Signed)
Patient ID: Mario [name], male   DOB: 29-Jun-1940, 77 y.o.   MRN: [id number]   Reason for Appointment: Diabetes follow-up   History of Present Illness   Diagnosis: Type 2 DIABETES MELITUS, date of diagnosis:  1989     Previous history: He was initially diagnosed when hospitalized for pancreatitis and his previous endocrinologist had treated him with multiple medications because of progression of his diabetes. He was also put on Cycloset  which he has tolerated maximum dose He has had adjustments of his medication dosages the last couple of years and Januvia was restarted in 5/14 when blood sugars were higher. Dosages have been adjusted based on renal function Previously an A1c levels have been ranging from 6.6-7.5 His metformin was increased  when renal function was better and glipizide was changed to glipizide ER 10 mg . Did not appear to have better blood sugar control with Invokana He was started on Victoza for improved control when his A1c was 7.9% in 02/2015  Recent history:   Non-insulin hypoglycemic drugs:  Glyset 50 mg bid, glipizide ER 10 mg , Metformin 1 g twice a day,   Victoza 1.8 mg daily       His A1c is  relatively better at 6.4 compared to 7.6 and generally stable   Blood sugar patterns and problems:  He has gone back to exercising over the last month or so and may be improving his sugar  However difficult to know what his blood sugar patterns are since he checks sugar sporadically and usually somewhere around midday  Does have a couple of high readings probably after meals  Fasting blood sugar 145 in the lab  Been fairly consistent with taking all his medications including Glyset before breakfast and suppertime  However he may have some irregular snacks in the middle of the day instead of lunch  Weight is stable  Side effects from medications: None.  Monitors blood glucose: Once a day or less .    Glucometer: Freestyle     Blood Glucose  readings from monitor download:  Recent range 121-229 with AVERAGE 151 Taking mostly between 10 AM and 2 PM today which readings are postprandial Morning  Meals: 2-3 meals per day, no lunch often (yogurt); breakfast is at 9 am cereal, [place] bacon, sometimes bread and eggs at breakfast; donuts occasionally      Dietician consultation last: 3/16        Physical activity: exercise: going to the gym 3+/7,Doing various exercises      Wt Readings from Last 3 Encounters:  10/05/17 168 lb 6.4 oz (76.4 kg)  07/10/17 167 lb (75.8 kg)  04/28/17 162 lb (73.5 kg)    Lab Results  Component Value Date   HGBA1C 7.4 (H) 10/03/2017   HGBA1C 7.6 (H) 07/07/2017   HGBA1C 7.3 (H) 04/04/2017   Lab Results  Component Value Date   MICROALBUR 11.7 (H) 04/07/2017   LDLCALC 48 10/03/2017   CREATININE 1.34 10/03/2017     LABS:  Lab on 10/03/2017  Component Date Value Ref Range Status  . Cholesterol 10/03/2017 117  0 - 200 mg/dL Final   ATP III Classification       Desirable:  < 200 mg/dL               Borderline High:  200 - 239 mg/dL          High:  > = 240 mg/dL  . Triglycerides 10/03/2017 168.0* 0.0 - 149.0 mg/dL  Final   Normal:  <150 mg/dLBorderline High:  150 - 199 mg/dL  . HDL 10/03/2017 35.20* >39.00 mg/dL Final  . VLDL 10/03/2017 33.6  0.0 - 40.0 mg/dL Final  . LDL Cholesterol 10/03/2017 48  0 - 99 mg/dL Final  . Total CHOL/HDL Ratio 10/03/2017 3   Final                  Men          Women1/2 Average Risk     3.4          3.3Average Risk          5.0          4.42X Average Risk          9.6          7.13X Average Risk          15.0          11.0                      . NonHDL 10/03/2017 81.43   Final   NOTE:  Non-HDL goal should be 30 mg/dL higher than patient's LDL goal (i.e. LDL goal of < 70 mg/dL, would have non-HDL goal of < 100 mg/dL)  . Sodium 10/03/2017 140  135 - 145 mEq/L Final  . Potassium 10/03/2017 4.6  3.5 - 5.1 mEq/L Final  . Chloride 10/03/2017 111  96 - 112 mEq/L Final    . CO2 10/03/2017 23  19 - 32 mEq/L Final  . Glucose, Bld 10/03/2017 145* 70 - 99 mg/dL Final  . BUN 10/03/2017 25* 6 - 23 mg/dL Final  . Creatinine, Ser 10/03/2017 1.34  0.40 - 1.50 mg/dL Final  . Total Bilirubin 10/03/2017 0.8  0.2 - 1.2 mg/dL Final  . Alkaline Phosphatase 10/03/2017 62  39 - 117 U/L Final  . AST 10/03/2017 27  0 - 37 U/L Final  . ALT 10/03/2017 37  0 - 53 U/L Final  . Total Protein 10/03/2017 6.3  6.0 - 8.3 g/dL Final  . Albumin 10/03/2017 3.9  3.5 - 5.2 g/dL Final  . Calcium 10/03/2017 9.1  8.4 - 10.5 mg/dL Final  . GFR 10/03/2017 55.01* >60.00 mL/min Final  . Hgb A1c MFr Bld 10/03/2017 7.4* 4.6 - 6.5 % Final   Glycemic Control Guidelines for People with Diabetes:Non Diabetic:  <6%Goal of Therapy: <7%Additional Action Suggested:  >8%     Allergies as of 10/05/2017      Reactions   Penicillins Anaphylaxis   Has patient had a PCN reaction causing immediate rash, facial/tongue/throat swelling, SOB or lightheadedness with hypotension: Yes Has patient had a PCN reaction causing severe rash involving mucus membranes or skin necrosis: No Has patient had a PCN reaction that required hospitalization: Yes Has patient had a PCN reaction occurring within the last 10 years: Unknown If all of the above answers are "NO", then may proceed with Cephalosporin use.   Plavix [clopidogrel Bisulfate] Hives   Pletal [cilostazol] Itching      Medication List        Accurate as of 10/05/17 11:18 AM. Always use your most recent med list.          acetaminophen 500 MG tablet Commonly known as:  TYLENOL Take 1,000 mg by mouth daily as needed for moderate pain.   allopurinol 100 MG tablet Commonly known as:  ZYLOPRIM Take 1 tablet by mouth daily   ARTIFICIAL TEAR OP  Apply 1 drop to eye daily as needed (dry eyes).   aspirin EC 81 MG tablet Take 81 mg by mouth daily.   atorvastatin 40 MG tablet Commonly known as:  LIPITOR TAKE 1 TABLET BY MOUTH DAILY AT 6PM   azithromycin  500 MG tablet Commonly known as:  ZITHROMAX   calcium carbonate 500 MG chewable tablet Commonly known as:  TUMS - dosed in mg elemental calcium Chew 1 tablet by mouth daily as needed for indigestion or heartburn.   carvedilol 3.125 MG tablet Commonly known as:  COREG TAKE 1 TABLET (3.125 MG TOTAL) BY MOUTH 2 (TWO) TIMES DAILY.   CoQ10 200 MG Caps Take 200 mg by mouth daily.   doxycycline 100 MG capsule Commonly known as:  MONODOX   fenofibrate micronized 43 MG capsule Commonly known as:  ANTARA Take 43 mg by mouth every evening.   ferrous sulfate 325 (65 FE) MG tablet Take 325 mg by mouth daily.   finasteride 5 MG tablet Commonly known as:  PROSCAR Take 5 mg by mouth every evening.   FLUAD 0.5 ML Susy Generic drug:  Influenza Vac A&B Surf Ant Adj   folic acid 024 MCG tablet Commonly known as:  FOLVITE Take 400 mcg by mouth daily.   freestyle lancets Use as instructed to check blood sugar 2 times per day dx code E11.65   FREESTYLE LITE Devi USE TO TEST BLOOD SUGAR TWICE DAILY   glipiZIDE 10 MG 24 hr tablet Commonly known as:  GLUCOTROL XL Take 1 tablet by mouth at bedtime   glucose blood test strip Commonly known as:  FREESTYLE LITE Use as instructed to check blood sugar 2 times per day dx code E11.65   hydrocortisone cream 1 % Apply 1 application topically daily as needed (irritation).   LEG CRAMP RELIEF Tabs Take 1 tablet by mouth daily as needed (leg cramps).   liraglutide 18 MG/3ML Sopn Commonly known as:  VICTOZA Inject 0.3 mLs (1.8 mg total) daily into the skin.   lisinopril 5 MG tablet Commonly known as:  PRINIVIL,ZESTRIL TAKE 1 TABLET BY MOUTH EVERY DAY   metFORMIN 1000 MG tablet Commonly known as:  GLUCOPHAGE TAKE 1 TABLET BY MOUTH TWICE A DAY WITH A FULL MEAL   miglitol 50 MG tablet Commonly known as:  GLYSET TAKE ONE TABLET BY MOUTH TWICE DAILY   multivitamin-lutein Caps capsule Take 1 capsule by mouth 2 (two) times daily.     tamsulosin 0.4 MG Caps capsule Commonly known as:  FLOMAX Take 0.4 mg by mouth 2 (two) times daily.   vitamin B-12 1000 MCG tablet Commonly known as:  CYANOCOBALAMIN Take 1,000 mcg by mouth daily.   Vitamin D 2000 units Caps Take 2,000 Units by mouth daily.       Allergies:  Allergies  Allergen Reactions  . Penicillins Anaphylaxis    Has patient had a PCN reaction causing immediate rash, facial/tongue/throat swelling, SOB or lightheadedness with hypotension: Yes Has patient had a PCN reaction causing severe rash involving mucus membranes or skin necrosis: No Has patient had a PCN reaction that required hospitalization: Yes Has patient had a PCN reaction occurring within the last 10 years: Unknown If all of the above answers are "NO", then may proceed with Cephalosporin use.   Marland Kitchen Plavix [Clopidogrel Bisulfate] Hives  . Pletal [Cilostazol] Itching    Past Medical History:  Diagnosis Date  . Arthritis   . CAD (coronary artery disease)    a.  s/p IMI and BMS  1997;  b. Myoview 4/16:  inf scar with peri-infarct ischemia, EF 34%; high risk;  c. LHC 5/16:  3 v CAD >> CABG (free L-LAD, S-OM, S-dRCA)  . Carotid stenosis    a. carotid US 6/16:  bilat ICA 1-39%  . Chronic kidney disease, stage II (mild)   . Chronic systolic CHF (congestive heart failure) ([facility])   . Essential hypertension, benign   . Gout   . HLD (hyperlipidemia)   . Ischemic cardiomyopathy    a. EF by myoview 4/16 34%; b. LHC 5/16: EF 40-45%;  c. intraop TEE 6/16: EF 45-50%  . Orthostasis   . PAD (peripheral artery disease) ([facility])    Dr. Fletcher Anon  . Pneumonia    hx of  . Type II or unspecified type diabetes mellitus without mention of complication, uncontrolled     Past Surgical History:  Procedure Laterality Date  . ABDOMINAL AORTAGRAM N/A 10/09/2013   Procedure: ABDOMINAL Maxcine Ham;  Surgeon: Wellington Hampshire, MD;  Location: [facility];  Service: Cardiovascular;  Laterality: N/A;  . ABDOMINAL AORTOGRAM N/A  12/28/2016   Procedure: ABDOMINAL AORTOGRAM;  Surgeon: Waynetta Sandy, MD;  Location: [facility];  Service: Cardiovascular;  Laterality: N/A;  . CARDIAC CATHETERIZATION N/A 10/02/2014   Procedure: Left Heart Cath and Coronary Angiography;  Surgeon: Belva Crome, MD;  Location: [facility];  Service: Cardiovascular;  Laterality: N/A;  . CATARACT EXTRACTION    . CHOLECYSTECTOMY    . COLONOSCOPY    . CORONARY ARTERY BYPASS GRAFT N/A 11/03/2014   Procedure: CORONARY ARTERY BYPASS GRAFTING  times three using left internal mammary and right greater saphenous vein;  Surgeon: Gaye Pollack, MD;  Location: [facility];  Service: Open Heart Surgery;  Laterality: N/A;  . HERNIA REPAIR    . LOWER EXTREMITY ANGIOGRAPHY Bilateral 12/28/2016   Procedure: Lower Extremity Angiography;  Surgeon: Waynetta Sandy, MD;  Location: [facility];  Service: Cardiovascular;  Laterality: Bilateral;  . MELANOMA EXCISION    . PERIPHERAL VASCULAR BALLOON ANGIOPLASTY Right 12/28/2016   Procedure: PERIPHERAL VASCULAR BALLOON ANGIOPLASTY;  Surgeon: Waynetta Sandy, MD;  Location: [facility];  Service: Cardiovascular;  Laterality: Right;  SFA UNSUCCESSFUL PTA  UNABLE TO CROSS LESION  . SKIN LESION EXCISION     multiple  . TEE WITHOUT CARDIOVERSION N/A 11/03/2014   Procedure: TRANSESOPHAGEAL ECHOCARDIOGRAM (TEE);  Surgeon: Gaye Pollack, MD;  Location: [facility];  Service: Open Heart Surgery;  Laterality: N/A;    Family History  Problem Relation Age of Onset  . Colon cancer Father   . Heart disease Mother     Social History:  reports that he quit smoking about 2 years ago. His smoking use included cigarettes. He has a 25.00 pack-year smoking history. He has never used smokeless tobacco. He reports that he does not drink alcohol or use drugs.  Review of Systems:   Hypertension:  taking lisinopril 5 mg and carvedilol low-dose and blood pressure is  controlled, managed by  cardiologist  Lipids: Well controlled with atorvastatin 40 mg .  HDL is  low, triglycerides  minimally increased, this was discussed   Lab Results  Component Value Date   CHOL 117 10/03/2017   HDL 35.20 (L) 10/03/2017   LDLCALC 48 10/03/2017   TRIG 168.0 (H) 10/03/2017   CHOLHDL 3 10/03/2017    His leg ulcer/skin cancer and has been treated      Examination:   BP 140/80 (BP  Location: Left Arm, Patient Position: Sitting, Cuff Size: Normal)   Pulse 77   Ht 6' 1.5" (1.867 m)   Wt 168 lb 6.4 oz (76.4 kg)   SpO2 98%   BMI 21.92 kg/m   Body mass index is 21.92 kg/m.     ASSESSMENT/ PLAN:   Diabetes type 2, nonobese:  See history of present illness for detailed discussion of his current management, blood sugar patterns and problems identified  Blood glucose control is reasonably good at 7.3  He is on multiple drugs including Victoza 1.8 mg At home he is monitoring on his sporadically and not after evening meal and not clear where his blood sugars may be going higher, current sugar readings at home do not see any consistent trend He has gone back to exercising and he may continue to improve his control Hopefully also with monitoring his blood sugars more after meals he will try to be more consistent with diet also  Will not change his glipizide also since he at least 1 week and the good fasting blood sugar in the lab so not a candidate for basal insulin at this time  We will see him back in 3 months with another A1c   There are no Patient Instructions on file for this visit.   [name] 10/05/2017, 11:18 AM

## 2017-10-05 NOTE — Patient Instructions (Signed)
Check blood sugars on waking up  2-3/7  Also check blood sugars about 2 hours after a meal and do this after different meals by rotation  Recommended blood sugar levels on waking up is 90-130 and about 2 hours after meal is 130-160  Please bring your blood sugar monitor to each visit, thank you   

## 2017-11-01 ENCOUNTER — Other Ambulatory Visit: Payer: Self-pay | Admitting: Endocrinology

## 2017-11-07 ENCOUNTER — Ambulatory Visit: Payer: PPO | Admitting: Family

## 2017-11-07 ENCOUNTER — Ambulatory Visit (HOSPITAL_COMMUNITY)
Admission: RE | Admit: 2017-11-07 | Discharge: 2017-11-07 | Disposition: A | Discharge status: Home / Self Care | Payer: PPO | Source: Ambulatory Visit | Attending: Family | Admitting: Family

## 2017-11-07 ENCOUNTER — Other Ambulatory Visit: Payer: Self-pay

## 2017-11-07 ENCOUNTER — Encounter: Payer: Self-pay | Admitting: Family

## 2017-11-07 VITALS — BP 185/89 | HR 70 | Temp 97.3°F | Resp 20 | Ht 73.5 in | Wt 164.0 lb

## 2017-11-07 DIAGNOSIS — I779 Disorder of arteries and arterioles, unspecified: Secondary | ICD-10-CM

## 2017-11-07 DIAGNOSIS — Z87891 Personal history of nicotine dependence: Secondary | ICD-10-CM

## 2017-11-07 DIAGNOSIS — I739 Peripheral vascular disease, unspecified: Secondary | ICD-10-CM | POA: Diagnosis present

## 2017-11-07 NOTE — Patient Instructions (Signed)

## 2017-11-07 NOTE — Progress Notes (Signed)
[facility] [place]   CC: Follow up peripheral artery occlusive disease   History of Present Illness Mario [name] is a 77 y.o. male who is s/p angiogram of RLE with attempted intervention on 12-28-16 by Dr. [name].  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   He continues to play pickle ball regularly.   Dr. [name] last evaluated pt on 04-28-17. At that time wound on right medial malleolus. This was BCC by biopsy and will need Moh's surgery. Hopefully will heal without issues. If he fails to heal wound would need SFA or fem-pop bypass with either small saphenous or arm vein. Pt was to return in 6 months unless wound cannot heal.   He had the Moh's procedure to right medial malleolus in February or March 2019; skin graft was placed over medial malleolus, and pt states last week he scraped this area and the lateral aspect of his left foot in a public pool.  [facility] is his dermatologist, Dr. [name].   Pt has been washing both feet wounds daily and applying a silver preparation to wounds on feet, now covered with large band aids. No erythema surrounding the band aids, no swelling.  Pt denies fever or chills.   After a half hour of playing pickle ball both calves will hurt, relieved by 5 minutes rest.   He has a hx of gout in the left great toe.   [facility], is elevated now. He denies headache or light headedness, denies chest pain; states baseline for him is occasional dyspnea.   He denies any history of stroke. He had a CABG in 2016, quit smoking then.     Diabetic: Yes, A1C was 7.4 on 10-03-17 Tobacco use: former smoker, quit in 2016, smoked x 50 years   Pt meds include: Statin :Yes Betablocker: Yes ASA: Yes Other anticoagulants/antiplatelets: states he is allergic to one blood thinner, plavix and pletal listed in his allergy list  Past  Medical History:  Diagnosis Date  . Arthritis   . CAD (coronary artery disease)    a.  s/p IMI and BMS 1997;  b. Myoview 4/16:  inf scar with peri-infarct ischemia, EF 34%; high risk;  c. LHC 5/16:  3 v CAD >> CABG (free L-LAD, S-OM, S-dRCA)  . Carotid stenosis    a. carotid US 6/16:  bilat ICA 1-39%  . Chronic kidney disease, stage II (mild)   . Chronic systolic CHF (congestive heart failure) ([place])   . Essential hypertension, benign   . Gout   . HLD (hyperlipidemia)   . Ischemic cardiomyopathy    a. EF by myoview 4/16 34%; b. LHC 5/16: EF 40-45%;  c. intraop TEE 6/16: EF 45-50%  . Orthostasis   . PAD (peripheral artery disease) (HCC)    Dr. Fletcher Anon  . Pneumonia    hx of  . Type II or unspecified type diabetes mellitus without mention of complication, uncontrolled     Social History Social History   Tobacco Use  . Smoking status: Former Smoker    Packs/day: 0.50    Years: 50.00    Pack years: 25.00    Types: Cigarettes    Last attempt to quit: 10/22/2014    Years since quitting: 3.0  . Smokeless tobacco: Never Used  Substance Use Topics  . Alcohol use: No    Alcohol/week:  0.0 oz  . Drug use: No    Family History Family History  Problem Relation Age of Onset  . Colon cancer Father   . Heart disease Mother     Past Surgical History:  Procedure Laterality Date  . ABDOMINAL AORTAGRAM N/A 10/09/2013   Procedure: ABDOMINAL Maxcine Ham;  Surgeon: Wellington Hampshire, MD;  Location: [facility]ATH [facility];  Service: Cardiovascular;  Laterality: N/A;  . ABDOMINAL AORTOGRAM N/A 12/28/2016   Procedure: ABDOMINAL AORTOGRAM;  Surgeon: Waynetta Sandy, MD;  Location: [facility];  Service: Cardiovascular;  Laterality: N/A;  . CARDIAC CATHETERIZATION N/A 10/02/2014   Procedure: Left Heart Cath and Coronary Angiography;  Surgeon: Belva Crome, MD;  Location: [facility];  Service: Cardiovascular;  Laterality: N/A;  . CATARACT EXTRACTION    . CHOLECYSTECTOMY    . COLONOSCOPY     . CORONARY ARTERY BYPASS GRAFT N/A 11/03/2014   Procedure: CORONARY ARTERY BYPASS GRAFTING  times three using left internal mammary and right greater saphenous vein;  Surgeon: Gaye Pollack, MD;  Location: [facility] OR;  Service: Open Heart Surgery;  Laterality: N/A;  . HERNIA REPAIR    . LOWER EXTREMITY ANGIOGRAPHY Bilateral 12/28/2016   Procedure: Lower Extremity Angiography;  Surgeon: Waynetta Sandy, MD;  Location: [facility];  Service: Cardiovascular;  Laterality: Bilateral;  . MELANOMA EXCISION    . PERIPHERAL VASCULAR BALLOON ANGIOPLASTY Right 12/28/2016   Procedure: PERIPHERAL VASCULAR BALLOON ANGIOPLASTY;  Surgeon: Waynetta Sandy, MD;  Location: [facility];  Service: Cardiovascular;  Laterality: Right;  SFA UNSUCCESSFUL PTA  UNABLE TO CROSS LESION  . SKIN LESION EXCISION     multiple  . TEE WITHOUT CARDIOVERSION N/A 11/03/2014   Procedure: TRANSESOPHAGEAL ECHOCARDIOGRAM (TEE);  Surgeon: Gaye Pollack, MD;  Location: [facility];  Service: Open Heart Surgery;  Laterality: N/A;    Allergies  Allergen Reactions  . Penicillins Anaphylaxis    Has patient had a PCN reaction causing immediate rash, facial/tongue/throat swelling, SOB or lightheadedness with hypotension: Yes Has patient had a PCN reaction causing severe rash involving mucus membranes or skin necrosis: No Has patient had a PCN reaction that required hospitalization: Yes Has patient had a PCN reaction occurring within the last 10 years: Unknown If all of the above answers are "NO", then may proceed with Cephalosporin use.   Marland Kitchen Plavix [Clopidogrel Bisulfate] Hives  . Pletal [Cilostazol] Itching    Current Outpatient Medications  Medication Sig Dispense Refill  . acetaminophen (TYLENOL) 500 MG tablet Take 1,000 mg by mouth daily as needed for moderate pain.    Marland Kitchen allopurinol (ZYLOPRIM) 100 MG tablet Take 1 tablet by mouth daily 90 tablet 0  . ARTIFICIAL TEAR OP Apply 1 drop to eye daily as needed (dry  eyes).    Marland Kitchen aspirin EC 81 MG tablet Take 81 mg by mouth daily.    Marland Kitchen atorvastatin (LIPITOR) 40 MG tablet TAKE 1 TABLET BY MOUTH DAILY AT 6PM 90 tablet 0  . Blood Glucose Monitoring Suppl (FREESTYLE LITE) DEVI USE TO TEST BLOOD SUGAR TWICE DAILY 1 each 1  . calcium carbonate (TUMS - DOSED IN MG ELEMENTAL CALCIUM) 500 MG chewable tablet Chew 1 tablet by mouth daily as needed for indigestion or heartburn.    . carvedilol (COREG) 3.125 MG tablet TAKE 1 TABLET (3.125 MG TOTAL) BY MOUTH 2 (TWO) TIMES DAILY. 60 tablet 11  . Cholecalciferol (VITAMIN D) 2000 units CAPS Take 2,000 Units by mouth daily.    Marland Kitchen  Coenzyme Q10 (COQ10) 200 MG CAPS Take 200 mg by mouth daily.    Marland Kitchen doxycycline (MONODOX) 100 MG capsule     . fenofibrate micronized (ANTARA) 43 MG capsule Take 43 mg by mouth every evening.    . ferrous sulfate 325 (65 FE) MG tablet Take 325 mg by mouth daily.    . finasteride (PROSCAR) 5 MG tablet Take 5 mg by mouth every evening.   3  . FLUAD 0.5 ML SUSY     . folic acid (FOLVITE) 382 MCG tablet Take 400 mcg by mouth daily.    Marland Kitchen glipiZIDE (GLUCOTROL XL) 10 MG 24 hr tablet Take 1 tablet by mouth at bedtime 90 tablet 0  . glucose blood (FREESTYLE LITE) test strip Use as instructed to check blood sugar 2 times per day dx code E11.65 100 each 3  . Homeopathic Products (LEG CRAMP RELIEF) TABS Take 1 tablet by mouth daily as needed (leg cramps).    . hydrocortisone cream 1 % Apply 1 application topically daily as needed (irritation).    . Lancets (FREESTYLE) lancets Use as instructed to check blood sugar 2 times per day dx code E11.65 100 each 3  . lisinopril (PRINIVIL,ZESTRIL) 5 MG tablet TAKE 1 TABLET BY MOUTH EVERY DAY 30 tablet 11  . metFORMIN (GLUCOPHAGE) 1000 MG tablet TAKE 1 TABLET BY MOUTH TWICE A DAY WITH A FULL MEAL 180 tablet 1  . miglitol (GLYSET) 50 MG tablet TAKE ONE TABLET BY MOUTH TWICE DAILY  180 tablet 0  . multivitamin-lutein (OCUVITE-LUTEIN) CAPS capsule Take 1 capsule by mouth 2 (two)  times daily.    . tamsulosin (FLOMAX) 0.4 MG CAPS capsule Take 0.4 mg by mouth 2 (two) times daily.    Marland Kitchen VICTOZA 18 MG/3ML SOPN INJECT 0.3ML (1.8 MGS) INTO THE SKIN DAILY 9 mL 2  . vitamin B-12 (CYANOCOBALAMIN) 1000 MCG tablet Take 1,000 mcg by mouth daily.    Marland Kitchen azithromycin (ZITHROMAX) 500 MG tablet      No current facility-administered medications for this visit.     ROS: See HPI for pertinent positives and negatives.   Physical Examination  Vitals:   11/07/17 1150 11/07/17 1151  BP: (!) 179/8 (!) 185/89  Pulse: 70   Resp: 20   Temp: (!) 97.3 F (36.3 C)   TempSrc: Oral   SpO2: 100%   Weight: 164 lb (74.4 kg)   Height: 6' 1.5" (1.867 m)    Body mass index is 21.34 kg/m.  General: A&O x 3, WDWN, elderly male. Gait: normal HENT: No gross abnormalities.  Eyes: PERRLA. Pulmonary: Respirations are non labored, CTAB, diminished air movement in all fields Cardiac: regular rhythm, no detected murmur.         Carotid Bruits Right Left   Negative Negative   Radial pulses are 2+ palpable bilaterally   Adominal aortic pulse is not palpable                         VASCULAR EXAM: Extremities without ischemic changes, without Gangrene; with open wounds right medial malleolus skin graft injury and left lateral foot recent abrasion, covered with bandages.  LE Pulses Right Left       FEMORAL  2+ palpable  2+ palpable        POPLITEAL  not palpable   not palpable       POSTERIOR TIBIAL  not palpable   not palpable        DORSALIS PEDIS      ANTERIOR TIBIAL Faintly palpable  not palpable    Abdomen: soft, NT, no palpable masses. Skin: no rashes, no cellulitis, no ulcers noted. Musculoskeletal: no muscle wasting or atrophy.  Neurologic: A&O X 3; appropriate affect, Sensation is normal; MOTOR FUNCTION:  moving all extremities equally, motor strength 5/5 throughout. Speech  is fluent/normal. CN 2-12 intact except for some hearing loss. Psychiatric: Thought content is normal, mood appropriate for clinical situation.     ASSESSMENT: Mario [name] is a 77 y.o. male whom Dr. [name] evaluated for a lesion on pt right medial malleolus, which biopsy demonstrated as BCC. Pt had a Moh's procedure to this, with a skin graft afterward. Pt then scraped this skin graft and also the lateral aspect of his left foot in a public swimming pool last week. Pt has been washing these wounds daily with soap and water, and using a topical silver preparation, on these wounds which seem to be healing.  I advised pt to notify his dermatologist re this, Dr. [name].   Dr. [name] indicated in his progress note of 04-28-17, that if he fails to heal wound would need SFA or fem-pop bypass with either small saphenous or arm vein.   After a half hour of playing pickle ball both calves will hurt, relieved by 5 minutes rest.   There are no signs of ischemia in his feet or legs.    DATA  ABI (Date: 11/07/2017):  R:   ABI: 1.73, [place] (was [place] on 11-25-16),   PT: mono  DP: bi  TBI:  0.44, great toe pressure 64 (was 0.57)  L:   ABI: 1.73, [place] (was [place]),   PT: mono  DP: mono  TBI: 0.32, great toe pressure 47 (was 0.31)   Bilateral ABI are unreliable since vessels are non compressible. Bi and monophasic waveforms in the right, mono in the left.   Slight decline of right TBI, stable left TBI, both below normal.    Aortogram 12-28-16: Findings: The aorta iliac and common femoral arteries are heavily calcified bilaterally. The left common femoral artery has a subtotal occlusion in the right common femoral artery has at least 50% stenosis. The right lower extremity the SFA occludes at its extreme distal point in the adductor canal and reconstitutes a popliteal artery at the knee with dominant runoff to the foot via the anterior tibial artery. The left lower extremity has occlusion of the popliteal  artery that a short segment and reconstituted at the knee with also dominant runoff via the anterior tibial artery. We were unable to cross the heavily calcified area in the right popliteal artery and no intervention was undertaken.   PLAN:  Based on the patient's vascular studies and examination, pt will return to clinic in 6 months with ABI's. I advised pt to walk at least 30 minutes daily, and to notify us if he develops concerns re the circulation in his feet or legs.   I discussed in depth with the patient the nature of atherosclerosis, and emphasized the importance of maximal medical management including strict control of blood pressure, blood glucose, and lipid levels, obtaining regular exercise, and continued cessation  of smoking.  The patient is aware that without maximal medical management the underlying atherosclerotic disease process will progress, limiting the benefit of any interventions.  The patient was given information about PAD including signs, symptoms, treatment, what symptoms should prompt the patient to seek immediate medical care, and risk reduction measures to take.  Clemon Chambers, RN, MSN, FNP-C [facility] Phone: [phone]  Clinic MD: Early  11/07/17 12:00 PM

## 2017-11-15 ENCOUNTER — Ambulatory Visit (INDEPENDENT_AMBULATORY_CARE_PROVIDER_SITE_OTHER): Payer: PPO | Admitting: Ophthalmology

## 2017-11-15 DIAGNOSIS — H35033 Hypertensive retinopathy, bilateral: Secondary | ICD-10-CM | POA: Diagnosis not present

## 2017-11-15 DIAGNOSIS — H353122 Nonexudative age-related macular degeneration, left eye, intermediate dry stage: Secondary | ICD-10-CM | POA: Diagnosis not present

## 2017-11-15 DIAGNOSIS — H35372 Puckering of macula, left eye: Secondary | ICD-10-CM | POA: Diagnosis not present

## 2017-11-15 DIAGNOSIS — H43813 Vitreous degeneration, bilateral: Secondary | ICD-10-CM | POA: Diagnosis not present

## 2017-11-15 DIAGNOSIS — I10 Essential (primary) hypertension: Secondary | ICD-10-CM

## 2017-11-29 ENCOUNTER — Other Ambulatory Visit: Payer: Self-pay | Admitting: Interventional Cardiology

## 2017-11-29 DIAGNOSIS — I10 Essential (primary) hypertension: Secondary | ICD-10-CM

## 2017-11-30 ENCOUNTER — Other Ambulatory Visit: Payer: Self-pay

## 2017-11-30 DIAGNOSIS — I739 Peripheral vascular disease, unspecified: Secondary | ICD-10-CM

## 2017-11-30 DIAGNOSIS — I7025 Atherosclerosis of native arteries of other extremities with ulceration: Secondary | ICD-10-CM

## 2017-11-30 DIAGNOSIS — I779 Disorder of arteries and arterioles, unspecified: Secondary | ICD-10-CM

## 2017-12-07 DIAGNOSIS — R351 Nocturia: Secondary | ICD-10-CM | POA: Diagnosis not present

## 2017-12-07 DIAGNOSIS — N401 Enlarged prostate with lower urinary tract symptoms: Secondary | ICD-10-CM | POA: Diagnosis not present

## 2017-12-07 DIAGNOSIS — N5201 Erectile dysfunction due to arterial insufficiency: Secondary | ICD-10-CM | POA: Diagnosis not present

## 2017-12-09 ENCOUNTER — Other Ambulatory Visit: Payer: Self-pay | Admitting: Endocrinology

## 2017-12-21 DIAGNOSIS — Z87891 Personal history of nicotine dependence: Secondary | ICD-10-CM | POA: Diagnosis not present

## 2017-12-21 DIAGNOSIS — L84 Corns and callosities: Secondary | ICD-10-CM | POA: Diagnosis not present

## 2017-12-21 DIAGNOSIS — Z7984 Long term (current) use of oral hypoglycemic drugs: Secondary | ICD-10-CM | POA: Diagnosis not present

## 2017-12-21 DIAGNOSIS — B351 Tinea unguium: Secondary | ICD-10-CM | POA: Diagnosis not present

## 2017-12-21 DIAGNOSIS — E1151 Type 2 diabetes mellitus with diabetic peripheral angiopathy without gangrene: Secondary | ICD-10-CM | POA: Diagnosis not present

## 2017-12-21 DIAGNOSIS — I739 Peripheral vascular disease, unspecified: Secondary | ICD-10-CM | POA: Diagnosis not present

## 2017-12-25 ENCOUNTER — Other Ambulatory Visit: Payer: Self-pay | Admitting: Endocrinology

## 2017-12-25 ENCOUNTER — Encounter: Payer: Self-pay | Admitting: Nurse Practitioner

## 2017-12-25 ENCOUNTER — Ambulatory Visit: Payer: PPO | Admitting: Nurse Practitioner

## 2017-12-25 VITALS — BP 140/70 | HR 72 | Ht 73.0 in | Wt 163.4 lb

## 2017-12-25 DIAGNOSIS — I10 Essential (primary) hypertension: Secondary | ICD-10-CM

## 2017-12-25 DIAGNOSIS — I25810 Atherosclerosis of coronary artery bypass graft(s) without angina pectoris: Secondary | ICD-10-CM

## 2017-12-25 DIAGNOSIS — E7849 Other hyperlipidemia: Secondary | ICD-10-CM

## 2017-12-25 NOTE — Progress Notes (Signed)
CARDIOLOGY OFFICE NOTE  Date:  12/25/2017    [name] Date of Birth: 1941-02-02 Medical Record #[id number]  PCP:  [name], MD  Cardiologist:  Tamala Julian    Chief Complaint  Patient presents with  . Coronary Artery Disease  . PAD  . Hypertension  . Hyperlipidemia    1 year check - seen for Dr. Tamala Julian    History of Present Illness: Mario [name] is a 77 y.o. male who presents today for a one year check. Seen for Dr. Tamala Julian.   He has a history of CAD with prior CABG in 2016, PAD, HLD, DM, HTN and chronic combined systolic and diastolic heart failure.   Last seen a year ago - stable cardiac status - was having more issues with his PAD with bilateral lower extremity claudication/diffuse lower extremity arterial obstructive disease. Anatomy did not appear amenable to percutaneous revascularization. He has had a wound on the right medial malleolus that was slow to heal and turned out to be basal cell carcinoma - requiring Mohs and then skin grafting.   Comes in today. Here alone. He is doing ok. He is still playing pickle ball. His legs do hurt (claudication) with activity after about 30 minutes - he then rests and then it resolves - this is pretty stable.  No chest pain. Breathing is fine. He notes that his daughter in law runs a homeopathic shop - she is asking about cutting his Lipitor back. His last lipids from May are noted. Overall, he feels like he is doing well.   Past Medical History:  Diagnosis Date  . Arthritis   . CAD (coronary artery disease)    a.  s/p IMI and BMS 1997;  b. Myoview 4/16:  inf scar with peri-infarct ischemia, EF 34%; high risk;  c. LHC 5/16:  3 v CAD >> CABG (free L-LAD, S-OM, S-dRCA)  . Carotid stenosis    a. carotid US 6/16:  bilat ICA 1-39%  . Chronic kidney disease, stage II (mild)   . Chronic systolic CHF (congestive heart failure) ([place])   . Essential hypertension, benign   . Gout   . HLD (hyperlipidemia)   . Ischemic cardiomyopathy     a. EF by myoview 4/16 34%; b. LHC 5/16: EF 40-45%;  c. intraop TEE 6/16: EF 45-50%  . Orthostasis   . PAD (peripheral artery disease) (HCC)    Dr. Fletcher Anon  . Pneumonia    hx of  . Type II or unspecified type diabetes mellitus without mention of complication, uncontrolled     Past Surgical History:  Procedure Laterality Date  . ABDOMINAL AORTAGRAM N/A 10/09/2013   Procedure: ABDOMINAL Maxcine Ham;  Surgeon: Wellington Hampshire, MD;  Location: [facility] CATH [facility];  Service: Cardiovascular;  Laterality: N/A;  . ABDOMINAL AORTOGRAM N/A 12/28/2016   Procedure: ABDOMINAL AORTOGRAM;  Surgeon: Waynetta Sandy, MD;  Location: [facility];  Service: Cardiovascular;  Laterality: N/A;  . CARDIAC CATHETERIZATION N/A 10/02/2014   Procedure: Left Heart Cath and Coronary Angiography;  Surgeon: Belva Crome, MD;  Location: [facility];  Service: Cardiovascular;  Laterality: N/A;  . CATARACT EXTRACTION    . CHOLECYSTECTOMY    . COLONOSCOPY    . CORONARY ARTERY BYPASS GRAFT N/A 11/03/2014   Procedure: CORONARY ARTERY BYPASS GRAFTING  times three using left internal mammary and right greater saphenous vein;  Surgeon: Gaye Pollack, MD;  Location: [facility] OR;  Service: Open Heart Surgery;  Laterality: N/A;  .  HERNIA REPAIR    . LOWER EXTREMITY ANGIOGRAPHY Bilateral 12/28/2016   Procedure: Lower Extremity Angiography;  Surgeon: Waynetta Sandy, MD;  Location: [facility];  Service: Cardiovascular;  Laterality: Bilateral;  . MELANOMA EXCISION    . PERIPHERAL VASCULAR BALLOON ANGIOPLASTY Right 12/28/2016   Procedure: PERIPHERAL VASCULAR BALLOON ANGIOPLASTY;  Surgeon: Waynetta Sandy, MD;  Location: [facility];  Service: Cardiovascular;  Laterality: Right;  SFA UNSUCCESSFUL PTA  UNABLE TO CROSS LESION  . SKIN LESION EXCISION     multiple  . TEE WITHOUT CARDIOVERSION N/A 11/03/2014   Procedure: TRANSESOPHAGEAL ECHOCARDIOGRAM (TEE);  Surgeon: Gaye Pollack, MD;  Location: [facility];  Service: Open Heart Surgery;  Laterality: N/A;     Medications: Current Meds  Medication Sig  . acetaminophen (TYLENOL) 500 MG tablet Take 1,000 mg by mouth daily as needed for moderate pain.  Marland Kitchen allopurinol (ZYLOPRIM) 100 MG tablet Take 1 tablet by mouth daily  . ARTIFICIAL TEAR OP Apply 1 drop to eye daily as needed (dry eyes).  Marland Kitchen aspirin EC 81 MG tablet Take 81 mg by mouth daily.  Marland Kitchen atorvastatin (LIPITOR) 40 MG tablet take 1 tablet by mouth daily at 6:00pm   . Blood Glucose Monitoring Suppl (FREESTYLE LITE) DEVI USE TO TEST BLOOD SUGAR TWICE DAILY  . calcium carbonate (TUMS - DOSED IN MG ELEMENTAL CALCIUM) 500 MG chewable tablet Chew 1 tablet by mouth daily as needed for indigestion or heartburn.  . carvedilol (COREG) 3.125 MG tablet Take 1 tablet (3.125 mg total) by mouth 2 (two) times daily.  . Cholecalciferol (VITAMIN D) 2000 units CAPS Take 2,000 Units by mouth daily.  . Coenzyme Q10 (COQ10) 200 MG CAPS Take 200 mg by mouth daily.  . fenofibrate micronized (ANTARA) 43 MG capsule Take 43 mg by mouth every evening.  . ferrous sulfate 325 (65 FE) MG tablet Take 325 mg by mouth daily.  . finasteride (PROSCAR) 5 MG tablet Take 5 mg by mouth every evening.   Marland Kitchen FLUAD 0.5 ML SUSY   . folic acid (FOLVITE) 440 MCG tablet Take 400 mcg by mouth daily.  Marland Kitchen glipiZIDE (GLUCOTROL XL) 10 MG 24 hr tablet Take 1 tablet by mouth at bedtime  . glucose blood (FREESTYLE LITE) test strip Use as instructed to check blood sugar 2 times per day dx code E11.65  . Homeopathic Products (LEG CRAMP RELIEF) TABS Take 1 tablet by mouth daily as needed (leg cramps).  . hydrocortisone cream 1 % Apply 1 application topically daily as needed (irritation).  . Lancets (FREESTYLE) lancets Use as instructed to check blood sugar 2 times per day dx code E11.65  . lisinopril (PRINIVIL,ZESTRIL) 5 MG tablet Take 1 tablet (5 mg total) by mouth daily.  . metFORMIN (GLUCOPHAGE) 1000 MG tablet TAKE 1 TABLET BY MOUTH TWICE A DAY  WITH A FULL MEAL  . miglitol (GLYSET) 50 MG tablet TAKE ONE TABLET BY MOUTH TWICE DAILY   . miglitol (GLYSET) 50 MG tablet TAKE ONE TABLET BY MOUTH TWICE DAILY   . multivitamin-lutein (OCUVITE-LUTEIN) CAPS capsule Take 1 capsule by mouth 2 (two) times daily.  . tamsulosin (FLOMAX) 0.4 MG CAPS capsule Take 0.4 mg by mouth 2 (two) times daily.  Marland Kitchen VICTOZA 18 MG/3ML SOPN INJECT 0.3ML (1.8 MGS) INTO THE SKIN DAILY  . vitamin B-12 (CYANOCOBALAMIN) 1000 MCG tablet Take 1,000 mcg by mouth daily.     Allergies: Allergies  Allergen Reactions  . Penicillins Anaphylaxis    Has patient  had a PCN reaction causing immediate rash, facial/tongue/throat swelling, SOB or lightheadedness with hypotension: Yes Has patient had a PCN reaction causing severe rash involving mucus membranes or skin necrosis: No Has patient had a PCN reaction that required hospitalization: Yes Has patient had a PCN reaction occurring within the last 10 years: Unknown If all of the above answers are "NO", then may proceed with Cephalosporin use.   Marland Kitchen Plavix [Clopidogrel Bisulfate] Hives  . Pletal [Cilostazol] Itching    Social History: The patient  reports that he quit smoking about 3 years ago. His smoking use included cigarettes. He has a 25.00 pack-year smoking history. He has never used smokeless tobacco. He reports that he does not drink alcohol or use drugs.   Family History: The patient's family history includes Colon cancer in his father; Heart disease in his mother.   Review of Systems: Please see the history of present illness.   Otherwise, the review of systems is positive for none.   All other systems are reviewed and negative.   Physical Exam: VS:  BP 140/70 (BP Location: Left Arm, Patient Position: Sitting, Cuff Size: Normal)   Pulse 72   Ht 6\' 1"  (1.854 m)   Wt 163 lb 6.4 oz (74.1 kg)   BMI 21.56 kg/m  .  BMI Body mass index is 21.56 kg/m.  Wt Readings from Last 3 Encounters:  12/25/17 163 lb 6.4 oz  (74.1 kg)  11/07/17 164 lb (74.4 kg)  10/05/17 168 lb 6.4 oz (76.4 kg)    General: Pleasant. Alert and in no acute distress.  Quite tall & thin.  HEENT: Normal.  Neck: Supple, no JVD, carotid bruits, or masses noted.  Cardiac: Regular rate and rhythm. No murmurs, rubs, or gallops. No edema.  Respiratory:  Lungs are clear to auscultation bilaterally with normal work of breathing.  GI: Soft and nontender.  MS: No deformity or atrophy. Gait and ROM intact.  Skin: Warm and dry. Color is normal.  Neuro:  Strength and sensation are intact and no gross focal deficits noted.  Psych: Alert, appropriate and with normal affect.   LABORATORY DATA:  EKG:  EKG is ordered today. This demonstrates NSR with 1st degree AV block. Nonspecific T wave changes.   Lab Results  Component Value Date   WBC 10.6 (H) 11/05/2014   HGB 10.2 (L) 12/28/2016   HCT 30.0 (L) 12/28/2016   PLT 112 (L) 11/05/2014   GLUCOSE 145 (H) 10/03/2017   CHOL 117 10/03/2017   TRIG 168.0 (H) 10/03/2017   HDL 35.20 (L) 10/03/2017   LDLCALC 48 10/03/2017   ALT 37 10/03/2017   AST 27 10/03/2017   NA 140 10/03/2017   K 4.6 10/03/2017   CL 111 10/03/2017   CREATININE 1.34 10/03/2017   BUN 25 (H) 10/03/2017   CO2 23 10/03/2017   INR 1.51 (H) 11/03/2014   HGBA1C 7.4 (H) 10/03/2017   MICROALBUR 11.7 (H) 04/07/2017     BNP (last 3 results) No results for input(s): BNP in the last 8760 hours.  ProBNP (last 3 results) No results for input(s): PROBNP in the last 8760 hours.   Other Studies Reviewed Today:  Lower Extremity Arterial Doppler 12/28/2016: Findings: The aorta iliac and common femoral arteries are heavily calcified bilaterally. The left common femoral artery has a subtotal occlusion in the right common femoral artery has at least 50% stenosis. The right lower extremity the SFA occludes at its extreme distal point in the adductor canal and reconstitutes a popliteal  artery at the knee with dominant runoff to the foot  via the anterior tibial artery. The left lower extremity has occlusion of the popliteal artery that a short segment and reconstituted at the knee with also dominant runoff via the anterior tibial artery. We were unable to cross the heavily calcified area in the right popliteal artery and no intervention was undertaken.  Assessment & Plan:  1. CAD - prior CABG - no active symptoms - favor continued CV risk factor modification. Remains active. Not smoking.   2. HTN - BP is ok - no changes made today.   3. PAD - followed by [facility] - he has stable claudication symptoms.   4. HLD - on statin - LDL was 48 - he would like to try cutting his dose in half - recheck in 3 months. IWe will then see what this shows.   5. CKD- labs noted.   Current medicines are reviewed with the patient today.  The patient does not have concerns regarding medicines other than what has been noted above.  The following changes have been made:  See above.  Labs/ tests ordered today include:    Orders Placed This Encounter  Procedures  . EKG 12-Lead     Disposition:   FU with Dr. Tamala Julian in one year.    Patient is agreeable to this plan and will call if any problems develop in the interim.   SignedTruitt Merle, NP  12/25/2017 3:14 PM  [facility] [address] [place], [place]  [address] Phone: [phone] Fax: [phone]

## 2017-12-25 NOTE — Patient Instructions (Addendum)
We will be checking the following labs today - NONE  Follow up lipids and LFTs in 3 months - ok to do here or with one of your other providers - just let us know so we can give you an appointment if needed.    Medication Instructions:    Continue with your current medicines. BUT  Ok to cut the Lipitor in half for 3 months - please let Dr. Dwyane Dee know about this when you see him next    Testing/Procedures To Be Arranged:  N/A  Follow-Up:   See Dr. Tamala Julian in one year.    Other Special Instructions:   N/A    If you need a refill on your cardiac medications before your next appointment, please call your pharmacy.   Call the [place] office at [phone] if you have any questions, problems or concerns.

## 2018-01-01 ENCOUNTER — Other Ambulatory Visit: Payer: Self-pay | Admitting: Interventional Cardiology

## 2018-01-01 ENCOUNTER — Other Ambulatory Visit (INDEPENDENT_AMBULATORY_CARE_PROVIDER_SITE_OTHER): Payer: PPO

## 2018-01-01 DIAGNOSIS — E1165 Type 2 diabetes mellitus with hyperglycemia: Secondary | ICD-10-CM | POA: Diagnosis not present

## 2018-01-01 LAB — COMPREHENSIVE METABOLIC PANEL
ALT: 35 U/L (ref 0–53)
AST: 25 U/L (ref 0–37)
Albumin: 3.9 g/dL (ref 3.5–5.2)
Alkaline Phosphatase: 56 U/L (ref 39–117)
BUN: 25 mg/dL — ABNORMAL HIGH (ref 6–23)
CO2: 23 mEq/L (ref 19–32)
Calcium: 9 mg/dL (ref 8.4–10.5)
Chloride: 111 mEq/L (ref 96–112)
Creatinine, Ser: 1.32 mg/dL (ref 0.40–1.50)
GFR: 55.94 mL/min — ABNORMAL LOW (ref >60.00)
Glucose, Bld: 162 mg/dL — ABNORMAL HIGH (ref 70–99)
Potassium: 4.8 mEq/L (ref 3.5–5.1)
Sodium: 138 mEq/L (ref 135–145)
Total Bilirubin: 0.8 mg/dL (ref 0.2–1.2)
Total Protein: 6 g/dL (ref 6.0–8.3)

## 2018-01-01 LAB — MICROALBUMIN / CREATININE URINE RATIO
Creatinine,U: 82.5 mg/dL
Microalb Creat Ratio: 11.8 mg/g (ref 0.0–30.0)
Microalb, Ur: 9.7 mg/dL — ABNORMAL HIGH (ref 0.0–1.9)

## 2018-01-01 LAB — HEMOGLOBIN A1C: Hgb A1c MFr Bld: 7.1 % — ABNORMAL HIGH (ref 4.6–6.5)

## 2018-01-03 ENCOUNTER — Encounter: Payer: Self-pay | Admitting: Endocrinology

## 2018-01-03 ENCOUNTER — Ambulatory Visit: Payer: PPO | Admitting: Endocrinology

## 2018-01-03 VITALS — BP 106/60 | HR 83 | Ht 73.0 in | Wt 161.2 lb

## 2018-01-03 DIAGNOSIS — E1165 Type 2 diabetes mellitus with hyperglycemia: Secondary | ICD-10-CM | POA: Diagnosis not present

## 2018-01-03 NOTE — Patient Instructions (Addendum)
Rotate testing times  Check blood sugars on waking up  2-3/7  Also check blood sugars about 2 hours after a meal and do this after different meals by rotation  Recommended blood sugar levels on waking up is 90-130 and about 2 hours after meal is 130-160  Please bring your blood sugar monitor to each visit, thank you  Reduce the Victoza  dose to 1.2 mg

## 2018-01-03 NOTE — Progress Notes (Signed)
Patient ID: Mario [name], male   DOB: August 02, 1940, 77 y.o.   MRN: [id number]   Reason for Appointment: Diabetes follow-up   History of Present Illness   Diagnosis: Type 2 DIABETES MELITUS, date of diagnosis:  1989     Previous history: He was initially diagnosed when hospitalized for pancreatitis and his previous endocrinologist had treated him with multiple medications because of progression of his diabetes. He was also put on Cycloset  which he has tolerated maximum dose He has had adjustments of his medication dosages the last couple of years and Januvia was restarted in 5/14 when blood sugars were higher. Dosages have been adjusted based on renal function Previously an A1c levels have been ranging from 6.6-7.5 His metformin was increased  when renal function was better and glipizide was changed to glipizide ER 10 mg . Did not appear to have better blood sugar control with Invokana He was started on Victoza for improved control when his A1c was 7.9% in 02/2015  Recent history:    Non-insulin hypoglycemic drugs:  Glyset 50 mg bid, glipizide ER 10 mg , Metformin 1 g twice a day,   Victoza 1.8 mg daily       His A1c is  relatively better at 7.1  Blood sugar patterns and problems:  He has lost some weight since his last visit and he is not sure why  He has been exercising consistently since at least April  Although he is taking 1.8 Victoza for quite some time he does not think this had previously made him lose weight or excessively reduce his appetite  Overall he thinks he is eating little healthier and cutting back on sweets like donuts more often than before  He did not bring his monitor for download and again probably checking blood sugars only occasionally and mostly midday  He does not know why his fasting blood sugar was 162, not checking fasting readings in the morning  However most of his readings appear to be in the 120s unless he goes off his diet  He  tries to take his Glyset before breakfast and evening meal consistently  Side effects from medications: None.  Monitors blood glucose: Once a day or less .    Glucometer: Freestyle     Blood Glucose readings as above   Meals: 2-3 meals per day, no lunch often (yogurt); breakfast is at 9 am cereal, [place] bacon, sometimes bread and eggs at breakfast; donuts occasionally      Dietician consultation last: 3/16        Physical activity: exercise: going to the gym 3+/7,Doing various exercises      Wt Readings from Last 3 Encounters:  01/03/18 161 lb 3.2 oz (73.1 kg)  12/25/17 163 lb 6.4 oz (74.1 kg)  11/07/17 164 lb (74.4 kg)    Lab Results  Component Value Date   HGBA1C 7.1 (H) 01/01/2018   HGBA1C 7.4 (H) 10/03/2017   HGBA1C 7.6 (H) 07/07/2017   Lab Results  Component Value Date   MICROALBUR 9.7 (H) 01/01/2018   LDLCALC 48 10/03/2017   CREATININE 1.32 01/01/2018     LABS:  Lab on 01/01/2018  Component Date Value Ref Range Status  . Microalb, Ur 01/01/2018 9.7* 0.0 - 1.9 mg/dL Final  . Creatinine,U 01/01/2018 82.5  mg/dL Final  . Microalb Creat Ratio 01/01/2018 11.8  0.0 - 30.0 mg/g Final  . Sodium 01/01/2018 138  135 - 145 mEq/L Final  . Potassium 01/01/2018 4.8  3.5 - 5.1 mEq/L Final  . Chloride 01/01/2018 111  96 - 112 mEq/L Final  . CO2 01/01/2018 23  19 - 32 mEq/L Final  . Glucose, Bld 01/01/2018 162* 70 - 99 mg/dL Final  . BUN 01/01/2018 25* 6 - 23 mg/dL Final  . Creatinine, Ser 01/01/2018 1.32  0.40 - 1.50 mg/dL Final  . Total Bilirubin 01/01/2018 0.8  0.2 - 1.2 mg/dL Final  . Alkaline Phosphatase 01/01/2018 56  39 - 117 U/L Final  . AST 01/01/2018 25  0 - 37 U/L Final  . ALT 01/01/2018 35  0 - 53 U/L Final  . Total Protein 01/01/2018 6.0  6.0 - 8.3 g/dL Final  . Albumin 01/01/2018 3.9  3.5 - 5.2 g/dL Final  . Calcium 01/01/2018 9.0  8.4 - 10.5 mg/dL Final  . GFR 01/01/2018 55.94* >60.00 mL/min Final  . Hgb A1c MFr Bld 01/01/2018 7.1* 4.6 - 6.5 % Final    Glycemic Control Guidelines for People with Diabetes:Non Diabetic:  <6%Goal of Therapy: <7%Additional Action Suggested:  >8%     Allergies as of 01/03/2018      Reactions   Penicillins Anaphylaxis   Has patient had a PCN reaction causing immediate rash, facial/tongue/throat swelling, SOB or lightheadedness with hypotension: Yes Has patient had a PCN reaction causing severe rash involving mucus membranes or skin necrosis: No Has patient had a PCN reaction that required hospitalization: Yes Has patient had a PCN reaction occurring within the last 10 years: Unknown If all of the above answers are "NO", then may proceed with Cephalosporin use.   Plavix [clopidogrel Bisulfate] Hives   Pletal [cilostazol] Itching      Medication List        Accurate as of 01/03/18  7:31 PM. Always use your most recent med list.          acetaminophen 500 MG tablet Commonly known as:  TYLENOL Take 1,000 mg by mouth daily as needed for moderate pain.   allopurinol 100 MG tablet Commonly known as:  ZYLOPRIM Take 1 tablet by mouth daily   ARTIFICIAL TEAR OP Apply 1 drop to eye daily as needed (dry eyes).   aspirin EC 81 MG tablet Take 81 mg by mouth daily.   atorvastatin 40 MG tablet Commonly known as:  LIPITOR take 1 tablet by mouth daily at 6:00pm   calcium carbonate 500 MG chewable tablet Commonly known as:  TUMS - dosed in mg elemental calcium Chew 1 tablet by mouth daily as needed for indigestion or heartburn.   carvedilol 3.125 MG tablet Commonly known as:  COREG TAKE 1 TABLET BY MOUTH TWICE A DAY   CoQ10 200 MG Caps Take 200 mg by mouth daily.   fenofibrate micronized 43 MG capsule Commonly known as:  ANTARA Take 43 mg by mouth every evening.   ferrous sulfate 325 (65 FE) MG tablet Take 325 mg by mouth daily.   finasteride 5 MG tablet Commonly known as:  PROSCAR Take 5 mg by mouth every evening.   FLUAD 0.5 ML Susy Generic drug:  Influenza Vac A&B Surf Ant Adj   folic acid  638 MCG tablet Commonly known as:  FOLVITE Take 400 mcg by mouth daily.   freestyle lancets Use as instructed to check blood sugar 2 times per day dx code E11.65   FREESTYLE LITE Devi USE TO TEST BLOOD SUGAR TWICE DAILY   glipiZIDE 10 MG 24 hr tablet Commonly known as:  GLUCOTROL XL Take 1 tablet by mouth  at bedtime   glucose blood test strip Use as instructed to check blood sugar 2 times per day dx code E11.65   hydrocortisone cream 1 % Apply 1 application topically daily as needed (irritation).   LEG CRAMP RELIEF Tabs Take 1 tablet by mouth daily as needed (leg cramps).   lisinopril 5 MG tablet Commonly known as:  PRINIVIL,ZESTRIL Take 1 tablet (5 mg total) by mouth daily.   metFORMIN 1000 MG tablet Commonly known as:  GLUCOPHAGE TAKE 1 TABLET BY MOUTH TWICE A DAY WITH A FULL MEAL   miglitol 50 MG tablet Commonly known as:  GLYSET TAKE ONE TABLET BY MOUTH TWICE DAILY   miglitol 50 MG tablet Commonly known as:  GLYSET TAKE ONE TABLET BY MOUTH TWICE DAILY   multivitamin-lutein Caps capsule Take 1 capsule by mouth 2 (two) times daily.   tamsulosin 0.4 MG Caps capsule Commonly known as:  FLOMAX Take 0.4 mg by mouth 2 (two) times daily.   VICTOZA 18 MG/3ML Sopn Generic drug:  liraglutide INJECT 0.3ML (1.8 MGS) INTO THE SKIN DAILY   vitamin B-12 1000 MCG tablet Commonly known as:  CYANOCOBALAMIN Take 1,000 mcg by mouth daily.   Vitamin D 2000 units Caps Take 2,000 Units by mouth daily.       Allergies:  Allergies  Allergen Reactions  . Penicillins Anaphylaxis    Has patient had a PCN reaction causing immediate rash, facial/tongue/throat swelling, SOB or lightheadedness with hypotension: Yes Has patient had a PCN reaction causing severe rash involving mucus membranes or skin necrosis: No Has patient had a PCN reaction that required hospitalization: Yes Has patient had a PCN reaction occurring within the last 10 years: Unknown If all of the above answers  are "NO", then may proceed with Cephalosporin use.   Marland Kitchen Plavix [Clopidogrel Bisulfate] Hives  . Pletal [Cilostazol] Itching    Past Medical History:  Diagnosis Date  . Arthritis   . CAD (coronary artery disease)    a.  s/p IMI and BMS 1997;  b. Myoview 4/16:  inf scar with peri-infarct ischemia, EF 34%; high risk;  c. LHC 5/16:  3 v CAD >> CABG (free L-LAD, S-OM, S-dRCA)  . Carotid stenosis    a. carotid US 6/16:  bilat ICA 1-39%  . Chronic kidney disease, stage II (mild)   . Chronic systolic CHF (congestive heart failure) ([facility])   . Essential hypertension, benign   . Gout   . HLD (hyperlipidemia)   . Ischemic cardiomyopathy    a. EF by myoview 4/16 34%; b. LHC 5/16: EF 40-45%;  c. intraop TEE 6/16: EF 45-50%  . Orthostasis   . PAD (peripheral artery disease) ([facility])    Dr. Fletcher Anon  . Pneumonia    hx of  . Type II or unspecified type diabetes mellitus without mention of complication, uncontrolled     Past Surgical History:  Procedure Laterality Date  . ABDOMINAL AORTAGRAM N/A 10/09/2013   Procedure: ABDOMINAL Maxcine Ham;  Surgeon: Wellington Hampshire, MD;  Location: [facility];  Service: Cardiovascular;  Laterality: N/A;  . ABDOMINAL AORTOGRAM N/A 12/28/2016   Procedure: ABDOMINAL AORTOGRAM;  Surgeon: Waynetta Sandy, MD;  Location: [facility];  Service: Cardiovascular;  Laterality: N/A;  . CARDIAC CATHETERIZATION N/A 10/02/2014   Procedure: Left Heart Cath and Coronary Angiography;  Surgeon: Belva Crome, MD;  Location: [facility];  Service: Cardiovascular;  Laterality: N/A;  . CATARACT EXTRACTION    . CHOLECYSTECTOMY    .  COLONOSCOPY    . CORONARY ARTERY BYPASS GRAFT N/A 11/03/2014   Procedure: CORONARY ARTERY BYPASS GRAFTING  times three using left internal mammary and right greater saphenous vein;  Surgeon: Gaye Pollack, MD;  Location: [facility];  Service: Open Heart Surgery;  Laterality: N/A;  . HERNIA REPAIR    . LOWER EXTREMITY ANGIOGRAPHY Bilateral  12/28/2016   Procedure: Lower Extremity Angiography;  Surgeon: Waynetta Sandy, MD;  Location: [facility];  Service: Cardiovascular;  Laterality: Bilateral;  . MELANOMA EXCISION    . PERIPHERAL VASCULAR BALLOON ANGIOPLASTY Right 12/28/2016   Procedure: PERIPHERAL VASCULAR BALLOON ANGIOPLASTY;  Surgeon: Waynetta Sandy, MD;  Location: [facility];  Service: Cardiovascular;  Laterality: Right;  SFA UNSUCCESSFUL PTA  UNABLE TO CROSS LESION  . SKIN LESION EXCISION     multiple  . TEE WITHOUT CARDIOVERSION N/A 11/03/2014   Procedure: TRANSESOPHAGEAL ECHOCARDIOGRAM (TEE);  Surgeon: Gaye Pollack, MD;  Location: [facility];  Service: Open Heart Surgery;  Laterality: N/A;    Family History  Problem Relation Age of Onset  . Colon cancer Father   . Heart disease Mother     Social History:  reports that he quit smoking about 3 years ago. His smoking use included cigarettes. He has a 25.00 pack-year smoking history. He has never used smokeless tobacco. He reports that he does not drink alcohol or use drugs.  Review of Systems:   Hypertension:  taking lisinopril 5 mg and carvedilol low-dose and blood pressure is  controlled, managed by cardiologist  Lipids: Well controlled with atorvastatin 40 mg .  HDL is  low, triglycerides  minimally increased, this was discussed   Lab Results  Component Value Date   CHOL 117 10/03/2017   HDL 35.20 (L) 10/03/2017   LDLCALC 48 10/03/2017   TRIG 168.0 (H) 10/03/2017   CHOLHDL 3 10/03/2017    Claudication: He can do some exercise but while playing his pickleball he has to rest for 5 minutes between games     Examination:   BP 106/60   Pulse 83   Ht 6\' 1"  (1.854 m)   Wt 161 lb 3.2 oz (73.1 kg)   SpO2 98%   BMI 21.27 kg/m   Body mass index is 21.27 kg/m.   Diabetic Foot Exam - Simple   Simple Foot Form Visual Inspection No deformities, no ulcerations, no other skin breakdown bilaterally:  Yes Sensation Testing See  comments:  Yes Pulse Check See comments:  Yes Comments Marked decrease in monofilament sensation in the toes bilaterally Minimal callus formation Pedal pulses absent bilaterally      ASSESSMENT/ PLAN:   Diabetes type 2, nonobese:  See history of present illness for detailed discussion of his current management, blood sugar patterns and problems identified  Blood glucose control is fairly good with A1c at 7.1 and continuing to improve  Again taking multiple drugs as prescribed before  Currently he is complaining about the cost of Victoza which is over $200 per month He is doing well with exercise regimen Overall he is cutting back on high calorie and high carbohydrate foods also more often This is probably resulting in weight loss and his BMI is now 21 only  Discussed needing to check blood sugars at all different times including some fasting He will cut back his Victoza to 1.2 mg which will reduce his tendency to lose weight However he will try to be consistent with controlling portions and carbohydrates at breakfast and suppertime  He will try to cut back on donuts also To call if he has any consistently high readings  We will see him back in 3 months with another A1c   Patient Instructions  Rotate testing times  Check blood sugars on waking up  2-3/7  Also check blood sugars about 2 hours after a meal and do this after different meals by rotation  Recommended blood sugar levels on waking up is 90-130 and about 2 hours after meal is 130-160  Please bring your blood sugar monitor to each visit, thank you  Reduce the Victoza  dose to 1.2 mg    [name] 01/03/2018, 7:31 PM

## 2018-01-15 ENCOUNTER — Other Ambulatory Visit: Payer: Self-pay | Admitting: Interventional Cardiology

## 2018-01-16 NOTE — Telephone Encounter (Signed)
Outpatient Medication Detail    Disp Refills Start End   carvedilol (COREG) 3.125 MG tablet 180 tablet 3 01/01/2018    Sig: TAKE 1 TABLET BY MOUTH TWICE A DAY   Sent to pharmacy as: carvedilol (COREG) 3.125 MG tablet   E-Prescribing Status: Receipt confirmed by pharmacy (01/01/2018 11:22 AM EDT)   Pharmacy   [id number] - [place], [place] [address]

## 2018-01-19 DIAGNOSIS — E782 Mixed hyperlipidemia: Secondary | ICD-10-CM | POA: Diagnosis not present

## 2018-01-19 DIAGNOSIS — I10 Essential (primary) hypertension: Secondary | ICD-10-CM | POA: Diagnosis not present

## 2018-01-21 ENCOUNTER — Other Ambulatory Visit: Payer: Self-pay | Admitting: Interventional Cardiology

## 2018-01-21 DIAGNOSIS — I10 Essential (primary) hypertension: Secondary | ICD-10-CM

## 2018-01-23 DIAGNOSIS — M109 Gout, unspecified: Secondary | ICD-10-CM | POA: Diagnosis not present

## 2018-01-23 DIAGNOSIS — I739 Peripheral vascular disease, unspecified: Secondary | ICD-10-CM | POA: Diagnosis not present

## 2018-01-23 DIAGNOSIS — I10 Essential (primary) hypertension: Secondary | ICD-10-CM | POA: Diagnosis not present

## 2018-02-07 ENCOUNTER — Other Ambulatory Visit: Payer: Self-pay | Admitting: Endocrinology

## 2018-02-07 DIAGNOSIS — E782 Mixed hyperlipidemia: Secondary | ICD-10-CM | POA: Diagnosis not present

## 2018-02-07 DIAGNOSIS — I10 Essential (primary) hypertension: Secondary | ICD-10-CM | POA: Diagnosis not present

## 2018-03-01 DIAGNOSIS — R0989 Other specified symptoms and signs involving the circulatory and respiratory systems: Secondary | ICD-10-CM | POA: Diagnosis not present

## 2018-03-01 DIAGNOSIS — L84 Corns and callosities: Secondary | ICD-10-CM | POA: Diagnosis not present

## 2018-03-01 DIAGNOSIS — Z7984 Long term (current) use of oral hypoglycemic drugs: Secondary | ICD-10-CM | POA: Diagnosis not present

## 2018-03-01 DIAGNOSIS — E119 Type 2 diabetes mellitus without complications: Secondary | ICD-10-CM | POA: Diagnosis not present

## 2018-03-13 DIAGNOSIS — Z23 Encounter for immunization: Secondary | ICD-10-CM | POA: Diagnosis not present

## 2018-04-09 ENCOUNTER — Other Ambulatory Visit (INDEPENDENT_AMBULATORY_CARE_PROVIDER_SITE_OTHER): Payer: PPO

## 2018-04-09 DIAGNOSIS — E1165 Type 2 diabetes mellitus with hyperglycemia: Secondary | ICD-10-CM | POA: Diagnosis not present

## 2018-04-09 LAB — COMPREHENSIVE METABOLIC PANEL
ALT: 29 U/L (ref 0–53)
AST: 24 U/L (ref 0–37)
Albumin: 4 g/dL (ref 3.5–5.2)
Alkaline Phosphatase: 50 U/L (ref 39–117)
BUN: 29 mg/dL — ABNORMAL HIGH (ref 6–23)
CO2: 22 mEq/L (ref 19–32)
Calcium: 9 mg/dL (ref 8.4–10.5)
Chloride: 112 mEq/L (ref 96–112)
Creatinine, Ser: 1.18 mg/dL (ref 0.40–1.50)
GFR: 63.62 mL/min (ref >60.00)
Glucose, Bld: 121 mg/dL — ABNORMAL HIGH (ref 70–99)
Potassium: 4.5 mEq/L (ref 3.5–5.1)
Sodium: 141 mEq/L (ref 135–145)
Total Bilirubin: 0.8 mg/dL (ref 0.2–1.2)
Total Protein: 6.1 g/dL (ref 6.0–8.3)

## 2018-04-09 LAB — HEMOGLOBIN A1C: Hgb A1c MFr Bld: 6.7 % — ABNORMAL HIGH (ref 4.6–6.5)

## 2018-04-10 NOTE — Progress Notes (Signed)
Patient ID: Mario [name], male   DOB: 1940-08-01, 77 y.o.   MRN: [id number]   Reason for Appointment: Diabetes follow-up   History of Present Illness   Diagnosis: Type 2 DIABETES MELITUS, date of diagnosis:  1989     Previous history: He was initially diagnosed when hospitalized for pancreatitis and his previous endocrinologist had treated him with multiple medications because of progression of his diabetes. He was also put on Cycloset  which he has tolerated maximum dose He has had adjustments of his medication dosages the last couple of years and Januvia was restarted in 5/14 when blood sugars were higher. Dosages have been adjusted based on renal function Previously an A1c levels have been ranging from 6.6-7.5 His metformin was increased  when renal function was better and glipizide was changed to glipizide ER 10 mg . Did not appear to have better blood sugar control with Invokana He was started on Victoza for improved control when his A1c was 7.9% in 02/2015  Recent history:    Non-insulin hypoglycemic drugs:  Glyset 50 mg bid, glipizide ER 10 mg , Metformin 1 g twice a day,   Victoza 1.8 mg daily       His A1c is  relatively better at 6.7, previously 7.1  Blood sugar patterns and problems:  Although he was told to reduce his Victoza previously because of tendency to weight loss and cost he is now back on 1.8 mg  He says that it is now available through an assistance program and he does not pay anything for the Victoza  He has been exercising at least 3 to 4 days a week consistently  Has no decreased appetite  Despite reminders he does not check his sugars at variable times but only some time after his late breakfast  Fasting glucose however was better in the lab at 121  Weight has improved slightly  No hypoglycemia with glipizide  Side effects from medications: None.  Monitors blood glucose: Once a day or less .    Glucometer: Freestyle     Blood  Glucose readings checked only midday after late breakfast, range 99-160 Average 129   Meals: 2-3 meals per day, no lunch often (yogurt); breakfast is at 9 am cereal, [place] bacon, sometimes bread and eggs at breakfast; donuts occasionally      Dietician consultation last: 3/16        Physical activity: exercise: going to the gym 3+/7,Doing various exercises      Wt Readings from Last 3 Encounters:  04/11/18 165 lb (74.8 kg)  01/03/18 161 lb 3.2 oz (73.1 kg)  12/25/17 163 lb 6.4 oz (74.1 kg)    Lab Results  Component Value Date   HGBA1C 6.7 (H) 04/09/2018   HGBA1C 7.1 (H) 01/01/2018   HGBA1C 7.4 (H) 10/03/2017   Lab Results  Component Value Date   MICROALBUR 9.7 (H) 01/01/2018   LDLCALC 48 10/03/2017   CREATININE 1.18 04/09/2018     LABS:  Lab on 04/09/2018  Component Date Value Ref Range Status  . Hgb A1c MFr Bld 04/09/2018 6.7* 4.6 - 6.5 % Final   Glycemic Control Guidelines for People with Diabetes:Non Diabetic:  <6%Goal of Therapy: <7%Additional Action Suggested:  >8%   . Sodium 04/09/2018 141  135 - 145 mEq/L Final  . Potassium 04/09/2018 4.5  3.5 - 5.1 mEq/L Final  . Chloride 04/09/2018 112  96 - 112 mEq/L Final  . CO2 04/09/2018 22  19 - 32 mEq/L  Final  . Glucose, Bld 04/09/2018 121* 70 - 99 mg/dL Final  . BUN 04/09/2018 29* 6 - 23 mg/dL Final  . Creatinine, Ser 04/09/2018 1.18  0.40 - 1.50 mg/dL Final  . Total Bilirubin 04/09/2018 0.8  0.2 - 1.2 mg/dL Final  . Alkaline Phosphatase 04/09/2018 50  39 - 117 U/L Final  . AST 04/09/2018 24  0 - 37 U/L Final  . ALT 04/09/2018 29  0 - 53 U/L Final  . Total Protein 04/09/2018 6.1  6.0 - 8.3 g/dL Final  . Albumin 04/09/2018 4.0  3.5 - 5.2 g/dL Final  . Calcium 04/09/2018 9.0  8.4 - 10.5 mg/dL Final  . GFR 04/09/2018 63.62  >60.00 mL/min Final    Allergies as of 04/11/2018      Reactions   Penicillins Anaphylaxis   Has patient had a PCN reaction causing immediate rash, facial/tongue/throat swelling, SOB or  lightheadedness with hypotension: Yes Has patient had a PCN reaction causing severe rash involving mucus membranes or skin necrosis: No Has patient had a PCN reaction that required hospitalization: Yes Has patient had a PCN reaction occurring within the last 10 years: Unknown If all of the above answers are "NO", then may proceed with Cephalosporin use.   Plavix [clopidogrel Bisulfate] Hives   Pletal [cilostazol] Itching      Medication List        Accurate as of 04/11/18 11:03 AM. Always use your most recent med list.          acetaminophen 500 MG tablet Commonly known as:  TYLENOL Take 1,000 mg by mouth daily as needed for moderate pain.   allopurinol 100 MG tablet Commonly known as:  ZYLOPRIM Take 1 tablet by mouth daily   ARTIFICIAL TEAR OP Apply 1 drop to eye daily as needed (dry eyes).   aspirin EC 81 MG tablet Take 81 mg by mouth daily.   atorvastatin 40 MG tablet Commonly known as:  LIPITOR take 1 tablet by mouth daily at 6:00pm   calcium carbonate 500 MG chewable tablet Commonly known as:  TUMS - dosed in mg elemental calcium Chew 1 tablet by mouth daily as needed for indigestion or heartburn.   carvedilol 3.125 MG tablet Commonly known as:  COREG TAKE 1 TABLET BY MOUTH TWICE A DAY   CoQ10 200 MG Caps Take 200 mg by mouth daily.   fenofibrate micronized 43 MG capsule Commonly known as:  ANTARA Take 43 mg by mouth every evening.   ferrous sulfate 325 (65 FE) MG tablet Take 325 mg by mouth daily.   finasteride 5 MG tablet Commonly known as:  PROSCAR Take 5 mg by mouth every evening.   FLUAD 0.5 ML Susy Generic drug:  Influenza Vac A&B Surf Ant Adj   folic acid 283 MCG tablet Commonly known as:  FOLVITE Take 400 mcg by mouth daily.   freestyle lancets Use as instructed to check blood sugar 2 times per day dx code E11.65   FREESTYLE LITE Devi USE TO TEST BLOOD SUGAR TWICE DAILY   glipiZIDE 10 MG 24 hr tablet Commonly known as:  GLUCOTROL  XL TAKE 1 TABLET BY MOUTH AT BEDTIME FOR DIABETES.   glucose blood test strip Use as instructed to check blood sugar 2 times per day dx code E11.65   hydrocortisone cream 1 % Apply 1 application topically daily as needed (irritation).   LEG CRAMP RELIEF Tabs Take 1 tablet by mouth daily as needed (leg cramps).   lisinopril 5 MG  tablet Commonly known as:  PRINIVIL,ZESTRIL Take 1 tablet (5 mg total) by mouth daily.   metFORMIN 1000 MG tablet Commonly known as:  GLUCOPHAGE TAKE 1 TABLET BY MOUTH TWICE DAILY FOR BREAKFAST AND DINNER FOR DIABETES.   miglitol 50 MG tablet Commonly known as:  GLYSET TAKE 1 TABLET BY MOUTH TWICE DAILY AT BREAKFAST AND DINNER FOR DIABETES.   multivitamin-lutein Caps capsule Take 1 capsule by mouth 2 (two) times daily.   tamsulosin 0.4 MG Caps capsule Commonly known as:  FLOMAX Take 0.4 mg by mouth 2 (two) times daily.   VICTOZA 18 MG/3ML Sopn Generic drug:  liraglutide INJECT 1.8MG  INTO THE SKIN DAILY AT DINNERTIME FOR DIABETES.   vitamin B-12 1000 MCG tablet Commonly known as:  CYANOCOBALAMIN Take 1,000 mcg by mouth daily.   Vitamin D 50 MCG (2000 UT) Caps Take 2,000 Units by mouth daily.       Allergies:  Allergies  Allergen Reactions  . Penicillins Anaphylaxis    Has patient had a PCN reaction causing immediate rash, facial/tongue/throat swelling, SOB or lightheadedness with hypotension: Yes Has patient had a PCN reaction causing severe rash involving mucus membranes or skin necrosis: No Has patient had a PCN reaction that required hospitalization: Yes Has patient had a PCN reaction occurring within the last 10 years: Unknown If all of the above answers are "NO", then may proceed with Cephalosporin use.   Marland Kitchen Plavix [Clopidogrel Bisulfate] Hives  . Pletal [Cilostazol] Itching    Past Medical History:  Diagnosis Date  . Arthritis   . CAD (coronary artery disease)    a.  s/p IMI and BMS 1997;  b. Myoview 4/16:  inf scar with  peri-infarct ischemia, EF 34%; high risk;  c. LHC 5/16:  3 v CAD >> CABG (free L-LAD, S-OM, S-dRCA)  . Carotid stenosis    a. carotid US 6/16:  bilat ICA 1-39%  . Chronic kidney disease, stage II (mild)   . Chronic systolic CHF (congestive heart failure) ([facility])   . Essential hypertension, benign   . Gout   . HLD (hyperlipidemia)   . Ischemic cardiomyopathy    a. EF by myoview 4/16 34%; b. LHC 5/16: EF 40-45%;  c. intraop TEE 6/16: EF 45-50%  . Orthostasis   . PAD (peripheral artery disease) ([facility])    Dr. Fletcher Anon  . Pneumonia    hx of  . Type II or unspecified type diabetes mellitus without mention of complication, uncontrolled     Past Surgical History:  Procedure Laterality Date  . ABDOMINAL AORTAGRAM N/A 10/09/2013   Procedure: ABDOMINAL Maxcine Ham;  Surgeon: Wellington Hampshire, MD;  Location: [facility] CATH [facility];  Service: Cardiovascular;  Laterality: N/A;  . ABDOMINAL AORTOGRAM N/A 12/28/2016   Procedure: ABDOMINAL AORTOGRAM;  Surgeon: Waynetta Sandy, MD;  Location: [facility];  Service: Cardiovascular;  Laterality: N/A;  . CARDIAC CATHETERIZATION N/A 10/02/2014   Procedure: Left Heart Cath and Coronary Angiography;  Surgeon: Belva Crome, MD;  Location: [facility];  Service: Cardiovascular;  Laterality: N/A;  . CATARACT EXTRACTION    . CHOLECYSTECTOMY    . COLONOSCOPY    . CORONARY ARTERY BYPASS GRAFT N/A 11/03/2014   Procedure: CORONARY ARTERY BYPASS GRAFTING  times three using left internal mammary and right greater saphenous vein;  Surgeon: Gaye Pollack, MD;  Location: [facility];  Service: Open Heart Surgery;  Laterality: N/A;  . HERNIA REPAIR    . LOWER EXTREMITY ANGIOGRAPHY Bilateral 12/28/2016   Procedure:  Lower Extremity Angiography;  Surgeon: Waynetta Sandy, MD;  Location: [facility];  Service: Cardiovascular;  Laterality: Bilateral;  . MELANOMA EXCISION    . PERIPHERAL VASCULAR BALLOON ANGIOPLASTY Right 12/28/2016   Procedure: PERIPHERAL VASCULAR  BALLOON ANGIOPLASTY;  Surgeon: Waynetta Sandy, MD;  Location: [facility];  Service: Cardiovascular;  Laterality: Right;  SFA UNSUCCESSFUL PTA  UNABLE TO CROSS LESION  . SKIN LESION EXCISION     multiple  . TEE WITHOUT CARDIOVERSION N/A 11/03/2014   Procedure: TRANSESOPHAGEAL ECHOCARDIOGRAM (TEE);  Surgeon: Gaye Pollack, MD;  Location: [facility];  Service: Open Heart Surgery;  Laterality: N/A;    Family History  Problem Relation Age of Onset  . Colon cancer Father   . Heart disease Mother     Social History:  reports that he quit smoking about 3 years ago. His smoking use included cigarettes. He has a 25.00 pack-year smoking history. He has never used smokeless tobacco. He reports that he does not drink alcohol or use drugs.  Review of Systems:   Hypertension:  taking lisinopril 5 mg and carvedilol low-dose and blood pressure is  controlled, managed by cardiologist  Lipids: Well controlled with atorvastatin 40 mg, now taking 1/2 daily .  HDL is  low, triglycerides  minimally increased His family member told him his cholesterol was too low and told him to cut the Lipitor in half   Lab Results  Component Value Date   CHOL 117 10/03/2017   HDL 35.20 (L) 10/03/2017   LDLCALC 48 10/03/2017   TRIG 168.0 (H) 10/03/2017   CHOLHDL 3 10/03/2017    Claudication: Stable symptoms currently     Examination:   BP 134/68   Pulse 75   Ht 6\' 2"  (1.88 m)   Wt 165 lb (74.8 kg)   SpO2 99%   BMI 21.18 kg/m   Body mass index is 21.18 kg/m.     ASSESSMENT/ PLAN:   Diabetes type 2, nonobese:  See history of present illness for detailed discussion of his current management, blood sugar patterns and problems identified  Blood glucose control is excellent and now below 7%  No side effects from Victoza or Precose He is able to afford this now Controlled may be better because he is doing a little better with his diet and he is consistent with his exercise  Reminded him  to start checking blood sugar by rotation either fasting or after lunch or dinner more often To call if he has any consistently abnormal patterns  LIPIDS: Discussed that because of his atherosclerotic vascular disease he needs to be on high intensity statin and it is safe to take 40 mg daily of Lipitor   There are no Patient Instructions on file for this visit.   [name] 04/11/2018, 11:03 AM

## 2018-04-11 ENCOUNTER — Encounter: Payer: Self-pay | Admitting: Endocrinology

## 2018-04-11 ENCOUNTER — Ambulatory Visit (INDEPENDENT_AMBULATORY_CARE_PROVIDER_SITE_OTHER): Payer: PPO | Admitting: Endocrinology

## 2018-04-11 VITALS — BP 134/68 | HR 75 | Ht 74.0 in | Wt 165.0 lb

## 2018-04-11 DIAGNOSIS — E1151 Type 2 diabetes mellitus with diabetic peripheral angiopathy without gangrene: Secondary | ICD-10-CM

## 2018-04-11 DIAGNOSIS — E782 Mixed hyperlipidemia: Secondary | ICD-10-CM

## 2018-04-11 NOTE — Patient Instructions (Signed)
Check blood sugars on waking up days a week  Also check blood sugars about 2 hours after meals and do this after different meals by rotation  Recommended blood sugar levels on waking up are 90-130 and about 2 hours after meal is 130-160  Please bring your blood sugar monitor to each visit, thank you   

## 2018-04-30 ENCOUNTER — Ambulatory Visit: Payer: PPO | Admitting: Family

## 2018-04-30 ENCOUNTER — Ambulatory Visit (HOSPITAL_COMMUNITY)
Admission: RE | Admit: 2018-04-30 | Discharge: 2018-04-30 | Disposition: A | Discharge status: Home / Self Care | Payer: PPO | Source: Ambulatory Visit | Attending: Family | Admitting: Family

## 2018-04-30 ENCOUNTER — Encounter: Payer: Self-pay | Admitting: Family

## 2018-04-30 VITALS — BP 178/79 | HR 68 | Temp 96.6°F | Resp 14 | Ht 74.0 in | Wt 168.0 lb

## 2018-04-30 DIAGNOSIS — I739 Peripheral vascular disease, unspecified: Secondary | ICD-10-CM | POA: Diagnosis not present

## 2018-04-30 DIAGNOSIS — Z87891 Personal history of nicotine dependence: Secondary | ICD-10-CM | POA: Diagnosis not present

## 2018-04-30 DIAGNOSIS — I779 Disorder of arteries and arterioles, unspecified: Secondary | ICD-10-CM | POA: Diagnosis not present

## 2018-04-30 DIAGNOSIS — I7025 Atherosclerosis of native arteries of other extremities with ulceration: Secondary | ICD-10-CM | POA: Diagnosis present

## 2018-04-30 DIAGNOSIS — R1909 Other intra-abdominal and pelvic swelling, mass and lump: Secondary | ICD-10-CM | POA: Diagnosis not present

## 2018-04-30 DIAGNOSIS — I6523 Occlusion and stenosis of bilateral carotid arteries: Secondary | ICD-10-CM

## 2018-04-30 NOTE — Patient Instructions (Signed)

## 2018-04-30 NOTE — Progress Notes (Signed)
[facility] [place]   CC: Follow up peripheral artery occlusive disease  History of Present Illness Mario [name] is a 77 y.o. male who is s/p angiogram of RLE with attempted intervention on 12-28-16 by Dr. [name].  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   He continues to play pickle ball regularly.   Dr. [name] last evaluated pt on 04-28-17. At that time wound on right medial malleolus. This was BCC by biopsy and will need Moh's surgery. Hopefully will heal without issues. If he fails to heal wound would need SFA or fem-pop bypass with either small saphenous or arm vein.Pt was to return in 6 months unless wound cannot heal.  He had the Moh's procedure to right medial malleolus in February or March 2019; skin graft was placed over medial malleolus, and pt states last week he scraped this area and the lateral aspect of his left foot in a public pool.  [facility] is his dermatologist, Dr. [name].   Pt has been washing both feet wounds daily and applying a silver preparation to wounds on feet, now covered with large band aids. No erythema surrounding the band aids, no swelling.  Pt denies fever or chills.   After about 15 minutes of playing pickle ball, both thighs will start to hurt; this has worsened from calves hurting after about 5 minutes of playing, relieved by 5 minutes rest.   He denies tingling, numbness, cold sensation in either upper extremity.   He has a hx of gout in the left great toe.   He denies chest pain of dyspnea.   He denies any history of stroke. He had a CABG in 2016, quit smoking then.   Diabetic: Yes, A1C was 6.7 on 04-09-18 Tobacco use: former smoker, quit in 2016, smoked x 50 years   Pt meds include: Statin :Yes Betablocker: Yes ASA: Yes Other anticoagulants/antiplatelets: states he is allergic to one blood thinner, plavix and pletal listed in his  allergy list    Past Medical History:  Diagnosis Date  . Arthritis   . CAD (coronary artery disease)    a.  s/p IMI and BMS 1997;  b. Myoview 4/16:  inf scar with peri-infarct ischemia, EF 34%; high risk;  c. LHC 5/16:  3 v CAD >> CABG (free L-LAD, S-OM, S-dRCA)  . Carotid stenosis    a. carotid US 6/16:  bilat ICA 1-39%  . Chronic kidney disease, stage II (mild)   . Chronic systolic CHF (congestive heart failure) ([place])   . Essential hypertension, benign   . Gout   . HLD (hyperlipidemia)   . Ischemic cardiomyopathy    a. EF by myoview 4/16 34%; b. LHC 5/16: EF 40-45%;  c. intraop TEE 6/16: EF 45-50%  . Orthostasis   . PAD (peripheral artery disease) ([facility])    Dr. Fletcher Anon  . Pneumonia    hx of  . Type II or unspecified type diabetes mellitus without mention of complication, uncontrolled     Social History Social History   Tobacco Use  . Smoking status: Former Smoker    Packs/day: 0.50    Years: 50.00    Pack years: 25.00    Types: Cigarettes    Last attempt to quit: 10/22/2014    Years since quitting: 3.5  . Smokeless tobacco: Never Used  Substance Use Topics  . Alcohol use: No    Alcohol/week: 0.0  standard drinks  . Drug use: No    Family History Family History  Problem Relation Age of Onset  . Colon cancer Father   . Heart disease Mother     Past Surgical History:  Procedure Laterality Date  . ABDOMINAL AORTAGRAM N/A 10/09/2013   Procedure: ABDOMINAL Maxcine Ham;  Surgeon: Wellington Hampshire, MD;  Location: [facility];  Service: Cardiovascular;  Laterality: N/A;  . ABDOMINAL AORTOGRAM N/A 12/28/2016   Procedure: ABDOMINAL AORTOGRAM;  Surgeon: Waynetta Sandy, MD;  Location: [facility];  Service: Cardiovascular;  Laterality: N/A;  . CARDIAC CATHETERIZATION N/A 10/02/2014   Procedure: Left Heart Cath and Coronary Angiography;  Surgeon: Belva Crome, MD;  Location: [facility];  Service: Cardiovascular;  Laterality: N/A;  . CATARACT EXTRACTION     . CHOLECYSTECTOMY    . COLONOSCOPY    . CORONARY ARTERY BYPASS GRAFT N/A 11/03/2014   Procedure: CORONARY ARTERY BYPASS GRAFTING  times three using left internal mammary and right greater saphenous vein;  Surgeon: Gaye Pollack, MD;  Location: [facility];  Service: Open Heart Surgery;  Laterality: N/A;  . HERNIA REPAIR    . LOWER EXTREMITY ANGIOGRAPHY Bilateral 12/28/2016   Procedure: Lower Extremity Angiography;  Surgeon: Waynetta Sandy, MD;  Location: [facility];  Service: Cardiovascular;  Laterality: Bilateral;  . MELANOMA EXCISION    . PERIPHERAL VASCULAR BALLOON ANGIOPLASTY Right 12/28/2016   Procedure: PERIPHERAL VASCULAR BALLOON ANGIOPLASTY;  Surgeon: Waynetta Sandy, MD;  Location: [facility];  Service: Cardiovascular;  Laterality: Right;  SFA UNSUCCESSFUL PTA  UNABLE TO CROSS LESION  . SKIN LESION EXCISION     multiple  . TEE WITHOUT CARDIOVERSION N/A 11/03/2014   Procedure: TRANSESOPHAGEAL ECHOCARDIOGRAM (TEE);  Surgeon: Gaye Pollack, MD;  Location: [facility];  Service: Open Heart Surgery;  Laterality: N/A;    Allergies  Allergen Reactions  . Penicillins Anaphylaxis    Has patient had a PCN reaction causing immediate rash, facial/tongue/throat swelling, SOB or lightheadedness with hypotension: Yes Has patient had a PCN reaction causing severe rash involving mucus membranes or skin necrosis: No Has patient had a PCN reaction that required hospitalization: Yes Has patient had a PCN reaction occurring within the last 10 years: Unknown If all of the above answers are "NO", then may proceed with Cephalosporin use.   Marland Kitchen Plavix [Clopidogrel Bisulfate] Hives  . Pletal [Cilostazol] Itching    Current Outpatient Medications  Medication Sig Dispense Refill  . acetaminophen (TYLENOL) 500 MG tablet Take 1,000 mg by mouth daily as needed for moderate pain.    Marland Kitchen allopurinol (ZYLOPRIM) 100 MG tablet Take 1 tablet by mouth daily 90 tablet 0  . ARTIFICIAL TEAR OP  Apply 1 drop to eye daily as needed (dry eyes).    Marland Kitchen aspirin EC 81 MG tablet Take 81 mg by mouth daily.    Marland Kitchen atorvastatin (LIPITOR) 40 MG tablet take 1 tablet by mouth daily at 6:00pm  90 tablet 0  . Blood Glucose Monitoring Suppl (FREESTYLE LITE) DEVI USE TO TEST BLOOD SUGAR TWICE DAILY 1 each 1  . calcium carbonate (TUMS - DOSED IN MG ELEMENTAL CALCIUM) 500 MG chewable tablet Chew 1 tablet by mouth daily as needed for indigestion or heartburn.    . carvedilol (COREG) 3.125 MG tablet TAKE 1 TABLET BY MOUTH TWICE A DAY 180 tablet 3  . Cholecalciferol (VITAMIN D) 2000 units CAPS Take 2,000 Units by mouth daily.    . Coenzyme  Q10 (COQ10) 200 MG CAPS Take 200 mg by mouth daily.    . fenofibrate micronized (ANTARA) 43 MG capsule Take 43 mg by mouth every evening.    . ferrous sulfate 325 (65 FE) MG tablet Take 325 mg by mouth daily.    . finasteride (PROSCAR) 5 MG tablet Take 5 mg by mouth every evening.   3  . FLUAD 0.5 ML SUSY     . folic acid (FOLVITE) 034 MCG tablet Take 400 mcg by mouth daily.    Marland Kitchen glipiZIDE (GLUCOTROL XL) 10 MG 24 hr tablet TAKE 1 TABLET BY MOUTH AT BEDTIME FOR DIABETES. 30 tablet 2  . glucose blood (FREESTYLE LITE) test strip Use as instructed to check blood sugar 2 times per day dx code E11.65 100 each 3  . Homeopathic Products (LEG CRAMP RELIEF) TABS Take 1 tablet by mouth daily as needed (leg cramps).    . hydrocortisone cream 1 % Apply 1 application topically daily as needed (irritation).    . Lancets (FREESTYLE) lancets Use as instructed to check blood sugar 2 times per day dx code E11.65 100 each 3  . lisinopril (PRINIVIL,ZESTRIL) 5 MG tablet Take 1 tablet (5 mg total) by mouth daily. 90 tablet 3  . metFORMIN (GLUCOPHAGE) 1000 MG tablet TAKE 1 TABLET BY MOUTH TWICE DAILY FOR BREAKFAST AND DINNER FOR DIABETES. 60 tablet 2  . miglitol (GLYSET) 50 MG tablet TAKE 1 TABLET BY MOUTH TWICE DAILY AT BREAKFAST AND DINNER FOR DIABETES. 60 tablet 2  . multivitamin-lutein  (OCUVITE-LUTEIN) CAPS capsule Take 1 capsule by mouth 2 (two) times daily.    . tamsulosin (FLOMAX) 0.4 MG CAPS capsule Take 0.4 mg by mouth 2 (two) times daily.    Marland Kitchen VICTOZA 18 MG/3ML SOPN INJECT 1.8MG  INTO THE SKIN DAILY AT DINNERTIME FOR DIABETES. 9 mL 2  . vitamin B-12 (CYANOCOBALAMIN) 1000 MCG tablet Take 1,000 mcg by mouth daily.     No current facility-administered medications for this visit.     ROS: See HPI for pertinent positives and negatives.   Physical Examination  Vitals:   04/30/18 1133 04/30/18 1139  BP: 134/65 (!) 178/79  Pulse: 68   Resp: 14   Temp: (!) 96.6 F (35.9 C)   SpO2: 100%   Weight: 168 lb (76.2 kg)   Height: 6\' 2"  (1.88 m)    Body mass index is 21.57 kg/m.  General: A&O x 3, WDWN, elderly male. Gait: normal HENT: No gross abnormalities.  Eyes: PERRLA. Pulmonary: Respirations are slightly labored at rest, diminished air movement in all fields, no rales, rhonchi, or wheezes.  Cardiac: regular rhythm, no detected murmur.         Carotid Bruits Right Left   Negative positive   Radial pulses are 2+ palpable bilaterally   Adominal aortic pulse is not palpable                         VASCULAR EXAM: Extremities without ischemic changes, without Gangrene; with open wounds right medial malleolus with healed skin graft.  LE Pulses Right Left       FEMORAL  2+ palpable  2+ palpable        POPLITEAL  not palpable   not palpable       POSTERIOR TIBIAL  not palpable   not palpable        DORSALIS PEDIS      ANTERIOR TIBIAL not palpable  not palpable    Abdomen: soft, NT, moveable mass in right groin, with irregular edges, measures about 1.5 cm x 2 cm. Skin: no rashes, no cellulitis, no ulcers noted. Musculoskeletal: no muscle wasting or atrophy.      Neurologic: A&O X 3; appropriate  affect, Sensation is normal; MOTOR FUNCTION:  moving all extremities equally, motor strength 5/5 throughout. Speech is fluent/normal. CN 2-12 intact except for some hearing loss. Psychiatric: Thought content is normal, mood appropriate for clinical situation    ASSESSMENT: DAMAR PETIT is a 77 y.o. male whom Dr. [name] evaluated for a lesion on pt right medial malleolus, which biopsy demonstrated as BCC. Pt had a Moh's procedure to this, with a skin graft afterward. Pt then scraped this skin graft and also the lateral aspect of his left foot in a public swimming pool last week. Pt has been washing these wounds daily with soap and water, and using a topical silver preparation, on these wounds which seem to be healing.  I advised pt to notify his dermatologist re this, Dr. [name].   Dr. [name] indicated in his progress note of 04-28-17, that if he fails to heal wound would need SFA or fem-pop bypass with either small saphenous or arm vein.  After 15 minutes of playing pickle ball both thighs will hurt, relieved by 5 minutes rest.   There are no signs of ischemia in his feet or legs.   He has a moveable mass in his right groin, with irregular edges, measures about 1.5 cm x 2 cm that he noticed a couple of months ago, on exam does not seem like a hernia, is not painful. He states he has no lumps or masses noted elsewhere.  I advised pt to call his PCP's office today about this.   He has a left carotid bruit not noted at his last visit, he denies any known hx of stroke or TIA. Last carotid duplex on file was in 2014, see results below, with 50-69% left ICA stenosis.    DATA  ABI (Date: 04/30/2018):  R:   ABI: [place] (was [place] on 11-07-17),   PT: mono  DP: bi  TBI:  0.53, toe pressure 79, (was 0.44)  L:   ABI: [place]  (was [place]),   PT: mono  DP: mono  TBI: 0.33, toe pressure 49, (was 0.32) Non compressible vessels bilaterally, indicates calcified vessels. Right ankle with bi and monophasic  waveforms, left with monophasic.  Stable bilateral TBI.  Carotid Duplex 07-20-12: Moderate atherosclerotic plaque at both carotid bifurcations with estimated 50 - 69% left ICA stenosis and less than 50% right ICA Stenosis.  Aortogram 12-28-16: Findings:The aorta iliac and common femoral arteries are heavily calcified bilaterally. The left common femoral artery has a subtotal occlusion in the right common femoral artery has at least 50% stenosis. The right lower extremity the SFA occludes at its extreme distal point in the adductor canal and reconstitutes a popliteal artery at the knee with dominant runoff to the foot via the anterior tibial artery. The left lower extremity has occlusion of the popliteal artery that a short  segment and reconstituted at the knee with also dominant runoff via the anterior tibial artery. We were unable to cross the heavily calcified area in the right popliteal artery and no intervention was undertaken.    PLAN:  Based on the patient's vascular studies and examination, pt will return to clinic in 6 months with ABI's and carotid duplex. I advised pt to walk at least 30 minutes total daily, and to notify us if he develops concerns re the circulation in his feet or legs.  Protect feet and ankles at all times.   I discussed in depth with the patient the nature of atherosclerosis, and emphasized the importance of maximal medical management including strict control of blood pressure, blood glucose, and lipid levels, obtaining regular exercise, and continued cessation of smoking.  The patient is aware that without maximal medical management the underlying atherosclerotic disease process will progress, limiting the benefit of any interventions.  The patient was given information about PAD including signs, symptoms, treatment, what symptoms should prompt the patient to seek immediate medical care, and risk reduction measures to take.  Clemon Chambers, RN, MSN, FNP-C [facility] Phone: [phone]  Clinic MD: [name]  04/30/18 11:53 AM

## 2018-05-01 ENCOUNTER — Other Ambulatory Visit: Payer: Self-pay | Admitting: Internal Medicine

## 2018-05-01 DIAGNOSIS — R1909 Other intra-abdominal and pelvic swelling, mass and lump: Secondary | ICD-10-CM | POA: Diagnosis not present

## 2018-05-03 ENCOUNTER — Ambulatory Visit
Admission: RE | Admit: 2018-05-03 | Discharge: 2018-05-03 | Disposition: A | Discharge status: Home / Self Care | Payer: PPO | Source: Ambulatory Visit | Attending: Internal Medicine | Admitting: Internal Medicine

## 2018-05-03 DIAGNOSIS — R1909 Other intra-abdominal and pelvic swelling, mass and lump: Secondary | ICD-10-CM

## 2018-05-07 DIAGNOSIS — E119 Type 2 diabetes mellitus without complications: Secondary | ICD-10-CM | POA: Diagnosis not present

## 2018-05-07 DIAGNOSIS — R234 Changes in skin texture: Secondary | ICD-10-CM | POA: Diagnosis not present

## 2018-05-07 DIAGNOSIS — Z7984 Long term (current) use of oral hypoglycemic drugs: Secondary | ICD-10-CM | POA: Diagnosis not present

## 2018-05-07 DIAGNOSIS — L84 Corns and callosities: Secondary | ICD-10-CM | POA: Diagnosis not present

## 2018-05-18 DIAGNOSIS — E782 Mixed hyperlipidemia: Secondary | ICD-10-CM | POA: Diagnosis not present

## 2018-05-18 DIAGNOSIS — I10 Essential (primary) hypertension: Secondary | ICD-10-CM | POA: Diagnosis not present

## 2018-05-22 ENCOUNTER — Other Ambulatory Visit: Payer: Self-pay | Admitting: Dermatology

## 2018-05-22 DIAGNOSIS — C44722 Squamous cell carcinoma of skin of right lower limb, including hip: Secondary | ICD-10-CM | POA: Diagnosis not present

## 2018-06-07 DIAGNOSIS — I739 Peripheral vascular disease, unspecified: Secondary | ICD-10-CM | POA: Diagnosis not present

## 2018-06-07 DIAGNOSIS — Z951 Presence of aortocoronary bypass graft: Secondary | ICD-10-CM | POA: Diagnosis not present

## 2018-06-07 DIAGNOSIS — E785 Hyperlipidemia, unspecified: Secondary | ICD-10-CM | POA: Diagnosis not present

## 2018-06-07 DIAGNOSIS — R590 Localized enlarged lymph nodes: Secondary | ICD-10-CM | POA: Diagnosis not present

## 2018-06-07 DIAGNOSIS — I129 Hypertensive chronic kidney disease with stage 1 through stage 4 chronic kidney disease, or unspecified chronic kidney disease: Secondary | ICD-10-CM | POA: Diagnosis not present

## 2018-06-07 DIAGNOSIS — I2510 Atherosclerotic heart disease of native coronary artery without angina pectoris: Secondary | ICD-10-CM | POA: Diagnosis not present

## 2018-06-07 DIAGNOSIS — E1122 Type 2 diabetes mellitus with diabetic chronic kidney disease: Secondary | ICD-10-CM | POA: Diagnosis not present

## 2018-06-07 DIAGNOSIS — C44701 Unspecified malignant neoplasm of skin of unspecified lower limb, including hip: Secondary | ICD-10-CM | POA: Diagnosis not present

## 2018-06-07 DIAGNOSIS — I10 Essential (primary) hypertension: Secondary | ICD-10-CM | POA: Diagnosis not present

## 2018-06-08 ENCOUNTER — Other Ambulatory Visit: Payer: Self-pay | Admitting: General Surgery

## 2018-06-08 ENCOUNTER — Other Ambulatory Visit (HOSPITAL_COMMUNITY): Payer: Self-pay | Admitting: General Surgery

## 2018-06-08 DIAGNOSIS — R590 Localized enlarged lymph nodes: Secondary | ICD-10-CM

## 2018-06-18 DIAGNOSIS — E782 Mixed hyperlipidemia: Secondary | ICD-10-CM | POA: Diagnosis not present

## 2018-06-18 DIAGNOSIS — I10 Essential (primary) hypertension: Secondary | ICD-10-CM | POA: Diagnosis not present

## 2018-06-19 DIAGNOSIS — R0989 Other specified symptoms and signs involving the circulatory and respiratory systems: Secondary | ICD-10-CM | POA: Diagnosis not present

## 2018-06-19 DIAGNOSIS — S90415A Abrasion, left lesser toe(s), initial encounter: Secondary | ICD-10-CM | POA: Diagnosis not present

## 2018-06-20 ENCOUNTER — Other Ambulatory Visit: Payer: Self-pay | Admitting: Radiology

## 2018-06-21 ENCOUNTER — Other Ambulatory Visit: Payer: Self-pay | Admitting: Radiology

## 2018-06-21 DIAGNOSIS — C44722 Squamous cell carcinoma of skin of right lower limb, including hip: Secondary | ICD-10-CM | POA: Diagnosis not present

## 2018-06-22 ENCOUNTER — Ambulatory Visit (HOSPITAL_COMMUNITY)
Admission: RE | Admit: 2018-06-22 | Discharge: 2018-06-22 | Disposition: A | Discharge status: Home / Self Care | Payer: PPO | Source: Ambulatory Visit | Attending: General Surgery | Admitting: General Surgery

## 2018-06-22 ENCOUNTER — Encounter (HOSPITAL_COMMUNITY): Payer: Self-pay

## 2018-06-22 DIAGNOSIS — Z8249 Family history of ischemic heart disease and other diseases of the circulatory system: Secondary | ICD-10-CM | POA: Insufficient documentation

## 2018-06-22 DIAGNOSIS — E785 Hyperlipidemia, unspecified: Secondary | ICD-10-CM | POA: Insufficient documentation

## 2018-06-22 DIAGNOSIS — N183 Chronic kidney disease, stage 3 (moderate): Secondary | ICD-10-CM | POA: Insufficient documentation

## 2018-06-22 DIAGNOSIS — Z800 Family history of malignant neoplasm of digestive organs: Secondary | ICD-10-CM | POA: Insufficient documentation

## 2018-06-22 DIAGNOSIS — Z87891 Personal history of nicotine dependence: Secondary | ICD-10-CM | POA: Insufficient documentation

## 2018-06-22 DIAGNOSIS — Z9862 Peripheral vascular angioplasty status: Secondary | ICD-10-CM | POA: Diagnosis not present

## 2018-06-22 DIAGNOSIS — Z951 Presence of aortocoronary bypass graft: Secondary | ICD-10-CM | POA: Insufficient documentation

## 2018-06-22 DIAGNOSIS — Z7982 Long term (current) use of aspirin: Secondary | ICD-10-CM | POA: Insufficient documentation

## 2018-06-22 DIAGNOSIS — I2510 Atherosclerotic heart disease of native coronary artery without angina pectoris: Secondary | ICD-10-CM | POA: Diagnosis not present

## 2018-06-22 DIAGNOSIS — M109 Gout, unspecified: Secondary | ICD-10-CM | POA: Diagnosis not present

## 2018-06-22 DIAGNOSIS — E1151 Type 2 diabetes mellitus with diabetic peripheral angiopathy without gangrene: Secondary | ICD-10-CM | POA: Diagnosis not present

## 2018-06-22 DIAGNOSIS — Z7984 Long term (current) use of oral hypoglycemic drugs: Secondary | ICD-10-CM | POA: Diagnosis not present

## 2018-06-22 DIAGNOSIS — M1990 Unspecified osteoarthritis, unspecified site: Secondary | ICD-10-CM | POA: Insufficient documentation

## 2018-06-22 DIAGNOSIS — C801 Malignant (primary) neoplasm, unspecified: Secondary | ICD-10-CM | POA: Diagnosis not present

## 2018-06-22 DIAGNOSIS — R590 Localized enlarged lymph nodes: Secondary | ICD-10-CM | POA: Diagnosis present

## 2018-06-22 DIAGNOSIS — E1122 Type 2 diabetes mellitus with diabetic chronic kidney disease: Secondary | ICD-10-CM | POA: Insufficient documentation

## 2018-06-22 DIAGNOSIS — C774 Secondary and unspecified malignant neoplasm of inguinal and lower limb lymph nodes: Secondary | ICD-10-CM | POA: Diagnosis not present

## 2018-06-22 DIAGNOSIS — I5022 Chronic systolic (congestive) heart failure: Secondary | ICD-10-CM | POA: Insufficient documentation

## 2018-06-22 DIAGNOSIS — I130 Hypertensive heart and chronic kidney disease with heart failure and stage 1 through stage 4 chronic kidney disease, or unspecified chronic kidney disease: Secondary | ICD-10-CM | POA: Diagnosis not present

## 2018-06-22 DIAGNOSIS — Z8701 Personal history of pneumonia (recurrent): Secondary | ICD-10-CM | POA: Diagnosis not present

## 2018-06-22 DIAGNOSIS — I255 Ischemic cardiomyopathy: Secondary | ICD-10-CM | POA: Diagnosis not present

## 2018-06-22 DIAGNOSIS — Z79899 Other long term (current) drug therapy: Secondary | ICD-10-CM | POA: Insufficient documentation

## 2018-06-22 LAB — CBC
HCT: 32.3 % — ABNORMAL LOW (ref 39.0–52.0)
Hemoglobin: 9.8 g/dL — ABNORMAL LOW (ref 13.0–17.0)
MCH: 28.4 pg (ref 26.0–34.0)
MCHC: 30.3 g/dL (ref 30.0–36.0)
MCV: 93.6 fL (ref 80.0–100.0)
Platelets: 179 10*3/uL (ref 150–400)
RBC: 3.45 MIL/uL — ABNORMAL LOW (ref 4.22–5.81)
RDW: 13.2 % (ref 11.5–15.5)
WBC: 7.2 10*3/uL (ref 4.0–10.5)
nRBC: 0 % (ref 0.0–0.2)

## 2018-06-22 LAB — PROTIME-INR
INR: 1.02
Prothrombin Time: 13.3 seconds (ref 11.4–15.2)

## 2018-06-22 LAB — GLUCOSE, CAPILLARY: Glucose-Capillary: 161 mg/dL — ABNORMAL HIGH (ref 70–99)

## 2018-06-22 MED ORDER — FENTANYL CITRATE (PF) 100 MCG/2ML IJ SOLN
INTRAMUSCULAR | Status: AC
  Filled 2018-06-22: qty 2

## 2018-06-22 MED ORDER — MIDAZOLAM HCL 2 MG/2ML IJ SOLN
INTRAMUSCULAR | Status: AC
  Filled 2018-06-22: qty 2

## 2018-06-22 MED ORDER — MIDAZOLAM HCL 2 MG/2ML IJ SOLN
INTRAMUSCULAR | Status: AC | PRN
  Administered 2018-06-22: 1 mg via INTRAVENOUS

## 2018-06-22 MED ORDER — FENTANYL CITRATE (PF) 100 MCG/2ML IJ SOLN
INTRAMUSCULAR | Status: AC | PRN
  Administered 2018-06-22: 50 ug via INTRAVENOUS

## 2018-06-22 MED ORDER — SODIUM CHLORIDE 0.9 % IV SOLN: INTRAVENOUS | Status: DC

## 2018-06-22 MED ORDER — LIDOCAINE HCL (PF) 1 % IJ SOLN
INTRAMUSCULAR | Status: AC
  Filled 2018-06-22: qty 30

## 2018-06-22 NOTE — H&P (Signed)
Chief Complaint: Patient was seen in consultation today for right groin mass biopsy at the request of Humphrey  Referring Physician(s): Ingram,Haywood  Supervising Physician: Marybelle Killings  Patient Status: [facility] - Out-pt  History of Present Illness: Mario [name] is a 78 y.o. male   Noted enlargement in Rt groin for weeks Not any larger-- no real pain Was asked about it at Vein specialist Was referred to surgeon who referred for biopsy first  IMPRESSION: 2.7 x 1.8 x 4 cm solid hypoechoic mass in the right groin corresponding to palpable mass. Findings could be secondary to abnormally enlarged lymph nodes versus other soft tissue mass. Consider tissue sampling for further evaluation    Past Medical History:  Diagnosis Date  . Arthritis   . CAD (coronary artery disease)    a.  s/p IMI and BMS 1997;  b. Myoview 4/16:  inf scar with peri-infarct ischemia, EF 34%; high risk;  c. LHC 5/16:  3 v CAD >> CABG (free L-LAD, S-OM, S-dRCA)  . Carotid stenosis    a. carotid US 6/16:  bilat ICA 1-39%  . Chronic kidney disease, stage II (mild)   . Chronic systolic CHF (congestive heart failure) ([place])   . Essential hypertension, benign   . Gout   . HLD (hyperlipidemia)   . Ischemic cardiomyopathy    a. EF by myoview 4/16 34%; b. LHC 5/16: EF 40-45%;  c. intraop TEE 6/16: EF 45-50%  . Orthostasis   . PAD (peripheral artery disease) (H[facility])    Dr. Fletcher Anon  . Pneumonia    hx of  . Type II or unspecified type diabetes mellitus without mention of complication, uncontrolled     Past Surgical History:  Procedure Laterality Date  . ABDOMINAL AORTAGRAM N/A 10/09/2013   Procedure: ABDOMINAL Maxcine Ham;  Surgeon: Wellington Hampshire, MD;  Location: [place] CATH LAB;  Service: Cardiovascular;  Laterality: N/A;  . ABDOMINAL AORTOGRAM N/A 12/28/2016   Procedure: ABDOMINAL AORTOGRAM;  Surgeon: Waynetta Sandy, MD;  Location: [place] CV LAB;  Service: Cardiovascular;  Laterality: N/A;    . CARDIAC CATHETERIZATION N/A 10/02/2014   Procedure: Left Heart Cath and Coronary Angiography;  Surgeon: Belva Crome, MD;  Location: [place] CV LAB;  Service: Cardiovascular;  Laterality: N/A;  . CATARACT EXTRACTION    . CHOLECYSTECTOMY    . COLONOSCOPY    . CORONARY ARTERY BYPASS GRAFT N/A 11/03/2014   Procedure: CORONARY ARTERY BYPASS GRAFTING  times three using left internal mammary and right greater saphenous vein;  Surgeon: Gaye Pollack, MD;  Location: [facility] OR;  Service: Open Heart Surgery;  Laterality: N/A;  . HERNIA REPAIR    . LOWER EXTREMITY ANGIOGRAPHY Bilateral 12/28/2016   Procedure: Lower Extremity Angiography;  Surgeon: Waynetta Sandy, MD;  Location: [place] [facility];  Service: Cardiovascular;  Laterality: Bilateral;  . MELANOMA EXCISION    . PERIPHERAL VASCULAR BALLOON ANGIOPLASTY Right 12/28/2016   Procedure: PERIPHERAL VASCULAR BALLOON ANGIOPLASTY;  Surgeon: Waynetta Sandy, MD;  Location: [place] CV LAB;  Service: Cardiovascular;  Laterality: Right;  SFA UNSUCCESSFUL PTA  UNABLE TO CROSS LESION  . SKIN LESION EXCISION     multiple  . TEE WITHOUT CARDIOVERSION N/A 11/03/2014   Procedure: TRANSESOPHAGEAL ECHOCARDIOGRAM (TEE);  Surgeon: Gaye Pollack, MD;  Location: [place];  Service: Open Heart Surgery;  Laterality: N/A;    Allergies: Penicillins; Plavix [clopidogrel bisulfate]; and Pletal [cilostazol]  Medications: Prior to Admission medications   Medication Sig Start Date End  Date Taking? Authorizing Provider  carvedilol (COREG) 3.125 MG tablet TAKE 1 TABLET BY MOUTH TWICE A DAY 01/01/18  Yes Belva Crome, MD  lisinopril (PRINIVIL,ZESTRIL) 5 MG tablet Take 1 tablet (5 mg total) by mouth daily. 01/23/18  Yes Belva Crome, MD  acetaminophen (TYLENOL) 500 MG tablet Take 1,000 mg by mouth daily as needed for moderate pain.    [provider]  allopurinol (ZYLOPRIM) 100 MG tablet Take 1 tablet by mouth daily 12/11/17   [name], MD   ARTIFICIAL TEAR OP Apply 1 drop to eye daily as needed (dry eyes).    [provider]  aspirin EC 81 MG tablet Take 81 mg by mouth daily.    [provider]  atorvastatin (LIPITOR) 40 MG tablet take 1 tablet by mouth daily at 6:00pm  Patient taking differently: Take 40 mg by mouth daily.  12/25/17   [name], MD  Blood Glucose Monitoring Suppl (FREESTYLE LITE) DEVI USE TO TEST BLOOD SUGAR TWICE DAILY 08/08/17   [name], MD  calcium carbonate (TUMS - DOSED IN MG ELEMENTAL CALCIUM) 500 MG chewable tablet Chew 1 tablet by mouth daily as needed for indigestion or heartburn.    [provider]  Cholecalciferol (VITAMIN D) 2000 units CAPS Take 2,000 Units by mouth daily.    [provider]  Coenzyme Q10 (COQ10) 200 MG CAPS Take 200 mg by mouth daily.    [provider]  fenofibrate micronized (ANTARA) 43 MG capsule Take 43 mg by mouth every evening.    [provider]  ferrous sulfate 325 (65 FE) MG tablet Take 325 mg by mouth daily.    [provider]  finasteride (PROSCAR) 5 MG tablet Take 5 mg by mouth every evening.  09/24/15   [provider]  FLUAD 0.5 ML SUSY  03/20/17   [provider]  folic acid (FOLVITE) 875 MCG tablet Take 400 mcg by mouth daily.    [provider]  glipiZIDE (GLUCOTROL XL) 10 MG 24 hr tablet TAKE 1 TABLET BY MOUTH AT BEDTIME FOR DIABETES. Patient taking differently: Take 10 mg by mouth at bedtime.  02/08/18   [name], MD  glucose blood (FREESTYLE LITE) test strip Use as instructed to check blood sugar 2 times per day dx code E11.65 10/03/16   [name], MD  Homeopathic Products (LEG CRAMP RELIEF) TABS Take 1 tablet by mouth daily as needed (leg cramps).    [provider]  hydrocortisone cream 1 % Apply 1 application topically daily as needed (irritation).    [provider]  Lancets (FREESTYLE) lancets Use as instructed to check blood sugar 2 times per day dx  code E11.65 09/15/15   [name], MD  metFORMIN (GLUCOPHAGE) 1000 MG tablet TAKE 1 TABLET BY MOUTH TWICE DAILY FOR BREAKFAST AND DINNER FOR DIABETES. 02/08/18   [name], MD  miglitol (GLYSET) 50 MG tablet TAKE 1 TABLET BY MOUTH TWICE DAILY AT BREAKFAST AND DINNER FOR DIABETES. 02/08/18   [name], MD  multivitamin-lutein [facility]) CAPS capsule Take 1 capsule by mouth 2 (two) times daily.    [provider]  tamsulosin (FLOMAX) 0.4 MG CAPS capsule Take 0.4 mg by mouth 2 (two) times daily.    [provider]  VICTOZA 18 MG/3ML SOPN INJECT 1.8MG  INTO THE SKIN DAILY AT DINNERTIME FOR DIABETES. 02/08/18   [name], MD  vitamin B-12 (CYANOCOBALAMIN) 1000 MCG tablet Take 1,000 mcg by mouth daily.    [provider]     Family History  Problem Relation Age of Onset  . Colon cancer Father   . Heart disease Mother     Social History   Socioeconomic History  . Marital status: Married    Spouse name: Not on file  . Number of children: Not on file  . Years of education: Not on file  . Highest education level: Not on file  Occupational History  . Not on file  Social Needs  . Financial resource strain: Not on file  . Food insecurity:    Worry: Not on file    Inability: Not on file  . Transportation needs:    Medical: Not on file    Non-medical: Not on file  Tobacco Use  . Smoking status: Former Smoker    Packs/day: 0.50    Years: 50.00    Pack years: 25.00    Types: Cigarettes    Last attempt to quit: 10/22/2014    Years since quitting: 3.6  . Smokeless tobacco: Never Used  Substance and Sexual Activity  . Alcohol use: No    Alcohol/week: 0.0 standard drinks  . Drug use: No  . Sexual activity: Not on file  Lifestyle  . Physical activity:    Days per week: Not on file    Minutes per session: Not on file  . Stress: Not on file  Relationships  . Social connections:    Talks on phone: Not on file    Gets together: Not on file    Attends  religious service: Not on file    Active member of club or organization: Not on file    Attends meetings of clubs or organizations: Not on file    Relationship status: Not on file  Other Topics Concern  . Not on file  Social History Narrative  . Not on file     Review of Systems: A 12 point ROS discussed and pertinent positives are indicated in the HPI above.  All other systems are negative.  Review of Systems  Constitutional: Negative for activity change, fatigue and fever.  Respiratory: Negative for cough and shortness of breath.   Cardiovascular: Negative for chest pain.  Gastrointestinal: Negative for abdominal pain.  Musculoskeletal: Negative for back pain and gait problem.  Neurological: Negative for weakness.  Psychiatric/Behavioral: Negative for behavioral problems and confusion.    Vital Signs: BP (!) 152/66   Pulse 79   Temp (!) 97.4 F (36.3 C)   Resp 15   Ht 6\' 2"  (1.88 m)   Wt 165 lb (74.8 kg)   SpO2 100%   BMI 21.18 kg/m   Physical Exam Vitals signs reviewed.  Cardiovascular:     Rate and Rhythm: Normal rate and regular rhythm.     Heart sounds: Normal heart sounds.  Pulmonary:     Effort: Pulmonary effort is normal.     Breath sounds: Normal breath sounds.  Abdominal:     General: Bowel sounds are normal.  Musculoskeletal: Normal range of motion.     Comments: Right groin mass  Skin:    General: Skin is warm and dry.  Neurological:     General: No focal deficit present.     Mental Status: He is alert and oriented to person, place, and time.  Psychiatric:        Mood and Affect: Mood normal.        Behavior: Behavior normal.        Thought Content: Thought content normal.  Judgment: Judgment normal.     Imaging: No results found.  Labs:  CBC: Recent Labs    06/22/18 1110  WBC 7.2  HGB 9.8*  HCT 32.3*  PLT 179    COAGS: Recent Labs    06/22/18 1110  INR 1.02    BMP: Recent Labs    07/07/17 0844 10/03/17 0950  01/01/18 0950 04/09/18 1020  NA 138 140 138 141  K 4.4 4.6 4.8 4.5  CL 108 111 111 112  CO2 21 23 23 22   GLUCOSE 131* 145* 162* 121*  BUN 32* 25* 25* 29*  CALCIUM 8.8 9.1 9.0 9.0  CREATININE 1.30 1.34 1.32 1.18    LIVER FUNCTION TESTS: Recent Labs    10/03/17 0950 01/01/18 0950 04/09/18 1020  BILITOT 0.8 0.8 0.8  AST 27 25 24   ALT 37 35 29  ALKPHOS 62 56 50  PROT 6.3 6.0 6.1  ALBUMIN 3.9 3.9 4.0    TUMOR MARKERS: No results for input(s): AFPTM, CEA, CA199, CHROMGRNA in the last 8760 hours.  Assessment and Plan:  Right groin mass  Noted weeks ago No pain US reveals LAN/mass Scheduled for biopsy of same Risks and benefits discussed with the patient including, but not limited to bleeding, infection, damage to adjacent structures or low yield requiring additional tests.  All of the patient's questions were answered, patient is agreeable to proceed. Consent signed and in chart.   Thank you for this interesting consult.  I greatly enjoyed meeting Mario [name] and look forward to participating in their care.  A copy of this report was sent to the requesting provider on this date.  Electronically Signed: Lavonia Drafts, PA-C 06/22/2018, 12:08 PM   I spent a total of  30 Minutes   in face to face in clinical consultation, greater than 50% of which was counseling/coordinating care for right groin mass bx

## 2018-06-22 NOTE — Procedures (Signed)
R inguinal LN core Bx 18 g times four EBL 0 Comp 0

## 2018-06-22 NOTE — Discharge Instructions (Signed)
06/22/2018   Procedure-Image Guide Right Groin Mass Biopsy   The sedative medications given during your procedure may have effects lasting up to 24 hours.  You may have some drowsiness, dizziness, difficulty with walking, making decisions and memory problems.  No activities should be taken that require your best effort during the next 24 hours.  For your safety it is important that you understand and follow these instructions.  1. Do not drive any form of motorized equipment such as cars, trucks, yard tractors, golf carts, motorcycles and mopeds. 2. Do not drink alcohol. 3. Do not operate machinery or equipment such as stove, lawnmower and chainsaw. 4. Do not make any important decisions such as signing any legal documents. 5. You may become dizzy, especially when changing positions.  Move slowly and take your time.  Avoid stairs if possible. 6. Resume your normal diet when you feel ready to eat.  If you feel queasy, drink small amounts of uncarbonated clear liquids such as tea, orange or apple juice, clear broth or Gatorade to avoid getting dehydrated.  Then progress to bland soup, toast or crackers, then resume your normal diet again.      These instructions have been explained and understood by me or by a responsible person.  I have received a copy to take home with me.  I have a responsible adult to drive me home and stay with me when I return home.  All of my questions have been answered for me or for a responsible person. Mario [name]

## 2018-06-28 ENCOUNTER — Other Ambulatory Visit: Payer: Self-pay | Admitting: General Surgery

## 2018-06-28 ENCOUNTER — Other Ambulatory Visit (HOSPITAL_COMMUNITY): Payer: Self-pay | Admitting: General Surgery

## 2018-06-28 ENCOUNTER — Ambulatory Visit (HOSPITAL_COMMUNITY): Payer: PPO

## 2018-06-28 DIAGNOSIS — Z951 Presence of aortocoronary bypass graft: Secondary | ICD-10-CM | POA: Diagnosis not present

## 2018-06-28 DIAGNOSIS — C774 Secondary and unspecified malignant neoplasm of inguinal and lower limb lymph nodes: Secondary | ICD-10-CM

## 2018-06-28 DIAGNOSIS — I2510 Atherosclerotic heart disease of native coronary artery without angina pectoris: Secondary | ICD-10-CM | POA: Diagnosis not present

## 2018-06-28 DIAGNOSIS — R590 Localized enlarged lymph nodes: Secondary | ICD-10-CM | POA: Diagnosis not present

## 2018-06-28 DIAGNOSIS — C44701 Unspecified malignant neoplasm of skin of unspecified lower limb, including hip: Secondary | ICD-10-CM | POA: Diagnosis not present

## 2018-06-28 DIAGNOSIS — I129 Hypertensive chronic kidney disease with stage 1 through stage 4 chronic kidney disease, or unspecified chronic kidney disease: Secondary | ICD-10-CM | POA: Diagnosis not present

## 2018-06-28 DIAGNOSIS — E785 Hyperlipidemia, unspecified: Secondary | ICD-10-CM | POA: Diagnosis not present

## 2018-06-28 DIAGNOSIS — I739 Peripheral vascular disease, unspecified: Secondary | ICD-10-CM | POA: Diagnosis not present

## 2018-06-28 DIAGNOSIS — I10 Essential (primary) hypertension: Secondary | ICD-10-CM | POA: Diagnosis not present

## 2018-06-28 DIAGNOSIS — E1122 Type 2 diabetes mellitus with diabetic chronic kidney disease: Secondary | ICD-10-CM | POA: Diagnosis not present

## 2018-06-29 ENCOUNTER — Encounter: Payer: Self-pay | Admitting: Oncology

## 2018-06-29 ENCOUNTER — Ambulatory Visit (HOSPITAL_COMMUNITY)
Admission: RE | Admit: 2018-06-29 | Discharge: 2018-06-29 | Disposition: A | Discharge status: Home / Self Care | Payer: PPO | Source: Ambulatory Visit | Attending: General Surgery | Admitting: General Surgery

## 2018-06-29 ENCOUNTER — Telehealth: Payer: Self-pay | Admitting: Oncology

## 2018-06-29 DIAGNOSIS — C774 Secondary and unspecified malignant neoplasm of inguinal and lower limb lymph nodes: Secondary | ICD-10-CM | POA: Insufficient documentation

## 2018-06-29 DIAGNOSIS — C801 Malignant (primary) neoplasm, unspecified: Secondary | ICD-10-CM | POA: Diagnosis not present

## 2018-06-29 LAB — POCT I-STAT CREATININE: Creatinine, Ser: 1.3 mg/dL — ABNORMAL HIGH (ref 0.61–1.24)

## 2018-06-29 MED ORDER — SODIUM CHLORIDE (PF) 0.9 % IJ SOLN
INTRAMUSCULAR | Status: AC
  Filled 2018-06-29: qty 50

## 2018-06-29 MED ORDER — IOHEXOL 300 MG/ML  SOLN
100.0000 mL | Freq: Once | INTRAMUSCULAR | Status: AC | PRN
  Administered 2018-06-29: 100 mL via INTRAVENOUS

## 2018-06-29 NOTE — Telephone Encounter (Signed)
Received a new patient referral from Dr. 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 Pt has been scheduled to see Dr. [name] on 2/19 at 11am. I cld and lft the appt date and time on the pt's vm. Letter mailed. [place], [place]

## 2018-07-10 ENCOUNTER — Encounter (HOSPITAL_COMMUNITY)
Admission: RE | Admit: 2018-07-10 | Discharge: 2018-07-10 | Disposition: A | Discharge status: Home / Self Care | Payer: PPO | Source: Ambulatory Visit | Attending: General Surgery | Admitting: General Surgery

## 2018-07-10 DIAGNOSIS — C499 Malignant neoplasm of connective and soft tissue, unspecified: Secondary | ICD-10-CM | POA: Diagnosis not present

## 2018-07-10 DIAGNOSIS — C774 Secondary and unspecified malignant neoplasm of inguinal and lower limb lymph nodes: Secondary | ICD-10-CM | POA: Insufficient documentation

## 2018-07-10 LAB — GLUCOSE, CAPILLARY: Glucose-Capillary: 114 mg/dL — ABNORMAL HIGH (ref 70–99)

## 2018-07-10 MED ORDER — FLUDEOXYGLUCOSE F - 18 (FDG) INJECTION
8.3300 | Freq: Once | INTRAVENOUS | Status: AC | PRN
  Administered 2018-07-10: 8.33 via INTRAVENOUS

## 2018-07-11 ENCOUNTER — Inpatient Hospital Stay: Payer: PPO | Attending: Oncology | Admitting: Oncology

## 2018-07-11 ENCOUNTER — Telehealth: Payer: Self-pay | Admitting: Oncology

## 2018-07-11 ENCOUNTER — Ambulatory Visit: Payer: PPO | Admitting: Oncology

## 2018-07-11 VITALS — BP 149/68 | HR 84 | Temp 97.5°F | Resp 18 | Ht 74.0 in | Wt 166.8 lb

## 2018-07-11 DIAGNOSIS — C3412 Malignant neoplasm of upper lobe, left bronchus or lung: Secondary | ICD-10-CM

## 2018-07-11 DIAGNOSIS — C4490 Unspecified malignant neoplasm of skin, unspecified: Secondary | ICD-10-CM | POA: Diagnosis not present

## 2018-07-11 DIAGNOSIS — Z79899 Other long term (current) drug therapy: Secondary | ICD-10-CM | POA: Insufficient documentation

## 2018-07-11 DIAGNOSIS — R918 Other nonspecific abnormal finding of lung field: Secondary | ICD-10-CM

## 2018-07-11 NOTE — Telephone Encounter (Signed)
Gave avs and calendar ° °

## 2018-07-11 NOTE — Progress Notes (Signed)
Reason for the request:   Squamous cell carcinoma  HPI: I was asked by Dr. Dalbert Batman to evaluate Mario [name] for squamous cell carcinoma.  He is a 78 year old man native of [place] that currently lives in this area after relocating for the last 6 years.  He has history of coronary disease, gout and diabetes.  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  He did have a lesion with a shave biopsy completed in December 2019 which showed a squamous cell carcinoma performed by Dr. [name].  In the last few months, he started developing right inguinal mass and was evaluated by his primary care physician and subsequently referred to Dr. Dalbert Batman.  Percutaneous biopsy obtained on June 22, 2018 confirmed the presence of squamous cell carcinoma.  PET scan obtained on July 10, 2018 showed hypermetabolic activity associated with a 4.1 cm mass in the left upper lung that is worrisome for a long neoplasm.  Enlarging hypermetabolic right inguinal lymph node was also noted.  Based on these findings he was referred to me for evaluation.  Clinically, reports no other complaints at this time.  He denies any respiratory issues including cough or shortness of breath.  He remains reasonably active and exercises regularly.  He denies any new skin lesions or rashes.  Recent hospitalization or illnesses.  He denies any recent infections.   He does not report any headaches, blurry vision, syncope or seizures. Does not report any fevers, chills or sweats.  Does not report any cough, wheezing or hemoptysis.  Does not report any chest pain, palpitation, orthopnea or leg edema.  Does not report any nausea, vomiting or abdominal pain.  Does not report any constipation or diarrhea.  Does not report any skeletal complaints.    Does not report frequency, urgency or hematuria.  Does not report any skin rashes or lesions. Does not report any heat or cold intolerance.  Does not report any  lymphadenopathy or petechiae.  Does not report any anxiety or depression.  Remaining review of systems is negative.    Past Medical History:  Diagnosis Date  . Arthritis   . CAD (coronary artery disease)    a.  s/p IMI and BMS 1997;  b. Myoview 4/16:  inf scar with peri-infarct ischemia, EF 34%; high risk;  c. LHC 5/16:  3 v CAD >> CABG (free L-LAD, S-OM, S-dRCA)  . Carotid stenosis    a. carotid US 6/16:  bilat ICA 1-39%  . Chronic kidney disease, stage II (mild)   . Chronic systolic CHF (congestive heart failure) ([facility])   . Essential hypertension, benign   . Gout   . HLD (hyperlipidemia)   . Ischemic cardiomyopathy    a. EF by myoview 4/16 34%; b. LHC 5/16: EF 40-45%;  c. intraop TEE 6/16: EF 45-50%  . Orthostasis   . PAD (peripheral artery disease) ([facility])    Dr. Fletcher Anon  . Pneumonia    hx of  . Type II or unspecified type diabetes mellitus without mention of complication, uncontrolled   :  Past Surgical History:  Procedure Laterality Date  . ABDOMINAL AORTAGRAM N/A 10/09/2013   Procedure: ABDOMINAL Maxcine Ham;  Surgeon: Wellington Hampshire, MD;  Location: [facility];  Service: Cardiovascular;  Laterality: N/A;  . ABDOMINAL AORTOGRAM N/A 12/28/2016   Procedure: ABDOMINAL AORTOGRAM;  Surgeon: Waynetta Sandy, MD;  Location: [facility];  Service: Cardiovascular;  Laterality: N/A;  . CARDIAC CATHETERIZATION N/A 10/02/2014  Procedure: Left Heart Cath and Coronary Angiography;  Surgeon: Belva Crome, MD;  Location: [facility];  Service: Cardiovascular;  Laterality: N/A;  . CATARACT EXTRACTION    . CHOLECYSTECTOMY    . COLONOSCOPY    . CORONARY ARTERY BYPASS GRAFT N/A 11/03/2014   Procedure: CORONARY ARTERY BYPASS GRAFTING  times three using left internal mammary and right greater saphenous vein;  Surgeon: Gaye Pollack, MD;  Location: [facility];  Service: Open Heart Surgery;  Laterality: N/A;  . HERNIA REPAIR    . LOWER EXTREMITY ANGIOGRAPHY Bilateral 12/28/2016    Procedure: Lower Extremity Angiography;  Surgeon: Waynetta Sandy, MD;  Location: [facility];  Service: Cardiovascular;  Laterality: Bilateral;  . MELANOMA EXCISION    . PERIPHERAL VASCULAR BALLOON ANGIOPLASTY Right 12/28/2016   Procedure: PERIPHERAL VASCULAR BALLOON ANGIOPLASTY;  Surgeon: Waynetta Sandy, MD;  Location: [facility];  Service: Cardiovascular;  Laterality: Right;  SFA UNSUCCESSFUL PTA  UNABLE TO CROSS LESION  . SKIN LESION EXCISION     multiple  . TEE WITHOUT CARDIOVERSION N/A 11/03/2014   Procedure: TRANSESOPHAGEAL ECHOCARDIOGRAM (TEE);  Surgeon: Gaye Pollack, MD;  Location: [facility];  Service: Open Heart Surgery;  Laterality: N/A;  :   Current Outpatient Medications:  .  acetaminophen (TYLENOL) 500 MG tablet, Take 1,000 mg by mouth daily as needed for moderate pain., Disp: , Rfl:  .  allopurinol (ZYLOPRIM) 100 MG tablet, Take 1 tablet by mouth daily, Disp: 90 tablet, Rfl: 0 .  ARTIFICIAL TEAR OP, Apply 1 drop to eye daily as needed (dry eyes)., Disp: , Rfl:  .  aspirin EC 81 MG tablet, Take 81 mg by mouth daily., Disp: , Rfl:  .  atorvastatin (LIPITOR) 40 MG tablet, take 1 tablet by mouth daily at 6:00pm  (Patient taking differently: Take 40 mg by mouth daily. ), Disp: 90 tablet, Rfl: 0 .  Blood Glucose Monitoring Suppl (FREESTYLE LITE) DEVI, USE TO TEST BLOOD SUGAR TWICE DAILY, Disp: 1 each, Rfl: 1 .  calcium carbonate (TUMS - DOSED IN MG ELEMENTAL CALCIUM) 500 MG chewable tablet, Chew 1 tablet by mouth daily as needed for indigestion or heartburn., Disp: , Rfl:  .  carvedilol (COREG) 3.125 MG tablet, TAKE 1 TABLET BY MOUTH TWICE A DAY (Patient taking differently: Take 3.125 mg by mouth 2 (two) times daily with a meal. ), Disp: 180 tablet, Rfl: 3 .  Cholecalciferol (VITAMIN D) 2000 units CAPS, Take 2,000 Units by mouth daily., Disp: , Rfl:  .  Coenzyme Q10 (COQ10) 200 MG CAPS, Take 200 mg by mouth daily., Disp: , Rfl:  .  fenofibrate micronized  (ANTARA) 43 MG capsule, Take 43 mg by mouth every evening., Disp: , Rfl:  .  ferrous sulfate 325 (65 FE) MG tablet, Take 325 mg by mouth daily., Disp: , Rfl:  .  finasteride (PROSCAR) 5 MG tablet, Take 5 mg by mouth every evening. , Disp: , Rfl: 3 .  folic acid (FOLVITE) 449 MCG tablet, Take 400 mcg by mouth daily., Disp: , Rfl:  .  glipiZIDE (GLUCOTROL XL) 10 MG 24 hr tablet, TAKE 1 TABLET BY MOUTH AT BEDTIME FOR DIABETES. (Patient taking differently: Take 10 mg by mouth at bedtime. ), Disp: 30 tablet, Rfl: 2 .  glucose blood (FREESTYLE LITE) test strip, Use as instructed to check blood sugar 2 times per day dx code E11.65, Disp: 100 each, Rfl: 3 .  Homeopathic Products (LEG CRAMP RELIEF) TABS, Take 1  tablet by mouth daily as needed (leg cramps)., Disp: , Rfl:  .  hydrocortisone cream 1 %, Apply 1 application topically daily as needed (irritation)., Disp: , Rfl:  .  Lancets (FREESTYLE) lancets, Use as instructed to check blood sugar 2 times per day dx code E11.65, Disp: 100 each, Rfl: 3 .  lisinopril (PRINIVIL,ZESTRIL) 5 MG tablet, Take 1 tablet (5 mg total) by mouth daily., Disp: 90 tablet, Rfl: 3 .  metFORMIN (GLUCOPHAGE) 1000 MG tablet, TAKE 1 TABLET BY MOUTH TWICE DAILY FOR BREAKFAST AND DINNER FOR DIABETES. (Patient taking differently: Take 1,000 mg by mouth 2 (two) times daily with a meal. ), Disp: 60 tablet, Rfl: 2 .  miglitol (GLYSET) 50 MG tablet, TAKE 1 TABLET BY MOUTH TWICE DAILY AT BREAKFAST AND DINNER FOR DIABETES. (Patient taking differently: Take 50 mg by mouth 2 (two) times daily. ), Disp: 60 tablet, Rfl: 2 .  multivitamin-lutein (OCUVITE-LUTEIN) CAPS capsule, Take 1 capsule by mouth 2 (two) times daily., Disp: , Rfl:  .  tamsulosin (FLOMAX) 0.4 MG CAPS capsule, Take 0.4 mg by mouth 2 (two) times daily., Disp: , Rfl:  .  VICTOZA 18 MG/3ML SOPN, INJECT 1.8MG  INTO THE SKIN DAILY AT DINNERTIME FOR DIABETES. (Patient taking differently: Inject 1.8 mg into the skin daily with supper. ),  Disp: 9 mL, Rfl: 2 .  vitamin B-12 (CYANOCOBALAMIN) 1000 MCG tablet, Take 1,000 mcg by mouth daily., Disp: , Rfl: :  Allergies  Allergen Reactions  . Penicillins Anaphylaxis    Has patient had a PCN reaction causing immediate rash, facial/tongue/throat swelling, SOB or lightheadedness with hypotension: Yes Has patient had a PCN reaction causing severe rash involving mucus membranes or skin necrosis: No Has patient had a PCN reaction that required hospitalization: Yes Has patient had a PCN reaction occurring within the last 10 years: Unknown If all of the above answers are "NO", then may proceed with Cephalosporin use.   Marland Kitchen Plavix [Clopidogrel Bisulfate] Hives  . Pletal [Cilostazol] Itching  :  Family History  Problem Relation Age of Onset  . Colon cancer Father   . Heart disease Mother   :  Social History   Socioeconomic History  . Marital status: Married    Spouse name: Not on file  . Number of children: Not on file  . Years of education: Not on file  . Highest education level: Not on file  Occupational History  . Not on file  Social Needs  . Financial resource strain: Not on file  . Food insecurity:    Worry: Not on file    Inability: Not on file  . Transportation needs:    Medical: Not on file    Non-medical: Not on file  Tobacco Use  . Smoking status: Former Smoker    Packs/day: 0.50    Years: 50.00    Pack years: 25.00    Types: Cigarettes    Last attempt to quit: 10/22/2014    Years since quitting: 3.7  . Smokeless tobacco: Never Used  Substance and Sexual Activity  . Alcohol use: No    Alcohol/week: 0.0 standard drinks  . Drug use: No  . Sexual activity: Not on file  Lifestyle  . Physical activity:    Days per week: Not on file    Minutes per session: Not on file  . Stress: Not on file  Relationships  . Social connections:    Talks on phone: Not on file    Gets together: Not on file  Attends religious service: Not on file    Active member of club  or organization: Not on file    Attends meetings of clubs or organizations: Not on file    Relationship status: Not on file  . Intimate partner violence:    Fear of current or ex partner: Not on file    Emotionally abused: Not on file    Physically abused: Not on file    Forced sexual activity: Not on file  Other Topics Concern  . Not on file  Social History Narrative  . Not on file  :  Pertinent items are noted in HPI.  Exam: Blood pressure (!) 149/68, pulse 84, temperature (!) 97.5 F (36.4 C), temperature source Oral, resp. rate 18, height 6\' 2"  (1.88 m), weight 166 lb 12.8 oz (75.7 kg), SpO2 99 %.  ECOG 1 General appearance: alert and cooperative appeared without distress. Head: atraumatic without any abnormalities. Eyes: conjunctivae/corneas clear. PERRL.  Sclera anicteric. Throat: lips, mucosa, and tongue normal; without oral thrush or ulcers. Resp: clear to auscultation bilaterally without rhonchi, wheezes or dullness to percussion. Cardio: regular rate and rhythm, S1, S2 normal, no murmur, click, rub or gallop GI: soft, non-tender; bowel sounds normal; no masses,  no organomegaly Skin: Skin color, texture, turgor normal. No rashes or lesions Lymph nodes: Palpable right inguinal lymph node.  No cervical, supraclavicular, or axillary lymphadenopathy. Neurologic: Grossly normal without any motor, sensory or deep tendon reflexes. Musculoskeletal: No joint deformity or effusion.  CBC    Component Value Date/Time   WBC 7.2 06/22/2018 1110   RBC 3.45 (L) 06/22/2018 1110   HGB 9.8 (L) 06/22/2018 1110   HCT 32.3 (L) 06/22/2018 1110   PLT 179 06/22/2018 1110   MCV 93.6 06/22/2018 1110   MCH 28.4 06/22/2018 1110   MCHC 30.3 06/22/2018 1110   RDW 13.2 06/22/2018 1110   LYMPHSABS 1.6 09/23/2014 1209   MONOABS 0.4 09/23/2014 1209   EOSABS 0.2 09/23/2014 1209   BASOSABS 0.0 09/23/2014 1209     Chemistry      Component Value Date/Time   NA 141 04/09/2018 1020   K 4.5  04/09/2018 1020   CL 112 04/09/2018 1020   CO2 22 04/09/2018 1020   BUN 29 (H) 04/09/2018 1020   CREATININE 1.30 (H) 06/29/2018 1125   CREATININE 1.14 08/13/2013 1043      Component Value Date/Time   CALCIUM 9.0 04/09/2018 1020   ALKPHOS 50 04/09/2018 1020   AST 24 04/09/2018 1020   ALT 29 04/09/2018 1020   BILITOT 0.8 04/09/2018 1020       Ct Chest W Contrast  Result Date: 06/29/2018 CLINICAL DATA:  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 Diarrhea. EXAM: CT CHEST, ABDOMEN, AND PELVIS WITH CONTRAST TECHNIQUE: Multidetector CT imaging of the chest, abdomen and pelvis was performed following the standard protocol during bolus administration of intravenous contrast. CONTRAST:  174mL OMNIPAQUE IOHEXOL 300 MG/ML  SOLN COMPARISON:  CT chest 10/16/2014 FINDINGS: CT CHEST FINDINGS Cardiovascular: Coronary, aortic arch, and branch vessel atherosclerotic vascular disease. Prior CABG. Dense calcification of the mitral valve. Heavy atherosclerotic calcification of the left common carotid artery. Stable left thyroid nodule. Ascending aortic aneurysm, 4.0 cm in diameter. Mediastinum/Nodes: Lower right paratracheal lymph node 1.2 cm in short axis on image 27/2, formerly 0.7 cm. Smaller upper paratracheal lymph nodes are present. Left hilar node 1.0 cm in short axis, image 31/2, stable. Lungs/Pleura: Centrilobular and paraseptal emphysema. Scattered scarring in the lungs. Confluent 3.5  by 4.1 by 9.6 cm region of airspace opacity anteriorly in the left upper lobe, with associated accentuated interstitial thickening. Musculoskeletal: Unremarkable CT ABDOMEN PELVIS FINDINGS Hepatobiliary: Hypodensity along the dome of the liver is attributed to the diaphragm based on multiplanar reformatted images. Cholecystectomy. Mild intrahepatic biliary dilatation, common hepatic duct 1.4 cm, common bile duct 0.7 cm. Pancreas: Dilated dorsal pancreatic duct in the pancreatic body and head, a similar  appearance was shown on 10/16/2014. Spleen: Unremarkable Adrenals/Urinary Tract: The kidneys and adrenal glands appear normal. Bilateral perirenal stranding. Urinary bladder unremarkable. Stomach/Bowel: Periampullary duodenal diverticulum. Descending and sigmoid colon diverticulosis. Vascular/Lymphatic: Aortoiliac atherosclerotic vascular disease. A pathologically enlarged right inguinal lymph node measures 2.1 cm in short axis. A left inguinal lymph node at 10.9 cm in short axis is not pathologically enlarged. No additional appreciable pathologic adenopathy. Reproductive: Linear calcifications in the prostate gland. Other: Trace edema along the paracolic gutters. Musculoskeletal: Left lower quadrant hernia mesh. Grade 1 degenerative anterolisthesis at L4-5 likely with impingement from disc uncovering and ligamentum flavum redundancy as well as the subluxation. IMPRESSION: 1. Pathologically enlarged 2.1 cm right inguinal lymph node is observed. No other adenopathy identified. 2. 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 Recommend annual imaging followup by CTA or MRA. This recommendation follows 2010 ACCF/AHA/AATS/ACR/ASA/SCA/SCAI/SIR/STS/SVM Guidelines for the Diagnosis and Management of Patients with Thoracic Aortic Disease. Circulation. 2010; 121: L798-X211. Aortic aneurysm NOS (ICD10-I71.9) 3. Airspace opacity anteriorly in the left upper lobe could be from pneumonia but is nonspecific. This is new from 10/16/2014. Followup PA and lateral chest X-ray is recommended in 3-4 weeks following trial of antibiotic therapy to ensure resolution and exclude underlying malignancy. Alternatively, if there are no symptoms supporting pneumonia, surveillance chest CT or nuclear medicine PET-CT might be considered. 4. Other imaging findings of potential clinical significance: Aortic Atherosclerosis (ICD10-I70.0). Coronary atherosclerosis. Dense mitral calcification. Heavy atherosclerotic calcification of the left  common carotid artery (consider carotid duplex). Emphysema (ICD10-J43.9). Periampullary duodenal diverticulum. Probable impingement at L4-5 due to spondylosis and degenerative disc disease. Electronically Signed   By: Van Clines M.D.   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 Diarrhea. EXAM: CT CHEST, ABDOMEN, AND PELVIS WITH CONTRAST TECHNIQUE: Multidetector CT imaging of the chest, abdomen and pelvis was performed following the standard protocol during bolus administration of intravenous contrast. CONTRAST:  143mL OMNIPAQUE IOHEXOL 300 MG/ML  SOLN COMPARISON:  CT chest 10/16/2014 FINDINGS: CT CHEST FINDINGS Cardiovascular: Coronary, aortic arch, and branch vessel atherosclerotic vascular disease. Prior CABG. Dense calcification of the mitral valve. Heavy atherosclerotic calcification of the left common carotid artery. Stable left thyroid nodule. Ascending aortic aneurysm, 4.0 cm in diameter. Mediastinum/Nodes: Lower right paratracheal lymph node 1.2 cm in short axis on image 27/2, formerly 0.7 cm. Smaller upper paratracheal lymph nodes are present. Left hilar node 1.0 cm in short axis, image 31/2, stable. Lungs/Pleura: Centrilobular and paraseptal emphysema. Scattered scarring in the lungs. Confluent 3.5 by 4.1 by 9.6 cm region of airspace opacity anteriorly in the left upper lobe, with associated accentuated interstitial thickening. Musculoskeletal: Unremarkable CT ABDOMEN PELVIS FINDINGS Hepatobiliary: Hypodensity along the dome of the liver is attributed to the diaphragm based on multiplanar reformatted images. Cholecystectomy. Mild intrahepatic biliary dilatation, common hepatic duct 1.4 cm, common bile duct 0.7 cm. Pancreas: Dilated dorsal pancreatic duct in the pancreatic body and head, a similar appearance was shown on 10/16/2014. Spleen: Unremarkable Adrenals/Urinary Tract:  The  kidneys and adrenal glands appear normal. Bilateral perirenal stranding. Urinary bladder unremarkable. Stomach/Bowel: Periampullary duodenal diverticulum. Descending and sigmoid colon diverticulosis. Vascular/Lymphatic: Aortoiliac atherosclerotic vascular disease. A pathologically enlarged right inguinal lymph node measures 2.1 cm in short axis. A left inguinal lymph node at 10.9 cm in short axis is not pathologically enlarged. No additional appreciable pathologic adenopathy. Reproductive: Linear calcifications in the prostate gland. Other: Trace edema along the paracolic gutters. Musculoskeletal: Left lower quadrant hernia mesh. Grade 1 degenerative anterolisthesis at L4-5 likely with impingement from disc uncovering and ligamentum flavum redundancy as well as the subluxation. IMPRESSION: 1. Pathologically enlarged 2.1 cm right inguinal lymph node is observed. No other adenopathy identified. 2. 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 Recommend annual imaging followup by CTA or MRA. This recommendation follows 2010 ACCF/AHA/AATS/ACR/ASA/SCA/SCAI/SIR/STS/SVM Guidelines for the Diagnosis and Management of Patients with Thoracic Aortic Disease. Circulation. 2010; 121: Z610-R604. Aortic aneurysm NOS (ICD10-I71.9) 3. Airspace opacity anteriorly in the left upper lobe could be from pneumonia but is nonspecific. This is new from 10/16/2014. Followup PA and lateral chest X-ray is recommended in 3-4 weeks following trial of antibiotic therapy to ensure resolution and exclude underlying malignancy. Alternatively, if there are no symptoms supporting pneumonia, surveillance chest CT or nuclear medicine PET-CT might be considered. 4. Other imaging findings of potential clinical significance: Aortic Atherosclerosis (ICD10-I70.0). Coronary atherosclerosis. Dense mitral calcification. Heavy atherosclerotic calcification of the left common carotid artery (consider carotid duplex). Emphysema (ICD10-J43.9).  Periampullary duodenal diverticulum. Probable impingement at L4-5 due to spondylosis and degenerative disc disease. Electronically Signed   By: Van Clines M.D.   On: 06/29/2018 12:23   Nm Pet Image Initial (pi) Skull Base To Thigh  Result Date: 07/10/2018 CLINICAL DATA:  Initial treatment strategy for left upper lobe focal consolidation on chest CT with metastatic squamous cell carcinoma of right inguinal lymph node. EXAM: NUCLEAR MEDICINE PET SKULL BASE TO THIGH TECHNIQUE: 8.3 mCi F-18 FDG was injected intravenously. Full-ring PET imaging was performed from the skull base to thigh after the radiotracer. CT data was obtained and used for attenuation correction and anatomic localization. Fasting blood glucose: 114 mg/dl COMPARISON:  06/29/2018 CT chest, abdomen and pelvis. FINDINGS: Mediastinal blood pool activity: SUV max 2.2 NECK: No hypermetabolic lymph nodes in the neck. Incidental CT findings: none CHEST: Hypermetabolic irregular masslike focus of consolidation in the anterior left upper lobe measuring 4.1 x 3.7 cm with spiculated contour and max SUV 9.2 (series 8/image 41), not appreciably changed since the recent 06/29/2018 chest CT, new since 10/16/2014 chest CT. No additional hypermetabolic pulmonary findings. No hypermetabolic hilar, mediastinal or axillary lymph nodes. Incidental CT findings: Severe centrilobular emphysema with diffuse bronchial wall thickening. Bullous emphysema in the anterior right upper lobe. A few scattered solid pulmonary nodules in both lungs, largest 4 mm in the right middle lobe (series 8/image 54), below PET resolution, all stable since 2016 CT, considered benign. No additional new significant pulmonary nodules. Three-vessel coronary atherosclerosis status post CABG. Atherosclerotic thoracic aorta with ectatic 4.2 cm ascending thoracic aorta, stable. Dilated main pulmonary artery (3.5 cm diameter). Intact sternotomy wires. ABDOMEN/PELVIS: Enlarged hypermetabolic 2.4  cm right inguinal lymph node with max SUV 11.5 (series 4/image 203). No additional hypermetabolic or enlarged abdominopelvic nodes. No abnormal hypermetabolic activity within the liver, pancreas, adrenal glands, or spleen. Incidental CT findings: Cholecystectomy. Marked diffuse colonic diverticulosis. Atherosclerotic nonaneurysmal abdominal aorta. Top-normal size prostate. SKELETON: No focal hypermetabolic activity to suggest skeletal metastasis. Incidental CT findings: none IMPRESSION: 1. Hypermetabolism (  max SUV 9.2) associated with the irregular 4.1 cm masslike focus of consolidation in the anterior left upper lung lobe, which has not changed since the recent 06/29/2018 chest CT, making pneumonia less likely. Findings are worrisome for primary bronchogenic carcinoma. 2. No hypermetabolic thoracic adenopathy. 3. Enlarged hypermetabolic right inguinal lymph node compatible with known metastatic disease. 4. No additional sites of hypermetabolic distant metastatic disease. 5. Chronic findings include: Aortic Atherosclerosis (ICD10-I70.0) and Emphysema (ICD10-J43.9). Dilated main pulmonary artery, suggesting pulmonary arterial hypertension. Ectatic 4.2 cm ascending thoracic aorta. Colonic diverticulosis. Electronically Signed   By: Ilona Sorrel M.D.   On: 07/10/2018 11:02     Assessment and Plan:    78 year old man with the following:  1.  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  He has also a staging PET CT scan that shows a lung mass.  The natural course of this disease on the differential diagnosis was discussed.  Squamous cell carcinoma arising from the skin with metastatic disease to the inguinal lymph node as well as lung metastasis is a possibility.  He separate the lung primary that is unrelated to his skin cancer could also be a possibility.  Primary lung neoplasm with metastatic disease to the inguinal lymph  node is considered less likely at this time.  From a management standpoint, I have recommended obtaining tissue biopsy from his lung mass to rule out a primary lung neoplasm.  His case will also be discussed subsequently in tumor board for multidisciplinary opinion.  Depending on the finding from his lung mass, he would likely require systemic therapy at this time for metastatic squamous cell carcinoma of the skin.    The logistics of administration for immunotherapy in the form of Libtayo was reviewed today as well as potential complications associated with immunotherapy.  Surgical resection would be a possibility if we are dealing with a separate lung neoplasm but I doubt he would be a good surgical candidate for both lung and inguinal lymph node surgery.  In all likelihood he will require systemic therapy with potentially inguinal lymph node resection if we are dealing with a separate lung primary.  All his questions were answered today to his satisfaction.  2.  Follow-up: He will follow-up after his lung biopsy as well as the tumor board multidisciplinary discussion.    60  minutes was spent with the patient face-to-face today.  More than 50% of time was dedicated to reviewing imaging studies, differential diagnosis, management options and answering questions regarding future plan of care..   Thank you for the referral.  A copy of this consult has been forwarded to the requesting physician.

## 2018-07-13 ENCOUNTER — Encounter (HOSPITAL_BASED_OUTPATIENT_CLINIC_OR_DEPARTMENT_OTHER): Payer: Self-pay

## 2018-07-13 ENCOUNTER — Encounter (HOSPITAL_BASED_OUTPATIENT_CLINIC_OR_DEPARTMENT_OTHER): Payer: PPO | Attending: Internal Medicine

## 2018-07-13 DIAGNOSIS — E11621 Type 2 diabetes mellitus with foot ulcer: Secondary | ICD-10-CM | POA: Insufficient documentation

## 2018-07-13 DIAGNOSIS — Z951 Presence of aortocoronary bypass graft: Secondary | ICD-10-CM | POA: Diagnosis not present

## 2018-07-13 DIAGNOSIS — C44722 Squamous cell carcinoma of skin of right lower limb, including hip: Secondary | ICD-10-CM | POA: Insufficient documentation

## 2018-07-13 DIAGNOSIS — E1151 Type 2 diabetes mellitus with diabetic peripheral angiopathy without gangrene: Secondary | ICD-10-CM | POA: Diagnosis not present

## 2018-07-13 DIAGNOSIS — E1140 Type 2 diabetes mellitus with diabetic neuropathy, unspecified: Secondary | ICD-10-CM | POA: Insufficient documentation

## 2018-07-13 DIAGNOSIS — L97521 Non-pressure chronic ulcer of other part of left foot limited to breakdown of skin: Secondary | ICD-10-CM | POA: Diagnosis not present

## 2018-07-13 DIAGNOSIS — C7802 Secondary malignant neoplasm of left lung: Secondary | ICD-10-CM | POA: Insufficient documentation

## 2018-07-13 DIAGNOSIS — I10 Essential (primary) hypertension: Secondary | ICD-10-CM | POA: Insufficient documentation

## 2018-07-13 DIAGNOSIS — I2510 Atherosclerotic heart disease of native coronary artery without angina pectoris: Secondary | ICD-10-CM | POA: Diagnosis not present

## 2018-07-17 DIAGNOSIS — E11319 Type 2 diabetes mellitus with unspecified diabetic retinopathy without macular edema: Secondary | ICD-10-CM | POA: Diagnosis not present

## 2018-07-17 DIAGNOSIS — I10 Essential (primary) hypertension: Secondary | ICD-10-CM | POA: Diagnosis not present

## 2018-07-17 DIAGNOSIS — I2510 Atherosclerotic heart disease of native coronary artery without angina pectoris: Secondary | ICD-10-CM | POA: Diagnosis not present

## 2018-07-17 DIAGNOSIS — E782 Mixed hyperlipidemia: Secondary | ICD-10-CM | POA: Diagnosis not present

## 2018-07-17 DIAGNOSIS — E7841 Elevated Lipoprotein(a): Secondary | ICD-10-CM | POA: Diagnosis not present

## 2018-07-17 DIAGNOSIS — E1151 Type 2 diabetes mellitus with diabetic peripheral angiopathy without gangrene: Secondary | ICD-10-CM | POA: Diagnosis not present

## 2018-07-17 DIAGNOSIS — N183 Chronic kidney disease, stage 3 (moderate): Secondary | ICD-10-CM | POA: Diagnosis not present

## 2018-07-17 DIAGNOSIS — N401 Enlarged prostate with lower urinary tract symptoms: Secondary | ICD-10-CM | POA: Diagnosis not present

## 2018-07-17 DIAGNOSIS — E1122 Type 2 diabetes mellitus with diabetic chronic kidney disease: Secondary | ICD-10-CM | POA: Diagnosis not present

## 2018-07-19 DIAGNOSIS — L97522 Non-pressure chronic ulcer of other part of left foot with fat layer exposed: Secondary | ICD-10-CM | POA: Diagnosis not present

## 2018-07-19 DIAGNOSIS — E11621 Type 2 diabetes mellitus with foot ulcer: Secondary | ICD-10-CM | POA: Diagnosis not present

## 2018-07-23 ENCOUNTER — Inpatient Hospital Stay: Payer: PPO | Attending: Oncology | Admitting: Oncology

## 2018-07-23 ENCOUNTER — Telehealth: Payer: Self-pay | Admitting: Oncology

## 2018-07-23 VITALS — BP 134/67 | HR 75 | Temp 97.9°F | Resp 18 | Ht 74.0 in | Wt 167.6 lb

## 2018-07-23 DIAGNOSIS — Z7984 Long term (current) use of oral hypoglycemic drugs: Secondary | ICD-10-CM | POA: Insufficient documentation

## 2018-07-23 DIAGNOSIS — R918 Other nonspecific abnormal finding of lung field: Secondary | ICD-10-CM | POA: Insufficient documentation

## 2018-07-23 DIAGNOSIS — C4492 Squamous cell carcinoma of skin, unspecified: Secondary | ICD-10-CM | POA: Diagnosis not present

## 2018-07-23 DIAGNOSIS — Z7982 Long term (current) use of aspirin: Secondary | ICD-10-CM | POA: Diagnosis not present

## 2018-07-23 DIAGNOSIS — C7989 Secondary malignant neoplasm of other specified sites: Secondary | ICD-10-CM | POA: Diagnosis not present

## 2018-07-23 DIAGNOSIS — Z79899 Other long term (current) drug therapy: Secondary | ICD-10-CM

## 2018-07-23 NOTE — Telephone Encounter (Signed)
Gave avs and calendar ° °

## 2018-07-23 NOTE — Progress Notes (Signed)
Hematology and Oncology Follow Up Visit  Mario [name] [id number] 11-Nov-1940 78 y.o. 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  He had localized disease at this time and subsequently presented with inguinal metastasis.  He has also a lung mass.   Prior Therapy: Status post surgical excision of a right knee skin lesion.  Pathology showed squamous cell carcinoma in December 2019.  Current therapy: Under evaluation for possible therapy.  Interim History: Mr. Vanbergen returns today for a follow-up visit.  Since her last visit, he reports no major changes in his health.  He does have a new wound on his foot after he was trying to clip his nails and had a nonhealing ulcer on his toe.  He is ambulating without any major difficulties.  He denies any recent falls or syncope.  He denies any respiratory complaints including cough or shortness of breath.  He denies any wheezing.  Performance status and quality of life not dramatically different.  He does not report any headaches, blurry vision, syncope or seizures. Does not report any fevers, chills or sweats.  Does not report any cough, wheezing or hemoptysis.  Does not report any chest pain, palpitation, orthopnea or leg edema.  Does not report any nausea, vomiting or abdominal pain.  Does not report any constipation or diarrhea.  Does not report any skeletal complaints.    Does not report frequency, urgency or hematuria.  Does not report any skin rashes or lesions. Does not report any heat or cold intolerance.  Does not report any lymphadenopathy or petechiae.  Does not report any anxiety or depression.  Remaining review of systems is negative.    Medications: I have reviewed the patient's current medications.  Current Outpatient Medications  Medication Sig Dispense Refill  . acetaminophen (TYLENOL) 500 MG tablet Take 1,000 mg  by mouth daily as needed for moderate pain.    Marland Kitchen allopurinol (ZYLOPRIM) 100 MG tablet Take 1 tablet by mouth daily 90 tablet 0  . ARTIFICIAL TEAR OP Apply 1 drop to eye daily as needed (dry eyes).    Marland Kitchen aspirin EC 81 MG tablet Take 81 mg by mouth daily.    Marland Kitchen atorvastatin (LIPITOR) 40 MG tablet take 1 tablet by mouth daily at 6:00pm  (Patient taking differently: Take 40 mg by mouth daily. ) 90 tablet 0  . Blood Glucose Monitoring Suppl (FREESTYLE LITE) DEVI USE TO TEST BLOOD SUGAR TWICE DAILY 1 each 1  . calcium carbonate (TUMS - DOSED IN MG ELEMENTAL CALCIUM) 500 MG chewable tablet Chew 1 tablet by mouth daily as needed for indigestion or heartburn.    . carvedilol (COREG) 3.125 MG tablet TAKE 1 TABLET BY MOUTH TWICE A DAY (Patient taking differently: Take 3.125 mg by mouth 2 (two) times daily with a meal. ) 180 tablet 3  . Cholecalciferol (VITAMIN D) 2000 units CAPS Take 2,000 Units by mouth daily.    . Coenzyme Q10 (COQ10) 200 MG CAPS Take 200 mg by mouth daily.    . fenofibrate micronized (ANTARA) 43 MG capsule Take 43 mg by mouth every evening.    . ferrous sulfate 325 (65 FE) MG tablet Take 325 mg by mouth daily.    . finasteride (PROSCAR) 5 MG tablet Take 5 mg by mouth every evening.   3  . folic acid (FOLVITE) 409 MCG tablet Take 400 mcg by mouth daily.    Marland Kitchen  glipiZIDE (GLUCOTROL XL) 10 MG 24 hr tablet TAKE 1 TABLET BY MOUTH AT BEDTIME FOR DIABETES. (Patient taking differently: Take 10 mg by mouth at bedtime. ) 30 tablet 2  . glucose blood (FREESTYLE LITE) test strip Use as instructed to check blood sugar 2 times per day dx code E11.65 100 each 3  . Homeopathic Products (LEG CRAMP RELIEF) TABS Take 1 tablet by mouth daily as needed (leg cramps).    . hydrocortisone cream 1 % Apply 1 application topically daily as needed (irritation).    . Lancets (FREESTYLE) lancets Use as instructed to check blood sugar 2 times per day dx code E11.65 100 each 3  . lisinopril (PRINIVIL,ZESTRIL) 5 MG tablet  Take 1 tablet (5 mg total) by mouth daily. 90 tablet 3  . metFORMIN (GLUCOPHAGE) 1000 MG tablet TAKE 1 TABLET BY MOUTH TWICE DAILY FOR BREAKFAST AND DINNER FOR DIABETES. (Patient taking differently: Take 1,000 mg by mouth 2 (two) times daily with a meal. ) 60 tablet 2  . miglitol (GLYSET) 50 MG tablet TAKE 1 TABLET BY MOUTH TWICE DAILY AT BREAKFAST AND DINNER FOR DIABETES. (Patient taking differently: Take 50 mg by mouth 2 (two) times daily. ) 60 tablet 2  . multivitamin-lutein (OCUVITE-LUTEIN) CAPS capsule Take 1 capsule by mouth 2 (two) times daily.    . tamsulosin (FLOMAX) 0.4 MG CAPS capsule Take 0.4 mg by mouth 2 (two) times daily.    Marland Kitchen VICTOZA 18 MG/3ML SOPN INJECT 1.8MG  INTO THE SKIN DAILY AT DINNERTIME FOR DIABETES. (Patient taking differently: Inject 1.8 mg into the skin daily with supper. ) 9 mL 2  . vitamin B-12 (CYANOCOBALAMIN) 1000 MCG tablet Take 1,000 mcg by mouth daily.     No current facility-administered medications for this visit.      Allergies:  Allergies  Allergen Reactions  . Penicillins Anaphylaxis    Has patient had a PCN reaction causing immediate rash, facial/tongue/throat swelling, SOB or lightheadedness with hypotension: Yes Has patient had a PCN reaction causing severe rash involving mucus membranes or skin necrosis: No Has patient had a PCN reaction that required hospitalization: Yes Has patient had a PCN reaction occurring within the last 10 years: Unknown If all of the above answers are "NO", then may proceed with Cephalosporin use.   Marland Kitchen Plavix [Clopidogrel Bisulfate] Hives  . Pletal [Cilostazol] Itching    Past Medical History, Surgical history, Social history, and Family History were reviewed and updated.    Physical Exam: Blood pressure 134/67, pulse 75, temperature 97.9 F (36.6 C), temperature source Oral, resp. rate 18, height 6\' 2"  (1.88 m), weight 167 lb 9.6 oz (76 kg), SpO2 100 %.   ECOG: 1   General appearance: Comfortable appearing  without any discomfort Head: Normocephalic without any trauma Oropharynx: Mucous membranes are moist and pink without any thrush or ulcers. Eyes: Pupils are equal and round reactive to light. Lymph nodes: No cervical, supraclavicular, inguinal or axillary lymphadenopathy.   Heart:regular rate and rhythm.  S1 and S2 without leg edema. Lung: Clear without any rhonchi or wheezes.  No dullness to percussion. Abdomin: Soft, nontender, nondistended with good bowel sounds.  No hepatosplenomegaly. Musculoskeletal: No joint deformity or effusion.  Full range of motion noted. Neurological: No deficits noted on motor, sensory and deep tendon reflex exam. Skin: No petechial rash or dryness.  Appeared moist.     Lab Results: Lab Results  Component Value Date   WBC 7.2 06/22/2018   HGB 9.8 (L) 06/22/2018   HCT 32.3 (  L) 06/22/2018   MCV 93.6 06/22/2018   PLT 179 06/22/2018     Chemistry      Component Value Date/Time   NA 141 04/09/2018 1020   K 4.5 04/09/2018 1020   CL 112 04/09/2018 1020   CO2 22 04/09/2018 1020   BUN 29 (H) 04/09/2018 1020   CREATININE 1.30 (H) 06/29/2018 1125   CREATININE 1.14 08/13/2013 1043      Component Value Date/Time   CALCIUM 9.0 04/09/2018 1020   ALKPHOS 50 04/09/2018 1020   AST 24 04/09/2018 1020   ALT 29 04/09/2018 1020   BILITOT 0.8 04/09/2018 1020        Impression and Plan:  78 year old man with the following:  1.  Squamous cell carcinoma of the skin diagnosed in December 2019.  He presented with a lesion on his right knee at the time.  He is status post surgical resection and subsequently developed right inguinal mass that is biopsy-proven to have squamous cell carcinoma.    His case was discussed in the skin cancer multidisciplinary tumor board.  That discussion included review of his PET scan, pathology report as well as discussion with Dr. Dalbert Batman as well as reviewing pathologist.  It appears that his tumor arising from the skin with  involvement of the right inguinal area.  His PET scan did not show any evidence of any metastatic disease.  His pulmonary abnormality is considered less likely to be metastatic disease.  From a management standpoint, surgical removal of his right inguinal node involvement remains his best course of action given the fact that he has not developed any widespread metastatic disease.  Systemic therapy will be deferred to a later date at this time.  After discussion today, he is agreeable to consider surgical removal of his right inguinal lymph nodes and appropriate referral to Dr. Dalbert Batman will be made.  2.  Consolidation of the anterior left upper lung lobe: This was found to be PET avid with SUV of 9.2 on images obtained on 07/10/2018.  These images were reviewed again with radiology and the differential diagnosis was discussed with the patient.  Metastatic squamous cell carcinoma of the skin into that area is considered less likely.  A new lung neoplasm is certainly a possibility given his history of smoking and age.  Nonmalignant etiology and infectious etiologies are also considered.  Percutaneous tissue biopsy was discussed.  Dr. Kathlene Cote from interventional radiology felt that would be high risk and this will not be performed due to possibility of pneumothorax.  Alternatively, interval imaging studies versus bronchoscopic evaluation would be his next option.  This was discussed today in detail and I will make the appropriate referral to pulmonary medicine based on his wishes.  I think it would be reasonable to consider bronchoscopic biopsy although location appears to be peripheral and might not be amendable to biopsy.  Biopsy cannot be performed, then repeat CT scan in 2 months would be a reasonable option.  Patient is agreeable with this plan at this time.    3.  Follow-up: He will be in 4 weeks to follow his progress.    30 minutes was spent with the patient face-to-face today.  More than  50% of time was dedicated to discussing the natural course of his disease, differential diagnosis, reviewing imaging studies and answering questions regarding future plan of care     [name], MD 3/2/20209:13 AM

## 2018-07-24 ENCOUNTER — Other Ambulatory Visit: Payer: Self-pay | Admitting: Dermatology

## 2018-07-24 ENCOUNTER — Encounter (HOSPITAL_BASED_OUTPATIENT_CLINIC_OR_DEPARTMENT_OTHER): Payer: PPO

## 2018-07-24 DIAGNOSIS — L97929 Non-pressure chronic ulcer of unspecified part of left lower leg with unspecified severity: Secondary | ICD-10-CM | POA: Diagnosis not present

## 2018-07-24 DIAGNOSIS — C44712 Basal cell carcinoma of skin of right lower limb, including hip: Secondary | ICD-10-CM | POA: Diagnosis not present

## 2018-07-24 DIAGNOSIS — D0461 Carcinoma in situ of skin of right upper limb, including shoulder: Secondary | ICD-10-CM | POA: Diagnosis not present

## 2018-07-24 DIAGNOSIS — C44719 Basal cell carcinoma of skin of left lower limb, including hip: Secondary | ICD-10-CM | POA: Diagnosis not present

## 2018-07-24 DIAGNOSIS — D0472 Carcinoma in situ of skin of left lower limb, including hip: Secondary | ICD-10-CM | POA: Diagnosis not present

## 2018-07-24 DIAGNOSIS — D0471 Carcinoma in situ of skin of right lower limb, including hip: Secondary | ICD-10-CM | POA: Diagnosis not present

## 2018-07-24 DIAGNOSIS — D0462 Carcinoma in situ of skin of left upper limb, including shoulder: Secondary | ICD-10-CM | POA: Diagnosis not present

## 2018-07-27 ENCOUNTER — Ambulatory Visit (HOSPITAL_COMMUNITY)
Admission: RE | Admit: 2018-07-27 | Discharge: 2018-07-27 | Disposition: A | Discharge status: Home / Self Care | Payer: PPO | Source: Ambulatory Visit | Attending: Internal Medicine | Admitting: Internal Medicine

## 2018-07-27 ENCOUNTER — Encounter (HOSPITAL_BASED_OUTPATIENT_CLINIC_OR_DEPARTMENT_OTHER): Payer: PPO | Attending: Internal Medicine

## 2018-07-27 ENCOUNTER — Other Ambulatory Visit (HOSPITAL_COMMUNITY): Payer: Self-pay | Admitting: Internal Medicine

## 2018-07-27 ENCOUNTER — Other Ambulatory Visit (HOSPITAL_COMMUNITY)
Admission: RE | Admit: 2018-07-27 | Discharge: 2018-07-27 | Disposition: A | Discharge status: Home / Self Care | Payer: PPO | Attending: Internal Medicine | Admitting: Internal Medicine

## 2018-07-27 DIAGNOSIS — B965 Pseudomonas (aeruginosa) (mallei) (pseudomallei) as the cause of diseases classified elsewhere: Secondary | ICD-10-CM | POA: Diagnosis not present

## 2018-07-27 DIAGNOSIS — I2510 Atherosclerotic heart disease of native coronary artery without angina pectoris: Secondary | ICD-10-CM | POA: Insufficient documentation

## 2018-07-27 DIAGNOSIS — L03032 Cellulitis of left toe: Secondary | ICD-10-CM | POA: Diagnosis not present

## 2018-07-27 DIAGNOSIS — L97521 Non-pressure chronic ulcer of other part of left foot limited to breakdown of skin: Secondary | ICD-10-CM | POA: Insufficient documentation

## 2018-07-27 DIAGNOSIS — L97529 Non-pressure chronic ulcer of other part of left foot with unspecified severity: Secondary | ICD-10-CM | POA: Diagnosis not present

## 2018-07-27 DIAGNOSIS — E1136 Type 2 diabetes mellitus with diabetic cataract: Secondary | ICD-10-CM | POA: Insufficient documentation

## 2018-07-27 DIAGNOSIS — L97526 Non-pressure chronic ulcer of other part of left foot with bone involvement without evidence of necrosis: Secondary | ICD-10-CM | POA: Insufficient documentation

## 2018-07-27 DIAGNOSIS — E11621 Type 2 diabetes mellitus with foot ulcer: Secondary | ICD-10-CM | POA: Insufficient documentation

## 2018-07-27 DIAGNOSIS — E1151 Type 2 diabetes mellitus with diabetic peripheral angiopathy without gangrene: Secondary | ICD-10-CM | POA: Insufficient documentation

## 2018-07-27 DIAGNOSIS — Z951 Presence of aortocoronary bypass graft: Secondary | ICD-10-CM | POA: Insufficient documentation

## 2018-07-27 DIAGNOSIS — E1140 Type 2 diabetes mellitus with diabetic neuropathy, unspecified: Secondary | ICD-10-CM | POA: Insufficient documentation

## 2018-07-27 DIAGNOSIS — I739 Peripheral vascular disease, unspecified: Secondary | ICD-10-CM | POA: Diagnosis not present

## 2018-07-31 LAB — AEROBIC CULTURE W GRAM STAIN (SUPERFICIAL SPECIMEN): Gram Stain: NONE SEEN

## 2018-08-03 DIAGNOSIS — B965 Pseudomonas (aeruginosa) (mallei) (pseudomallei) as the cause of diseases classified elsewhere: Secondary | ICD-10-CM | POA: Diagnosis not present

## 2018-08-03 DIAGNOSIS — E11621 Type 2 diabetes mellitus with foot ulcer: Secondary | ICD-10-CM | POA: Diagnosis not present

## 2018-08-10 DIAGNOSIS — Q6692 Congenital deformity of feet, unspecified, left foot: Secondary | ICD-10-CM | POA: Diagnosis not present

## 2018-08-10 DIAGNOSIS — M85871 Other specified disorders of bone density and structure, right ankle and foot: Secondary | ICD-10-CM | POA: Diagnosis not present

## 2018-08-10 DIAGNOSIS — I878 Other specified disorders of veins: Secondary | ICD-10-CM | POA: Diagnosis not present

## 2018-08-10 DIAGNOSIS — M19072 Primary osteoarthritis, left ankle and foot: Secondary | ICD-10-CM | POA: Diagnosis not present

## 2018-08-10 DIAGNOSIS — E11621 Type 2 diabetes mellitus with foot ulcer: Secondary | ICD-10-CM | POA: Diagnosis not present

## 2018-08-10 DIAGNOSIS — Q6689 Other  specified congenital deformities of feet: Secondary | ICD-10-CM | POA: Diagnosis not present

## 2018-08-10 DIAGNOSIS — M85872 Other specified disorders of bone density and structure, left ankle and foot: Secondary | ICD-10-CM | POA: Diagnosis not present

## 2018-08-10 DIAGNOSIS — L97524 Non-pressure chronic ulcer of other part of left foot with necrosis of bone: Secondary | ICD-10-CM | POA: Diagnosis not present

## 2018-08-10 DIAGNOSIS — I7090 Unspecified atherosclerosis: Secondary | ICD-10-CM | POA: Diagnosis not present

## 2018-08-13 ENCOUNTER — Other Ambulatory Visit (INDEPENDENT_AMBULATORY_CARE_PROVIDER_SITE_OTHER): Payer: PPO

## 2018-08-13 ENCOUNTER — Other Ambulatory Visit: Payer: Self-pay

## 2018-08-13 DIAGNOSIS — E782 Mixed hyperlipidemia: Secondary | ICD-10-CM

## 2018-08-13 DIAGNOSIS — E1151 Type 2 diabetes mellitus with diabetic peripheral angiopathy without gangrene: Secondary | ICD-10-CM | POA: Diagnosis not present

## 2018-08-13 LAB — LIPID PANEL
Cholesterol: 94 mg/dL (ref 0–200)
HDL: 30 mg/dL — ABNORMAL LOW (ref >39.00)
LDL Cholesterol: 41 mg/dL (ref 0–99)
NonHDL: 64.49
Total CHOL/HDL Ratio: 3
Triglycerides: 117 mg/dL (ref 0.0–149.0)
VLDL: 23.4 mg/dL (ref 0.0–40.0)

## 2018-08-13 LAB — COMPREHENSIVE METABOLIC PANEL
ALT: 25 U/L (ref 0–53)
AST: 21 U/L (ref 0–37)
Albumin: 4 g/dL (ref 3.5–5.2)
Alkaline Phosphatase: 69 U/L (ref 39–117)
BUN: 33 mg/dL — ABNORMAL HIGH (ref 6–23)
CO2: 21 mEq/L (ref 19–32)
Calcium: 9 mg/dL (ref 8.4–10.5)
Chloride: 109 mEq/L (ref 96–112)
Creatinine, Ser: 1.49 mg/dL (ref 0.40–1.50)
GFR: 45.69 mL/min — ABNORMAL LOW (ref >60.00)
Glucose, Bld: 128 mg/dL — ABNORMAL HIGH (ref 70–99)
Potassium: 4.5 mEq/L (ref 3.5–5.1)
Sodium: 137 mEq/L (ref 135–145)
Total Bilirubin: 0.5 mg/dL (ref 0.2–1.2)
Total Protein: 6.2 g/dL (ref 6.0–8.3)

## 2018-08-13 LAB — HEMOGLOBIN A1C: Hgb A1c MFr Bld: 7.1 % — ABNORMAL HIGH (ref 4.6–6.5)

## 2018-08-15 ENCOUNTER — Encounter: Payer: Self-pay | Admitting: Endocrinology

## 2018-08-15 ENCOUNTER — Ambulatory Visit: Payer: PPO | Admitting: Endocrinology

## 2018-08-15 ENCOUNTER — Other Ambulatory Visit: Payer: Self-pay

## 2018-08-15 VITALS — BP 118/60 | HR 80 | Temp 97.5°F | Ht 74.0 in | Wt 164.2 lb

## 2018-08-15 DIAGNOSIS — E1165 Type 2 diabetes mellitus with hyperglycemia: Secondary | ICD-10-CM | POA: Diagnosis not present

## 2018-08-15 NOTE — Progress Notes (Signed)
Patient ID: Mario [name], male   DOB: 05/01/1941, 78 y.o.   MRN: [id number]   Reason for Appointment: Diabetes follow-up   History of Present Illness   Diagnosis: Type 2 DIABETES MELITUS, date of diagnosis:  1989     Previous history: He was initially diagnosed when hospitalized for pancreatitis and his previous endocrinologist had treated him with multiple medications because of progression of his diabetes. He was also put on Cycloset  which he has tolerated maximum dose He has had adjustments of his medication dosages the last couple of years and Januvia was restarted in 5/14 when blood sugars were higher. Dosages have been adjusted based on renal function Previously an A1c levels have been ranging from 6.6-7.5 His metformin was increased  when renal function was better and glipizide was changed to glipizide ER 10 mg . Did not appear to have better blood sugar control with Invokana He was started on Victoza for improved control when his A1c was 7.9% in 02/2015  Recent history:    Non-insulin hypoglycemic drugs:  Glyset 50 mg bid, glipizide ER 10 mg , Metformin 1 g twice a day,   Victoza 1.8 mg daily       His A1c is  relatively higher at 7.1  Blood sugar patterns and problems:  In the last few weeks he has not been able to exercise much because of left foot ulcer  Otherwise had been playing pickle ball 3-4 times a week regularly and being active  Despite reminders he only is checking his blood sugars midday and some of them are before and some possibly after lunch  He thinks that his blood sugar may be higher if he eats doughnuts in the morning  His weight is down slightly  Not clear if his readings are high after other meals  Fasting readings not done but lab glucose was 128  As before he has been compliant with all his medications for diabetes  Side effects from medications: None.  Monitors blood glucose: Once a day or less .    Glucometer: Freestyle       PRE-MEAL Fasting Lunch Dinner Bedtime Overall  Glucose range:   77-175     Mean/median:      126    Meals: 2-3 meals per day, no lunch often (yogurt); breakfast is at 9 am cereal, [place] bacon, sometimes bread and eggs at breakfast; donuts occasionally      Dietician consultation last: 3/16        Physical activity: exercise: Was going to the gym 3+/7,Doing various exercises      Wt Readings from Last 3 Encounters:  08/15/18 164 lb 3.2 oz (74.5 kg)  07/23/18 167 lb 9.6 oz (76 kg)  07/11/18 166 lb 12.8 oz (75.7 kg)    Lab Results  Component Value Date   HGBA1C 7.1 (H) 08/13/2018   HGBA1C 6.7 (H) 04/09/2018   HGBA1C 7.1 (H) 01/01/2018   Lab Results  Component Value Date   MICROALBUR 9.7 (H) 01/01/2018   LDLCALC 41 08/13/2018   CREATININE 1.49 08/13/2018     LABS:  Lab on 08/13/2018  Component Date Value Ref Range Status  . Cholesterol 08/13/2018 94  0 - 200 mg/dL Final   ATP III Classification       Desirable:  < 200 mg/dL               Borderline High:  200 - 239 mg/dL  High:  > = 240 mg/dL  . Triglycerides 08/13/2018 117.0  0.0 - 149.0 mg/dL Final   Normal:  <150 mg/dLBorderline High:  150 - 199 mg/dL  . HDL 08/13/2018 30.00* >39.00 mg/dL Final  . VLDL 08/13/2018 23.4  0.0 - 40.0 mg/dL Final  . LDL Cholesterol 08/13/2018 41  0 - 99 mg/dL Final  . Total CHOL/HDL Ratio 08/13/2018 3   Final                  Men          Women1/2 Average Risk     3.4          3.3Average Risk          5.0          4.42X Average Risk          9.6          7.13X Average Risk          15.0          11.0                      . NonHDL 08/13/2018 64.49   Final   NOTE:  Non-HDL goal should be 30 mg/dL higher than patient's LDL goal (i.e. LDL goal of < 70 mg/dL, would have non-HDL goal of < 100 mg/dL)  . Sodium 08/13/2018 137  135 - 145 mEq/L Final  . Potassium 08/13/2018 4.5  3.5 - 5.1 mEq/L Final  . Chloride 08/13/2018 109  96 - 112 mEq/L Final  . CO2 08/13/2018 21  19 - 32 mEq/L  Final  . Glucose, Bld 08/13/2018 128* 70 - 99 mg/dL Final  . BUN 08/13/2018 33* 6 - 23 mg/dL Final  . Creatinine, Ser 08/13/2018 1.49  0.40 - 1.50 mg/dL Final  . Total Bilirubin 08/13/2018 0.5  0.2 - 1.2 mg/dL Final  . Alkaline Phosphatase 08/13/2018 69  39 - 117 U/L Final  . AST 08/13/2018 21  0 - 37 U/L Final  . ALT 08/13/2018 25  0 - 53 U/L Final  . Total Protein 08/13/2018 6.2  6.0 - 8.3 g/dL Final  . Albumin 08/13/2018 4.0  3.5 - 5.2 g/dL Final  . Calcium 08/13/2018 9.0  8.4 - 10.5 mg/dL Final  . GFR 08/13/2018 45.69* >60.00 mL/min Final  . Hgb A1c MFr Bld 08/13/2018 7.1* 4.6 - 6.5 % Final   Glycemic Control Guidelines for People with Diabetes:Non Diabetic:  <6%Goal of Therapy: <7%Additional Action Suggested:  >8%     Allergies as of 08/15/2018      Reactions   Penicillins Anaphylaxis   Has patient had a PCN reaction causing immediate rash, facial/tongue/throat swelling, SOB or lightheadedness with hypotension: Yes Has patient had a PCN reaction causing severe rash involving mucus membranes or skin necrosis: No Has patient had a PCN reaction that required hospitalization: Yes Has patient had a PCN reaction occurring within the last 10 years: Unknown If all of the above answers are "NO", then may proceed with Cephalosporin use.   Plavix [clopidogrel Bisulfate] Hives   Pletal [cilostazol] Itching      Medication List       Accurate as of August 15, 2018 11:15 AM. Always use your most recent med list.        acetaminophen 500 MG tablet Commonly known as:  TYLENOL Take 1,000 mg by mouth daily as needed for moderate pain.   allopurinol 100 MG tablet Commonly known  as:  ZYLOPRIM Take 1 tablet by mouth daily   ARTIFICIAL TEAR OP Apply 1 drop to eye daily as needed (dry eyes).   aspirin EC 81 MG tablet Take 81 mg by mouth daily.   atorvastatin 40 MG tablet Commonly known as:  LIPITOR take 1 tablet by mouth daily at 6:00pm   calcium carbonate 500 MG chewable tablet  Commonly known as:  TUMS - dosed in mg elemental calcium Chew 1 tablet by mouth daily as needed for indigestion or heartburn.   carvedilol 3.125 MG tablet Commonly known as:  COREG TAKE 1 TABLET BY MOUTH TWICE A DAY   CoQ10 200 MG Caps Take 200 mg by mouth daily.   fenofibrate micronized 43 MG capsule Commonly known as:  ANTARA Take 43 mg by mouth every evening.   ferrous sulfate 325 (65 FE) MG tablet Take 325 mg by mouth daily.   finasteride 5 MG tablet Commonly known as:  PROSCAR Take 5 mg by mouth every evening.   folic acid 694 MCG tablet Commonly known as:  FOLVITE Take 400 mcg by mouth daily.   freestyle lancets Use as instructed to check blood sugar 2 times per day dx code E11.65   FreeStyle Lite Devi USE TO TEST BLOOD SUGAR TWICE DAILY   glipiZIDE 10 MG 24 hr tablet Commonly known as:  GLUCOTROL XL TAKE 1 TABLET BY MOUTH AT BEDTIME FOR DIABETES.   glucose blood test strip Commonly known as:  FREESTYLE LITE Use as instructed to check blood sugar 2 times per day dx code E11.65   hydrocortisone cream 1 % Apply 1 application topically daily as needed (irritation).   Leg Cramp Relief Tabs Take 1 tablet by mouth daily as needed (leg cramps).   lisinopril 5 MG tablet Commonly known as:  PRINIVIL,ZESTRIL Take 1 tablet (5 mg total) by mouth daily.   metFORMIN 1000 MG tablet Commonly known as:  GLUCOPHAGE TAKE 1 TABLET BY MOUTH TWICE DAILY FOR BREAKFAST AND DINNER FOR DIABETES.   miglitol 50 MG tablet Commonly known as:  GLYSET TAKE 1 TABLET BY MOUTH TWICE DAILY AT BREAKFAST AND DINNER FOR DIABETES.   multivitamin-lutein Caps capsule Take 1 capsule by mouth 2 (two) times daily.   tamsulosin 0.4 MG Caps capsule Commonly known as:  FLOMAX Take 0.4 mg by mouth 2 (two) times daily.   Victoza 18 MG/3ML Sopn Generic drug:  liraglutide INJECT 1.8MG  INTO THE SKIN DAILY AT DINNERTIME FOR DIABETES.   vitamin B-12 1000 MCG tablet Commonly known as:   CYANOCOBALAMIN Take 1,000 mcg by mouth daily.   Vitamin D 50 MCG (2000 UT) Caps Take 2,000 Units by mouth daily.       Allergies:  Allergies  Allergen Reactions  . Penicillins Anaphylaxis    Has patient had a PCN reaction causing immediate rash, facial/tongue/throat swelling, SOB or lightheadedness with hypotension: Yes Has patient had a PCN reaction causing severe rash involving mucus membranes or skin necrosis: No Has patient had a PCN reaction that required hospitalization: Yes Has patient had a PCN reaction occurring within the last 10 years: Unknown If all of the above answers are "NO", then may proceed with Cephalosporin use.   Marland Kitchen Plavix [Clopidogrel Bisulfate] Hives  . Pletal [Cilostazol] Itching    Past Medical History:  Diagnosis Date  . Arthritis   . CAD (coronary artery disease)    a.  s/p IMI and BMS 1997;  b. Myoview 4/16:  inf scar with peri-infarct ischemia, EF 34%; high risk;  c. LHC 5/16:  3 v CAD >> CABG (free L-LAD, S-OM, S-dRCA)  . Carotid stenosis    a. carotid US 6/16:  bilat ICA 1-39%  . Chronic kidney disease, stage II (mild)   . Chronic systolic CHF (congestive heart failure) ([employer])   . Essential hypertension, benign   . Gout   . HLD (hyperlipidemia)   . Ischemic cardiomyopathy    a. EF by myoview 4/16 34%; b. LHC 5/16: EF 40-45%;  c. intraop TEE 6/16: EF 45-50%  . Orthostasis   . PAD (peripheral artery disease) ([facility])    Dr. Fletcher Anon  . Pneumonia    hx of  . Type II or unspecified type diabetes mellitus without mention of complication, uncontrolled     Past Surgical History:  Procedure Laterality Date  . ABDOMINAL AORTAGRAM N/A 10/09/2013   Procedure: ABDOMINAL Maxcine Ham;  Surgeon: Wellington Hampshire, MD;  Location: [facility];  Service: Cardiovascular;  Laterality: N/A;  . ABDOMINAL AORTOGRAM N/A 12/28/2016   Procedure: ABDOMINAL AORTOGRAM;  Surgeon: Waynetta Sandy, MD;  Location: [facility];  Service: Cardiovascular;  Laterality:  N/A;  . CARDIAC CATHETERIZATION N/A 10/02/2014   Procedure: Left Heart Cath and Coronary Angiography;  Surgeon: Belva Crome, MD;  Location: [facility];  Service: Cardiovascular;  Laterality: N/A;  . CATARACT EXTRACTION    . CHOLECYSTECTOMY    . COLONOSCOPY    . CORONARY ARTERY BYPASS GRAFT N/A 11/03/2014   Procedure: CORONARY ARTERY BYPASS GRAFTING  times three using left internal mammary and right greater saphenous vein;  Surgeon: Gaye Pollack, MD;  Location: [facility];  Service: Open Heart Surgery;  Laterality: N/A;  . HERNIA REPAIR    . LOWER EXTREMITY ANGIOGRAPHY Bilateral 12/28/2016   Procedure: Lower Extremity Angiography;  Surgeon: Waynetta Sandy, MD;  Location: [facility];  Service: Cardiovascular;  Laterality: Bilateral;  . MELANOMA EXCISION    . PERIPHERAL VASCULAR BALLOON ANGIOPLASTY Right 12/28/2016   Procedure: PERIPHERAL VASCULAR BALLOON ANGIOPLASTY;  Surgeon: Waynetta Sandy, MD;  Location: [facility];  Service: Cardiovascular;  Laterality: Right;  SFA UNSUCCESSFUL PTA  UNABLE TO CROSS LESION  . SKIN LESION EXCISION     multiple  . TEE WITHOUT CARDIOVERSION N/A 11/03/2014   Procedure: TRANSESOPHAGEAL ECHOCARDIOGRAM (TEE);  Surgeon: Gaye Pollack, MD;  Location: [facility];  Service: Open Heart Surgery;  Laterality: N/A;    Family History  Problem Relation Age of Onset  . Colon cancer Father   . Heart disease Mother     Social History:  reports that he quit smoking about 3 years ago. His smoking use included cigarettes. He has a 25.00 pack-year smoking history. He has never used smokeless tobacco. He reports that he does not drink alcohol or use drugs.  Review of Systems:   Hypertension:  taking lisinopril 5 mg and carvedilol low-dose and blood pressure is  controlled, managed by cardiologist  Lipids: Well controlled with atorvastatin 40 mg,   HDL is  low, triglycerides now normal     Lab Results  Component Value Date   CHOL 94  08/13/2018   HDL 30.00 (L) 08/13/2018   LDLCALC 41 08/13/2018   TRIG 117.0 08/13/2018   CHOLHDL 3 08/13/2018    Claudication: Stable symptoms  He appears to have osteomyelitis of his second left toe, recent visit with his surgeon at [facility] reviewed He has been given a topical antibiotic but previously started on Cipro and he has  not discussed findings of his recent x-ray with the treating surgeon     Examination:   BP 118/60 (BP Location: Left Arm, Patient Position: Sitting, Cuff Size: Normal)   Pulse 80   Temp (!) 97.5 F (36.4 C) (Oral)   Ht 6\' 2"  (1.88 m)   Wt 164 lb 3.2 oz (74.5 kg)   SpO2 97%   BMI 21.08 kg/m   Body mass index is 21.08 kg/m.     ASSESSMENT/ PLAN:   Diabetes type 2, nonobese:  See history of present illness for detailed discussion of his current management, blood sugar patterns and problems identified  Blood glucose control is reasonably good with A1c just over 7% However not clear when he is having high readings except occasionally when he goes off his diet at breakfast time On multiple medications including Victoza and Precose to help postprandial hyperglycemia Fasting glucose 128  Discussed need to consistently check readings after meals He needs to be consistent with improving diet  LIPIDS: On high intensity statin and continue 40 mg Lipitor with LDL 41  Left toe osteomyelitis with vascular disease: He will call his treating surgeon today to establish the need for a antibiotic for treating the staph infection appearing in his culture  There are no Patient Instructions on file for this visit.   [name] 08/15/2018, 11:15 AM

## 2018-08-15 NOTE — Patient Instructions (Addendum)
Check blood sugars on waking up 2 days a week  Also check blood sugars about 2 hours after meals and do this after different meals by rotation  Recommended blood sugar levels on waking up are 90-130 and about 2 hours after meal is 130-160  Please bring your blood sugar monitor to each visit, thank you

## 2018-08-16 ENCOUNTER — Other Ambulatory Visit: Payer: Self-pay

## 2018-08-16 ENCOUNTER — Encounter: Payer: Self-pay | Admitting: Internal Medicine

## 2018-08-16 ENCOUNTER — Ambulatory Visit (INDEPENDENT_AMBULATORY_CARE_PROVIDER_SITE_OTHER): Payer: PPO | Admitting: Internal Medicine

## 2018-08-16 VITALS — BP 122/60 | Ht 74.0 in | Wt 164.8 lb

## 2018-08-16 DIAGNOSIS — R05 Cough: Secondary | ICD-10-CM

## 2018-08-16 DIAGNOSIS — R918 Other nonspecific abnormal finding of lung field: Secondary | ICD-10-CM | POA: Diagnosis not present

## 2018-08-16 DIAGNOSIS — R053 Chronic cough: Secondary | ICD-10-CM

## 2018-08-16 NOTE — Patient Instructions (Addendum)
I will call you next week to see about scheduling bronchoscopy.    You would need to stop your aspirin 3 days prior and nothing to eat or drink after midnight.   Come to Outpatient Registration at [facility] (behind the ER) at 715 am on the day of the test which we'll decide next week  Add:  Check Quant Tb, cbc and esr

## 2018-08-16 NOTE — Assessment & Plan Note (Signed)
Present x years on ACEi  ACE inhibitors are problematic in  pts with airway complaints because  even experienced pulmonologists can't always distinguish ace effects from copd/asthma.  By themselves they don't actually cause a problem, much like oxygen can't by itself start a fire, but they certainly serve as a powerful catalyst or enhancer for any "fire"  or inflammatory process in the upper airway, be it caused by an ET  tube or more commonly reflux (especially in the obese or pts with known GERD or who are on biphoshonates).     Presently not severe enough to warrant trial off acei but may need to consider in future and replace with ARB    Total time devoted to counseling  > 50 % of initial 60 min office visit:  review case with pt/ discussion of options/alternatives/ personally creating written customized instructions  in presence of pt  then going over those specific  Instructions directly with the pt including how to use all of the meds but in particular covering each new medication in detail and the difference between the maintenance= "automatic" meds and the prns using an action plan format for the latter (If this problem/symptom => do that organization reading Left to right).  Please see AVS from this visit for a full list of these instructions which I personally wrote for this pt and  are unique to this visit.

## 2018-08-16 NOTE — Assessment & Plan Note (Addendum)
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    Discussed in detail all the  indications, usual  risks and alternatives  relative to the benefits with patient who agrees to proceed with w/u as outlined (no rush to decide either way)   In meantime will check baseline cbc/esr/ Quant TB

## 2018-08-16 NOTE — Progress Notes (Signed)
Mario [name], male    DOB: 1940/08/27,   MRN: [id number]   Brief patient profile:  77yowm quit smoking  for cabg 2016 (Barle's advice)  with mild doe with tennis improved some p cabg and did fine until dx of sq cell ca R leg with Pos inguinal bx 06/22/18 > CT chest 06/29/2018 with LUL density which lit up on PET 07/10/18 with in retropect a cough x years on ACEi worse noct but does not wake him up so referred to pulmonary clinic 08/16/2018 by Dr   [name] for ? Fob / bx      History of Present Illness  08/16/2018  Pulmonary/ 1st office eval/Janiene Aarons  Chief Complaint  Patient presents with  . Pulmonary Consult    Referred by Dr. [name] for eval of lung mass.   Dyspnea:  Tennis / o/w no doe Cough: see above, no h/o hemoptysis and only scant white mucus   Sleep: wakes up with cough x once a week , sleeps L side down / no night sweats  SABA use: none    No obvious day to day or daytime variability or assoc excess/ purulent sputum or mucus plugs or hemoptysis or cp or chest tightness, subjective wheeze or overt sinus or hb symptoms.   Sleeping  without nocturnal  or early am exacerbation  of respiratory  c/o's or need for noct saba. Also denies any obvious fluctuation of symptoms with weather or environmental changes or other aggravating or alleviating factors except as outlined above   No unusual exposure hx or h/o childhood pna/ asthma or knowledge of premature birth.  Current Allergies, Complete Past Medical History, Past Surgical History, Family History, and Social History were reviewed in [employer] record.  ROS  The following are not active complaints unless bolded Hoarseness, sore throat, dysphagia, dental problems, itching, sneezing,  nasal congestion or discharge of excess mucus or purulent secretions, ear ache,   fever, chills, sweats, unintended wt loss or wt gain, classically pleuritic or exertional cp,  orthopnea pnd or arm/hand swelling  or leg swelling,  presyncope, palpitations, abdominal pain, anorexia, nausea, vomiting, diarrhea  or change in bowel habits or change in bladder habits, change in stools or change in urine, dysuria, hematuria,  rash, arthralgias, visual complaints, headache, numbness, weakness or ataxia or problems with walking or coordination,  change in mood or  memory.           Past Medical History:  Diagnosis Date  . Arthritis   . CAD (coronary artery disease)    a.  s/p IMI and BMS 1997;  b. Myoview 4/16:  inf scar with peri-infarct ischemia, EF 34%; high risk;  c. LHC 5/16:  3 v CAD >> CABG (free L-LAD, S-OM, S-dRCA)  . Carotid stenosis    a. carotid US 6/16:  bilat ICA 1-39%  . Chronic kidney disease, stage II (mild)   . Chronic systolic CHF (congestive heart failure) ([facility])   . Essential hypertension, benign   . Gout   . HLD (hyperlipidemia)   . Ischemic cardiomyopathy    a. EF by myoview 4/16 34%; b. LHC 5/16: EF 40-45%;  c. intraop TEE 6/16: EF 45-50%  . Orthostasis   . PAD (peripheral artery disease) ([facility])    Dr. Fletcher Anon  . Pneumonia    hx of  . Type II or unspecified type diabetes mellitus without mention of complication, uncontrolled     Outpatient Medications Prior to Visit  Medication Sig Dispense Refill  .  acetaminophen (TYLENOL) 500 MG tablet Take 1,000 mg by mouth daily as needed for moderate pain.    Marland Kitchen allopurinol (ZYLOPRIM) 100 MG tablet Take 1 tablet by mouth daily 90 tablet 0  . ARTIFICIAL TEAR OP Apply 1 drop to eye daily as needed (dry eyes).    Marland Kitchen aspirin EC 81 MG tablet Take 81 mg by mouth daily.    Marland Kitchen atorvastatin (LIPITOR) 40 MG tablet take 1 tablet by mouth daily at 6:00pm  (Patient taking differently: Take 40 mg by mouth daily. ) 90 tablet 0  . Blood Glucose Monitoring Suppl (FREESTYLE LITE) DEVI USE TO TEST BLOOD SUGAR TWICE DAILY 1 each 1  . calcium carbonate (TUMS - DOSED IN MG ELEMENTAL CALCIUM) 500 MG chewable tablet Chew 1 tablet by mouth daily as needed for indigestion or heartburn.     . carvedilol (COREG) 3.125 MG tablet TAKE 1 TABLET BY MOUTH TWICE A DAY (Patient taking differently: Take 3.125 mg by mouth 2 (two) times daily with a meal. ) 180 tablet 3  . Cholecalciferol (VITAMIN D) 2000 units CAPS Take 2,000 Units by mouth daily.    . Coenzyme Q10 (COQ10) 200 MG CAPS Take 200 mg by mouth daily.    . fenofibrate micronized (ANTARA) 43 MG capsule Take 43 mg by mouth every evening.    . ferrous sulfate 325 (65 FE) MG tablet Take 325 mg by mouth daily.    . finasteride (PROSCAR) 5 MG tablet Take 5 mg by mouth every evening.   3  . folic acid (FOLVITE) 353 MCG tablet Take 400 mcg by mouth daily.    Marland Kitchen glipiZIDE (GLUCOTROL XL) 10 MG 24 hr tablet TAKE 1 TABLET BY MOUTH AT BEDTIME FOR DIABETES. (Patient taking differently: Take 10 mg by mouth at bedtime. ) 30 tablet 2  . glucose blood (FREESTYLE LITE) test strip Use as instructed to check blood sugar 2 times per day dx code E11.65 100 each 3  . Homeopathic Products (LEG CRAMP RELIEF) TABS Take 1 tablet by mouth daily as needed (leg cramps).    . hydrocortisone cream 1 % Apply 1 application topically daily as needed (irritation).    . Lancets (FREESTYLE) lancets Use as instructed to check blood sugar 2 times per day dx code E11.65 100 each 3  . lisinopril (PRINIVIL,ZESTRIL) 5 MG tablet Take 1 tablet (5 mg total) by mouth daily. 90 tablet 3  . metFORMIN (GLUCOPHAGE) 1000 MG tablet TAKE 1 TABLET BY MOUTH TWICE DAILY FOR BREAKFAST AND DINNER FOR DIABETES. (Patient taking differently: Take 1,000 mg by mouth 2 (two) times daily with a meal. ) 60 tablet 2  . miglitol (GLYSET) 50 MG tablet TAKE 1 TABLET BY MOUTH TWICE DAILY AT BREAKFAST AND DINNER FOR DIABETES. (Patient taking differently: Take 50 mg by mouth 2 (two) times daily. ) 60 tablet 2  . multivitamin-lutein (OCUVITE-LUTEIN) CAPS capsule Take 1 capsule by mouth 2 (two) times daily.    . tamsulosin (FLOMAX) 0.4 MG CAPS capsule Take 0.4 mg by mouth 2 (two) times daily.    Marland Kitchen VICTOZA 18  MG/3ML SOPN INJECT 1.8MG INTO THE SKIN DAILY AT DINNERTIME FOR DIABETES. (Patient taking differently: Inject 1.8 mg into the skin daily with supper. ) 9 mL 2  . vitamin B-12 (CYANOCOBALAMIN) 1000 MCG tablet Take 1,000 mcg by mouth daily.          Objective:     BP 122/60   Ht 6' 2"  (1.88 m)   Wt 164 lb 12.8 oz (  74.8 kg)   BMI 21.16 kg/m      Wt Readings from Last 3 Encounters:  08/16/18 164 lb 12.8 oz (74.8 kg)  08/15/18 164 lb 3.2 oz (74.5 kg)  07/23/18 167 lb 9.6 oz (76 kg)     pleasant amb elderly thin wm nad  HEENT: nl dentition, turbinates bilaterally, and oropharynx. Nl external ear canals without cough reflex   NECK :  without JVD/Nodes/TM/ nl carotid upstrokes bilaterally   LUNGS: no acc muscle use,  Nl contour chest which is clear to A and P bilaterally without cough on insp or exp maneuvers   CV:  RRR  no s3 or murmur or increase in P2, and no edema   ABD:  soft and nontender with nl inspiratory excursion in the supine position. No bruits or organomegaly appreciated, bowel sounds nl  MS:  Nl gait/ ext warm without deformities, calf tenderness, cyanosis or clubbing No obvious joint restrictions   SKIN: warm and dry without lesions    NEURO:  alert, approp, nl sensorium with  no motor or cerebellar deficits apparent.    I personally reviewed images and agree with radiology impression as follows:  PET  07/10/2018  1. Hypermetabolism (max SUV 9.2) associated with the irregular 4.1 cm masslike focus of consolidation in the anterior left upper lung lobe, which has not changed since the recent 06/29/2018 chest CT, making pneumonia less likely. Findings are worrisome for primary bronchogenic carcinoma. 2. No hypermetabolic thoracic adenopathy. 3. 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    Discussed in detail all the  indications, usual  risks and alternatives  relative to the benefits with patient who agrees to proceed with w/u as outlined (no rush to decide either way)    In meantime will check baseline cbc/esr/ Quant TB       Chronic cough Present x years on ACEi  ACE inhibitors are problematic in  pts with airway complaints because  even experienced pulmonologists can't always distinguish ace effects from copd/asthma.  By themselves they don't actually cause a problem, much like oxygen can't by itself start a fire, but they certainly serve as a powerful catalyst or enhancer for any "fire"  or inflammatory process in the upper airway, be it caused by an ET  tube or more commonly reflux (especially in the obese or pts with known GERD or who are on biphoshonates).   Presently not severe enough to warrant trial off acei but may need to consider in future and replace with ARB       Total time devoted to counseling  > 50 % of initial 60 min office visit:  review  case with pt/ discussion of options/alternatives/ personally creating written customized instructions  in presence of pt  then going over those specific  Instructions directly with the pt including how to use all of the meds but in particular covering each new medication in detail and the difference between the maintenance=  "automatic" meds and the prns using an action plan format for the latter (If this problem/symptom => do that organization reading Left to right).  Please see AVS from this visit for a full list of these instructions which I personally wrote for this pt and  are unique to this visit.      [name], MD 08/16/2018

## 2018-08-22 ENCOUNTER — Other Ambulatory Visit: Payer: Self-pay

## 2018-08-22 ENCOUNTER — Inpatient Hospital Stay: Payer: PPO | Attending: Oncology | Admitting: Oncology

## 2018-08-22 DIAGNOSIS — Z7982 Long term (current) use of aspirin: Secondary | ICD-10-CM | POA: Insufficient documentation

## 2018-08-22 DIAGNOSIS — Z7984 Long term (current) use of oral hypoglycemic drugs: Secondary | ICD-10-CM | POA: Diagnosis not present

## 2018-08-22 DIAGNOSIS — C4492 Squamous cell carcinoma of skin, unspecified: Secondary | ICD-10-CM | POA: Diagnosis not present

## 2018-08-22 DIAGNOSIS — R918 Other nonspecific abnormal finding of lung field: Secondary | ICD-10-CM | POA: Diagnosis present

## 2018-08-22 DIAGNOSIS — C3490 Malignant neoplasm of unspecified part of unspecified bronchus or lung: Secondary | ICD-10-CM

## 2018-08-22 DIAGNOSIS — Z79899 Other long term (current) drug therapy: Secondary | ICD-10-CM | POA: Insufficient documentation

## 2018-08-22 DIAGNOSIS — H2512 Age-related nuclear cataract, left eye: Secondary | ICD-10-CM | POA: Diagnosis not present

## 2018-08-22 DIAGNOSIS — C7989 Secondary malignant neoplasm of other specified sites: Secondary | ICD-10-CM | POA: Diagnosis not present

## 2018-08-22 NOTE — Progress Notes (Addendum)
Hematology and Oncology Follow Up Visit  Mario [name] [id number] 01-15-1941 78 y.o. 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  He developed inguinal metastasis and a lung mass.    Prior Therapy: Status post surgical excision of a right knee skin lesion.  Pathology showed squamous cell carcinoma in December 2019.  Current therapy: Under consideration for surgical therapy.  Interim History: Mario [name] is here for a repeat evaluation.  Since the last visit, he reports no major changes in his health.  He denies any pulmonary complaints, groin pain or discomfort.  He denies any cough or shortness of breath.  His performance status and quality of life remains unchanged.   Patient denied any alteration mental status, neuropathy, confusion or dizziness.  Denies any headaches or lethargy.  Denies any night sweats, weight loss or changes in appetite.  Denied orthopnea, dyspnea on exertion or chest discomfort.  Denies shortness of breath, difficulty breathing hemoptysis or cough.  Denies any abdominal distention, nausea, early satiety or dyspepsia.  Denies any hematuria, frequency, dysuria or nocturia.  Denies any skin irritation, dryness or rash.  Denies any ecchymosis or petechiae.  Denies any lymphadenopathy or clotting.  Denies any heat or cold intolerance.  Denies any anxiety or depression.  Remaining review of system is negative.     Medications: I have reviewed the patient's current medications.  Current Outpatient Medications  Medication Sig Dispense Refill  . acetaminophen (TYLENOL) 500 MG tablet Take 1,000 mg by mouth daily as needed for moderate pain.    Marland Kitchen allopurinol (ZYLOPRIM) 100 MG tablet Take 1 tablet by mouth daily 90 tablet 0  . ARTIFICIAL TEAR OP Apply 1 drop to eye daily as needed (dry eyes).    Marland Kitchen aspirin EC 81 MG tablet Take 81 mg by  mouth daily.    Marland Kitchen atorvastatin (LIPITOR) 40 MG tablet take 1 tablet by mouth daily at 6:00pm  (Patient taking differently: Take 40 mg by mouth daily. ) 90 tablet 0  . Blood Glucose Monitoring Suppl (FREESTYLE LITE) DEVI USE TO TEST BLOOD SUGAR TWICE DAILY 1 each 1  . calcium carbonate (TUMS - DOSED IN MG ELEMENTAL CALCIUM) 500 MG chewable tablet Chew 1 tablet by mouth daily as needed for indigestion or heartburn.    . carvedilol (COREG) 3.125 MG tablet TAKE 1 TABLET BY MOUTH TWICE A DAY (Patient taking differently: Take 3.125 mg by mouth 2 (two) times daily with a meal. ) 180 tablet 3  . Cholecalciferol (VITAMIN D) 2000 units CAPS Take 2,000 Units by mouth daily.    . Coenzyme Q10 (COQ10) 200 MG CAPS Take 200 mg by mouth daily.    . fenofibrate micronized (ANTARA) 43 MG capsule Take 43 mg by mouth every evening.    . ferrous sulfate 325 (65 FE) MG tablet Take 325 mg by mouth daily.    . finasteride (PROSCAR) 5 MG tablet Take 5 mg by mouth every evening.   3  . folic acid (FOLVITE) 601 MCG tablet Take 400 mcg by mouth daily.    Marland Kitchen glipiZIDE (GLUCOTROL XL) 10 MG 24 hr tablet TAKE 1 TABLET BY MOUTH AT BEDTIME FOR DIABETES. (Patient taking differently: Take 10 mg by mouth at bedtime. ) 30 tablet 2  . glucose blood (FREESTYLE LITE) test strip Use as instructed to check blood sugar 2 times per day dx code E11.65 100 each 3  .  Homeopathic Products (LEG CRAMP RELIEF) TABS Take 1 tablet by mouth daily as needed (leg cramps).    . hydrocortisone cream 1 % Apply 1 application topically daily as needed (irritation).    . Lancets (FREESTYLE) lancets Use as instructed to check blood sugar 2 times per day dx code E11.65 100 each 3  . lisinopril (PRINIVIL,ZESTRIL) 5 MG tablet Take 1 tablet (5 mg total) by mouth daily. 90 tablet 3  . metFORMIN (GLUCOPHAGE) 1000 MG tablet TAKE 1 TABLET BY MOUTH TWICE DAILY FOR BREAKFAST AND DINNER FOR DIABETES. (Patient taking differently: Take 1,000 mg by mouth 2 (two) times daily  with a meal. ) 60 tablet 2  . miglitol (GLYSET) 50 MG tablet TAKE 1 TABLET BY MOUTH TWICE DAILY AT BREAKFAST AND DINNER FOR DIABETES. (Patient taking differently: Take 50 mg by mouth 2 (two) times daily. ) 60 tablet 2  . multivitamin-lutein (OCUVITE-LUTEIN) CAPS capsule Take 1 capsule by mouth 2 (two) times daily.    . tamsulosin (FLOMAX) 0.4 MG CAPS capsule Take 0.4 mg by mouth 2 (two) times daily.    Marland Kitchen VICTOZA 18 MG/3ML SOPN INJECT 1.8MG  INTO THE SKIN DAILY AT DINNERTIME FOR DIABETES. (Patient taking differently: Inject 1.8 mg into the skin daily with supper. ) 9 mL 2  . vitamin B-12 (CYANOCOBALAMIN) 1000 MCG tablet Take 1,000 mcg by mouth daily.     No current facility-administered medications for this visit.      Allergies:  Allergies  Allergen Reactions  . Penicillins Anaphylaxis    Has patient had a PCN reaction causing immediate rash, facial/tongue/throat swelling, SOB or lightheadedness with hypotension: Yes Has patient had a PCN reaction causing severe rash involving mucus membranes or skin necrosis: No Has patient had a PCN reaction that required hospitalization: Yes Has patient had a PCN reaction occurring within the last 10 years: Unknown If all of the above answers are "NO", then may proceed with Cephalosporin use.   Marland Kitchen Plavix [Clopidogrel Bisulfate] Hives  . Pletal [Cilostazol] Itching    Past Medical History, Surgical history, Social history, and Family History were reviewed and updated.    Physical Exam:  Blood pressure 137/64, pulse 77, temperature 97.6 F (36.4 C), temperature source Oral, resp. rate 17, height 6\' 2"  (1.88 m), weight 166 lb 14.4 oz (75.7 kg), SpO2 100 %.  ECOG: 1   General appearance: Alert, awake without any distress. Head: Atraumatic without abnormalities Oropharynx: Without any thrush or ulcers. Eyes: No scleral icterus. Lymph nodes: No lymphadenopathy noted in the cervical, supraclavicular, or axillary nodes Heart:regular rate and  rhythm, without any murmurs or gallops.   Lung: Clear to auscultation without any rhonchi, wheezes or dullness to percussion. Abdomin: Soft, nontender without any shifting dullness or ascites. Musculoskeletal: No clubbing or cyanosis. Neurological: No motor or sensory deficits. Skin: No rashes or lesions.     Lab Results: Lab Results  Component Value Date   WBC 7.2 06/22/2018   HGB 9.8 (L) 06/22/2018   HCT 32.3 (L) 06/22/2018   MCV 93.6 06/22/2018   PLT 179 06/22/2018     Chemistry      Component Value Date/Time   NA 137 08/13/2018 1009   K 4.5 08/13/2018 1009   CL 109 08/13/2018 1009   CO2 21 08/13/2018 1009   BUN 33 (H) 08/13/2018 1009   CREATININE 1.49 08/13/2018 1009   CREATININE 1.14 08/13/2013 1043      Component Value Date/Time   CALCIUM 9.0 08/13/2018 1009   ALKPHOS 69 08/13/2018 1009  AST 21 08/13/2018 1009   ALT 25 08/13/2018 1009   BILITOT 0.5 08/13/2018 1009        Impression and Plan:  78 year old man with the following:  1.    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  This was documented in 2019 with an inguinal lesion.    I have recommended proceeding with right inguinal lymph node dissection and this was discussed with Dr. Dalbert Batman as well as the patient today.  Upon completing this procedure, he will undergo staging work-up and systemic therapy will be used if he develops systemic recurrence.    2. Left upper lung mass diagnosed in February 2020: The differential diagnosis includes a primary lung neoplasm versus metastatic squamous cell carcinoma.   He underwent pulmonary evaluation and option for bronchoscopy and possible excisional biopsy of the left upper lobe was reviewed with the patient.  He will have a bronchoscopy in the future and will continue to follow-up with pulmonary medicine.    3.  Follow-up: He will be in 3 months for a repeat imaging studies.    15 minutes was spent with the  patient face-to-face today.  More than 50% of time was spent on reviewing his disease status, answering questions regarding future plan of care and coordinating his future plan of therapy.     [name], MD 4/1/20209:22 AM

## 2018-08-23 ENCOUNTER — Telehealth: Payer: Self-pay | Admitting: Oncology

## 2018-08-23 DIAGNOSIS — L97524 Non-pressure chronic ulcer of other part of left foot with necrosis of bone: Secondary | ICD-10-CM | POA: Diagnosis not present

## 2018-08-23 DIAGNOSIS — E11621 Type 2 diabetes mellitus with foot ulcer: Secondary | ICD-10-CM | POA: Diagnosis not present

## 2018-08-23 NOTE — Telephone Encounter (Signed)
No 4/1 los °

## 2018-08-30 ENCOUNTER — Telehealth: Payer: Self-pay | Admitting: *Deleted

## 2018-08-30 DIAGNOSIS — E1151 Type 2 diabetes mellitus with diabetic peripheral angiopathy without gangrene: Secondary | ICD-10-CM | POA: Diagnosis not present

## 2018-08-30 DIAGNOSIS — I70203 Unspecified atherosclerosis of native arteries of extremities, bilateral legs: Secondary | ICD-10-CM | POA: Diagnosis not present

## 2018-08-30 DIAGNOSIS — L97524 Non-pressure chronic ulcer of other part of left foot with necrosis of bone: Secondary | ICD-10-CM | POA: Diagnosis not present

## 2018-08-30 DIAGNOSIS — I480 Paroxysmal atrial fibrillation: Secondary | ICD-10-CM | POA: Diagnosis not present

## 2018-08-30 DIAGNOSIS — E11621 Type 2 diabetes mellitus with foot ulcer: Secondary | ICD-10-CM | POA: Diagnosis not present

## 2018-08-30 DIAGNOSIS — I5032 Chronic diastolic (congestive) heart failure: Secondary | ICD-10-CM | POA: Diagnosis not present

## 2018-08-30 DIAGNOSIS — Z7901 Long term (current) use of anticoagulants: Secondary | ICD-10-CM | POA: Diagnosis not present

## 2018-08-30 DIAGNOSIS — I110 Hypertensive heart disease with heart failure: Secondary | ICD-10-CM | POA: Diagnosis not present

## 2018-08-30 DIAGNOSIS — D649 Anemia, unspecified: Secondary | ICD-10-CM | POA: Diagnosis not present

## 2018-08-30 DIAGNOSIS — I739 Peripheral vascular disease, unspecified: Secondary | ICD-10-CM | POA: Diagnosis not present

## 2018-08-30 DIAGNOSIS — D72829 Elevated white blood cell count, unspecified: Secondary | ICD-10-CM | POA: Diagnosis not present

## 2018-08-30 DIAGNOSIS — Z792 Long term (current) use of antibiotics: Secondary | ICD-10-CM | POA: Diagnosis not present

## 2018-08-30 NOTE — Telephone Encounter (Signed)
Received at call from Iris Pert, [place] at [place] regarding this patient's decreased blood flow in his left foot. They evidently did some kind of an ABI and this showed "No blood flow", I am not able to see the actual results in the Care Everywhere section of Epic. The patient last saw [name] in 04-2018 for his protocol visit and was to return in June. I will bring this patient in next week for repeat ABIs and to see [name] for follow up on this problem. He has had a left toe ulcer x 6 months according to Rachel's note from today.

## 2018-09-03 ENCOUNTER — Telehealth: Payer: Self-pay | Admitting: *Deleted

## 2018-09-03 ENCOUNTER — Other Ambulatory Visit: Payer: Self-pay | Admitting: General Surgery

## 2018-09-03 DIAGNOSIS — I10 Essential (primary) hypertension: Secondary | ICD-10-CM | POA: Diagnosis not present

## 2018-09-03 DIAGNOSIS — Z951 Presence of aortocoronary bypass graft: Secondary | ICD-10-CM | POA: Diagnosis not present

## 2018-09-03 DIAGNOSIS — I2510 Atherosclerotic heart disease of native coronary artery without angina pectoris: Secondary | ICD-10-CM | POA: Diagnosis not present

## 2018-09-03 DIAGNOSIS — M4316 Spondylolisthesis, lumbar region: Secondary | ICD-10-CM | POA: Diagnosis not present

## 2018-09-03 DIAGNOSIS — I129 Hypertensive chronic kidney disease with stage 1 through stage 4 chronic kidney disease, or unspecified chronic kidney disease: Secondary | ICD-10-CM | POA: Diagnosis not present

## 2018-09-03 DIAGNOSIS — R590 Localized enlarged lymph nodes: Secondary | ICD-10-CM | POA: Diagnosis not present

## 2018-09-03 DIAGNOSIS — I739 Peripheral vascular disease, unspecified: Secondary | ICD-10-CM | POA: Diagnosis not present

## 2018-09-03 DIAGNOSIS — E1122 Type 2 diabetes mellitus with diabetic chronic kidney disease: Secondary | ICD-10-CM | POA: Diagnosis not present

## 2018-09-03 DIAGNOSIS — C44701 Unspecified malignant neoplasm of skin of unspecified lower limb, including hip: Secondary | ICD-10-CM | POA: Diagnosis not present

## 2018-09-03 DIAGNOSIS — E785 Hyperlipidemia, unspecified: Secondary | ICD-10-CM | POA: Diagnosis not present

## 2018-09-03 NOTE — Telephone Encounter (Signed)
   [facility] Pre-operative Risk Assessment    Request for surgical clearance:  1. What type of surgery is being performed? SUPERFICIAL RIGHT INGUINAL LYMPH NODE DISSECTION   2. When is this surgery scheduled? TBD   3. What type of clearance is required (medical clearance vs. Pharmacy clearance to hold med vs. Both)? MEDICAL  4. Are there any medications that need to be held prior to surgery and how long?ASA   5. Practice name and name of physician performing surgery? [facility];  SURGEON NOT LISTED  6. What is your office phone number [phone]    7.   What is your office fax number [phone] FAX TO [place], RMA   8.   Anesthesia type (None, local, MAC, general) ? GENERAL   Julaine Hua 09/03/2018, 11:21 AM  _________________________________________________________________   (provider comments below)

## 2018-09-04 ENCOUNTER — Other Ambulatory Visit: Payer: Self-pay

## 2018-09-04 DIAGNOSIS — I779 Disorder of arteries and arterioles, unspecified: Secondary | ICD-10-CM

## 2018-09-04 NOTE — Telephone Encounter (Signed)
It is okay to hold aspirin. 

## 2018-09-04 NOTE — Telephone Encounter (Signed)
   Primary Cardiologist: Sinclair Grooms, MD  Chart reviewed as part of pre-operative protocol coverage. Patient was contacted 09/04/2018 in reference to pre-operative risk assessment for pending surgery as outlined below.  Mario [name] was last seen on 12/25/17 by [name] NP.  Since that day, Mario [name] has done well.  He denies anginal symptoms and continue his normal activities.  He does have some toe pain and leg pain but he denies exertional chest pain or shortness of breath.  Per Dr. Tamala Julian: OK to hold ASA prior to procedure.   Therefore, based on ACC/AHA guidelines, the patient would be at acceptable risk for the planned procedure without further cardiovascular testing.   I will route this recommendation to the requesting party via Epic fax function and remove from pre-op pool.  Please call with questions.  Tami Lin Sathvika Ojo, PA 09/04/2018, 9:26 AM

## 2018-09-04 NOTE — Telephone Encounter (Signed)
Dr. Tamala Julian, can you please comment on holding aspirin as requested by the surgeon?

## 2018-09-05 ENCOUNTER — Telehealth (HOSPITAL_COMMUNITY): Payer: Self-pay | Admitting: Rehabilitation

## 2018-09-05 NOTE — Telephone Encounter (Signed)
The above patient or their representative was contacted and gave the following answers to these questions:         Do you have any of the following symptoms? No  Fever                    Cough                   Shortness of breath  Do  you have any of the following other symptoms? No   muscle pain         vomiting,        diarrhea        rash         weakness        red eye        abdominal pain         bruising          bruising or bleeding              joint pain           severe headache    Have you been in contact with someone who was or has been sick in the past 2 weeks? No  Yes                 Unsure                         Unable to assess   Does the person that you were in contact with have any of the following symptoms? n/a  Cough         shortness of breath           muscle pain         vomiting,            diarrhea            rash            weakness           fever            red eye           abdominal pain           bruising  or  bleeding                joint pain                severe headache               Have you  or someone you have been in contact with traveled internationally in th last month? No         If yes, which countries? n/a   Have you  or someone you have been in contact with traveled outside [place] in th last month? No        If yes, which state and city? n/a   COMMENTS OR ACTION PLAN FOR THIS PATIENT:        q

## 2018-09-06 ENCOUNTER — Ambulatory Visit (INDEPENDENT_AMBULATORY_CARE_PROVIDER_SITE_OTHER): Payer: PPO | Admitting: Family

## 2018-09-06 ENCOUNTER — Ambulatory Visit (HOSPITAL_COMMUNITY)
Admission: RE | Admit: 2018-09-06 | Discharge: 2018-09-06 | Disposition: A | Discharge status: Home / Self Care | Payer: PPO | Source: Ambulatory Visit | Attending: Family | Admitting: Family

## 2018-09-06 ENCOUNTER — Encounter: Payer: Self-pay | Admitting: *Deleted

## 2018-09-06 ENCOUNTER — Other Ambulatory Visit: Payer: Self-pay | Admitting: *Deleted

## 2018-09-06 ENCOUNTER — Other Ambulatory Visit: Payer: Self-pay

## 2018-09-06 ENCOUNTER — Encounter: Payer: Self-pay | Admitting: Family

## 2018-09-06 VITALS — BP 122/75 | HR 88 | Temp 97.9°F | Resp 16 | Ht 74.0 in | Wt 161.3 lb

## 2018-09-06 DIAGNOSIS — L97523 Non-pressure chronic ulcer of other part of left foot with necrosis of muscle: Secondary | ICD-10-CM

## 2018-09-06 DIAGNOSIS — I779 Disorder of arteries and arterioles, unspecified: Secondary | ICD-10-CM | POA: Insufficient documentation

## 2018-09-06 DIAGNOSIS — R590 Localized enlarged lymph nodes: Secondary | ICD-10-CM | POA: Diagnosis not present

## 2018-09-06 DIAGNOSIS — Z5321 Procedure and treatment not carried out due to patient leaving prior to being seen by health care provider: Secondary | ICD-10-CM | POA: Diagnosis not present

## 2018-09-06 DIAGNOSIS — I999 Unspecified disorder of circulatory system: Secondary | ICD-10-CM | POA: Diagnosis not present

## 2018-09-06 DIAGNOSIS — M79672 Pain in left foot: Secondary | ICD-10-CM

## 2018-09-06 DIAGNOSIS — I998 Other disorder of circulatory system: Secondary | ICD-10-CM

## 2018-09-06 MED ORDER — TRAMADOL HCL 50 MG PO TABS: 50.0000 mg | ORAL_TABLET | Freq: Four times a day (QID) | ORAL | 0 refills | Status: DC | PRN

## 2018-09-06 NOTE — H&P (View-Only) (Signed)
[facility] [place]   CC: Left 2nd toe ulcer, left foot ischemic pain, peripheral artery occlusive disease, right groin lymph node cancer  History of Present Illness Mario [name] is a 78 y.o. male who is s/pangiogramof RLEwith attempted intervention on 12-28-16 by Mario [name].  He had a wound on the right medial malleolus that was slow to heal and biopsyhadproven basal cell carinoma.  Mario Octave RN received call on 08-30-18 from Mario [name], [place] at [place] regarding this patient's decreased blood flow in his left foot. They evidently did some kind of an ABI and this showed "No blood flow", Mario [name] not able to see the actual results in the Care Everywhere section of Epic. The patient last saw me on 04-2018 for his protocol visit and was to return in June. Will bring this patient in next week for repeat ABIs and to see Mario [name] for follow up on this problem. He has had a left toe ulcer x 6 months according to Mario [name]'s note from 08-30-18.    Mario [name] last evaluated pt on 04-28-17. At that timewound on right medial malleolus. ThiswasBCCby biopsy and will need Mario [name] surgery. Hopefully will heal without issues. If he fails to heal wound would need SFAor fem-pop bypass with either small saphenous or arm vein.Pt was to returnin 6 months unless wound cannot heal.  He had the Mario [name] procedure to right medial malleolus in February or March 2019; skin graft was placed over medial malleolus, this has healed. Mario [name] is his dermatologist, Dr. [name].  Pt has not been able to play pickel ball for about 2 weeks since his gym closed due to the [place] 19 pandemic. He has also not been able to play due to left foot pain for about 3+ weeks, states pain is not worsening, but does wake him up at night. For the last 3 weeks he has been taking Advil 400 mg every 4-6 hours, and occassionally has taken some of his wife's oxycodone (she is taking since recent hemorrhoid  surgery).   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   He denies tingling, numbness, cold sensation in either upper extremity.   He has a hx of gout in the left great toe.   He denies chest pain or dyspnea.   He denies any history of stroke. He had a CABG in 2016, quit smoking then.  Diabetic: Yes, A1C was 7.1 on 08-13-18, serum creatinine 1.49 on this date, GFR 45.46  Tobacco LKG:MWNUUV smoker, quitin 2016, smoked x 50years   Pt meds include: Statin :Yes Betablocker:Yes ASA:Yes Other anticoagulants/antiplatelets:states he is allergic to one blood thinner, plavix and pletal listed in his allergy list    Past Medical History:  Diagnosis Date  . Arthritis   . CAD (coronary artery disease)    a.  s/p IMI and BMS 1997;  b. Myoview 4/16:  inf scar with peri-infarct ischemia, EF 34%; high risk;  c. LHC 5/16:  3 v CAD >> CABG (free L-LAD, S-OM, S-dRCA)  . Carotid stenosis    a. carotid US 6/16:  bilat ICA 1-39%  . Chronic kidney disease, stage II (mild)   . Chronic systolic CHF (congestive heart failure) ([place])   . Essential hypertension, benign   . Gout   . HLD (hyperlipidemia)   . Ischemic cardiomyopathy    a. EF by myoview 4/16 34%; b. LHC 5/16: EF  40-45%;  c. intraop TEE 6/16: EF 45-50%  . Orthostasis   . PAD (peripheral artery disease) (HCC)    Mario [name]  . Pneumonia    hx of  . Type II or unspecified type diabetes mellitus without mention of complication, uncontrolled     Social History Social History   Tobacco Use  . Smoking status: Former Smoker    Packs/day: 0.50    Years: 50.00    Pack years: 25.00    Types: Cigarettes    Last attempt to quit: 10/22/2014    Years since quitting: 3.8  . Smokeless tobacco: Never Used  Substance Use Topics  . Alcohol use: No    Alcohol/week: 0.0 standard drinks  . Drug use: No    Family History Family History   Problem Relation Age of Onset  . Colon cancer Father   . Heart disease Mother   . Non-Hodgkin's lymphoma Daughter     Past Surgical History:  Procedure Laterality Date  . ABDOMINAL AORTAGRAM N/A 10/09/2013   Procedure: ABDOMINAL Maxcine Ham;  Surgeon: Wellington Hampshire, MD;  Location: [facility];  Service: Cardiovascular;  Laterality: N/A;  . ABDOMINAL AORTOGRAM N/A 12/28/2016   Procedure: ABDOMINAL AORTOGRAM;  Surgeon: Waynetta Sandy, MD;  Location: [facility];  Service: Cardiovascular;  Laterality: N/A;  . CARDIAC CATHETERIZATION N/A 10/02/2014   Procedure: Left Heart Cath and Coronary Angiography;  Surgeon: Belva Crome, MD;  Location: [facility];  Service: Cardiovascular;  Laterality: N/A;  . CATARACT EXTRACTION    . CHOLECYSTECTOMY    . COLONOSCOPY    . CORONARY ARTERY BYPASS GRAFT N/A 11/03/2014   Procedure: CORONARY ARTERY BYPASS GRAFTING  times three using left internal mammary and right greater saphenous vein;  Surgeon: Gaye Pollack, MD;  Location: [facility];  Service: Open Heart Surgery;  Laterality: N/A;  . HERNIA REPAIR    . LOWER EXTREMITY ANGIOGRAPHY Bilateral 12/28/2016   Procedure: Lower Extremity Angiography;  Surgeon: Waynetta Sandy, MD;  Location: [facility];  Service: Cardiovascular;  Laterality: Bilateral;  . MELANOMA EXCISION    . PERIPHERAL VASCULAR BALLOON ANGIOPLASTY Right 12/28/2016   Procedure: PERIPHERAL VASCULAR BALLOON ANGIOPLASTY;  Surgeon: Waynetta Sandy, MD;  Location: [facility];  Service: Cardiovascular;  Laterality: Right;  SFA UNSUCCESSFUL PTA  UNABLE TO CROSS LESION  . SKIN LESION EXCISION     multiple  . TEE WITHOUT CARDIOVERSION N/A 11/03/2014   Procedure: TRANSESOPHAGEAL ECHOCARDIOGRAM (TEE);  Surgeon: Gaye Pollack, MD;  Location: [facility];  Service: Open Heart Surgery;  Laterality: N/A;    Allergies  Allergen Reactions  . Penicillins Anaphylaxis    Has patient had a PCN reaction causing  immediate rash, facial/tongue/throat swelling, SOB or lightheadedness with hypotension: Yes Has patient had a PCN reaction causing severe rash involving mucus membranes or skin necrosis: No Has patient had a PCN reaction that required hospitalization: Yes Has patient had a PCN reaction occurring within the last 10 years: Unknown If all of the above answers are "NO", then may proceed with Cephalosporin use.   Marland Kitchen Plavix [Clopidogrel Bisulfate] Hives  . Pletal [Cilostazol] Itching    Current Outpatient Medications  Medication Sig Dispense Refill  . acetaminophen (TYLENOL) 500 MG tablet Take 1,000 mg by mouth daily as needed for moderate pain.    Marland Kitchen allopurinol (ZYLOPRIM) 100 MG tablet Take 1 tablet by mouth daily 90 tablet 0  . ARTIFICIAL TEAR OP Apply 1 drop to eye  daily as needed (dry eyes).    Marland Kitchen aspirin EC 81 MG tablet Take 81 mg by mouth daily.    Marland Kitchen atorvastatin (LIPITOR) 40 MG tablet take 1 tablet by mouth daily at 6:00pm  (Patient taking differently: Take 40 mg by mouth daily. ) 90 tablet 0  . Blood Glucose Monitoring Suppl (FREESTYLE LITE) DEVI USE TO TEST BLOOD SUGAR TWICE DAILY 1 each 1  . calcium carbonate (TUMS - DOSED IN MG ELEMENTAL CALCIUM) 500 MG chewable tablet Chew 1 tablet by mouth daily as needed for indigestion or heartburn.    . carvedilol (COREG) 3.125 MG tablet TAKE 1 TABLET BY MOUTH TWICE A DAY (Patient taking differently: Take 3.125 mg by mouth 2 (two) times daily with a meal. ) 180 tablet 3  . Cholecalciferol (VITAMIN D) 2000 units CAPS Take 2,000 Units by mouth daily.    . Coenzyme Q10 (COQ10) 200 MG CAPS Take 200 mg by mouth daily.    . fenofibrate micronized (ANTARA) 43 MG capsule Take 43 mg by mouth every evening.    . ferrous sulfate 325 (65 FE) MG tablet Take 325 mg by mouth daily.    . finasteride (PROSCAR) 5 MG tablet Take 5 mg by mouth every evening.   3  . folic acid (FOLVITE) 235 MCG tablet Take 400 mcg by mouth daily.    Marland Kitchen glipiZIDE (GLUCOTROL XL) 10 MG 24  hr tablet TAKE 1 TABLET BY MOUTH AT BEDTIME FOR DIABETES. (Patient taking differently: Take 10 mg by mouth at bedtime. ) 30 tablet 2  . glucose blood (FREESTYLE LITE) test strip Use as instructed to check blood sugar 2 times per day dx code E11.65 100 each 3  . Homeopathic Products (LEG CRAMP RELIEF) TABS Take 1 tablet by mouth daily as needed (leg cramps).    . hydrocortisone cream 1 % Apply 1 application topically daily as needed (irritation).    . Lancets (FREESTYLE) lancets Use as instructed to check blood sugar 2 times per day dx code E11.65 100 each 3  . lisinopril (PRINIVIL,ZESTRIL) 5 MG tablet Take 1 tablet (5 mg total) by mouth daily. 90 tablet 3  . metFORMIN (GLUCOPHAGE) 1000 MG tablet TAKE 1 TABLET BY MOUTH TWICE DAILY FOR BREAKFAST AND DINNER FOR DIABETES. (Patient taking differently: Take 1,000 mg by mouth 2 (two) times daily with a meal. ) 60 tablet 2  . miglitol (GLYSET) 50 MG tablet TAKE 1 TABLET BY MOUTH TWICE DAILY AT BREAKFAST AND DINNER FOR DIABETES. (Patient taking differently: Take 50 mg by mouth 2 (two) times daily. ) 60 tablet 2  . multivitamin-lutein (OCUVITE-LUTEIN) CAPS capsule Take 1 capsule by mouth 2 (two) times daily.    . tamsulosin (FLOMAX) 0.4 MG CAPS capsule Take 0.4 mg by mouth 2 (two) times daily.    Marland Kitchen VICTOZA 18 MG/3ML SOPN INJECT 1.8MG  INTO THE SKIN DAILY AT DINNERTIME FOR DIABETES. (Patient taking differently: Inject 1.8 mg into the skin daily with supper. ) 9 mL 2  . vitamin B-12 (CYANOCOBALAMIN) 1000 MCG tablet Take 1,000 mcg by mouth daily.     No current facility-administered medications for this visit.     ROS: See HPI for pertinent positives and negatives.   Physical Examination  Vitals:   09/06/18 1027 09/06/18 1032  BP: 133/76 122/75  Pulse: 88   Resp: 16   Temp: 97.9 F (36.6 C)   TempSrc: Oral   SpO2: 100%   Weight: 161 lb 4.8 oz (73.2 kg)   Height: 6\' 2"  (  1.88 m)    Body mass index is 20.71 kg/m.  General: A&O x 3, WDWN, slim  elderly male. Gait: limp HENT: No gross abnormalities. Hearing aids in place.  Eyes: PERRLA. Pulmonary: Respirations are non labored, CTAB, good air movement in all fields Cardiac: regular rhythm, no detected murmur.         Carotid Bruits Right Left   Positive Negative   Radial pulses are 2+ palpable bilaterally   Adominal aortic pulse is not palpable                         VASCULAR EXAM: Extremities with ischemic changes at right 2nd toe, without Gangrene; with open wound,at tip of left 2nd toe, see photos below. Moveable mass in right groin, with irregular edges, measures about 2 cm x 2 cm.                                                                                                                     LE Pulses Right Left       FEMORAL  2+ palpable  2+ palpable        POPLITEAL  not palpable   not palpable       POSTERIOR TIBIAL  not palpable   not palpable        DORSALIS PEDIS      ANTERIOR TIBIAL not palpable  not palpable    Abdomen: soft, NT, no palpable masses. Skin: no rashes, no cellulitis, see Extremities Musculoskeletal: no muscle wasting or atrophy.  Neurologic: A&O X 3; appropriate affect, Sensation is normal; MOTOR FUNCTION:  moving all extremities equally, motor strength 5/5 throughout. Speech is fluent/normal. CN 2-12 intact. Psychiatric: Thought content is normal, mood appropriate for clinical situation.    ASSESSMENT: DAVEON ARPINO is a 78 y.o. male whom Mario [name] evaluated for a lesion on pt right medial malleolus, which biopsy demonstrated as BCC. Pt had a Mario [name] procedure to this, with a skin graft afterward. This has healed.  He has developed a dry ulcer at his left second toe tip, left TBI has declined to 0, both ABI;s indicate non compressible vessels, monophasic waveforms on the right, dampened monophasic in the left. See Plan.  On 08-13-18, serum creatinine 1.49 on this date, GFR 45.46  Pt had been taking NSAID's every 4-6 hours for  the last couple of weeks to help his left foot pain, I advised him to stop the NSAID's, tramadol prescribed.    He had the mass in his right groin evaluated, states needle bx showed squamous cell cancer, is scheduled on 09-19-18 for right groin lymph node dissection.   He has a left carotid bruit not noted at a previous visit, he denies any known hx of stroke or TIA. Last carotid duplex on file was in 2014, see results below, with 50-69% left ICA stenosis. Will need to recheck carotid duplex at some point soon.     DATA  ABI (Date: 09/06/2018): +---------+------------------+-----+----------+--------+  Right    Rt Pressure (mmHg)IndexWaveform  Comment  +---------+------------------+-----+----------+--------+ Brachial 165                                       +---------+------------------+-----+----------+--------+ ATA      255               1.55 monophasic         +---------+------------------+-----+----------+--------+ PTA      255               1.55 monophasic         +---------+------------------+-----+----------+--------+ Great Toe79                0.48 Abnormal           +---------+------------------+-----+----------+--------+  +---------+------------------+-----+-------------------+-------+ Left     Lt Pressure (mmHg)IndexWaveform           Comment +---------+------------------+-----+-------------------+-------+ Brachial 157                                               +---------+------------------+-----+-------------------+-------+ ATA      255               1.55 dampened monophasic        +---------+------------------+-----+-------------------+-------+ PTA      255               1.55 dampened monophasic        +---------+------------------+-----+-------------------+-------+ Great Toe0                 0.00 Abnormal                   +---------+------------------+-----+-------------------+-------+   +-------+----------------+-----------+----------------+------------+ ABI/TBIToday's ABI     Today's TBIPrevious ABI    Previous TBI +-------+----------------+-----------+----------------+------------+ Right  Non compressible0.48       Non compressible0.44         +-------+----------------+-----------+----------------+------------+ Left   Non compressible0          Non compressible0.32         +-------+----------------+-----------+----------------+------------+ Arterial wall calcification precludes accurate ankle pressures and ABIs. Bilateral ABIs appear essentially unchanged compared to prior study on 11/07/2017.   Summary: Right: Resting right ankle-brachial index indicates noncompressible right lower extremity arteries. RT great toe pressure = 79 mmHg.  Left: Resting left ankle-brachial index indicates noncompressible left lower extremity arteries. LT Great toe pressure = 0 mmHg.    Carotid Duplex 07-20-12: Moderate atherosclerotic plaque at both carotid bifurcations with estimated 50 - 69% left ICA stenosis and less than 50% right ICA Stenosis.  Aortogram 12-28-16: Findings:The aorta iliac and common femoral arteries are heavily calcified bilaterally. The left common femoral artery has a subtotal occlusion in the right common femoral artery has at least 50% stenosis. The right lower extremity the SFA occludes at its extreme distal point in the adductor canal and reconstitutes a popliteal artery at the knee with dominant runoff to the foot via the anterior tibial artery. The left lower extremity has occlusion of the popliteal artery that a short segment and reconstituted at the knee with also dominant runoff via the anterior tibial artery. We were unable to cross the heavily calcified area in the right popliteal artery and no intervention was undertaken.     PLAN:  Based on the patient's vascular studies  and examination, and after Dr. Oneida Alar spoke with and examined  pt, pt will be scheduled for arteriogram ASAP, possible intervention left LE, soonest available vascular surgeon, Mario [name] has intervened in the past.   Left foot pain, wakes him at night: Tramadol 50 mg po q6h prn pain, disp #20, 0 refills, written prescription given.   Clemon Chambers, RN, MSN, FNP-C [facility] Phone: [phone]  Clinic MD: [facility]  09/06/18 10:55 AM

## 2018-09-06 NOTE — Progress Notes (Signed)
[facility] [place]   CC: Left 2nd toe ulcer, left foot ischemic pain, peripheral artery occlusive disease, right groin lymph node cancer  History of Present Illness Mario [name] is a 78 y.o. male who is s/pangiogramof RLEwith attempted intervention on 12-28-16 by Dr. [name].  He had a wound on the right medial malleolus that was slow to heal and biopsyhadproven basal cell carinoma.  Lezlie Octave RN received call on 08-30-18 from Iris Pert, [place] [facility] [place] regarding this patient's decreased blood flow in his left foot. They evidently did some kind of an ABI and this showed "No blood flow", Zigmund Daniel not able to see the actual results in the Care Everywhere section of Epic. The patient last saw me on 04-2018 for his protocol visit and was to return in June. Will bring this patient in next week for repeat ABIs and to see [name] for follow up on this problem. He has had a left toe ulcer x 6 months according to Rachel's note from 08-30-18.    Dr. [name] last evaluated pt on 04-28-17. At that timewound on right medial malleolus. ThiswasBCCby biopsy and will need Moh's surgery. Hopefully will heal without issues. If he fails to heal wound would need SFAor fem-pop bypass with either small saphenous or arm vein.Pt was to returnin 6 months unless wound cannot heal.  He had the Moh's procedure to right medial malleolus in February or March 2019; skin graft was placed over medial malleolus, this has healed. [facility] is his dermatologist, Dr. [name].  Pt has not been able to play pickel ball for about 2 weeks since his gym closed due to the [place] 19 pandemic. He has also not been able to play due to left foot pain for about 3+ weeks, states pain is not worsening, but does wake him up at night. For the last 3 weeks he has been taking Advil 400 mg every 4-6 hours, and occassionally has taken some of his wife's oxycodone (she is taking since recent hemorrhoid  surgery).   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   He denies tingling, numbness, cold sensation in either upper extremity.   He has a hx of gout in the left great toe.   He denies chest pain or dyspnea.   He denies any history of stroke. He had a CABG in 2016, quit smoking then.  Diabetic: Yes, A1C was 7.1 on 08-13-18, serum creatinine 1.49 on this date, GFR 45.46  Tobacco YTK:ZSWFUX smoker, quitin 2016, smoked x 50years   Pt meds include: Statin :Yes Betablocker:Yes ASA:Yes Other anticoagulants/antiplatelets:states he is allergic to one blood thinner, plavix and pletal listed in his allergy list    Past Medical History:  Diagnosis Date  . Arthritis   . CAD (coronary artery disease)    a.  s/p IMI and BMS 1997;  b. Myoview 4/16:  inf scar with peri-infarct ischemia, EF 34%; high risk;  c. LHC 5/16:  3 v CAD >> CABG (free L-LAD, S-OM, S-dRCA)  . Carotid stenosis    a. carotid US 6/16:  bilat ICA 1-39%  . Chronic kidney disease, stage II (mild)   . Chronic systolic CHF (congestive heart failure) ([place])   . Essential hypertension, benign   . Gout   . HLD (hyperlipidemia)   . Ischemic cardiomyopathy    a. EF by myoview 4/16 34%; b. LHC 5/16: EF  40-45%;  c. intraop TEE 6/16: EF 45-50%  . Orthostasis   . PAD (peripheral artery disease) ([facility])    Dr. Fletcher Anon  . Pneumonia    hx of  . Type II or unspecified type diabetes mellitus without mention of complication, uncontrolled     Social History Social History   Tobacco Use  . Smoking status: Former Smoker    Packs/day: 0.50    Years: 50.00    Pack years: 25.00    Types: Cigarettes    Last attempt to quit: 10/22/2014    Years since quitting: 3.8  . Smokeless tobacco: Never Used  Substance Use Topics  . Alcohol use: No    Alcohol/week: 0.0 standard drinks  . Drug use: No    Family History Family History   Problem Relation Age of Onset  . Colon cancer Father   . Heart disease Mother   . Non-Hodgkin's lymphoma Daughter     Past Surgical History:  Procedure Laterality Date  . ABDOMINAL AORTAGRAM N/A 10/09/2013   Procedure: ABDOMINAL Maxcine Ham;  Surgeon: Wellington Hampshire, MD;  Location: [facility];  Service: Cardiovascular;  Laterality: N/A;  . ABDOMINAL AORTOGRAM N/A 12/28/2016   Procedure: ABDOMINAL AORTOGRAM;  Surgeon: Waynetta Sandy, MD;  Location: [facility];  Service: Cardiovascular;  Laterality: N/A;  . CARDIAC CATHETERIZATION N/A 10/02/2014   Procedure: Left Heart Cath and Coronary Angiography;  Surgeon: Belva Crome, MD;  Location: [facility];  Service: Cardiovascular;  Laterality: N/A;  . CATARACT EXTRACTION    . CHOLECYSTECTOMY    . COLONOSCOPY    . CORONARY ARTERY BYPASS GRAFT N/A 11/03/2014   Procedure: CORONARY ARTERY BYPASS GRAFTING  times three using left internal mammary and right greater saphenous vein;  Surgeon: Gaye Pollack, MD;  Location: [facility];  Service: Open Heart Surgery;  Laterality: N/A;  . HERNIA REPAIR    . LOWER EXTREMITY ANGIOGRAPHY Bilateral 12/28/2016   Procedure: Lower Extremity Angiography;  Surgeon: Waynetta Sandy, MD;  Location: [facility];  Service: Cardiovascular;  Laterality: Bilateral;  . MELANOMA EXCISION    . PERIPHERAL VASCULAR BALLOON ANGIOPLASTY Right 12/28/2016   Procedure: PERIPHERAL VASCULAR BALLOON ANGIOPLASTY;  Surgeon: Waynetta Sandy, MD;  Location: [facility];  Service: Cardiovascular;  Laterality: Right;  SFA UNSUCCESSFUL PTA  UNABLE TO CROSS LESION  . SKIN LESION EXCISION     multiple  . TEE WITHOUT CARDIOVERSION N/A 11/03/2014   Procedure: TRANSESOPHAGEAL ECHOCARDIOGRAM (TEE);  Surgeon: Gaye Pollack, MD;  Location: [facility];  Service: Open Heart Surgery;  Laterality: N/A;    Allergies  Allergen Reactions  . Penicillins Anaphylaxis    Has patient had a PCN reaction causing  immediate rash, facial/tongue/throat swelling, SOB or lightheadedness with hypotension: Yes Has patient had a PCN reaction causing severe rash involving mucus membranes or skin necrosis: No Has patient had a PCN reaction that required hospitalization: Yes Has patient had a PCN reaction occurring within the last 10 years: Unknown If all of the above answers are "NO", then may proceed with Cephalosporin use.   Marland Kitchen Plavix [Clopidogrel Bisulfate] Hives  . Pletal [Cilostazol] Itching    Current Outpatient Medications  Medication Sig Dispense Refill  . acetaminophen (TYLENOL) 500 MG tablet Take 1,000 mg by mouth daily as needed for moderate pain.    Marland Kitchen allopurinol (ZYLOPRIM) 100 MG tablet Take 1 tablet by mouth daily 90 tablet 0  . ARTIFICIAL TEAR OP Apply 1 drop to eye  daily as needed (dry eyes).    Marland Kitchen aspirin EC 81 MG tablet Take 81 mg by mouth daily.    Marland Kitchen atorvastatin (LIPITOR) 40 MG tablet take 1 tablet by mouth daily at 6:00pm  (Patient taking differently: Take 40 mg by mouth daily. ) 90 tablet 0  . Blood Glucose Monitoring Suppl (FREESTYLE LITE) DEVI USE TO TEST BLOOD SUGAR TWICE DAILY 1 each 1  . calcium carbonate (TUMS - DOSED IN MG ELEMENTAL CALCIUM) 500 MG chewable tablet Chew 1 tablet by mouth daily as needed for indigestion or heartburn.    . carvedilol (COREG) 3.125 MG tablet TAKE 1 TABLET BY MOUTH TWICE A DAY (Patient taking differently: Take 3.125 mg by mouth 2 (two) times daily with a meal. ) 180 tablet 3  . Cholecalciferol (VITAMIN D) 2000 units CAPS Take 2,000 Units by mouth daily.    . Coenzyme Q10 (COQ10) 200 MG CAPS Take 200 mg by mouth daily.    . fenofibrate micronized (ANTARA) 43 MG capsule Take 43 mg by mouth every evening.    . ferrous sulfate 325 (65 FE) MG tablet Take 325 mg by mouth daily.    . finasteride (PROSCAR) 5 MG tablet Take 5 mg by mouth every evening.   3  . folic acid (FOLVITE) 347 MCG tablet Take 400 mcg by mouth daily.    Marland Kitchen glipiZIDE (GLUCOTROL XL) 10 MG 24  hr tablet TAKE 1 TABLET BY MOUTH AT BEDTIME FOR DIABETES. (Patient taking differently: Take 10 mg by mouth at bedtime. ) 30 tablet 2  . glucose blood (FREESTYLE LITE) test strip Use as instructed to check blood sugar 2 times per day dx code E11.65 100 each 3  . Homeopathic Products (LEG CRAMP RELIEF) TABS Take 1 tablet by mouth daily as needed (leg cramps).    . hydrocortisone cream 1 % Apply 1 application topically daily as needed (irritation).    . Lancets (FREESTYLE) lancets Use as instructed to check blood sugar 2 times per day dx code E11.65 100 each 3  . lisinopril (PRINIVIL,ZESTRIL) 5 MG tablet Take 1 tablet (5 mg total) by mouth daily. 90 tablet 3  . metFORMIN (GLUCOPHAGE) 1000 MG tablet TAKE 1 TABLET BY MOUTH TWICE DAILY FOR BREAKFAST AND DINNER FOR DIABETES. (Patient taking differently: Take 1,000 mg by mouth 2 (two) times daily with a meal. ) 60 tablet 2  . miglitol (GLYSET) 50 MG tablet TAKE 1 TABLET BY MOUTH TWICE DAILY AT BREAKFAST AND DINNER FOR DIABETES. (Patient taking differently: Take 50 mg by mouth 2 (two) times daily. ) 60 tablet 2  . multivitamin-lutein (OCUVITE-LUTEIN) CAPS capsule Take 1 capsule by mouth 2 (two) times daily.    . tamsulosin (FLOMAX) 0.4 MG CAPS capsule Take 0.4 mg by mouth 2 (two) times daily.    Marland Kitchen VICTOZA 18 MG/3ML SOPN INJECT 1.8MG  INTO THE SKIN DAILY AT DINNERTIME FOR DIABETES. (Patient taking differently: Inject 1.8 mg into the skin daily with supper. ) 9 mL 2  . vitamin B-12 (CYANOCOBALAMIN) 1000 MCG tablet Take 1,000 mcg by mouth daily.     No current facility-administered medications for this visit.     ROS: See HPI for pertinent positives and negatives.   Physical Examination  Vitals:   09/06/18 1027 09/06/18 1032  BP: 133/76 122/75  Pulse: 88   Resp: 16   Temp: 97.9 F (36.6 C)   TempSrc: Oral   SpO2: 100%   Weight: 161 lb 4.8 oz (73.2 kg)   Height: 6\' 2"  (  1.88 m)    Body mass index is 20.71 kg/m.  General: A&O x 3, WDWN, slim  elderly male. Gait: limp HENT: No gross abnormalities. Hearing aids in place.  Eyes: PERRLA. Pulmonary: Respirations are non labored, CTAB, good air movement in all fields Cardiac: regular rhythm, no detected murmur.         Carotid Bruits Right Left   Positive Negative   Radial pulses are 2+ palpable bilaterally   Adominal aortic pulse is not palpable                         VASCULAR EXAM: Extremities with ischemic changes at right 2nd toe, without Gangrene; with open wound,at tip of left 2nd toe, see photos below. Moveable mass in right groin, with irregular edges, measures about 2 cm x 2 cm.                                                                                                                     LE Pulses Right Left       FEMORAL  2+ palpable  2+ palpable        POPLITEAL  not palpable   not palpable       POSTERIOR TIBIAL  not palpable   not palpable        DORSALIS PEDIS      ANTERIOR TIBIAL not palpable  not palpable    Abdomen: soft, NT, no palpable masses. Skin: no rashes, no cellulitis, see Extremities Musculoskeletal: no muscle wasting or atrophy.  Neurologic: A&O X 3; appropriate affect, Sensation is normal; MOTOR FUNCTION:  moving all extremities equally, motor strength 5/5 throughout. Speech is fluent/normal. CN 2-12 intact. Psychiatric: Thought content is normal, mood appropriate for clinical situation.    ASSESSMENT: [name] is a 78 y.o. male whom Dr. [name] evaluated for a lesion on pt right medial malleolus, which biopsy demonstrated as BCC. Pt had a Moh's procedure to this, with a skin graft afterward. This has healed.  He has developed a dry ulcer at his left second toe tip, left TBI has declined to 0, both ABI;s indicate non compressible vessels, monophasic waveforms on the right, dampened monophasic in the left. See Plan.  On 08-13-18, serum creatinine 1.49 on this date, GFR 45.46  Pt had been taking NSAID's every 4-6 hours for  the last couple of weeks to help his left foot pain, I advised him to stop the NSAID's, tramadol prescribed.    He had the mass in his right groin evaluated, states needle bx showed squamous cell cancer, is scheduled on 09-19-18 for right groin lymph node dissection.   He has a left carotid bruit not noted at a previous visit, he denies any known hx of stroke or TIA. Last carotid duplex on file was in 2014, see results below, with 50-69% left ICA stenosis. Will need to recheck carotid duplex at some point soon.     DATA  ABI (Date: 09/06/2018): +---------+------------------+-----+----------+--------+  Right    Rt Pressure (mmHg)IndexWaveform  Comment  +---------+------------------+-----+----------+--------+ Brachial 165                                       +---------+------------------+-----+----------+--------+ ATA      255               1.55 monophasic         +---------+------------------+-----+----------+--------+ PTA      255               1.55 monophasic         +---------+------------------+-----+----------+--------+ Great Toe79                0.48 Abnormal           +---------+------------------+-----+----------+--------+  +---------+------------------+-----+-------------------+-------+ Left     Lt Pressure (mmHg)IndexWaveform           Comment +---------+------------------+-----+-------------------+-------+ Brachial 157                                               +---------+------------------+-----+-------------------+-------+ ATA      255               1.55 dampened monophasic        +---------+------------------+-----+-------------------+-------+ PTA      255               1.55 dampened monophasic        +---------+------------------+-----+-------------------+-------+ Great Toe0                 0.00 Abnormal                   +---------+------------------+-----+-------------------+-------+   +-------+----------------+-----------+----------------+------------+ ABI/TBIToday's ABI     Today's TBIPrevious ABI    Previous TBI +-------+----------------+-----------+----------------+------------+ Right  Non compressible0.48       Non compressible0.44         +-------+----------------+-----------+----------------+------------+ Left   Non compressible0          Non compressible0.32         +-------+----------------+-----------+----------------+------------+ Arterial wall calcification precludes accurate ankle pressures and ABIs. Bilateral ABIs appear essentially unchanged compared to prior study on 11/07/2017.   Summary: Right: Resting right ankle-brachial index indicates noncompressible right lower extremity arteries. RT great toe pressure = 79 mmHg.  Left: Resting left ankle-brachial index indicates noncompressible left lower extremity arteries. LT Great toe pressure = 0 mmHg.    Carotid Duplex 07-20-12: Moderate atherosclerotic plaque at both carotid bifurcations with estimated 50 - 69% left ICA stenosis and less than 50% right ICA Stenosis.  Aortogram 12-28-16: Findings:The aorta iliac and common femoral arteries are heavily calcified bilaterally. The left common femoral artery has a subtotal occlusion in the right common femoral artery has at least 50% stenosis. The right lower extremity the SFA occludes at its extreme distal point in the adductor canal and reconstitutes a popliteal artery at the knee with dominant runoff to the foot via the anterior tibial artery. The left lower extremity has occlusion of the popliteal artery that a short segment and reconstituted at the knee with also dominant runoff via the anterior tibial artery. We were unable to cross the heavily calcified area in the right popliteal artery and no intervention was undertaken.     PLAN:  Based on the patient's vascular studies  and examination, and after Dr. Oneida Alar spoke with and examined  pt, pt will be scheduled for arteriogram ASAP, possible intervention left LE, soonest available vascular surgeon, Dr. [name] has intervened in the past.   Left foot pain, wakes him at night: Tramadol 50 mg po q6h prn pain, disp #20, 0 refills, written prescription given.   Clemon Chambers, RN, MSN, FNP-C [facility] Phone: [phone]  Clinic MD: [facility]  09/06/18 10:55 AM

## 2018-09-10 ENCOUNTER — Other Ambulatory Visit: Payer: Self-pay | Admitting: Family

## 2018-09-11 ENCOUNTER — Ambulatory Visit (HOSPITAL_COMMUNITY)
Admission: RE | Admit: 2018-09-11 | Discharge: 2018-09-11 | Disposition: A | Discharge status: Home / Self Care | Payer: PPO | Attending: Surgery | Admitting: Surgery

## 2018-09-11 ENCOUNTER — Other Ambulatory Visit: Payer: Self-pay

## 2018-09-11 ENCOUNTER — Other Ambulatory Visit: Payer: Self-pay | Admitting: *Deleted

## 2018-09-11 ENCOUNTER — Encounter (HOSPITAL_COMMUNITY)
Admission: RE | Disposition: A | Discharge status: Home / Self Care | Payer: Self-pay | Source: Home / Self Care | Attending: Surgery

## 2018-09-11 DIAGNOSIS — I70245 Atherosclerosis of native arteries of left leg with ulceration of other part of foot: Secondary | ICD-10-CM | POA: Diagnosis not present

## 2018-09-11 DIAGNOSIS — I5022 Chronic systolic (congestive) heart failure: Secondary | ICD-10-CM | POA: Diagnosis not present

## 2018-09-11 DIAGNOSIS — Z87891 Personal history of nicotine dependence: Secondary | ICD-10-CM | POA: Insufficient documentation

## 2018-09-11 DIAGNOSIS — E782 Mixed hyperlipidemia: Secondary | ICD-10-CM | POA: Diagnosis not present

## 2018-09-11 DIAGNOSIS — I2510 Atherosclerotic heart disease of native coronary artery without angina pectoris: Secondary | ICD-10-CM | POA: Insufficient documentation

## 2018-09-11 DIAGNOSIS — E1122 Type 2 diabetes mellitus with diabetic chronic kidney disease: Secondary | ICD-10-CM | POA: Diagnosis not present

## 2018-09-11 DIAGNOSIS — I255 Ischemic cardiomyopathy: Secondary | ICD-10-CM | POA: Insufficient documentation

## 2018-09-11 DIAGNOSIS — Z7982 Long term (current) use of aspirin: Secondary | ICD-10-CM | POA: Insufficient documentation

## 2018-09-11 DIAGNOSIS — C969 Malignant neoplasm of lymphoid, hematopoietic and related tissue, unspecified: Secondary | ICD-10-CM | POA: Diagnosis not present

## 2018-09-11 DIAGNOSIS — M79672 Pain in left foot: Secondary | ICD-10-CM | POA: Diagnosis not present

## 2018-09-11 DIAGNOSIS — N182 Chronic kidney disease, stage 2 (mild): Secondary | ICD-10-CM | POA: Diagnosis not present

## 2018-09-11 DIAGNOSIS — N183 Chronic kidney disease, stage 3 (moderate): Secondary | ICD-10-CM | POA: Diagnosis not present

## 2018-09-11 DIAGNOSIS — Z7984 Long term (current) use of oral hypoglycemic drugs: Secondary | ICD-10-CM | POA: Diagnosis not present

## 2018-09-11 DIAGNOSIS — I130 Hypertensive heart and chronic kidney disease with heart failure and stage 1 through stage 4 chronic kidney disease, or unspecified chronic kidney disease: Secondary | ICD-10-CM | POA: Insufficient documentation

## 2018-09-11 DIAGNOSIS — I6523 Occlusion and stenosis of bilateral carotid arteries: Secondary | ICD-10-CM | POA: Diagnosis not present

## 2018-09-11 DIAGNOSIS — M109 Gout, unspecified: Secondary | ICD-10-CM | POA: Insufficient documentation

## 2018-09-11 DIAGNOSIS — Z79899 Other long term (current) drug therapy: Secondary | ICD-10-CM | POA: Insufficient documentation

## 2018-09-11 DIAGNOSIS — I10 Essential (primary) hypertension: Secondary | ICD-10-CM | POA: Diagnosis not present

## 2018-09-11 DIAGNOSIS — L97529 Non-pressure chronic ulcer of other part of left foot with unspecified severity: Secondary | ICD-10-CM | POA: Insufficient documentation

## 2018-09-11 DIAGNOSIS — Z794 Long term (current) use of insulin: Secondary | ICD-10-CM | POA: Diagnosis not present

## 2018-09-11 DIAGNOSIS — E1151 Type 2 diabetes mellitus with diabetic peripheral angiopathy without gangrene: Secondary | ICD-10-CM | POA: Diagnosis present

## 2018-09-11 DIAGNOSIS — E785 Hyperlipidemia, unspecified: Secondary | ICD-10-CM | POA: Diagnosis not present

## 2018-09-11 DIAGNOSIS — N401 Enlarged prostate with lower urinary tract symptoms: Secondary | ICD-10-CM | POA: Diagnosis not present

## 2018-09-11 HISTORY — PX: ABDOMINAL AORTOGRAM W/LOWER EXTREMITY: CATH118223

## 2018-09-11 LAB — GLUCOSE, CAPILLARY: Glucose-Capillary: 187 mg/dL — ABNORMAL HIGH (ref 70–99)

## 2018-09-11 LAB — POCT I-STAT 4, (NA,K, GLUC, HGB,HCT)
Glucose, Bld: 209 mg/dL — ABNORMAL HIGH (ref 70–99)
HCT: 29 % — ABNORMAL LOW (ref 39.0–52.0)
Hemoglobin: 9.9 g/dL — ABNORMAL LOW (ref 13.0–17.0)
Potassium: 4.5 mmol/L (ref 3.5–5.1)
Sodium: 138 mmol/L (ref 135–145)

## 2018-09-11 LAB — POCT I-STAT CREATININE: Creatinine, Ser: 1.3 mg/dL — ABNORMAL HIGH (ref 0.61–1.24)

## 2018-09-11 SURGERY — ABDOMINAL AORTOGRAM W/LOWER EXTREMITY
Anesthesia: LOCAL | Laterality: Bilateral

## 2018-09-11 MED ORDER — HEPARIN (PORCINE) IN NACL 1000-0.9 UT/500ML-% IV SOLN
INTRAVENOUS | Status: AC
  Filled 2018-09-11: qty 1000

## 2018-09-11 MED ORDER — HEPARIN (PORCINE) IN NACL 1000-0.9 UT/500ML-% IV SOLN
INTRAVENOUS | Status: DC | PRN
  Administered 2018-09-11 (×2): 500 mL

## 2018-09-11 MED ORDER — MIDAZOLAM HCL 2 MG/2ML IJ SOLN
INTRAMUSCULAR | Status: AC
  Filled 2018-09-11: qty 2

## 2018-09-11 MED ORDER — SODIUM CHLORIDE 0.9% FLUSH: 3.0000 mL | INTRAVENOUS | Status: DC | PRN

## 2018-09-11 MED ORDER — LIDOCAINE HCL (PF) 1 % IJ SOLN
INTRAMUSCULAR | Status: AC
  Filled 2018-09-11: qty 30

## 2018-09-11 MED ORDER — MORPHINE SULFATE (PF) 2 MG/ML IV SOLN: 2.0000 mg | INTRAVENOUS | Status: DC | PRN

## 2018-09-11 MED ORDER — OXYCODONE HCL 5 MG PO TABS: 5.0000 mg | ORAL_TABLET | ORAL | Status: DC | PRN

## 2018-09-11 MED ORDER — SODIUM CHLORIDE 0.9 % IV SOLN
INTRAVENOUS | Status: DC
  Administered 2018-09-11: 09:00:00 via INTRAVENOUS

## 2018-09-11 MED ORDER — FENTANYL CITRATE (PF) 100 MCG/2ML IJ SOLN
INTRAMUSCULAR | Status: DC | PRN
  Administered 2018-09-11: 25 ug via INTRAVENOUS
  Administered 2018-09-11: 50 ug via INTRAVENOUS

## 2018-09-11 MED ORDER — LIDOCAINE HCL (PF) 1 % IJ SOLN
INTRAMUSCULAR | Status: DC | PRN
  Administered 2018-09-11: 15 mL via INTRADERMAL

## 2018-09-11 MED ORDER — SODIUM CHLORIDE 0.9 % IV SOLN: 250.0000 mL | INTRAVENOUS | Status: DC | PRN

## 2018-09-11 MED ORDER — LABETALOL HCL 5 MG/ML IV SOLN: 10.0000 mg | INTRAVENOUS | Status: DC | PRN

## 2018-09-11 MED ORDER — IODIXANOL 320 MG/ML IV SOLN
INTRAVENOUS | Status: DC | PRN
  Administered 2018-09-11: 11:00:00 112 mL via INTRA_ARTERIAL

## 2018-09-11 MED ORDER — SODIUM CHLORIDE 0.9% FLUSH: 3.0000 mL | Freq: Two times a day (BID) | INTRAVENOUS | Status: DC

## 2018-09-11 MED ORDER — ACETAMINOPHEN 325 MG PO TABS: 650.0000 mg | ORAL_TABLET | ORAL | Status: DC | PRN

## 2018-09-11 MED ORDER — FENTANYL CITRATE (PF) 100 MCG/2ML IJ SOLN
INTRAMUSCULAR | Status: AC
  Filled 2018-09-11: qty 2

## 2018-09-11 MED ORDER — OXYCODONE-ACETAMINOPHEN 5-325 MG PO TABS: 1.0000 | ORAL_TABLET | ORAL | 0 refills | Status: DC | PRN

## 2018-09-11 MED ORDER — MIDAZOLAM HCL 2 MG/2ML IJ SOLN
INTRAMUSCULAR | Status: DC | PRN
  Administered 2018-09-11: 1 mg via INTRAVENOUS
  Administered 2018-09-11: 2 mg via INTRAVENOUS

## 2018-09-11 MED ORDER — ONDANSETRON HCL 4 MG/2ML IJ SOLN: 4.0000 mg | Freq: Four times a day (QID) | INTRAMUSCULAR | Status: DC | PRN

## 2018-09-11 MED ORDER — HYDRALAZINE HCL 20 MG/ML IJ SOLN: 5.0000 mg | INTRAMUSCULAR | Status: DC | PRN

## 2018-09-11 MED ORDER — SODIUM CHLORIDE 0.9 % WEIGHT BASED INFUSION: 1.0000 mL/kg/h | INTRAVENOUS | Status: DC

## 2018-09-11 SURGICAL SUPPLY — 12 items
CATH OMNI FLUSH 5F 65CM (CATHETERS) ×2 IMPLANT
CATH SOFT-VU 4F 65 STRAIGHT (CATHETERS) ×1 IMPLANT
CATH SOFT-VU STRAIGHT 4F 65CM (CATHETERS) ×1
CLOSURE MYNX CONTROL 5F (Vascular Products) ×2 IMPLANT
KIT MICROPUNCTURE NIT STIFF (SHEATH) ×2 IMPLANT
KIT PV (KITS) ×2 IMPLANT
SHEATH PINNACLE 5F 10CM (SHEATH) ×2 IMPLANT
SHEATH PROBE COVER 6X72 (BAG) ×2 IMPLANT
SYR MEDRAD MARK V 150ML (SYRINGE) ×2 IMPLANT
TRANSDUCER W/STOPCOCK (MISCELLANEOUS) ×2 IMPLANT
TRAY PV CATH (CUSTOM PROCEDURE TRAY) ×2 IMPLANT
WIRE BENTSON .035X145CM (WIRE) ×2 IMPLANT

## 2018-09-11 NOTE — Op Note (Signed)
    Patient name: [name] MRN: [id number] DOB: 1940-09-30 Sex: male  09/11/2018 Pre-operative Diagnosis: Left toe ulcer Post-operative diagnosis:  Same Surgeon:  Annamarie Major Procedure Performed:  1.  Ultrasound-guided access, right femoral artery  2.  Abdominal aortogram  3.  Bilateral lower extremity runoff  4.  Second-order catheterization  5.  Conscious sedation (48 minutes)  6.  Closure device (Mynx)     Indications: The patient has a new ulcer to his left second toe.  He is here today for further evaluation.  Procedure:  The patient was identified in the holding area and taken to room 8.  The patient was then placed supine on the table and prepped and draped in the usual sterile fashion.  A time out was called.  Conscious sedation was administered with the use of IV fentanyl and Versed under continuous physician and nurse monitoring.  Heart rate, blood pressure, and oxygen saturation were continuously monitored.  Total sedation time was 48 minutes.  Ultrasound was used to evaluate the right common femoral artery.  It was patent .  A digital ultrasound image was acquired.  A micropuncture needle was used to access the right common femoral artery under ultrasound guidance.  An 018 wire was advanced without resistance and a micropuncture sheath was placed.  The 018 wire was removed and a benson wire was placed.  The micropuncture sheath was exchanged for a 5 french sheath.  An omniflush catheter was advanced over the wire to the level of L-1.  An abdominal angiogram was obtained.  Next, using the omniflush catheter and a benson wire, the aortic bifurcation was crossed and the catheter was placed into theleft external iliac artery and left runoff was obtained.  right runoff was performed via retrograde sheath injections.  Findings:   Aortogram: Greater than 60% right renal artery stenosis was identified.  The left renal artery is widely patent.  The infrarenal abdominal aorta is  heavily calcified but patent without significant stenosis.  Bilateral common and external iliac arteries widely patent.  Right Lower Extremity: There is a exophytic plaque within the right common femoral artery with approximately 50% stenosis.  The profundofemoral artery is widely patent.  The superficial femoral artery is patent however it occludes at the adductor canal.  There is reconstitution of the below-knee popliteal artery.  The dominant runoff vessel is the anterior tibial artery.  Left Lower Extremity: The left common femoral artery is occluded.  The profundofemoral artery reconstitutes just beyond its origin as does the superficial femoral artery.  The popliteal artery is occluded in the above-knee segment with reconstitution of a diseased below-knee popliteal artery.  Anterior tibial artery is the dominant runoff vessel  Intervention: None  Impression:  #1  Exophytic right common femoral artery plaque with approximately 50% stenosis.  Occluded right superficial femoral and above-knee popliteal artery.  #2  Occluded left distal common femoral artery and left superficial femoral artery  #3  The patient will be scheduled for a left femoral endarterectomy and left femoral to below-knee popliteal artery bypass graft in the immediate future for limb salvage     V. Annamarie Major, M.D., [facility] [place] Office: [phone] Pager:  [phone]

## 2018-09-11 NOTE — Progress Notes (Signed)
No bleeding or hematoma noted after ambulation 

## 2018-09-11 NOTE — Addendum Note (Signed)
Addended by: Serafina Mitchell on: 09/11/2018 03:23 PM   Modules accepted: Orders

## 2018-09-11 NOTE — Interval H&P Note (Signed)
History and Physical Interval Note:  09/11/2018 9:29 AM  [name]  has presented today for surgery, with the diagnosis of PVD.  The various methods of treatment have been discussed with the patient and family. After consideration of risks, benefits and other options for treatment, the patient has consented to  Procedure(s): ABDOMINAL AORTOGRAM W/LOWER EXTREMITY (Bilateral) as a surgical intervention.  The patient's history has been reviewed, patient examined, no change in status, stable for surgery.  I have reviewed the patient's chart and labs.  Questions were answered to the patient's satisfaction.     Annamarie Major

## 2018-09-11 NOTE — Discharge Instructions (Signed)
Femoral Site Care °This sheet gives you information about how to care for yourself after your procedure. Your health care provider may also give you more specific instructions. If you have problems or questions, contact your health care provider. °What can I expect after the procedure? °After the procedure, it is common to have: °· Bruising that usually fades within 1-2 weeks. °· Tenderness at the site. °Follow these instructions at home: °Wound care °· Follow instructions from your health care provider about how to take care of your insertion site. Make sure you: °? Wash your hands with soap and water before you change your bandage (dressing). If soap and water are not available, use hand sanitizer. °? Change your dressing as told by your health care provider. °? Leave stitches (sutures), skin glue, or adhesive strips in place. These skin closures may need to stay in place for 2 weeks or longer. If adhesive strip edges start to loosen and curl up, you may trim the loose edges. Do not remove adhesive strips completely unless your health care provider tells you to do that. °· Do not take baths, swim, or use a hot tub until your health care provider approves. °· You may shower 24-48 hours after the procedure or as told by your health care provider. °? Gently wash the site with plain soap and water. °? Pat the area dry with a clean towel. °? Do not rub the site. This may cause bleeding. °· Do not apply powder or lotion to the site. Keep the site clean and dry. °· Check your femoral site every day for signs of infection. Check for: °? Redness, swelling, or pain. °? Fluid or blood. °? Warmth. °? Pus or a bad smell. °Activity °· For the first 2-3 days after your procedure, or as long as directed: °? Avoid climbing stairs as much as possible. °? Do not squat. °· Do not lift anything that is heavier than 10 lb (4.5 kg), or the limit that you are told, until your health care provider says that it is safe. °· Rest as  directed. °? Avoid sitting for a long time without moving. Get up to take short walks every 1-2 hours. °· Do not drive for 24 hours if you were given a medicine to help you relax (sedative). °General instructions °· Take over-the-counter and prescription medicines only as told by your health care provider. °· Keep all follow-up visits as told by your health care provider. This is important. °Contact a health care provider if you have: °· A fever or chills. °· You have redness, swelling, or pain around your insertion site. °Get help right away if: °· The catheter insertion area swells very fast. °· You pass out. °· You suddenly start to sweat or your skin gets clammy. °· The catheter insertion area is bleeding, and the bleeding does not stop when you hold steady pressure on the area. °· The area near or just beyond the catheter insertion site becomes pale, cool, tingly, or numb. °These symptoms may represent a serious problem that is an emergency. Do not wait to see if the symptoms will go away. Get medical help right away. Call your local emergency services (911 in the [place] Do not drive yourself to the hospital. °Summary °· After the procedure, it is common to have bruising that usually fades within 1-2 weeks. °· Check your femoral site every day for signs of infection. °· Do not lift anything that is heavier than 10 lb (4.5 kg), or the   limit that you are told, until your health care provider says that it is safe. °This information is not intended to replace advice given to you by your health care provider. Make sure you discuss any questions you have with your health care provider. °Document Released: 01/10/2014 Document Revised: 05/22/2017 Document Reviewed: 05/22/2017 °Elsevier Interactive Patient Education © 2019 [employer] ° °

## 2018-09-11 NOTE — Progress Notes (Signed)
Spoke with wife, Pamala Hurry, and gave d/c instructions via telephone due to [place] restrictions.  All questions answered and Pamala Hurry verbalized understanding

## 2018-09-11 NOTE — Progress Notes (Signed)
Call to TARA at pre-admission to see patient today for pre-op following today's angiogram.

## 2018-09-12 ENCOUNTER — Encounter (HOSPITAL_COMMUNITY): Payer: Self-pay | Admitting: Surgery

## 2018-09-13 ENCOUNTER — Other Ambulatory Visit: Payer: Self-pay

## 2018-09-13 ENCOUNTER — Encounter (HOSPITAL_COMMUNITY)
Admission: RE | Admit: 2018-09-13 | Discharge: 2018-09-13 | Disposition: A | Discharge status: Home / Self Care | Payer: PPO | Source: Ambulatory Visit | Attending: Surgery | Admitting: Surgery

## 2018-09-13 ENCOUNTER — Telehealth: Payer: Self-pay | Admitting: *Deleted

## 2018-09-13 ENCOUNTER — Encounter (HOSPITAL_COMMUNITY): Payer: Self-pay

## 2018-09-13 DIAGNOSIS — Z01812 Encounter for preprocedural laboratory examination: Secondary | ICD-10-CM | POA: Diagnosis not present

## 2018-09-13 LAB — CBC
HCT: 33 % — ABNORMAL LOW (ref 39.0–52.0)
Hemoglobin: 10.4 g/dL — ABNORMAL LOW (ref 13.0–17.0)
MCH: 28.9 pg (ref 26.0–34.0)
MCHC: 31.5 g/dL (ref 30.0–36.0)
MCV: 91.7 fL (ref 80.0–100.0)
Platelets: 222 10*3/uL (ref 150–400)
RBC: 3.6 MIL/uL — ABNORMAL LOW (ref 4.22–5.81)
RDW: 14.1 % (ref 11.5–15.5)
WBC: 8.2 10*3/uL (ref 4.0–10.5)
nRBC: 0 % (ref 0.0–0.2)

## 2018-09-13 LAB — COMPREHENSIVE METABOLIC PANEL
ALT: 30 U/L (ref 0–44)
AST: 28 U/L (ref 15–41)
Albumin: 3.4 g/dL — ABNORMAL LOW (ref 3.5–5.0)
Alkaline Phosphatase: 85 U/L (ref 38–126)
Anion gap: 10 (ref 5–15)
BUN: 34 mg/dL — ABNORMAL HIGH (ref 8–23)
CO2: 19 mmol/L — ABNORMAL LOW (ref 22–32)
Calcium: 9.1 mg/dL (ref 8.9–10.3)
Chloride: 108 mmol/L (ref 98–111)
Creatinine, Ser: 1.53 mg/dL — ABNORMAL HIGH (ref 0.61–1.24)
GFR calc Af Amer: 50 mL/min — ABNORMAL LOW (ref >60)
GFR calc non Af Amer: 43 mL/min — ABNORMAL LOW (ref >60)
Glucose, Bld: 242 mg/dL — ABNORMAL HIGH (ref 70–99)
Potassium: 4.2 mmol/L (ref 3.5–5.1)
Sodium: 137 mmol/L (ref 135–145)
Total Bilirubin: 0.6 mg/dL (ref 0.3–1.2)
Total Protein: 6.1 g/dL — ABNORMAL LOW (ref 6.5–8.1)

## 2018-09-13 LAB — SURGICAL PCR SCREEN
MRSA, PCR: NEGATIVE
Staphylococcus aureus: NEGATIVE

## 2018-09-13 LAB — GLUCOSE, CAPILLARY: Glucose-Capillary: 188 mg/dL — ABNORMAL HIGH (ref 70–99)

## 2018-09-13 LAB — APTT: aPTT: 29 seconds (ref 24–36)

## 2018-09-13 LAB — PROTIME-INR
INR: 1.1 (ref 0.8–1.2)
Prothrombin Time: 14.1 seconds (ref 11.4–15.2)

## 2018-09-13 NOTE — Telephone Encounter (Signed)
Call to patient to see if he had any questions about pre-op instructions for surgery on 09/18/2018. He state he received all instructions from pre-admission department and understood instructions. Informed him we have referred him to [facility].

## 2018-09-13 NOTE — Progress Notes (Signed)
PCP - Dr. [name] Cardiologist - Dr. [name]  Chest x-ray - N/A EKG - 12/25/17 Stress Test - 2016 ECHO - 2016 Cardiac Cath - 2016  Sleep Study - denies   Fasting Blood Sugar - 120-130 Checks Blood Sugar once daily   Aspirin Instructions: Patient to follow ASA guidelines per Dr. Desmond Dike. Patient instructed to hold all NSAID's, herbal medications, fish oil and vitamins 7 days prior to surgery.   Anesthesia review: cardiac history  Patient denies shortness of breath, fever, cough and chest pain at PAT appointment   Patient verbalized understanding of instructions that were given to them at the PAT appointment. Patient was also instructed that they will need to review over the PAT instructions again at home before surgery.

## 2018-09-13 NOTE — Pre-Procedure Instructions (Signed)
Mario [name]  09/13/2018      [facility] - [place], [place] - [address] [place] [address] Phone: [phone] Fax: [phone]  [place], [place] - [address] [place] [address] Phone: [phone] Fax: [phone]  [id number] - [place], [place] - [address] [place] [address] Phone: [phone] Fax: [phone]    Your procedure is scheduled on Tuesday April 28th.  Report to [facility] Admitting at 6:00 A.M.  Call this number if you have problems the morning of surgery:  [phone]   Remember:  Do not eat or drink after midnight.    Take these medicines the morning of surgery with A SIP OF WATER  carvedilol (COREG) tamsulosin (FLOMAX) acetaminophen (TYLENOL) if needed ARTIFICIAL TEAR OP if needed oxyCODONE-acetaminophen (PERCOCET) if needed traMADol (ULTRAM) if needed  Follow your surgeon's instructions on when to stop Asprin.  If no instructions were given by your surgeon then you will need to call the office to get those instructions.     As of today,  STOP taking any Aspirin(unless otherwise instructed by your surgeon), Aleve, Naproxen, Ibuprofen, Motrin, Advil, Goody's, BC's, all herbal medications, fish oil, and all vitamins.   HOW TO MANAGE YOUR DIABETES BEFORE AND AFTER SURGERY  Why is it important to control my blood sugar before and after surgery? . Improving blood sugar levels before and after surgery helps healing and can limit problems. . A way of improving blood sugar control is eating a healthy diet by: o  Eating less sugar and carbohydrates o  Increasing activity/exercise o  Talking with your doctor about reaching your blood sugar goals . High blood sugars (greater than 180 mg/dL) can raise your risk of infections and slow your recovery, so you will need to focus on  controlling your diabetes during the weeks before surgery. . Make sure that the doctor who takes care of your diabetes knows about your planned surgery including the date and location.  How do I manage my blood sugar before surgery? . Check your blood sugar at least 4 times a day, starting 2 days before surgery, to make sure that the level is not too high or low. o Check your blood sugar the morning of your surgery when you wake up and every 2 hours until you get to the Short Stay unit. . If your blood sugar is less than 70 mg/dL, you will need to treat for low blood sugar: o Do not take insulin. o Treat a low blood sugar (less than 70 mg/dL) with  cup of clear juice (cranberry or apple), 4 glucose tablets, OR glucose gel. Recheck blood sugar in 15 minutes after treatment (to make sure it is greater than 70 mg/dL). If your blood sugar is not greater than 70 mg/dL on recheck, call [phone] o  for further instructions. . Report your blood sugar to the short stay nurse when you get to Short Stay.  . If you are admitted to the hospital after surgery: o Your blood sugar will be checked by the staff and you will probably be given insulin after surgery (instead of oral diabetes medicines) to make sure you have good blood sugar levels. o The goal for blood sugar control after surgery is 80-180 mg/dL.     WHAT DO I DO ABOUT MY DIABETES MEDICATION?   Marland Kitchen Do not take oral  diabetes medicines (pills):metFORMIN (GLUCOPHAGE) or miglitol (GLYSET)  the morning of surgery.  . THE NIGHT BEFORE SURGERY, do NOT take evening dose of glipiZIDE (GLUCOTROL XL)  Take your usual dose of Victoza with dinner.       Do not wear jewelry, make-up or nail polish.  Do not wear lotions, powders, or perfumes, or deodorant.  Do not shave 48 hours prior to surgery.  Men may shave face and neck.  Do not bring valuables to the hospital.  [facility] is not responsible for any belongings or valuables.  Contacts, dentures  or bridgework may not be worn into surgery.  Leave your suitcase in the car.  After surgery it may be brought to your room.  For patients admitted to the hospital, discharge time will be determined by your treatment team.  Patients discharged the day of surgery will not be allowed to drive home.   [place]- Preparing For Surgery  Before surgery, you can play an important role. Because skin is not sterile, your skin needs to be as free of germs as possible. You can reduce the number of germs on your skin by washing with CHG (chlorahexidine gluconate) Soap before surgery.  CHG is an antiseptic cleaner which kills germs and bonds with the skin to continue killing germs even after washing.    Oral Hygiene is also important to reduce your risk of infection.  Remember - BRUSH YOUR TEETH THE MORNING OF SURGERY WITH YOUR REGULAR TOOTHPASTE  Please do not use if you have an allergy to CHG or antibacterial soaps. If your skin becomes reddened/irritated stop using the CHG.  Do not shave (including legs and underarms) for at least 48 hours prior to first CHG shower. It is OK to shave your face.  Please follow these instructions carefully.   1. Shower the NIGHT BEFORE SURGERY and the MORNING OF SURGERY with CHG.   2. If you chose to wash your hair, wash your hair first as usual with your normal shampoo.  3. After you shampoo, rinse your hair and body thoroughly to remove the shampoo.  4. Use CHG as you would any other liquid soap. You can apply CHG directly to the skin and wash gently with a scrungie or a clean washcloth.   5. Apply the CHG Soap to your body ONLY FROM THE NECK DOWN.  Do not use on open wounds or open sores. Avoid contact with your eyes, ears, mouth and genitals (private parts). Wash Face and genitals (private parts)  with your normal soap.  6. Wash thoroughly, paying special attention to the area where your surgery will be performed.  7. Thoroughly rinse your body with warm water  from the neck down.  8. DO NOT shower/wash with your normal soap after using and rinsing off the CHG Soap.  9. Pat yourself dry with a CLEAN TOWEL.  10. Wear CLEAN PAJAMAS to bed the night before surgery, wear comfortable clothes the morning of surgery  11. Place CLEAN SHEETS on your bed the night of your first shower and DO NOT SLEEP WITH PETS.    Day of Surgery: Shower as stated above. Do not apply any deodorants/lotions.  Please wear clean clothes to the hospital/surgery center.   Remember to brush your teeth WITH YOUR REGULAR TOOTHPASTE.

## 2018-09-14 ENCOUNTER — Telehealth: Payer: Self-pay | Admitting: *Deleted

## 2018-09-14 ENCOUNTER — Other Ambulatory Visit: Payer: Self-pay | Admitting: Vascular Surgery

## 2018-09-14 ENCOUNTER — Other Ambulatory Visit: Payer: Self-pay | Admitting: *Deleted

## 2018-09-14 DIAGNOSIS — I779 Disorder of arteries and arterioles, unspecified: Secondary | ICD-10-CM

## 2018-09-14 LAB — TYPE AND SCREEN
ABO/RH(D): O POS
Antibody Screen: NEGATIVE

## 2018-09-14 MED ORDER — TRAMADOL HCL 50 MG PO TABS: 50.0000 mg | ORAL_TABLET | Freq: Four times a day (QID) | ORAL | 0 refills | Status: DC | PRN

## 2018-09-14 NOTE — Anesthesia Preprocedure Evaluation (Addendum)
Anesthesia Evaluation  Patient identified by MRN, date of birth, ID band Patient awake    Reviewed: Allergy & Precautions, NPO status , Patient's Chart, lab work & pertinent test results  Airway Mallampati: II  TM Distance: >3 FB Neck ROM: Full    Dental no notable dental hx.    Pulmonary neg pulmonary ROS, former smoker,    Pulmonary exam normal breath sounds clear to auscultation       Cardiovascular hypertension, + CAD, + CABG, + Peripheral Vascular Disease and +CHF  Normal cardiovascular exam Rhythm:Regular Rate:Normal     Neuro/Psych negative neurological ROS  negative psych ROS   GI/Hepatic negative GI ROS, Neg liver ROS,   Endo/Other  diabetes  Renal/GU Renal InsufficiencyRenal disease  negative genitourinary   Musculoskeletal negative musculoskeletal ROS (+)   Abdominal   Peds negative pediatric ROS (+)  Hematology negative hematology ROS (+)   Anesthesia Other Findings   Reproductive/Obstetrics negative OB ROS                            Anesthesia Physical Anesthesia Plan  ASA: III  Anesthesia Plan: General   Post-op Pain Management:    Induction: Intravenous and Rapid sequence  PONV Risk Score and Plan: 2 and Ondansetron, Dexamethasone and Treatment may vary due to age or medical condition  Airway Management Planned: Oral ETT  Additional Equipment:   Intra-op Plan:   Post-operative Plan: Extubation in OR  Informed Consent: I have reviewed the patients History and Physical, chart, labs and discussed the procedure including the risks, benefits and alternatives for the proposed anesthesia with the patient or authorized representative who has indicated his/her understanding and acceptance.     Dental advisory given  Plan Discussed with: CRNA and Surgeon  Anesthesia Plan Comments: (See PAT note written 09/14/2018 by Myra Gianotti, PA-C. )       Anesthesia  Quick Evaluation

## 2018-09-14 NOTE — Telephone Encounter (Signed)
Patient called c/o little relief with Narcotic pain medication. Rx is every 4 hours as needed. He also has Tramadol which he states worked better. I informed patient he could take Tramadol or Oxycodone but not both and take as directed.Try different positions of comfort.

## 2018-09-14 NOTE — Telephone Encounter (Signed)
Refill for TRAMADOL by Dr. [name] after patient called stated better relief with Tramadol  Over Percocet. Instructed to take tramadol only for moderate to severe pain. [facility]. To report to [facility] ER for any acute or worsening condition. Verbalized understanding.

## 2018-09-14 NOTE — Progress Notes (Signed)
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    DISCUSSION: Patient is a 78 year old male scheduled for the above procedure. He has known PAD with non-healing left second toe ulcer.  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 [name]; right knee [facility] 04/2018 with inguinal metastasis and lung mass of unknown etiology; history also lists melanoma excision, but not seen in oncology records). According to 09/06/18 [facility] notes, he was for right groin lymph node dissection for SCC by general surgery. Oncology notes also mention possible bronchoscopy in the future for further evaluation of lung mass.   Last seen by cardiology 12/2017 with one year follow-up recommended. Cardiologist Dr. Tamala Julian recently (09/04/18) cleared him at "acceptable risk" for right inguinal lymph node dissection (see communication from Dr. Tamala Julian and Fabian Sharp, [place]), but appears he is scheduled for FPBG first on 09/18/18 with LN dissection on 09/19/18. As of 09/06/18, he was able to place pickleball up to ~ 2-3 weeks prior when the gym closed to COVID restrictions and due to increasing left foot pain. He denied SOB, cough, fever, and chest pain at PAT RN visit.  Based on currently available information, I would anticipate  that he can proceed as planned. Patient could not provide a UA specimen at PAT, so will need to be collected on the day of surgery.   VS: BP (!) 178/77 Comment: notified Meredith T/RN  Pulse 98   Temp 36.5 C   Resp 18   Ht 6\' 2"  (1.88 m)   Wt 73.7 kg   SpO2 100%   BMI 20.85 kg/m    PROVIDERS: [name], MD is PCP - [name], MD is cardiologist. Last visit 12/25/17 by [name], NP. No CV symptoms and still playing Pickleball (claudication symptoms after ~ 30 minutes) at that time. One year follow-up recommended. [name], MD is endocrinologist. Last visit 08/15/18. [name], MD is pulmonologist. Last visit 08/16/18. Patient being considered for FOB/transbronchial biopsy under fluoro, but also felt to be a good candidate for LUL excision biopsy with possible LU lobectomy pending when PFTs can be done after COVID restrictions lifted.  [name], MD is oncologist. Last visit 08/22/18.   LABS: Preoperative labs noted. Cr. 1.53 which appears overall stable (Cr primarily ~ 1.3-1.40 since 2019 with range of 1.18-1.49). HGB of 10.4 also appears stable when compared to labs since 05/2018. A1c 7.1 on 08/13/18. (all labs ordered are listed, but only abnormal results are displayed)  Labs Reviewed  GLUCOSE, CAPILLARY - Abnormal; Notable for the following components:      Result Value   Glucose-Capillary 188 (*)    All other components within normal limits  CBC - Abnormal; Notable for the following components:   RBC 3.60 (*)  Hemoglobin 10.4 (*)    HCT 33.0 (*)    All other components within normal limits  COMPREHENSIVE METABOLIC PANEL - Abnormal; Notable for the following components:   CO2 19 (*)    Glucose, Bld 242 (*)    BUN 34 (*)    Creatinine, Ser 1.53 (*)    Total Protein 6.1 (*)    Albumin 3.4 (*)    GFR calc non Af Amer 43 (*)    GFR calc Af Amer 50 (*)    All other components within normal limits  SURGICAL PCR SCREEN  APTT  PROTIME-INR  TYPE AND  SCREEN    IMAGES: PET Scan 07/10/18: IMPRESSION: 1. Hypermetabolism (max SUV 9.2) associated with the irregular 4.1 cm masslike focus of consolidation in the anterior left upper lung lobe, which has not changed since the recent 06/29/2018 chest CT, making pneumonia less likely. Findings are worrisome for primary bronchogenic carcinoma. 2. No hypermetabolic thoracic adenopathy. 3. Enlarged hypermetabolic right inguinal lymph node compatible with known metastatic disease. 4. No additional sites of hypermetabolic distant metastatic disease. 5. Chronic findings include: Aortic Atherosclerosis (ICD10-I70.0) and Emphysema (ICD10-J43.9). Dilated main pulmonary artery, suggesting pulmonary arterial hypertension. Ectatic 4.2 cm ascending thoracic aorta. Colonic diverticulosis.  CT chest/abd/pelvis 06/29/18: IMPRESSION: 1. Pathologically enlarged 2.1 cm right inguinal lymph node is observed. No other adenopathy identified. 2. 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 Recommend annual imaging followup by CTA or MRA. This recommendation follows 2010 ACCF/AHA/AATS/ACR/ASA/SCA/SCAI/SIR/STS/SVM Guidelines for the Diagnosis and Management of Patients with Thoracic Aortic Disease. Circulation. 2010; 121: O130-Q657. Aortic aneurysm NOS (ICD10-I71.9) 3. Airspace opacity anteriorly in the left upper lobe could be from pneumonia but is nonspecific. This is new from 10/16/2014. Followup PA and lateral chest X-ray is recommended in 3-4 weeks following trial of antibiotic therapy to ensure resolution and exclude underlying malignancy. Alternatively, if there are no symptoms supporting pneumonia, surveillance chest CT or nuclear medicine PET-CT might be considered. 4. Other imaging findings of potential clinical significance: Aortic Atherosclerosis (ICD10-I70.0). Coronary atherosclerosis. Dense mitral calcification. Heavy atherosclerotic calcification of the left common carotid artery  (consider carotid duplex). Emphysema (ICD10-J43.9). Periampullary duodenal diverticulum. Probable impingement at L4-5 due to spondylosis and degenerative disc disease.   EKG: 12/25/17 ([facility]): SR with first degree AV block. Abnormal QRS-T angle, consider primary T wave abnormality. [flat T wave I, mildly negative in aVL]. ST changes felt non-specific by cardiology at that visit.     CV: Abdominal Aortogram with BLE runoff 09/11/18: Impression:             #1  Exophytic right common femoral artery plaque with approximately 50% stenosis.  Occluded right superficial femoral and above-knee popliteal artery.             #2  Occluded left distal common femoral artery and left superficial femoral artery             #3  The patient will be scheduled for a left femoral endarterectomy and left femoral to below-knee popliteal artery bypass graft in the immediate future for limb salvage  Echo 03/26/15: Study Conclusions - Left ventricle: The cavity size was normal. Wall thickness was   increased in a pattern of mild LVH. Systolic function was normal.   The estimated ejection fraction was in the range of 50% to 55%.   Doppler parameters are consistent with abnormal left ventricular   relaxation (grade 1 diastolic dysfunction). - Mitral valve: There was mild regurgitation. - Left atrium: The atrium was mildly  dilated. (Previously 45-50% by 11/03/14 echo, 37% 09/10/14 stress test)  Carotid US 10/30/14: Summary: Bilateral: severe calcific plaque origin ICA with siginificant acoustic shadowing. 1-39% ICA stenosis. Vertebral artery flow is antegrade. ICA/CCA ratio: R-0.82 L-0.52.     Last cardiac cath was 10/02/14 PRE-CABG and showed 3V CAD, basal to mid inferior wall akinesis, EF 40%. S/p CABG 11/03/14.   Past Medical History:  Diagnosis Date  . Arthritis   . CAD (coronary artery disease)    a.  s/p IMI and BMS 1997;  b. Myoview 4/16:  inf scar with peri-infarct ischemia, EF 34%; high risk;  c.  LHC 5/16:  3 v CAD >> CABG (free L-LAD, S-OM, S-dRCA)  . Carotid stenosis    a. carotid US 6/16:  bilat ICA 1-39%  . Chronic kidney disease, stage II (mild)   . Chronic systolic CHF (congestive heart failure) ([employer])   . Essential hypertension, benign   . Gout   . HLD (hyperlipidemia)   . Ischemic cardiomyopathy    a. EF by myoview 4/16 34%; b. LHC 5/16: EF 40-45%;  c. intraop TEE 6/16: EF 45-50%  . Orthostasis   . PAD (peripheral artery disease) (HCC)    Dr. Fletcher Anon  . Pneumonia    hx of  . Type II or unspecified type diabetes mellitus without mention of complication, uncontrolled     Past Surgical History:  Procedure Laterality Date  . ABDOMINAL AORTAGRAM N/A 10/09/2013   Procedure: ABDOMINAL Maxcine Ham;  Surgeon: Wellington Hampshire, MD;  Location: [place] CATH LAB;  Service: Cardiovascular;  Laterality: N/A;  . ABDOMINAL AORTOGRAM N/A 12/28/2016   Procedure: ABDOMINAL AORTOGRAM;  Surgeon: Waynetta Sandy, MD;  Location: [place] CV LAB;  Service: Cardiovascular;  Laterality: N/A;  . ABDOMINAL AORTOGRAM W/LOWER EXTREMITY Bilateral 09/11/2018   Procedure: ABDOMINAL AORTOGRAM W/LOWER EXTREMITY;  Surgeon: Serafina Mitchell, MD;  Location: [place] CV LAB;  Service: Cardiovascular;  Laterality: Bilateral;  . CARDIAC CATHETERIZATION N/A 10/02/2014   Procedure: Left Heart Cath and Coronary Angiography;  Surgeon: Belva Crome, MD;  Location: [place] CV LAB;  Service: Cardiovascular;  Laterality: N/A;  . CATARACT EXTRACTION    . CHOLECYSTECTOMY    . COLONOSCOPY    . CORONARY ARTERY BYPASS GRAFT N/A 11/03/2014   Procedure: CORONARY ARTERY BYPASS GRAFTING  times three using left internal mammary and right greater saphenous vein;  Surgeon: Gaye Pollack, MD;  Location: [place] OR;  Service: Open Heart Surgery;  Laterality: N/A;  . HERNIA REPAIR    . LOWER EXTREMITY ANGIOGRAPHY Bilateral 12/28/2016   Procedure: Lower Extremity Angiography;  Surgeon: Waynetta Sandy, MD;  Location: [place] CV LAB;  Service: Cardiovascular;  Laterality: Bilateral;  . MELANOMA EXCISION    . PERIPHERAL VASCULAR BALLOON ANGIOPLASTY Right 12/28/2016   Procedure: PERIPHERAL VASCULAR BALLOON ANGIOPLASTY;  Surgeon: Waynetta Sandy, MD;  Location: [place] CV LAB;  Service: Cardiovascular;  Laterality: Right;  SFA UNSUCCESSFUL PTA  UNABLE TO CROSS LESION  . SKIN LESION EXCISION     multiple  . TEE WITHOUT CARDIOVERSION N/A 11/03/2014   Procedure: TRANSESOPHAGEAL ECHOCARDIOGRAM (TEE);  Surgeon: Gaye Pollack, MD;  Location: [place];  Service: Open Heart Surgery;  Laterality: N/A;    MEDICATIONS: . acetaminophen (TYLENOL) 500 MG tablet  . allopurinol (ZYLOPRIM) 100 MG tablet  . ARTIFICIAL TEAR OP  . aspirin EC 81 MG tablet  . atorvastatin (LIPITOR) 40 MG tablet  . Blood Glucose Monitoring Suppl (FREESTYLE LITE) DEVI  .  calcium carbonate (TUMS - DOSED IN MG ELEMENTAL CALCIUM) 500 MG chewable tablet  . carvedilol (COREG) 3.125 MG tablet  . Cholecalciferol (VITAMIN D) 2000 units CAPS  . Coenzyme Q10 (COQ10) 200 MG CAPS  . fenofibrate micronized (ANTARA) 43 MG capsule  . ferrous sulfate 325 (65 FE) MG tablet  . finasteride (PROSCAR) 5 MG tablet  . folic acid (FOLVITE) 820 MCG tablet  . glipiZIDE (GLUCOTROL XL) 10 MG 24 hr tablet  . glucose blood (FREESTYLE LITE) test strip  . Homeopathic Products (LEG CRAMP RELIEF) TABS  . hydrocortisone cream 1 %  . Lancets (FREESTYLE) lancets  . lisinopril (PRINIVIL,ZESTRIL) 5 MG tablet  . metFORMIN (GLUCOPHAGE) 1000 MG tablet  . miglitol (GLYSET) 50 MG tablet  . multivitamin-lutein (OCUVITE-LUTEIN) CAPS capsule  . oxyCODONE-acetaminophen (PERCOCET) 5-325 MG tablet  . tamsulosin (FLOMAX) 0.4 MG CAPS capsule  . traMADol (ULTRAM) 50 MG tablet  . VICTOZA 18 MG/3ML SOPN  . vitamin B-12 (CYANOCOBALAMIN) 1000 MCG tablet   No current facility-administered medications for this encounter.     Myra Gianotti, PA-C Surgical Short  Stay/Anesthesiology [facility] Phone [phone] [facility] Phone [phone] 09/14/2018 10:24 AM

## 2018-09-16 NOTE — H&P (Signed)
Mario [name]  Location: [facility] Patient #: [id number] DOB: November 02, 1940 Married / Language: English / Race: White Male       History of Present Illness       This is a 78 year old gentleman who returns for another visit to discuss and schedule surgery for his pathologically enlarged right inguinal lymph nodes. Dr. [name] is his PCP. His cardiologist is Dr. [name]. His vascular surgeon is Dr. [name]. His Mohs surgeon is Dr. [name]. His pulmonary medicine doctors now by Dr. [name].        Initially presented with a painless mass in his right groin without other symptoms. 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 It was guided biopsy of this mass showed squamous cell carcinoma. PET scan shows hypermetabolism in the left upper lobe with the long worrisome for primary bronchogenic carcinoma. Hypermetabolic right inguinal lymph node. No additional sites of hypermetabolic disease. 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 Left upper lobe airspace opacity. 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 Pulmonary medicine thinks that he ultimately might need to have the left upper lobe mass removed surgically. IR did not feel they could biopsy this. Dr. [name] did not feel that this was urgent. He is referred back to do the lymph node dissection in the right groin.       Comorbidities include coronary artery disease. CABG 2016 by Dr. [name]. . No shortness of breath or chest pain. Type 2 diabetes with CK D. Hypertension. Venous and arterial insufficiency. 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 Married lives with his wife. 3 children. Quit smoking 2016. Quit alcohol 2005. The surgery has been discussed with the patient, Dr. [name],  [place] conference team, and Dr. [name] in my office. We do not feel that he needs an extensive superficial and deep dissection for this disease. I described the procedure to him. He understands the indications, details, techniques, and risks of this surgery in detail. Described the risk of bleeding, infection, seroma, lymphocele, nerve damage, recurrence. He understands all these issues. All his questions are answered. He agrees with this plan.  We will ask his cardiologist for cardiac risk assessment and clearance Surgery will take 1 hour under general anesthesia as an outpatient.     Allergies  Penicillins  Rash, Nausea. Plavix *HEMATOLOGICAL AGENTS - MISC.*  Difficulty breathing. Allergies Reconciled   Medication History  Victoza (18MG /3ML Soln Pen-inj, Subcutaneous) Active. Tamsulosin HCl (0.4MG  Capsule, Oral) Active. metFORMIN HCl (1000MG  Tablet, Oral) Active. Miglitol (50MG  Tablet, Oral) Active. Lisinopril (5MG  Tablet, Oral) Active. Allopurinol (100MG  Tablet, Oral) Active. Atorvastatin Calcium (20MG  Tablet, Oral) Active. Carvedilol (3.125MG  Tablet, Oral) Active. Finasteride (5MG  Tablet, Oral) Active. glipiZIDE ER (10MG  Tablet ER 24HR, Oral) Active. Medications Reconciled  Vitals  Weight: 163.25 lb Height: 74in Body Surface Area: 1.99 m Body Mass Index: 20.96 kg/m  Temp.: 97.33F(Temporal)  Pulse: 87 (Regular)  P.OX: 70% (Room air) BP: 142/68 (Sitting, Left Arm, Standard)     Physical Exam General Mental Status-Alert. General Appearance-Not in acute distress. Build & Nutrition-Well nourished. Posture-Normal posture. Gait-Normal.  Integumentary Note: Long Y-shaped incision lateral right knee. Red flat moist skin lesion left thigh above the knee. Multiple dysplastic-appearing skin lesions both arms.   Head and Neck Head-normocephalic, atraumatic with  no lesions or palpable masses. Trachea-midline. Thyroid Gland  Characteristics - normal size and consistency and no palpable nodules.  Chest and Lung Exam Chest and lung exam reveals -on auscultation, normal breath sounds, no adventitious sounds and normal vocal resonance.  Cardiovascular Cardiovascular examination reveals -normal heart sounds, regular rate and rhythm with no murmurs and femoral artery auscultation bilaterally reveals normal pulses, no bruits, no thrills. Note: Palpable radial and femoral pulses with regular rhythm.   Abdomen Inspection Inspection of the abdomen reveals - No Hernias. Palpation/Percussion Palpation and Percussion of the abdomen reveal - Soft, Non Tender, No Rigidity (guarding), No hepatosplenomegaly and No Palpable abdominal masses.  Neurologic Neurologic evaluation reveals -alert and oriented x 3 with no impairment of recent or remote memory, normal attention span and ability to concentrate, normal sensation and normal coordination.  Musculoskeletal Normal Exam - Bilateral-Upper Extremity Strength Normal and Lower Extremity Strength Normal.  Lymphatic Note: 2.5 cm mobile mass in the right inguinal area. This is below the inguinal ligament and the superficial femoral triangle. Does not appear fixed. This is appears to be a solitary nodule. Left groin shows tiny palpable nodules, 3 mm or less. No neck or axillary adenopathy.     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 That has been excised by your skin surgeon. Core biopsy of the right inguinal lymph node also showed squamous cell carcinoma  In addition, you have been evaluated for a left lung mass. This may or may not be related to the squamous cell cancer in your groin. You have been evaluated by Dr. [name] at the cancer center. You have been evaluated by Dr. 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 Dalbert Batman, M.D., [facility] General and Minimally invasive Surgery Breast and Colorectal Surgery Office:   [phone] Pager:   [phone]

## 2018-09-16 NOTE — H&P (View-Only) (Signed)
Mario [name]  Location: [facility] Patient #: [id number] DOB: 08/22/1940 Married / Language: English / Race: White Male       History of Present Illness       This is a 78 year old gentleman who returns for another visit to discuss and schedule surgery for his pathologically enlarged right inguinal lymph nodes. Dr. [name] is his PCP. His cardiologist is Dr. [name]. His vascular surgeon is Dr. [name]. His Mohs surgeon is Dr. [name]. His pulmonary medicine doctors now by Dr. [name].        Initially presented with a painless mass in his right groin without other symptoms. 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 It was guided biopsy of this mass showed squamous cell carcinoma. PET scan shows hypermetabolism in the left upper lobe with the long worrisome for primary bronchogenic carcinoma. Hypermetabolic right inguinal lymph node. No additional sites of hypermetabolic disease. 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 Left upper lobe airspace opacity. 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 Pulmonary medicine thinks that he ultimately might need to have the left upper lobe mass removed surgically. IR did not feel they could biopsy this. Dr. [name] did not feel that this was urgent. He is referred back to do the lymph node dissection in the right groin.       Comorbidities include coronary artery disease. CABG 2016 by Dr. [name]. . No shortness of breath or chest pain. Type 2 diabetes with CK D. Hypertension. Venous and arterial insufficiency. 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 Married lives with his wife. 3 children. Quit smoking 2016. Quit alcohol 2005. The surgery has been discussed with the patient, Dr. [name],  [place] conference team, and Dr. [name] in my office. We do not feel that he needs an extensive superficial and deep dissection for this disease. I described the procedure to him. He understands the indications, details, techniques, and risks of this surgery in detail. Described the risk of bleeding, infection, seroma, lymphocele, nerve damage, recurrence. He understands all these issues. All his questions are answered. He agrees with this plan.  We will ask his cardiologist for cardiac risk assessment and clearance Surgery will take 1 hour under general anesthesia as an outpatient.     Allergies  Penicillins  Rash, Nausea. Plavix *HEMATOLOGICAL AGENTS - MISC.*  Difficulty breathing. Allergies Reconciled   Medication History  Victoza (18MG /3ML Soln Pen-inj, Subcutaneous) Active. Tamsulosin HCl (0.4MG  Capsule, Oral) Active. metFORMIN HCl (1000MG  Tablet, Oral) Active. Miglitol (50MG  Tablet, Oral) Active. Lisinopril (5MG  Tablet, Oral) Active. Allopurinol (100MG  Tablet, Oral) Active. Atorvastatin Calcium (20MG  Tablet, Oral) Active. Carvedilol (3.125MG  Tablet, Oral) Active. Finasteride (5MG  Tablet, Oral) Active. glipiZIDE ER (10MG  Tablet ER 24HR, Oral) Active. Medications Reconciled  Vitals  Weight: 163.25 lb Height: 74in Body Surface Area: 1.99 m Body Mass Index: 20.96 kg/m  Temp.: 97.76F(Temporal)  Pulse: 87 (Regular)  P.OX: 70% (Room air) BP: 142/68 (Sitting, Left Arm, Standard)     Physical Exam General Mental Status-Alert. General Appearance-Not in acute distress. Build & Nutrition-Well nourished. Posture-Normal posture. Gait-Normal.  Integumentary Note: Long Y-shaped incision lateral right knee. Red flat moist skin lesion left thigh above the knee. Multiple dysplastic-appearing skin lesions both arms.   Head and Neck Head-normocephalic, atraumatic with  no lesions or palpable masses. Trachea-midline. Thyroid Gland  Characteristics - normal size and consistency and no palpable nodules.  Chest and Lung Exam Chest and lung exam reveals -on auscultation, normal breath sounds, no adventitious sounds and normal vocal resonance.  Cardiovascular Cardiovascular examination reveals -normal heart sounds, regular rate and rhythm with no murmurs and femoral artery auscultation bilaterally reveals normal pulses, no bruits, no thrills. Note: Palpable radial and femoral pulses with regular rhythm.   Abdomen Inspection Inspection of the abdomen reveals - No Hernias. Palpation/Percussion Palpation and Percussion of the abdomen reveal - Soft, Non Tender, No Rigidity (guarding), No hepatosplenomegaly and No Palpable abdominal masses.  Neurologic Neurologic evaluation reveals -alert and oriented x 3 with no impairment of recent or remote memory, normal attention span and ability to concentrate, normal sensation and normal coordination.  Musculoskeletal Normal Exam - Bilateral-Upper Extremity Strength Normal and Lower Extremity Strength Normal.  Lymphatic Note: 2.5 cm mobile mass in the right inguinal area. This is below the inguinal ligament and the superficial femoral triangle. Does not appear fixed. This is appears to be a solitary nodule. Left groin shows tiny palpable nodules, 3 mm or less. No neck or axillary adenopathy.     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 That has been excised by your skin surgeon. Core biopsy of the right inguinal lymph node also showed squamous cell carcinoma  In addition, you have been evaluated for a left lung mass. This may or may not be related to the squamous cell cancer in your groin. You have been evaluated by Dr. [name] at the cancer center. You have been evaluated by Dr. 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 Dalbert Batman, M.D., [facility] General and Minimally invasive Surgery Breast and Colorectal Surgery Office:   [phone] Pager:   [phone]

## 2018-09-18 ENCOUNTER — Encounter (HOSPITAL_COMMUNITY): Payer: Self-pay | Admitting: *Deleted

## 2018-09-18 ENCOUNTER — Inpatient Hospital Stay (HOSPITAL_COMMUNITY): Payer: PPO | Admitting: Physician Assistant

## 2018-09-18 ENCOUNTER — Inpatient Hospital Stay (HOSPITAL_COMMUNITY)
Admission: RE | Admit: 2018-09-18 | Discharge: 2018-09-22 | DRG: 271 | Disposition: A | Discharge status: Home / Self Care | Payer: PPO | Attending: Surgery | Admitting: Surgery

## 2018-09-18 ENCOUNTER — Encounter (HOSPITAL_COMMUNITY)
Admission: RE | Disposition: A | Discharge status: Home / Self Care | Payer: Self-pay | Source: Home / Self Care | Attending: Surgery

## 2018-09-18 ENCOUNTER — Other Ambulatory Visit: Payer: Self-pay

## 2018-09-18 ENCOUNTER — Inpatient Hospital Stay (HOSPITAL_COMMUNITY): Payer: PPO | Admitting: Anesthesiology

## 2018-09-18 DIAGNOSIS — D62 Acute posthemorrhagic anemia: Secondary | ICD-10-CM | POA: Diagnosis not present

## 2018-09-18 DIAGNOSIS — I130 Hypertensive heart and chronic kidney disease with heart failure and stage 1 through stage 4 chronic kidney disease, or unspecified chronic kidney disease: Secondary | ICD-10-CM | POA: Diagnosis present

## 2018-09-18 DIAGNOSIS — M109 Gout, unspecified: Secondary | ICD-10-CM | POA: Diagnosis present

## 2018-09-18 DIAGNOSIS — L97529 Non-pressure chronic ulcer of other part of left foot with unspecified severity: Secondary | ICD-10-CM | POA: Diagnosis present

## 2018-09-18 DIAGNOSIS — C801 Malignant (primary) neoplasm, unspecified: Secondary | ICD-10-CM | POA: Diagnosis not present

## 2018-09-18 DIAGNOSIS — R918 Other nonspecific abnormal finding of lung field: Secondary | ICD-10-CM | POA: Diagnosis present

## 2018-09-18 DIAGNOSIS — I255 Ischemic cardiomyopathy: Secondary | ICD-10-CM | POA: Diagnosis present

## 2018-09-18 DIAGNOSIS — Z87891 Personal history of nicotine dependence: Secondary | ICD-10-CM

## 2018-09-18 DIAGNOSIS — I6523 Occlusion and stenosis of bilateral carotid arteries: Secondary | ICD-10-CM | POA: Diagnosis present

## 2018-09-18 DIAGNOSIS — C44722 Squamous cell carcinoma of skin of right lower limb, including hip: Secondary | ICD-10-CM | POA: Diagnosis present

## 2018-09-18 DIAGNOSIS — I739 Peripheral vascular disease, unspecified: Secondary | ICD-10-CM | POA: Diagnosis present

## 2018-09-18 DIAGNOSIS — I2510 Atherosclerotic heart disease of native coronary artery without angina pectoris: Secondary | ICD-10-CM | POA: Diagnosis present

## 2018-09-18 DIAGNOSIS — E11621 Type 2 diabetes mellitus with foot ulcer: Secondary | ICD-10-CM | POA: Diagnosis not present

## 2018-09-18 DIAGNOSIS — C774 Secondary and unspecified malignant neoplasm of inguinal and lower limb lymph nodes: Secondary | ICD-10-CM | POA: Diagnosis present

## 2018-09-18 DIAGNOSIS — Z79899 Other long term (current) drug therapy: Secondary | ICD-10-CM

## 2018-09-18 DIAGNOSIS — E1151 Type 2 diabetes mellitus with diabetic peripheral angiopathy without gangrene: Secondary | ICD-10-CM | POA: Diagnosis present

## 2018-09-18 DIAGNOSIS — I10 Essential (primary) hypertension: Secondary | ICD-10-CM | POA: Diagnosis not present

## 2018-09-18 DIAGNOSIS — E785 Hyperlipidemia, unspecified: Secondary | ICD-10-CM | POA: Diagnosis present

## 2018-09-18 DIAGNOSIS — Z888 Allergy status to other drugs, medicaments and biological substances status: Secondary | ICD-10-CM | POA: Diagnosis not present

## 2018-09-18 DIAGNOSIS — Z880 Allergy status to penicillin: Secondary | ICD-10-CM | POA: Diagnosis not present

## 2018-09-18 DIAGNOSIS — M1990 Unspecified osteoarthritis, unspecified site: Secondary | ICD-10-CM | POA: Diagnosis present

## 2018-09-18 DIAGNOSIS — N182 Chronic kidney disease, stage 2 (mild): Secondary | ICD-10-CM | POA: Diagnosis not present

## 2018-09-18 DIAGNOSIS — E119 Type 2 diabetes mellitus without complications: Secondary | ICD-10-CM | POA: Diagnosis not present

## 2018-09-18 DIAGNOSIS — I70245 Atherosclerosis of native arteries of left leg with ulceration of other part of foot: Secondary | ICD-10-CM | POA: Diagnosis present

## 2018-09-18 DIAGNOSIS — I5022 Chronic systolic (congestive) heart failure: Secondary | ICD-10-CM | POA: Diagnosis present

## 2018-09-18 DIAGNOSIS — R590 Localized enlarged lymph nodes: Secondary | ICD-10-CM | POA: Diagnosis present

## 2018-09-18 DIAGNOSIS — I712 Thoracic aortic aneurysm, without rupture: Secondary | ICD-10-CM | POA: Diagnosis present

## 2018-09-18 DIAGNOSIS — Z951 Presence of aortocoronary bypass graft: Secondary | ICD-10-CM | POA: Diagnosis not present

## 2018-09-18 DIAGNOSIS — E1122 Type 2 diabetes mellitus with diabetic chronic kidney disease: Secondary | ICD-10-CM | POA: Diagnosis present

## 2018-09-18 DIAGNOSIS — Z7984 Long term (current) use of oral hypoglycemic drugs: Secondary | ICD-10-CM | POA: Diagnosis not present

## 2018-09-18 HISTORY — PX: FEMORAL-POPLITEAL BYPASS GRAFT: SHX937

## 2018-09-18 HISTORY — DX: Localized enlarged lymph nodes: R59.0

## 2018-09-18 LAB — CBC WITH DIFFERENTIAL/PLATELET
Abs Immature Granulocytes: 0.02 10*3/uL (ref 0.00–0.07)
Basophils Absolute: 0 10*3/uL (ref 0.0–0.1)
Basophils Relative: 1 %
Eosinophils Absolute: 0.2 10*3/uL (ref 0.0–0.5)
Eosinophils Relative: 2 %
HCT: 31.8 % — ABNORMAL LOW (ref 39.0–52.0)
Hemoglobin: 10.2 g/dL — ABNORMAL LOW (ref 13.0–17.0)
Immature Granulocytes: 0 %
Lymphocytes Relative: 21 %
Lymphs Abs: 1.5 10*3/uL (ref 0.7–4.0)
MCH: 28.9 pg (ref 26.0–34.0)
MCHC: 32.1 g/dL (ref 30.0–36.0)
MCV: 90.1 fL (ref 80.0–100.0)
Monocytes Absolute: 0.7 10*3/uL (ref 0.1–1.0)
Monocytes Relative: 9 %
Neutro Abs: 5.1 10*3/uL (ref 1.7–7.7)
Neutrophils Relative %: 67 %
Platelets: 284 10*3/uL (ref 150–400)
RBC: 3.53 MIL/uL — ABNORMAL LOW (ref 4.22–5.81)
RDW: 13.9 % (ref 11.5–15.5)
WBC: 7.5 10*3/uL (ref 4.0–10.5)
nRBC: 0 % (ref 0.0–0.2)

## 2018-09-18 LAB — CBC
HCT: 27.3 % — ABNORMAL LOW (ref 39.0–52.0)
Hemoglobin: 8.6 g/dL — ABNORMAL LOW (ref 13.0–17.0)
MCH: 28.3 pg (ref 26.0–34.0)
MCHC: 31.5 g/dL (ref 30.0–36.0)
MCV: 89.8 fL (ref 80.0–100.0)
Platelets: 168 10*3/uL (ref 150–400)
RBC: 3.04 MIL/uL — ABNORMAL LOW (ref 4.22–5.81)
RDW: 13.6 % (ref 11.5–15.5)
WBC: 7.8 10*3/uL (ref 4.0–10.5)
nRBC: 0 % (ref 0.0–0.2)

## 2018-09-18 LAB — CREATININE, SERUM
Creatinine, Ser: 1.42 mg/dL — ABNORMAL HIGH (ref 0.61–1.24)
GFR calc Af Amer: 55 mL/min — ABNORMAL LOW (ref >60)
GFR calc non Af Amer: 47 mL/min — ABNORMAL LOW (ref >60)

## 2018-09-18 LAB — GLUCOSE, CAPILLARY
Glucose-Capillary: 162 mg/dL — ABNORMAL HIGH (ref 70–99)
Glucose-Capillary: 194 mg/dL — ABNORMAL HIGH (ref 70–99)
Glucose-Capillary: 253 mg/dL — ABNORMAL HIGH (ref 70–99)

## 2018-09-18 SURGERY — BYPASS GRAFT FEMORAL-POPLITEAL ARTERY
Anesthesia: General | Site: Leg Lower | Laterality: Left

## 2018-09-18 MED ORDER — DEXAMETHASONE SODIUM PHOSPHATE 10 MG/ML IJ SOLN
INTRAMUSCULAR | Status: AC
  Filled 2018-09-18: qty 1

## 2018-09-18 MED ORDER — FINASTERIDE 5 MG PO TABS
5.0000 mg | ORAL_TABLET | Freq: Every evening | ORAL | Status: DC
  Administered 2018-09-18 – 2018-09-21 (×4): 5 mg via ORAL
  Filled 2018-09-18 (×4): qty 1

## 2018-09-18 MED ORDER — VANCOMYCIN HCL IN DEXTROSE 1-5 GM/200ML-% IV SOLN
1000.0000 mg | INTRAVENOUS | Status: AC
  Administered 2018-09-18: 09:00:00 1000 mg via INTRAVENOUS

## 2018-09-18 MED ORDER — ALLOPURINOL 100 MG PO TABS
100.0000 mg | ORAL_TABLET | Freq: Every day | ORAL | Status: DC
  Administered 2018-09-19 – 2018-09-22 (×4): 100 mg via ORAL
  Filled 2018-09-18 (×4): qty 1

## 2018-09-18 MED ORDER — DEXAMETHASONE SODIUM PHOSPHATE 10 MG/ML IJ SOLN
INTRAMUSCULAR | Status: DC | PRN
  Administered 2018-09-18: 5 mg via INTRAVENOUS

## 2018-09-18 MED ORDER — PHENYLEPHRINE 40 MCG/ML (10ML) SYRINGE FOR IV PUSH (FOR BLOOD PRESSURE SUPPORT)
PREFILLED_SYRINGE | INTRAVENOUS | Status: AC
  Filled 2018-09-18: qty 10

## 2018-09-18 MED ORDER — SODIUM CHLORIDE 0.9 % IV SOLN: INTRAVENOUS | Status: DC

## 2018-09-18 MED ORDER — LIRAGLUTIDE 18 MG/3ML ~~LOC~~ SOPN
1.8000 mg | PEN_INJECTOR | Freq: Every day | SUBCUTANEOUS | Status: DC
  Administered 2018-09-19 – 2018-09-21 (×3): 1.8 mg via SUBCUTANEOUS
  Filled 2018-09-18: qty 3

## 2018-09-18 MED ORDER — PHENOL 1.4 % MT LIQD: 1.0000 | OROMUCOSAL | Status: DC | PRN

## 2018-09-18 MED ORDER — CHLORHEXIDINE GLUCONATE 4 % EX LIQD: 60.0000 mL | Freq: Once | CUTANEOUS | Status: DC

## 2018-09-18 MED ORDER — CALCIUM CARBONATE ANTACID 500 MG PO CHEW: 1.0000 | CHEWABLE_TABLET | Freq: Every day | ORAL | Status: DC | PRN

## 2018-09-18 MED ORDER — HEPARIN SODIUM (PORCINE) 5000 UNIT/ML IJ SOLN
5000.0000 [IU] | Freq: Three times a day (TID) | INTRAMUSCULAR | Status: DC
  Administered 2018-09-19 – 2018-09-22 (×8): 5000 [IU] via SUBCUTANEOUS
  Filled 2018-09-18 (×7): qty 1

## 2018-09-18 MED ORDER — FENTANYL CITRATE (PF) 250 MCG/5ML IJ SOLN
INTRAMUSCULAR | Status: AC
  Filled 2018-09-18: qty 5

## 2018-09-18 MED ORDER — ALUM & MAG HYDROXIDE-SIMETH 200-200-20 MG/5ML PO SUSP: 15.0000 mL | ORAL | Status: DC | PRN

## 2018-09-18 MED ORDER — CARVEDILOL 3.125 MG PO TABS
3.1250 mg | ORAL_TABLET | Freq: Two times a day (BID) | ORAL | Status: DC
  Administered 2018-09-18 – 2018-09-22 (×8): 3.125 mg via ORAL
  Filled 2018-09-18 (×7): qty 1

## 2018-09-18 MED ORDER — LIDOCAINE 2% (20 MG/ML) 5 ML SYRINGE
INTRAMUSCULAR | Status: DC | PRN
  Administered 2018-09-18: 80 mg via INTRAVENOUS

## 2018-09-18 MED ORDER — INSULIN ASPART 100 UNIT/ML ~~LOC~~ SOLN
0.0000 [IU] | Freq: Three times a day (TID) | SUBCUTANEOUS | Status: DC
  Administered 2018-09-19: 2 [IU] via SUBCUTANEOUS
  Administered 2018-09-19: 9 [IU] via SUBCUTANEOUS
  Administered 2018-09-20 (×2): 3 [IU] via SUBCUTANEOUS
  Administered 2018-09-20 – 2018-09-21 (×2): 2 [IU] via SUBCUTANEOUS
  Administered 2018-09-21: 3 [IU] via SUBCUTANEOUS
  Administered 2018-09-22 (×2): 2 [IU] via SUBCUTANEOUS

## 2018-09-18 MED ORDER — BISACODYL 5 MG PO TBEC: 5.0000 mg | DELAYED_RELEASE_TABLET | Freq: Every day | ORAL | Status: DC | PRN

## 2018-09-18 MED ORDER — POTASSIUM CHLORIDE CRYS ER 20 MEQ PO TBCR: 20.0000 meq | EXTENDED_RELEASE_TABLET | Freq: Every day | ORAL | Status: DC | PRN

## 2018-09-18 MED ORDER — MAGNESIUM SULFATE 2 GM/50ML IV SOLN: 2.0000 g | Freq: Every day | INTRAVENOUS | Status: DC | PRN

## 2018-09-18 MED ORDER — SODIUM CHLORIDE 0.9 % IV SOLN
INTRAVENOUS | Status: DC | PRN
  Administered 2018-09-18: 16:00:00 via INTRAVENOUS

## 2018-09-18 MED ORDER — OCUVITE-LUTEIN PO CAPS
1.0000 | ORAL_CAPSULE | Freq: Two times a day (BID) | ORAL | Status: DC
  Filled 2018-09-18: qty 1

## 2018-09-18 MED ORDER — ACETAMINOPHEN 325 MG PO TABS: 325.0000 mg | ORAL_TABLET | ORAL | Status: DC | PRN

## 2018-09-18 MED ORDER — SODIUM CHLORIDE 0.9 % IV SOLN
INTRAVENOUS | Status: DC | PRN
  Administered 2018-09-18: 500 mL

## 2018-09-18 MED ORDER — HYDROMORPHONE HCL 1 MG/ML IJ SOLN
INTRAMUSCULAR | Status: AC
  Filled 2018-09-18: qty 1

## 2018-09-18 MED ORDER — DOCUSATE SODIUM 100 MG PO CAPS
100.0000 mg | ORAL_CAPSULE | Freq: Every day | ORAL | Status: DC
  Administered 2018-09-19 – 2018-09-22 (×4): 100 mg via ORAL
  Filled 2018-09-18 (×4): qty 1

## 2018-09-18 MED ORDER — PROPOFOL 10 MG/ML IV BOLUS
INTRAVENOUS | Status: DC | PRN
  Administered 2018-09-18: 110 mg via INTRAVENOUS

## 2018-09-18 MED ORDER — MORPHINE SULFATE (PF) 2 MG/ML IV SOLN: 1.0000 mg | INTRAVENOUS | Status: DC | PRN

## 2018-09-18 MED ORDER — METFORMIN HCL 500 MG PO TABS
1000.0000 mg | ORAL_TABLET | Freq: Two times a day (BID) | ORAL | Status: DC
  Administered 2018-09-19 – 2018-09-22 (×6): 1000 mg via ORAL
  Filled 2018-09-18 (×7): qty 2

## 2018-09-18 MED ORDER — ACETAMINOPHEN 10 MG/ML IV SOLN: 1000.0000 mg | Freq: Once | INTRAVENOUS | Status: DC | PRN

## 2018-09-18 MED ORDER — LABETALOL HCL 5 MG/ML IV SOLN
10.0000 mg | INTRAVENOUS | Status: DC | PRN
  Administered 2018-09-20: 10 mg via INTRAVENOUS
  Filled 2018-09-18: qty 4

## 2018-09-18 MED ORDER — SUCCINYLCHOLINE CHLORIDE 20 MG/ML IJ SOLN
INTRAMUSCULAR | Status: DC | PRN
  Administered 2018-09-18: 80 mg via INTRAVENOUS

## 2018-09-18 MED ORDER — TAMSULOSIN HCL 0.4 MG PO CAPS
0.4000 mg | ORAL_CAPSULE | Freq: Two times a day (BID) | ORAL | Status: DC
  Administered 2018-09-18 – 2018-09-22 (×8): 0.4 mg via ORAL
  Filled 2018-09-18 (×8): qty 1

## 2018-09-18 MED ORDER — ONDANSETRON HCL 4 MG/2ML IJ SOLN
INTRAMUSCULAR | Status: AC
  Filled 2018-09-18: qty 2

## 2018-09-18 MED ORDER — PANTOPRAZOLE SODIUM 40 MG PO TBEC
40.0000 mg | DELAYED_RELEASE_TABLET | Freq: Every day | ORAL | Status: DC
  Administered 2018-09-18 – 2018-09-22 (×5): 40 mg via ORAL
  Filled 2018-09-18 (×5): qty 1

## 2018-09-18 MED ORDER — GLIPIZIDE ER 10 MG PO TB24
10.0000 mg | ORAL_TABLET | Freq: Every day | ORAL | Status: DC
  Administered 2018-09-18 – 2018-09-21 (×4): 10 mg via ORAL
  Filled 2018-09-18 (×7): qty 1

## 2018-09-18 MED ORDER — ROCURONIUM BROMIDE 50 MG/5ML IV SOSY
PREFILLED_SYRINGE | INTRAVENOUS | Status: AC
  Filled 2018-09-18: qty 5

## 2018-09-18 MED ORDER — PROTAMINE SULFATE 10 MG/ML IV SOLN
INTRAVENOUS | Status: AC
  Filled 2018-09-18: qty 5

## 2018-09-18 MED ORDER — SODIUM CHLORIDE 0.9 % IV SOLN: 500.0000 mL | Freq: Once | INTRAVENOUS | Status: DC | PRN

## 2018-09-18 MED ORDER — HEMOSTATIC AGENTS (NO CHARGE) OPTIME
TOPICAL | Status: DC | PRN
  Administered 2018-09-18 (×2): 1 via TOPICAL

## 2018-09-18 MED ORDER — FERROUS SULFATE 325 (65 FE) MG PO TABS
325.0000 mg | ORAL_TABLET | Freq: Every day | ORAL | Status: DC
  Administered 2018-09-19 – 2018-09-22 (×4): 325 mg via ORAL
  Filled 2018-09-18 (×4): qty 1

## 2018-09-18 MED ORDER — ROCURONIUM BROMIDE 50 MG/5ML IV SOSY
PREFILLED_SYRINGE | INTRAVENOUS | Status: DC | PRN
  Administered 2018-09-18: 30 mg via INTRAVENOUS
  Administered 2018-09-18: 20 mg via INTRAVENOUS

## 2018-09-18 MED ORDER — PROSIGHT PO TABS
1.0000 | ORAL_TABLET | Freq: Two times a day (BID) | ORAL | Status: DC
  Administered 2018-09-18 – 2018-09-22 (×8): 1 via ORAL
  Filled 2018-09-18 (×8): qty 1

## 2018-09-18 MED ORDER — FENTANYL CITRATE (PF) 100 MCG/2ML IJ SOLN
INTRAMUSCULAR | Status: DC | PRN
  Administered 2018-09-18 (×7): 50 ug via INTRAVENOUS

## 2018-09-18 MED ORDER — PROPOFOL 10 MG/ML IV BOLUS
INTRAVENOUS | Status: AC
  Filled 2018-09-18: qty 20

## 2018-09-18 MED ORDER — OXYCODONE-ACETAMINOPHEN 5-325 MG PO TABS
1.0000 | ORAL_TABLET | ORAL | Status: DC | PRN
  Administered 2018-09-19 – 2018-09-22 (×11): 1 via ORAL
  Filled 2018-09-18 (×12): qty 1

## 2018-09-18 MED ORDER — LIDOCAINE 2% (20 MG/ML) 5 ML SYRINGE
INTRAMUSCULAR | Status: AC
  Filled 2018-09-18: qty 5

## 2018-09-18 MED ORDER — ACETAMINOPHEN 325 MG RE SUPP: 325.0000 mg | RECTAL | Status: DC | PRN

## 2018-09-18 MED ORDER — FLEET ENEMA 7-19 GM/118ML RE ENEM: 1.0000 | ENEMA | Freq: Once | RECTAL | Status: DC | PRN

## 2018-09-18 MED ORDER — PHENYLEPHRINE 40 MCG/ML (10ML) SYRINGE FOR IV PUSH (FOR BLOOD PRESSURE SUPPORT)
PREFILLED_SYRINGE | INTRAVENOUS | Status: DC | PRN
  Administered 2018-09-18: 120 ug via INTRAVENOUS
  Administered 2018-09-18: 160 ug via INTRAVENOUS
  Administered 2018-09-18: 80 ug via INTRAVENOUS
  Administered 2018-09-18 (×2): 120 ug via INTRAVENOUS
  Administered 2018-09-18: 80 ug via INTRAVENOUS

## 2018-09-18 MED ORDER — VANCOMYCIN HCL IN DEXTROSE 1-5 GM/200ML-% IV SOLN
INTRAVENOUS | Status: AC
  Administered 2018-09-18: 1000 mg via INTRAVENOUS
  Filled 2018-09-18: qty 200

## 2018-09-18 MED ORDER — SODIUM CHLORIDE 0.9 % IV SOLN
INTRAVENOUS | Status: DC | PRN
  Administered 2018-09-18: 15:00:00 100 ug/min via INTRAVENOUS
  Administered 2018-09-18: 25 ug/min via INTRAVENOUS

## 2018-09-18 MED ORDER — HYDROMORPHONE HCL 1 MG/ML IJ SOLN
0.2500 mg | INTRAMUSCULAR | Status: DC | PRN
  Administered 2018-09-18: 0.5 mg via INTRAVENOUS

## 2018-09-18 MED ORDER — 0.9 % SODIUM CHLORIDE (POUR BTL) OPTIME
TOPICAL | Status: DC | PRN
  Administered 2018-09-18: 1000 mL

## 2018-09-18 MED ORDER — GUAIFENESIN-DM 100-10 MG/5ML PO SYRP: 15.0000 mL | ORAL_SOLUTION | ORAL | Status: DC | PRN

## 2018-09-18 MED ORDER — SODIUM CHLORIDE 0.9 % IV SOLN
INTRAVENOUS | Status: AC
  Filled 2018-09-18: qty 1.2

## 2018-09-18 MED ORDER — METOPROLOL TARTRATE 5 MG/5ML IV SOLN: 2.0000 mg | INTRAVENOUS | Status: DC | PRN

## 2018-09-18 MED ORDER — HEPARIN SODIUM (PORCINE) 1000 UNIT/ML IJ SOLN
INTRAMUSCULAR | Status: DC | PRN
  Administered 2018-09-18: 2000 [IU] via INTRAVENOUS
  Administered 2018-09-18: 1000 [IU] via INTRAVENOUS
  Administered 2018-09-18: 8000 [IU] via INTRAVENOUS

## 2018-09-18 MED ORDER — HYDRALAZINE HCL 20 MG/ML IJ SOLN: 5.0000 mg | INTRAMUSCULAR | Status: DC | PRN

## 2018-09-18 MED ORDER — PROMETHAZINE HCL 25 MG/ML IJ SOLN: 6.2500 mg | INTRAMUSCULAR | Status: DC | PRN

## 2018-09-18 MED ORDER — PROTAMINE SULFATE 10 MG/ML IV SOLN
INTRAVENOUS | Status: DC | PRN
  Administered 2018-09-18: 50 mg via INTRAVENOUS

## 2018-09-18 MED ORDER — ONDANSETRON HCL 4 MG/2ML IJ SOLN
INTRAMUSCULAR | Status: DC | PRN
  Administered 2018-09-18: 4 mg via INTRAVENOUS

## 2018-09-18 MED ORDER — TRAMADOL HCL 50 MG PO TABS
50.0000 mg | ORAL_TABLET | Freq: Four times a day (QID) | ORAL | Status: DC | PRN
  Administered 2018-09-18 – 2018-09-20 (×4): 50 mg via ORAL
  Filled 2018-09-18 (×4): qty 1

## 2018-09-18 MED ORDER — ONDANSETRON HCL 4 MG/2ML IJ SOLN: 4.0000 mg | Freq: Four times a day (QID) | INTRAMUSCULAR | Status: DC | PRN

## 2018-09-18 MED ORDER — LISINOPRIL 5 MG PO TABS
5.0000 mg | ORAL_TABLET | Freq: Every day | ORAL | Status: DC
  Administered 2018-09-19 – 2018-09-22 (×4): 5 mg via ORAL
  Filled 2018-09-18 (×4): qty 1

## 2018-09-18 MED ORDER — SODIUM CHLORIDE (PF) 0.9 % IJ SOLN
INTRAMUSCULAR | Status: AC
  Filled 2018-09-18: qty 10

## 2018-09-18 MED ORDER — ASPIRIN EC 81 MG PO TBEC
81.0000 mg | DELAYED_RELEASE_TABLET | Freq: Every day | ORAL | Status: DC
  Administered 2018-09-18 – 2018-09-22 (×5): 81 mg via ORAL
  Filled 2018-09-18 (×5): qty 1

## 2018-09-18 MED ORDER — ALBUMIN HUMAN 5 % IV SOLN
INTRAVENOUS | Status: DC | PRN
  Administered 2018-09-18: 16:00:00 via INTRAVENOUS

## 2018-09-18 MED ORDER — LACTATED RINGERS IV SOLN
INTRAVENOUS | Status: DC
  Administered 2018-09-18 (×2): via INTRAVENOUS

## 2018-09-18 MED ORDER — ATORVASTATIN CALCIUM 40 MG PO TABS
40.0000 mg | ORAL_TABLET | Freq: Every day | ORAL | Status: DC
  Administered 2018-09-19 – 2018-09-22 (×4): 40 mg via ORAL
  Filled 2018-09-18 (×4): qty 1

## 2018-09-18 MED ORDER — SENNOSIDES-DOCUSATE SODIUM 8.6-50 MG PO TABS: 1.0000 | ORAL_TABLET | Freq: Every evening | ORAL | Status: DC | PRN

## 2018-09-18 MED ORDER — MIGLITOL 50 MG PO TABS
50.0000 mg | ORAL_TABLET | Freq: Two times a day (BID) | ORAL | Status: DC
  Filled 2018-09-18 (×7): qty 1

## 2018-09-18 SURGICAL SUPPLY — 68 items
BANDAGE ESMARK 6X9 LF (GAUZE/BANDAGES/DRESSINGS) ×1 IMPLANT
BNDG ESMARK 6X9 LF (GAUZE/BANDAGES/DRESSINGS) ×2
CANISTER SUCT 3000ML PPV (MISCELLANEOUS) ×2 IMPLANT
CANNULA VESSEL 3MM 2 BLNT TIP (CANNULA) ×2 IMPLANT
CATH EMB 4FR 80CM (CATHETERS) ×2 IMPLANT
CLIP VESOCCLUDE MED 24/CT (CLIP) ×2 IMPLANT
CLIP VESOCCLUDE SM WIDE 24/CT (CLIP) ×2 IMPLANT
COVER PROBE W GEL 5X96 (DRAPES) ×2 IMPLANT
COVER WAND RF STERILE (DRAPES) ×2 IMPLANT
CUFF TOURN SGL QUICK 34 (TOURNIQUET CUFF) ×1
CUFF TOURNIQUET SINGLE 24IN (TOURNIQUET CUFF) IMPLANT
CUFF TOURNIQUET SINGLE 34IN LL (TOURNIQUET CUFF) IMPLANT
CUFF TOURNIQUET SINGLE 44IN (TOURNIQUET CUFF) IMPLANT
CUFF TRNQT CYL 34X4.125X (TOURNIQUET CUFF) ×1 IMPLANT
DERMABOND ADVANCED (GAUZE/BANDAGES/DRESSINGS) ×3
DERMABOND ADVANCED .7 DNX12 (GAUZE/BANDAGES/DRESSINGS) ×3 IMPLANT
DRAIN CHANNEL 15F RND FF W/TCR (WOUND CARE) IMPLANT
DRAPE HALF SHEET 40X57 (DRAPES) IMPLANT
DRAPE X-RAY CASS 24X20 (DRAPES) IMPLANT
ELECT REM PT RETURN 9FT ADLT (ELECTROSURGICAL) ×2
ELECTRODE REM PT RTRN 9FT ADLT (ELECTROSURGICAL) ×1 IMPLANT
EVACUATOR SILICONE 100CC (DRAIN) IMPLANT
GLOVE BIO SURGEON STRL SZ 6.5 (GLOVE) ×2 IMPLANT
GLOVE BIOGEL PI IND STRL 7.5 (GLOVE) ×1 IMPLANT
GLOVE BIOGEL PI INDICATOR 7.5 (GLOVE) ×1
GLOVE SURG SS PI 7.0 STRL IVOR (GLOVE) ×2 IMPLANT
GLOVE SURG SS PI 7.5 STRL IVOR (GLOVE) ×2 IMPLANT
GOWN STRL REUS W/ TWL LRG LVL3 (GOWN DISPOSABLE) ×2 IMPLANT
GOWN STRL REUS W/ TWL XL LVL3 (GOWN DISPOSABLE) ×1 IMPLANT
GOWN STRL REUS W/TWL LRG LVL3 (GOWN DISPOSABLE) ×2
GOWN STRL REUS W/TWL XL LVL3 (GOWN DISPOSABLE) ×1
GRAFT PROPATEN W/RING 6X80X60 (Vascular Products) ×2 IMPLANT
HEMOSTAT SNOW SURGICEL 2X4 (HEMOSTASIS) ×2 IMPLANT
INSERT FOGARTY SM (MISCELLANEOUS) IMPLANT
KIT BASIN OR (CUSTOM PROCEDURE TRAY) ×2 IMPLANT
KIT TURNOVER KIT B (KITS) ×2 IMPLANT
MARKER GRAFT CORONARY BYPASS (MISCELLANEOUS) IMPLANT
NS IRRIG 1000ML POUR BTL (IV SOLUTION) ×4 IMPLANT
PACK PERIPHERAL VASCULAR (CUSTOM PROCEDURE TRAY) ×2 IMPLANT
PAD ARMBOARD 7.5X6 YLW CONV (MISCELLANEOUS) ×4 IMPLANT
SET COLLECT BLD 21X3/4 12 (NEEDLE) IMPLANT
SPONGE INTESTINAL PEANUT (DISPOSABLE) ×2 IMPLANT
SPONGE LAP 18X18 RF (DISPOSABLE) ×4 IMPLANT
STOPCOCK 4 WAY LG BORE MALE ST (IV SETS) ×2 IMPLANT
SUT ETHILON 3 0 PS 1 (SUTURE) IMPLANT
SUT GORETEX 6.0 TT13 (SUTURE) IMPLANT
SUT GORETEX 6.0 TT9 (SUTURE) IMPLANT
SUT PROLENE 5 0 C 1 24 (SUTURE) ×12 IMPLANT
SUT PROLENE 6 0 BV (SUTURE) ×6 IMPLANT
SUT PROLENE 7 0 BV 1 (SUTURE) IMPLANT
SUT PROLENE 7 0 BV1 MDA (SUTURE) ×4 IMPLANT
SUT SILK 2 0 (SUTURE) ×1
SUT SILK 2 0 SH (SUTURE) ×4 IMPLANT
SUT SILK 2-0 18XBRD TIE 12 (SUTURE) ×1 IMPLANT
SUT SILK 3 0 (SUTURE) ×1
SUT SILK 3-0 18XBRD TIE 12 (SUTURE) ×1 IMPLANT
SUT VIC AB 2-0 CT1 27 (SUTURE) ×3
SUT VIC AB 2-0 CT1 TAPERPNT 27 (SUTURE) ×3 IMPLANT
SUT VIC AB 3-0 SH 27 (SUTURE) ×3
SUT VIC AB 3-0 SH 27X BRD (SUTURE) ×3 IMPLANT
SUT VICRYL 4-0 PS2 18IN ABS (SUTURE) ×4 IMPLANT
SYR 3ML LL SCALE MARK (SYRINGE) ×2 IMPLANT
TOWEL GREEN STERILE (TOWEL DISPOSABLE) ×2 IMPLANT
TRAY FOLEY MTR SLVR 16FR STAT (SET/KITS/TRAYS/PACK) IMPLANT
TRAY FOLEY SLVR 16FR LF STAT (SET/KITS/TRAYS/PACK) ×2 IMPLANT
TUBING EXTENTION W/L.L. (IV SETS) IMPLANT
UNDERPAD 30X30 (UNDERPADS AND DIAPERS) ×2 IMPLANT
WATER STERILE IRR 1000ML POUR (IV SOLUTION) ×2 IMPLANT

## 2018-09-18 NOTE — Progress Notes (Signed)
Per pharmacy, Mario [name] does not carry Victoza and Glyset. Patient currently takes these at home. Spoke with patient's wife and she will bring the medications to the hospital in the morning.   Emelda Fear, RN

## 2018-09-18 NOTE — Anesthesia Procedure Notes (Deleted)
Performed by: Myrtie Soman, MD

## 2018-09-18 NOTE — Anesthesia Postprocedure Evaluation (Signed)
Anesthesia Post Note  Patient: Mario [name]  Procedure(s) Performed: LEFT ILEO FEMORAL ENDARTERECTOMY WITH VEIN PATCH AND ANGIOPLASTY, POPLITEAL  ENDARTERECTOMY, FEM POP BYPASS (Left Leg Lower)     Patient location during evaluation: PACU Anesthesia Type: General Level of consciousness: awake and alert Pain management: pain level controlled Vital Signs Assessment: post-procedure vital signs reviewed and stable Respiratory status: spontaneous breathing, nonlabored ventilation, respiratory function stable and patient connected to nasal cannula oxygen Cardiovascular status: blood pressure returned to baseline and stable Postop Assessment: no apparent nausea or vomiting Anesthetic complications: no    Last Vitals:  Vitals:   09/18/18 1611 09/18/18 1633  BP: 122/61 (!) 142/62  Pulse: 88 86  Resp: 16 13  Temp: 36.7 C   SpO2: 98% 98%    Last Pain:  Vitals:   09/18/18 1633  TempSrc:   PainSc: 0-No pain                 Laisha Rau S

## 2018-09-18 NOTE — Anesthesia Procedure Notes (Signed)
Procedure Name: Intubation Date/Time: 09/18/2018 10:45 AM Performed by: Barrington Ellison, CRNA Pre-anesthesia Checklist: Patient identified, Emergency Drugs available, Suction available and Patient being monitored Patient Re-evaluated:Patient Re-evaluated prior to induction Oxygen Delivery Method: Circle System Utilized Preoxygenation: Pre-oxygenation with 100% oxygen Induction Type: IV induction Ventilation: Mask ventilation without difficulty Laryngoscope Size: Mac and 4 Grade View: Grade II Tube type: Oral Tube size: 7.5 mm Number of attempts: 1 Airway Equipment and Method: Stylet and Oral airway Placement Confirmation: ETT inserted through vocal cords under direct vision,  positive ETCO2 and breath sounds checked- equal and bilateral Secured at: 21 cm Tube secured with: Tape Dental Injury: Teeth and Oropharynx as per pre-operative assessment

## 2018-09-18 NOTE — H&P (Signed)
   Patient name: Mario [name] MRN: [id number] DOB: 1940/10/15 Sex: male    HISTORY OF PRESENT ILLNESS:   Mario [name] is a 78 y.o. male with a left toe wound.  Recent angio showed CFA/OFA/SFA occlusion.  His only options for limb salvage is bypass  CURRENT MEDICATIONS:    Current Facility-Administered Medications  Medication Dose Route Frequency Provider Last Rate Last Dose  . 0.9 %  sodium chloride infusion   Intravenous Continuous Serafina Mitchell, MD      . chlorhexidine (HIBICLENS) 4 % liquid 4 application  60 mL Topical Once Serafina Mitchell, MD       And  . Derrill Memo ON 09/19/2018] chlorhexidine (HIBICLENS) 4 % liquid 4 application  60 mL Topical Once Serafina Mitchell, MD      . lactated ringers infusion   Intravenous Continuous Myrtie Soman, MD 10 mL/hr at 09/18/18 0900    . vancomycin (VANCOCIN) IVPB 1000 mg/200 mL premix  1,000 mg Intravenous 60 min Pre-Op Serafina Mitchell, MD 200 mL/hr at 09/18/18 0910 1,000 mg at 09/18/18 0910    REVIEW OF SYSTEMS:   [X]  denotes positive finding, [ ]  denotes negative finding Cardiac  Comments:  Chest pain or chest pressure:    Shortness of breath upon exertion:    Short of breath when lying flat:    Irregular heart rhythm:    Constitutional    Fever or chills:      PHYSICAL EXAM:   Vitals:   09/18/18 0843 09/18/18 0915  BP: (!) 152/92   Pulse: 84   Temp: 97.6 F (36.4 C)   TempSrc: Oral   SpO2: 100%   Weight:  73.7 kg  Height:  6\' 2"  (1.88 m)    GENERAL: The patient is a well-nourished male, in no acute distress. The vital signs are documented above. CARDIOVASCULAR: There is a regular rate and rhythm. PULMONARY: Non-labored respirations Toe ulcer, left    MEDICAL ISSUES:   I discussed our only option for limb slavage is femoral endarterectomy and below knee popliteal bypass.  I discussed the details of the surgery as well as the risks.  He wants to proceed  Leia Alf, MD,  FACS [facility] [phone] Pager [phone]

## 2018-09-18 NOTE — Progress Notes (Signed)
Patient arrived to 4E room 09 at this time. Telemetry applied and CCMD notified. V/s and CHG bath complete. Patient oriented to room and how to call nurse with any needs. Will continue to monitor.   Emelda Fear, RN

## 2018-09-18 NOTE — Transfer of Care (Signed)
Immediate Anesthesia Transfer of Care Note  Patient: Mario [name]  Procedure(s) Performed: LEFT ILEO FEMORAL ENDARTERECTOMY WITH VEIN PATCH AND ANGIOPLASTY, POPLITEAL  ENDARTERECTOMY, FEM POP BYPASS (Left Leg Lower)  Patient Location: PACU  Anesthesia Type:General  Level of Consciousness: drowsy and patient cooperative  Airway & Oxygen Therapy: Patient Spontanous Breathing  Post-op Assessment: Report given to RN  Post vital signs: Reviewed and stable  Last Vitals:  Vitals Value Taken Time  BP 122/61 09/18/2018  4:14 PM  Temp    Pulse 87 09/18/2018  4:15 PM  Resp 23 09/18/2018  4:15 PM  SpO2 97 % 09/18/2018  4:15 PM  Vitals shown include unvalidated device data.  Last Pain:  Vitals:   09/18/18 0915  TempSrc:   PainSc: 4       Patients Stated Pain Goal: 3 (88/67/73 7366)  Complications: No apparent anesthesia complications

## 2018-09-18 NOTE — Op Note (Signed)
Patient name: Mario [name] MRN: [id number] DOB: 01/09/1941 Sex: male  09/18/2018 Pre-operative Diagnosis: Left toe ulcer  Post-operative diagnosis:  Same Surgeon:  Annamarie Major Assistants: Laurence Slate Procedure:   #1: Left common femoral to below-knee popliteal artery bypass graft with 6 mm external ring propatent PTFE   #2: Left external iliac, common femoral, profundofemoral endarterectomy with vein patch angioplasty   #3: Left popliteal endarterectomy Anesthesia: General Blood Loss: 150 cc Specimens: None  Findings:  Extensive exophytic calcific plaque from the distal external iliac artery into the common femoral and profundofemoral artery.  I placed a vein patch from the inguinal ligament down to the profundofemoral artery going about 2 cm onto the profundofemoral artery.  I also performed endarterectomy of the below-knee popliteal artery down to the level and going into the anterior tibial and tibioperoneal trunk.  The saphenous vein was not of adequate quality for bypass.  He had a brisk anterior tibial Doppler signal at the end of the procedure  Indications: The patient has a nonhealing wound on his left toe.  He recently underwent angiography which revealed profunda and superficial femoral artery occlusion with reconstitution of the below-knee popliteal artery and single-vessel runoff.  He comes in today for revascularization for limb salvage.  Procedure:  The patient was identified in the holding area and taken to [place] [facility]  The patient was then placed supine on the table. general anesthesia was administered.  The patient was prepped and draped in the usual sterile fashion.  A time out was called and antibiotics were administered.  A longitudinal incision was made in the left groin.  Cautery was used about subcutaneous tissue down to the femoral sheath which was opened sharply.  I exposed the distal external iliac artery, common femoral, the proximal superficial femoral artery  and the profundofemoral artery down to its first major branch.  The artery was heavily circumferentially calcified.  Side branches were also isolated.  I then made a longitudinal medial below-knee incision and expose the popliteal artery.  There was a calcified plaque in the distal popliteal artery at the level of the anterior tibial artery.  I then began dissecting out the saphenous vein.  It was marginal at the below-knee incision.  I then made a more proximal incision in the lower thigh and at this level of the vein was clearly not adequate for bypass.  I then used a long Gore tunneler to create a subsartorial tunnel between the 2 incisions.  The patient was then fully heparinized.  After the heparin circulated, the distal external iliac artery was occluded as well as the side branches and profundofemoral artery branches.  A #11 blade was used to make an arteriotomy in the common femoral artery which was extended longitudinally with Potts scissors.  I opened the arteriotomy down to the profunda main branch.  There was a nearly occlusive exophytic plaque going into the profundofemoral and superficial femoral artery as well as up into the external iliac artery.  Endarterectomy was performed with a Hector [place].  I was able to get a good distal endpoint in the profundofemoral artery and I tacked the distal intima with 7-0 Prolene.  I was unable to reach up into the external iliac artery and remove all of the plaque at this level.  There was excellent inflow at this time.  I used a portion of the saphenous vein that I had exposed in the below-knee incision for patch.  Patch angioplasty was performed  with running 5-0 Prolene beginning at the inguinal ligament going down into the profundofemoral artery for approximately 2 cm.  Prior to completion the appropriate flushing maneuvers were performed and the anastomosis was completed.  Several vein pledgeted sutures were used to assist with hemostasis.  There was  an excellent Doppler signal in the profundofemoral artery at this time.  I then reoccluded the common femoral artery with a 2 vascular clamps and used a #11 blade to make a opening in the vein patch.  The vein anatomy was extended for approximately 1.5 cm.  A 6 mm external ring propatent PTFE graft was brought onto the field and beveled to fit the size of the arteriotomy.  A running anastomosis was then created in a end-to-side fashion with 6-0 Prolene.  Once this was completed the clamps were released and there was excellent pulsatile flow through the graft.  The graft was then reoccluded and brought through the previously created tunnel.  A web roll was placed in the proximal thigh followed by tourniquet.  The leg was exsanguinated with an Esmarch and the tourniquet was taken to in a 50 mm of pressure.  I then used a 11 blade to make an arteriotomy in the below-knee popliteal artery.  I opened the arteriotomy down to the origin of the anterior tibial artery.  There was an exophytic plaque at this level and so endarterectomy was performed going into the anterior tibial and proximal tibioperoneal trunk.  I got a good distal endpoint.  The leg was then extended and the graft was cut the appropriate length.  It was then beveled to fit the size of the arteriotomy.  A running anastomosis was then created with 6-0 Prolene in a end-to-side fashion.  Prior to completion the tourniquet was let down and the appropriate flushing maneuvers were performed.  The anastomosis was then completed.  At this point, the patient had an excellent graft dependent signal in the anterior tibial artery.  50 mg of protamine was then used to reverse the heparin.  Hemostasis was achieved.  The vein harvest incision in the medial thigh was closed with 2 layers of 3-0 Vicryl.  The groin incision was irrigated.  I reapproximated the femoral sheath with 2-0 Vicryl.  The subcutaneous tissue was then closed with another layer of 2 oh and layer 3-0  Vicryl followed by 3-0 Vicryl on the skin.  The below-knee incision was closed by reapproximating the fascia with 2-0 Vicryl, subcutaneous tissue with 2-0 Vicryl and the skin with 4-0 Vicryl.  Dermabond was placed on the incisions.  There were no immediate complications.  The patient was successfully extubated and taken to recovery in stable condition.   Disposition: To PACU stable.   Theotis Burrow, M.D., [facility] [place] Office: [phone] Pager:  [phone]

## 2018-09-19 ENCOUNTER — Inpatient Hospital Stay (HOSPITAL_COMMUNITY): Payer: PPO | Admitting: Anesthesiology

## 2018-09-19 ENCOUNTER — Encounter (HOSPITAL_COMMUNITY)
Admission: RE | Disposition: A | Discharge status: Home / Self Care | Payer: Self-pay | Source: Home / Self Care | Attending: Surgery

## 2018-09-19 ENCOUNTER — Encounter (HOSPITAL_COMMUNITY): Payer: Self-pay | Admitting: *Deleted

## 2018-09-19 ENCOUNTER — Ambulatory Visit (HOSPITAL_COMMUNITY): Admission: RE | Admit: 2018-09-19 | Payer: PPO | Source: Home / Self Care | Admitting: General Surgery

## 2018-09-19 ENCOUNTER — Telehealth: Payer: Self-pay | Admitting: Surgery

## 2018-09-19 DIAGNOSIS — R590 Localized enlarged lymph nodes: Secondary | ICD-10-CM

## 2018-09-19 DIAGNOSIS — C774 Secondary and unspecified malignant neoplasm of inguinal and lower limb lymph nodes: Secondary | ICD-10-CM | POA: Diagnosis not present

## 2018-09-19 DIAGNOSIS — C44722 Squamous cell carcinoma of skin of right lower limb, including hip: Secondary | ICD-10-CM

## 2018-09-19 HISTORY — PX: LYMPH NODE DISSECTION: SHX5087

## 2018-09-19 HISTORY — DX: Localized enlarged lymph nodes: R59.0

## 2018-09-19 LAB — CBC
HCT: 23.6 % — ABNORMAL LOW (ref 39.0–52.0)
Hemoglobin: 7.8 g/dL — ABNORMAL LOW (ref 13.0–17.0)
MCH: 29.3 pg (ref 26.0–34.0)
MCHC: 33.1 g/dL (ref 30.0–36.0)
MCV: 88.7 fL (ref 80.0–100.0)
Platelets: 159 10*3/uL (ref 150–400)
RBC: 2.66 MIL/uL — ABNORMAL LOW (ref 4.22–5.81)
RDW: 14 % (ref 11.5–15.5)
WBC: 6.6 10*3/uL (ref 4.0–10.5)
nRBC: 0 % (ref 0.0–0.2)

## 2018-09-19 LAB — BASIC METABOLIC PANEL
Anion gap: 8 (ref 5–15)
BUN: 28 mg/dL — ABNORMAL HIGH (ref 8–23)
CO2: 21 mmol/L — ABNORMAL LOW (ref 22–32)
Calcium: 8.4 mg/dL — ABNORMAL LOW (ref 8.9–10.3)
Chloride: 108 mmol/L (ref 98–111)
Creatinine, Ser: 1.35 mg/dL — ABNORMAL HIGH (ref 0.61–1.24)
GFR calc Af Amer: 58 mL/min — ABNORMAL LOW (ref >60)
GFR calc non Af Amer: 50 mL/min — ABNORMAL LOW (ref >60)
Glucose, Bld: 231 mg/dL — ABNORMAL HIGH (ref 70–99)
Potassium: 4.1 mmol/L (ref 3.5–5.1)
Sodium: 137 mmol/L (ref 135–145)

## 2018-09-19 LAB — GLUCOSE, CAPILLARY
Glucose-Capillary: 213 mg/dL — ABNORMAL HIGH (ref 70–99)
Glucose-Capillary: 174 mg/dL — ABNORMAL HIGH (ref 70–99)
Glucose-Capillary: 174 mg/dL — ABNORMAL HIGH (ref 70–99)
Glucose-Capillary: 175 mg/dL — ABNORMAL HIGH (ref 70–99)
Glucose-Capillary: 201 mg/dL — ABNORMAL HIGH (ref 70–99)
Glucose-Capillary: 391 mg/dL — ABNORMAL HIGH (ref 70–99)
Glucose-Capillary: 341 mg/dL — ABNORMAL HIGH (ref 70–99)

## 2018-09-19 SURGERY — LYMPH NODE DISSECTION
Anesthesia: General | Laterality: Right

## 2018-09-19 MED ORDER — LACTATED RINGERS IV SOLN
INTRAVENOUS | Status: DC | PRN
  Administered 2018-09-19: 11:00:00 via INTRAVENOUS

## 2018-09-19 MED ORDER — HYDROMORPHONE HCL 1 MG/ML IJ SOLN: 0.2500 mg | INTRAMUSCULAR | Status: DC | PRN

## 2018-09-19 MED ORDER — 0.9 % SODIUM CHLORIDE (POUR BTL) OPTIME
TOPICAL | Status: DC | PRN
  Administered 2018-09-19: 1000 mL

## 2018-09-19 MED ORDER — BUPIVACAINE-EPINEPHRINE (PF) 0.5% -1:200000 IJ SOLN
INTRAMUSCULAR | Status: DC | PRN
  Administered 2018-09-19: 50 mL via PERINEURAL

## 2018-09-19 MED ORDER — GABAPENTIN 300 MG PO CAPS
300.0000 mg | ORAL_CAPSULE | ORAL | Status: AC
  Administered 2018-09-19: 11:00:00 300 mg via ORAL

## 2018-09-19 MED ORDER — VANCOMYCIN HCL IN DEXTROSE 1-5 GM/200ML-% IV SOLN
1000.0000 mg | INTRAVENOUS | Status: AC
  Administered 2018-09-19: 11:00:00 1000 mg via INTRAVENOUS

## 2018-09-19 MED ORDER — ACETAMINOPHEN 500 MG PO TABS
ORAL_TABLET | ORAL | Status: AC
  Administered 2018-09-19: 1000 mg via ORAL
  Filled 2018-09-19: qty 2

## 2018-09-19 MED ORDER — VANCOMYCIN HCL IN DEXTROSE 1-5 GM/200ML-% IV SOLN
INTRAVENOUS | Status: AC
  Administered 2018-09-19: 1000 mg via INTRAVENOUS
  Filled 2018-09-19: qty 200

## 2018-09-19 MED ORDER — SODIUM CHLORIDE 0.9 % IV SOLN
INTRAVENOUS | Status: DC
  Administered 2018-09-19 – 2018-09-21 (×3): via INTRAVENOUS

## 2018-09-19 MED ORDER — CHLORHEXIDINE GLUCONATE CLOTH 2 % EX PADS: 6.0000 | MEDICATED_PAD | Freq: Once | CUTANEOUS | Status: DC

## 2018-09-19 MED ORDER — PROPOFOL 10 MG/ML IV BOLUS
INTRAVENOUS | Status: DC | PRN
  Administered 2018-09-19: 175 mg via INTRAVENOUS

## 2018-09-19 MED ORDER — GABAPENTIN 300 MG PO CAPS
ORAL_CAPSULE | ORAL | Status: AC
  Administered 2018-09-19: 300 mg via ORAL
  Filled 2018-09-19: qty 1

## 2018-09-19 MED ORDER — BUPIVACAINE-EPINEPHRINE 0.5% -1:200000 IJ SOLN
INTRAMUSCULAR | Status: AC
  Filled 2018-09-19: qty 1

## 2018-09-19 MED ORDER — DEXAMETHASONE SODIUM PHOSPHATE 10 MG/ML IJ SOLN
INTRAMUSCULAR | Status: DC | PRN
  Administered 2018-09-19: 5 mg via INTRAVENOUS

## 2018-09-19 MED ORDER — PHENYLEPHRINE HCL (PRESSORS) 10 MG/ML IV SOLN
INTRAVENOUS | Status: DC | PRN
  Administered 2018-09-19: 80 ug via INTRAVENOUS
  Administered 2018-09-19: 120 ug via INTRAVENOUS

## 2018-09-19 MED ORDER — VANCOMYCIN HCL IN DEXTROSE 1-5 GM/200ML-% IV SOLN
1000.0000 mg | Freq: Once | INTRAVENOUS | Status: AC
  Administered 2018-09-19: 1000 mg via INTRAVENOUS
  Filled 2018-09-19: qty 200

## 2018-09-19 MED ORDER — LACTATED RINGERS IV SOLN
INTRAVENOUS | Status: DC
  Administered 2018-09-19: 11:00:00 via INTRAVENOUS

## 2018-09-19 MED ORDER — FENTANYL CITRATE (PF) 100 MCG/2ML IJ SOLN
INTRAMUSCULAR | Status: DC | PRN
  Administered 2018-09-19: 25 ug via INTRAVENOUS
  Administered 2018-09-19: 125 ug via INTRAVENOUS
  Administered 2018-09-19: 25 ug via INTRAVENOUS

## 2018-09-19 MED ORDER — CEFAZOLIN SODIUM-DEXTROSE 2-4 GM/100ML-% IV SOLN: 2.0000 g | Freq: Three times a day (TID) | INTRAVENOUS | Status: DC

## 2018-09-19 MED ORDER — HEPARIN SODIUM (PORCINE) 5000 UNIT/ML IJ SOLN
5000.0000 [IU] | Freq: Three times a day (TID) | INTRAMUSCULAR | Status: DC
  Administered 2018-09-21 (×2): 5000 [IU] via SUBCUTANEOUS
  Filled 2018-09-19 (×4): qty 1

## 2018-09-19 MED ORDER — EPHEDRINE SULFATE 50 MG/ML IJ SOLN
INTRAMUSCULAR | Status: DC | PRN
  Administered 2018-09-19: 10 mg via INTRAVENOUS

## 2018-09-19 MED ORDER — ACETAMINOPHEN 500 MG PO TABS
1000.0000 mg | ORAL_TABLET | ORAL | Status: AC
  Administered 2018-09-19: 11:00:00 1000 mg via ORAL

## 2018-09-19 MED ORDER — MIGLITOL 50 MG PO TABS
50.0000 mg | ORAL_TABLET | Freq: Two times a day (BID) | ORAL | Status: DC
  Administered 2018-09-19 – 2018-09-22 (×6): 50 mg via ORAL
  Filled 2018-09-19 (×4): qty 1

## 2018-09-19 MED ORDER — LIDOCAINE 2% (20 MG/ML) 5 ML SYRINGE
INTRAMUSCULAR | Status: DC | PRN
  Administered 2018-09-19: 60 mg via INTRAVENOUS

## 2018-09-19 SURGICAL SUPPLY — 34 items
ANCHOR CATH FOLEY SECURE (MISCELLANEOUS) ×1 IMPLANT
APPLIER CLIP 9.375 MED OPEN (MISCELLANEOUS) ×4
CHLORAPREP W/TINT 26ML (MISCELLANEOUS) ×2 IMPLANT
CLIP APPLIE 9.375 MED OPEN (MISCELLANEOUS) ×1 IMPLANT
CONT SPEC 4OZ CLIKSEAL STRL BL (MISCELLANEOUS) ×1 IMPLANT
COVER SURGICAL LIGHT HANDLE (MISCELLANEOUS) ×2 IMPLANT
COVER WAND RF STERILE (DRAPES) ×2 IMPLANT
DERMABOND ADVANCED (GAUZE/BANDAGES/DRESSINGS) ×1
DERMABOND ADVANCED .7 DNX12 (GAUZE/BANDAGES/DRESSINGS) ×1 IMPLANT
DRAIN CHANNEL 19F RND (DRAIN) ×2 IMPLANT
DRAPE CHEST BREAST 15X10 FENES (DRAPES) ×2 IMPLANT
ELECT REM PT RETURN 9FT ADLT (ELECTROSURGICAL) ×2
ELECTRODE REM PT RTRN 9FT ADLT (ELECTROSURGICAL) ×1 IMPLANT
EVACUATOR SILICONE 100CC (DRAIN) ×2 IMPLANT
GAUZE SPONGE 4X4 12PLY STRL (GAUZE/BANDAGES/DRESSINGS) ×2 IMPLANT
GLOVE EUDERMIC 7 POWDERFREE (GLOVE) ×2 IMPLANT
GOWN STRL REUS W/ TWL LRG LVL3 (GOWN DISPOSABLE) ×2 IMPLANT
GOWN STRL REUS W/TWL LRG LVL3 (GOWN DISPOSABLE) ×2
KIT BASIN OR (CUSTOM PROCEDURE TRAY) ×2 IMPLANT
KIT TURNOVER KIT B (KITS) ×2 IMPLANT
NDL HYPO 25GX1X1/2 BEV (NEEDLE) IMPLANT
NEEDLE HYPO 25GX1X1/2 BEV (NEEDLE) IMPLANT
NS IRRIG 1000ML POUR BTL (IV SOLUTION) ×2 IMPLANT
PACK GENERAL/GYN (CUSTOM PROCEDURE TRAY) ×2 IMPLANT
PAD ARMBOARD 7.5X6 YLW CONV (MISCELLANEOUS) ×2 IMPLANT
PENCIL SMOKE EVACUATOR (MISCELLANEOUS) ×2 IMPLANT
SUT ETHILON 2 0 FS 18 (SUTURE) ×2 IMPLANT
SUT MON AB 4-0 PC3 18 (SUTURE) ×2 IMPLANT
SUT SILK 2 0 (SUTURE) ×1
SUT SILK 2-0 18XBRD TIE 12 (SUTURE) IMPLANT
SUT VIC AB 3-0 SH 18 (SUTURE) ×2 IMPLANT
TAPE CLOTH SURG 4X10 WHT LF (GAUZE/BANDAGES/DRESSINGS) ×1 IMPLANT
TOWEL OR 17X24 6PK STRL BLUE (TOWEL DISPOSABLE) ×2 IMPLANT
TOWEL OR 17X26 10 PK STRL BLUE (TOWEL DISPOSABLE) ×2 IMPLANT

## 2018-09-19 NOTE — Telephone Encounter (Signed)
-----   Message from Ulyses Amor, [place] sent at 09/18/2018  4:30 PM EDT -----  S/p fem pop bypass needs Duplex of bypass and ABI f/u with Dr. [name] in 23 weeks

## 2018-09-19 NOTE — Progress Notes (Signed)
Patient arrived to 4E room 09 at this time from PACU. Telemetry applied. V/s and assessment complete. Will continue to monitor.  Emelda Fear, RN

## 2018-09-19 NOTE — Progress Notes (Signed)
Report called to [place], RN in the OR.

## 2018-09-19 NOTE — Transfer of Care (Signed)
Immediate Anesthesia Transfer of Care Note  Patient: Mario [name]  Procedure(s) Performed: SUPERFICIAL RIGHT LYMPH NODE DISSECTION (Right )  Patient Location: PACU  Anesthesia Type:General  Level of Consciousness: awake, alert  and oriented  Airway & Oxygen Therapy: Patient Spontanous Breathing and Patient connected to face mask oxygen  Post-op Assessment: Report given to RN, Post -op Vital signs reviewed and stable and Patient moving all extremities X 4  Post vital signs: Reviewed and stable  Last Vitals:  Vitals Value Taken Time  BP 141/62 09/19/2018 12:12 PM  Temp    Pulse 75 09/19/2018 12:13 PM  Resp 14 09/19/2018 12:13 PM  SpO2 100 % 09/19/2018 12:13 PM  Vitals shown include unvalidated device data.  Last Pain:  Vitals:   09/19/18 0757  TempSrc: Oral  PainSc:       Patients Stated Pain Goal: 3 (08/65/78 4696)  Complications: No apparent anesthesia complications

## 2018-09-19 NOTE — Progress Notes (Signed)
PT Cancellation Note  Patient Details Name: Mario [name] MRN: [id number] DOB: 08-21-1940   Cancelled Treatment:    Reason Eval/Treat Not Completed: Patient at procedure or test/unavailable.  Gone to the OR for biopsy.  Hopefully will be able to see Mr Trudell later today as able. 09/19/2018  Donnella Sham, [place] [phone]  (pager) [phone]  (office)   Tessie Fass Lean Fayson 09/19/2018, 11:18 AM

## 2018-09-19 NOTE — Anesthesia Procedure Notes (Signed)
Procedure Name: LMA Insertion Date/Time: 09/19/2018 11:10 AM Performed by: Neldon Newport, CRNA Pre-anesthesia Checklist: Timeout performed, Patient being monitored, Suction available, Emergency Drugs available and Patient identified Patient Re-evaluated:Patient Re-evaluated prior to induction Oxygen Delivery Method: Circle system utilized Preoxygenation: Pre-oxygenation with 100% oxygen Induction Type: IV induction LMA: LMA inserted LMA Size: 5.0 Number of attempts: 1 Placement Confirmation: breath sounds checked- equal and bilateral and positive ETCO2 Tube secured with: Tape Dental Injury: Teeth and Oropharynx as per pre-operative assessment

## 2018-09-19 NOTE — Progress Notes (Signed)
OT Cancellation Note  Patient Details Name: Mario [name] MRN: [id number] DOB: 12-13-1940   Cancelled Treatment:    Reason Eval/Treat Not Completed: Patient at procedure or test/ unavailable(Pt off the floor, in [place])  [place] 09/19/2018, 12:16 PM   Hulda Humphrey OTR/L Acute Rehabilitation Services Pager: [phone] Office: [phone]

## 2018-09-19 NOTE — Anesthesia Postprocedure Evaluation (Signed)
Anesthesia Post Note  Patient: Mario [name]  Procedure(s) Performed: SUPERFICIAL RIGHT LYMPH NODE DISSECTION (Right )     Patient location during evaluation: PACU Anesthesia Type: General Level of consciousness: awake Pain management: pain level controlled Vital Signs Assessment: post-procedure vital signs reviewed and stable Respiratory status: spontaneous breathing Cardiovascular status: stable Postop Assessment: no apparent nausea or vomiting Anesthetic complications: no    Last Vitals:  Vitals:   09/19/18 1700 09/19/18 1709  BP:  116/66  Pulse: 88 87  Resp: 11 19  Temp:    SpO2: 99% 98%    Last Pain:  Vitals:   09/19/18 1453  TempSrc:   PainSc: 0-No pain                 Brandell Maready

## 2018-09-19 NOTE — Anesthesia Preprocedure Evaluation (Signed)
Anesthesia Evaluation  Patient identified by MRN, date of birth, ID band Patient awake    Reviewed: Allergy & Precautions, NPO status , Patient's Chart, lab work & pertinent test results  Airway Mallampati: II  TM Distance: >3 FB     Dental   Pulmonary pneumonia, former smoker,    breath sounds clear to auscultation       Cardiovascular hypertension, + CAD, + Peripheral Vascular Disease and +CHF   Rhythm:Regular Rate:Normal     Neuro/Psych    GI/Hepatic negative GI ROS, Neg liver ROS,   Endo/Other  diabetes  Renal/GU Renal disease     Musculoskeletal   Abdominal   Peds  Hematology   Anesthesia Other Findings   Reproductive/Obstetrics                             Anesthesia Physical Anesthesia Plan  ASA: III  Anesthesia Plan: General   Post-op Pain Management:    Induction: Intravenous  PONV Risk Score and Plan: 2 and Ondansetron, Dexamethasone and Midazolam  Airway Management Planned: LMA and Oral ETT  Additional Equipment:   Intra-op Plan:   Post-operative Plan: Extubation in OR  Informed Consent: I have reviewed the patients History and Physical, chart, labs and discussed the procedure including the risks, benefits and alternatives for the proposed anesthesia with the patient or authorized representative who has indicated his/her understanding and acceptance.     Dental advisory given  Plan Discussed with: CRNA and Anesthesiologist  Anesthesia Plan Comments:         Anesthesia Quick Evaluation

## 2018-09-19 NOTE — Evaluation (Signed)
Physical Therapy Evaluation Patient Details Name: Mario [name] MRN: [id number] DOB: 20-Oct-1940 Today's Date: 09/19/2018   History of Present Illness  Pt is a 78 y/o male  with h/o DM2, PAD, iCM, CHF, CAD admitted for vascular surgery due to Left 2nd toe non-healing ulcer, leg cramping, L foot ischemic pain, PAOD and right groind lymph node CA.  4/28 Left fem pop BPG, L ext iliac, common femoral, frofundofemoral endarterectomy, L pop endarterectomy.  4/29 right superficial inguinal lymph node dissection.  Clinical Impression  Pt admitted with/for L LE BPG ing.  Presently pt's leg/groin pain limits ambulation and pt is also mobilizing with minimal assist.  I expect this to improve quickly.  Pt currently limited functionally due to the problems listed below.  (see problems list.)  Pt will benefit from PT to maximize function and safety to be able to get home safely with available assist .     Follow Up Recommendations No PT follow up;Supervision - Intermittent    Equipment Recommendations  Rolling walker with 5" wheels    Recommendations for Other Services       Precautions / Restrictions Precautions Precautions: Fall      Mobility  Bed Mobility Overal bed mobility: Modified Independent                Transfers Overall transfer level: Needs assistance   Transfers: Sit to/from Stand Sit to Stand: Min guard         General transfer comment: pt moving slowly and painfully to stand  Ambulation/Gait Ambulation/Gait assistance: Min guard Gait Distance (Feet): 20 Feet Assistive device: Rolling walker (2 wheeled) Gait Pattern/deviations: Step-through pattern Gait velocity: slow   General Gait Details: antalgic in nature, staying much too far behind the RW and outside the RW during turns.  Stairs            Wheelchair Mobility    Modified Rankin (Stroke Patients Only)       Balance Overall balance assessment: Needs assistance   Sitting balance-Leahy  Scale: Good       Standing balance-Leahy Scale: Fair Standing balance comment: though mostly fair static due to pain in the L LE                             Pertinent Vitals/Pain Pain Assessment: Faces Faces Pain Scale: Hurts even more Pain Location: L LE Pain Descriptors / Indicators: Burning;Grimacing;Discomfort Pain Intervention(s): Monitored during session    Home Living Family/patient expects to be discharged to:: Private residence Living Arrangements: Spouse/significant other Available Help at Discharge: Family;Available 24 hours/day Type of Home: House Home Access: Level entry     Home Layout: One level Home Equipment: Shower seat - built in;Cane - single point      Prior Function Level of Independence: Independent         Comments: played pickle ball regularly.  Got cramps in his L leg after about 20 min.     Hand Dominance        Extremity/Trunk Assessment   Upper Extremity Assessment Upper Extremity Assessment: Overall WFL for tasks assessed    Lower Extremity Assessment Lower Extremity Assessment: Overall WFL for tasks assessed;LLE deficits/detail LLE Deficits / Details: moves against gravity, but with pain. LLE Coordination: decreased fine motor       Communication   Communication: No difficulties  Cognition Arousal/Alertness: Awake/alert Behavior During Therapy: WFL for tasks assessed/performed Overall Cognitive Status: Within Functional Limits for tasks  assessed                                        General Comments General comments (skin integrity, edema, etc.): VSS    Exercises     Assessment/Plan    PT Assessment Patient needs continued PT services  PT Problem List         PT Treatment Interventions Gait training;DME instruction;Stair training;Functional mobility training;Therapeutic activities;Therapeutic exercise;Neuromuscular re-education    PT Goals (Current goals can be found in the Care  Plan section)  Acute Rehab PT Goals Patient Stated Goal: home with wife's assist as needed PT Goal Formulation: With patient Time For Goal Achievement: 10/24/18    Frequency Min 3X/week   Barriers to discharge        Co-evaluation               AM-PAC PT "6 Clicks" Mobility  Outcome Measure Help needed turning from your back to your side while in a flat bed without using bedrails?: None Help needed moving from lying on your back to sitting on the side of a flat bed without using bedrails?: None Help needed moving to and from a bed to a chair (including a wheelchair)?: A Little Help needed standing up from a chair using your arms (e.g., wheelchair or bedside chair)?: A Little Help needed to walk in hospital room?: None Help needed climbing 3-5 steps with a railing? : A Little 6 Click Score: 21    End of Session   Activity Tolerance: Patient tolerated treatment well;Patient limited by pain Patient left: in bed;with call bell/phone within reach;with bed alarm set Nurse Communication: Mobility status PT Visit Diagnosis: Unsteadiness on feet (R26.81);Other abnormalities of gait and mobility (R26.89);Difficulty in walking, not elsewhere classified (R26.2)    Time: 1834-3735 PT Time Calculation (min) (ACUTE ONLY): 30 min   Charges:   PT Evaluation $PT Eval Moderate Complexity: 1 Mod PT Treatments $Gait Training: 8-22 mins        09/19/2018  Donnella Sham, PT Acute Rehabilitation Services [phone]  (pager) [phone]  (office)  Tessie Fass Andraya Frigon 09/19/2018, 6:51 PM

## 2018-09-19 NOTE — Progress Notes (Signed)
Foley discontinued at this time. Patient tolerated well.   Emelda Fear, RN

## 2018-09-19 NOTE — Interval H&P Note (Signed)
History and Physical Interval Note:  09/19/2018 10:29 AM  Mario [name]  has presented today for surgery, with the diagnosis of LYMPHADENOPATHY.  The various methods of treatment have been discussed with the patient and family. After consideration of risks, benefits and other options for treatment, the patient has consented to  Procedure(s): SUPERFICIAL RIGHT INGUINAL LYMPH NODE DISSECTION (Right) as a surgical intervention.  The patient's history has been reviewed, patient examined, no change in status, stable for surgery.  I have reviewed the patient's chart and labs.  Questions were answered to the patient's satisfaction.     Adin Hector

## 2018-09-19 NOTE — Op Note (Signed)
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 Dr. [name] [name] is his PCP. His cardiologist is Dr. Daneen [name]. His vascular surgeon is Dr. Trula [name]. His Mohs surgeon is Dr. Link [name]. His pulmonary medicine doctor is  Dr. Melvyn [name].        Initially presented with a painless mass in his right groin without other symptoms. 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 Image guided core guided biopsy of this mass showed squamous cell carcinoma. PET scan shows hypermetabolism in the left upper lobe of the lung worrisome for primary bronchogenic carcinoma. Hypermetabolic right inguinal lymph node. No additional sites of hypermetabolic disease. 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 Left upper lobe airspace opacity. 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 Pulmonary medicine thinks that he ultimately might need to have the left upper lobe mass removed surgically. IR did not feel they could biopsy this. Dr. Melvyn [name] did not feel that this was urgent. He is referred back to do the lymph node dissection in the right groin.       Comorbidities include coronary artery disease. CABG 2016 by Dr. Cyndia [name]. . No shortness of breath or chest pain. Type 2 diabetes with CK D. Hypertension. Venous and arterial  insufficiency.      It is notable that he had to have a urgent left femoral to popliteal bypass surgery yesterday by Dr. Trula [name].  They had attempted angioplasty through the right groin but that was unsuccessful.  He is doing well postop day 1 from his vascular surgery and we decided to proceed with his right inguinal lymph node dissection. . The surgery has been discussed with the patient, Dr. Alen [name], cancer conference team.. We do not feel that he needs an extensive superficial and deep dissection for this disease. I described the procedure to him. He understands the indications, details, techniques, and risks of this surgery in detail. Described the risk of bleeding, infection, seroma, lymphocele, nerve damage, recurrence. He understands all these issues. All his questions are answered. He agrees with this plan.  Marland Kitchen  Operative Findings:       We took out all of the inguinal nodes and lymphatic tissue from anterior and below the right inguinal ligament down to  the inferior aspect of the right femoral triangle.  There was one clearly enlarged lymph node and a second lymph node that was somewhat enlarged and firm.  There was no palpable mass after we removed the lymph node packet.  Procedure in Detail:          Following the induction of general LMA anesthesia the patient's lower abdomen right groin and genitalia and upper thigh were prepped and draped in a sterile fashion.  Surgical timeout was performed.  Intravenous antibiotics were given.  0.5% Marcaine with epinephrine was used as local infiltration anesthetic.  A linear oblique incision was made in the right groin about 2 cm below the inguinal ligament.  Dissection was carried down through the subcutaneous tissue and Scarpa's fascia.  I then dissected out the entire superficial lymph node packet from medial to lateral.  There was some enlarged veins and lymphatics which were controlled with 2-0 silk ties and the smaller ones were controlled  with metal clips.  We took the entire dissection down to the muscle fascia.  The specimen was sent fresh to the pathology lab.  Hemostasis was excellent and had been achieved with electrocautery metal clips and silk ties.  The wound was irrigated.  The deeper tissues were closed with interrupted sutures of 3-0 Vicryl trying to close down the space.  The skin was closed with a running subcuticular 4-0 Monocryl and Dermabond.  The patient tolerated the procedure well and was taken to PACU in stable condition.  EBL 15 cc.  Counts correct.  Complications none.     Mario [name], M.D., FACS General and Minimally Invasive Surgery Breast and Colorectal Surgery  09/19/2018 11:55 AM

## 2018-09-19 NOTE — Progress Notes (Signed)
    Subjective  - POD #1, s/p left femoral and popliteal endaterectomy and left fem BK pop BPG with PTFE  Has some soreness in left leg this am   Physical Exam:  Palpable left DP Incisions look good.  No hematoma       Assessment/Plan:  POD #1  I want him to go home on Plavix TO OR with Dr. Dalbert Batman today Mobilize with PT /OT, hopefully home Friday / Saturday Acute blood loss anemia:  Hb 7.8 this am, will need to monitor and transfuse if necessary, given age and co-morbidities.  Repeat CBC in am DM:  Continue PO meds and SSI  Mario [name] 09/19/2018 8:18 AM --  Vitals:   09/19/18 0757 09/19/18 0800  BP: 138/70 138/70  Pulse: 77 78  Resp: 15 18  Temp: 97.8 F (36.6 C)   SpO2: 100% 100%    Intake/Output Summary (Last 24 hours) at 09/19/2018 0818 Last data filed at 09/19/2018 0647 Gross per 24 hour  Intake 2472.67 ml  Output 1930 ml  Net 542.67 ml     Laboratory CBC    Component Value Date/Time   WBC 6.6 09/19/2018 0347   HGB 7.8 (L) 09/19/2018 0347   HCT 23.6 (L) 09/19/2018 0347   PLT 159 09/19/2018 0347    BMET    Component Value Date/Time   NA 137 09/19/2018 0347   K 4.1 09/19/2018 0347   CL 108 09/19/2018 0347   CO2 21 (L) 09/19/2018 0347   GLUCOSE 231 (H) 09/19/2018 0347   BUN 28 (H) 09/19/2018 0347   CREATININE 1.35 (H) 09/19/2018 0347   CREATININE 1.14 08/13/2013 1043   CALCIUM 8.4 (L) 09/19/2018 0347   GFRNONAA 50 (L) 09/19/2018 0347   GFRAA 58 (L) 09/19/2018 0347    COAG Lab Results  Component Value Date   INR 1.1 09/13/2018   INR 1.02 06/22/2018   INR 1.51 (H) 11/03/2014   No results found for: PTT  Antibiotics Anti-infectives (From admission, onward)   Start     Dose/Rate Route Frequency Ordered Stop   09/18/18 0844  vancomycin (VANCOCIN) IVPB 1000 mg/200 mL premix     1,000 mg 200 mL/hr over 60 Minutes Intravenous 60 min pre-op 09/18/18 0844 09/18/18 1010       V. Leia Alf, M.D., [facility] [place] Office: [phone] Pager:  [phone]

## 2018-09-19 NOTE — Plan of Care (Signed)
  Problem: Safety Goal: Knowledge of Patient Safety will improve 09/19/2018 0247 by Rufina Falco, RN Outcome: Progressing

## 2018-09-19 NOTE — Progress Notes (Signed)
Inpatient Diabetes Program Recommendations  AACE/ADA: New Consensus Statement on Inpatient Glycemic Control (2015)  Target Ranges:  Prepandial:   less than 140 mg/dL      Peak postprandial:   less than 180 mg/dL (1-2 hours)      Critically ill patients:  140 - 180 mg/dL   Results for COLA, HIGHFILL (MRN [id number]) as of 09/19/2018 14:14  Ref. Range 09/19/2018 03:12 09/19/2018 06:12 09/19/2018 07:55 09/19/2018 10:14 09/19/2018 12:13  Glucose-Capillary Latest Ref Range: 70 - 99 mg/dL 213 (H) 174 (H) 174 (H) 175 (H) 201 (H)    Review of Glycemic Control  Diabetes history: DM 2 Outpatient Diabetes medications: Metformin 1000 mg BID, Glipizide 10 mg qhs, Miglitol 50 mg BID, Victoza 1.8 mg qday @ supper  Current orders for Inpatient glycemic control: Home meds listed + Novolog 0-9 units tid  A1c 7.1 on 3/23  Inpatient Diabetes Program Recommendations:    Patient from OR. Has received 2 doses of Decadron 5 mg. Glucose trends in the 200's. Home meds ordered for glycemic control in addition to Novolog 0-9 Correction tid.  Please consider increasing correction scale to Novolog Moderate 0-15 units and add Novolog 0-5 units qhs.  Thanks,  Tama Headings RN, MSN, BC-ADM Inpatient Diabetes Coordinator Team Pager [phone] (8a-5p)

## 2018-09-19 NOTE — Telephone Encounter (Signed)
sch appt lvm mld ltr 10/01/2018 10am ABI 11am LE bypass graph 12pm p/o MD

## 2018-09-20 ENCOUNTER — Encounter (HOSPITAL_COMMUNITY): Payer: Self-pay | Admitting: General Surgery

## 2018-09-20 LAB — GLUCOSE, CAPILLARY
Glucose-Capillary: 162 mg/dL — ABNORMAL HIGH (ref 70–99)
Glucose-Capillary: 244 mg/dL — ABNORMAL HIGH (ref 70–99)
Glucose-Capillary: 242 mg/dL — ABNORMAL HIGH (ref 70–99)
Glucose-Capillary: 199 mg/dL — ABNORMAL HIGH (ref 70–99)

## 2018-09-20 LAB — CBC
HCT: 23.2 % — ABNORMAL LOW (ref 39.0–52.0)
Hemoglobin: 7.4 g/dL — ABNORMAL LOW (ref 13.0–17.0)
MCH: 28.2 pg (ref 26.0–34.0)
MCHC: 31.9 g/dL (ref 30.0–36.0)
MCV: 88.5 fL (ref 80.0–100.0)
Platelets: 157 10*3/uL (ref 150–400)
RBC: 2.62 MIL/uL — ABNORMAL LOW (ref 4.22–5.81)
RDW: 13.9 % (ref 11.5–15.5)
WBC: 8.7 10*3/uL (ref 4.0–10.5)
nRBC: 0 % (ref 0.0–0.2)

## 2018-09-20 MED ORDER — SODIUM CHLORIDE 0.9% IV SOLUTION: Freq: Once | INTRAVENOUS | Status: DC

## 2018-09-20 NOTE — Progress Notes (Addendum)
Surgery:   Doing well POD #1 following right inguinal lymph node dissection.  I discussed operative findings with him. Foley out.  Starting to work with PT Right groin incision looks good.  No hematoma or seroma at this point.  Continue with therapies May be discharged from my standpoint when he has recovered from his left leg revascularization procedure I will check on his right inguinal pathology and let him know When discharged, he should follow-up with me in the office in about 2-3 weeks (reminder entered into AVS)   Edsel Petrin. Dalbert Batman, M.D., [facility] General and Minimally invasive Surgery Breast and Colorectal Surgery Office:   [phone] Pager:   [phone]

## 2018-09-20 NOTE — Progress Notes (Addendum)
[facility] [place]  Subjective  - He is comfortable and states the compression on his lower legs feels good.   Objective (!) 151/68 84 97.7 F (36.5 C) (Oral) (!) 24 99%  Intake/Output Summary (Last 24 hours) at 09/20/2018 0758 Last data filed at 09/20/2018 7425 Gross per 24 hour  Intake 1562.86 ml  Output 1430 ml  Net 132.86 ml    Right LE doppler DP, Left DP signal Left second toe tip with ischemic changes open to air Left LE incisions healing well groins soft Heart RRR Lungs non labored breathing  Assessment/Planning: POD # 2  Procedure:   #1: Left common femoral to below-knee popliteal artery bypass graft with 6 mm external ring propatent PTFE                         #2: Left external iliac, common femoral, profundofemoral endarterectomy with vein patch angioplasty                         #3: Left popliteal endarterectomy POD# 1 Right superficial inguinal lymph node dissection by Dr. Dalbert Batman  PT does not recommend HH.   HGB 7.4 will transfuse 1 unit PRBC Possible discharge tomorrow.     Roxy Horseman 09/20/2018 7:58 AM --  Laboratory Lab Results: Recent Labs    09/19/18 0347 09/20/18 0317  WBC 6.6 8.7  HGB 7.8* 7.4*  HCT 23.6* 23.2*  PLT 159 157   BMET Recent Labs    09/18/18 1841 09/19/18 0347  NA  --  137  K  --  4.1  CL  --  108  CO2  --  21*  GLUCOSE  --  231*  BUN  --  28*  CREATININE 1.42* 1.35*  CALCIUM  --  8.4*    COAG Lab Results  Component Value Date   INR 1.1 09/13/2018   INR 1.02 06/22/2018   INR 1.51 (H) 11/03/2014   No results found for: PTT   I have independently interviewed and examined patient and agree with PA assessment and plan above.   Gates Jividen C. [name], MD [facility] [place] Office: [phone] Pager: [phone]

## 2018-09-20 NOTE — Progress Notes (Addendum)
Inpatient Diabetes Program Recommendations  AACE/ADA: New Consensus Statement on Inpatient Glycemic Control (2015)  Target Ranges:  Prepandial:   less than 140 mg/dL      Peak postprandial:   less than 180 mg/dL (1-2 hours)      Critically ill patients:  140 - 180 mg/dL   Lab Results  Component Value Date   GLUCAP 244 (H) 09/20/2018   HGBA1C 7.1 (H) 08/13/2018    Review of Glycemic Control Results for Mario [name], Mario [name] (MRN [id number]) as of 09/20/2018 14:32  Ref. Range 09/19/2018 10:14 09/19/2018 12:13 09/19/2018 16:12 09/19/2018 21:01 09/20/2018 06:10 09/20/2018 11:06  Glucose-Capillary Latest Ref Range: 70 - 99 mg/dL 175 (H) 201 (H) 391 (H) 341 (H) 162 (H) 244 (H)   Diabetes history: DM 2 Outpatient Diabetes medications:  Glucotrol 10 mg daily, Metformin 1000 mg bid, Glyset 50 mg bid, Victoza 1.8 mg daily Current orders for Inpatient glycemic control:  Novolog sensitive tid with meals, Glucotrol 10 mg daily, Victoza 1.8 mg daily, Metformin 1000 mg bid  Inpatient Diabetes Program Recommendations:    May consider holding Glucotrol, Victoza and Metformin while patient in the hospital. Consider increasing Novolog correction to moderate tid with meals.  Also may consider adding Lantus 10 units daily while in the hospital.   Thanks,  Adah Perl, RN, BC-ADM Inpatient Diabetes Coordinator Pager [phone] (8a-5p)

## 2018-09-20 NOTE — Evaluation (Signed)
Occupational Therapy Evaluation Patient Details Name: Mario [name] MRN: [id number] DOB: 1940-08-13 Today's Date: 09/20/2018    History of Present Illness Pt is a 78 y/o male  with h/o DM2, PAD, iCM, CHF, CAD admitted for vascular surgery due to Left 2nd toe non-healing ulcer, leg cramping, L foot ischemic pain, PAOD and right groin lymph node CA.  4/28 Left fem pop BPG, L ext iliac, common femoral, frofundofemoral endarterectomy, L pop endarterectomy.  4/29 right superficial inguinal lymph node dissection.   Clinical Impression   PTA Pt independent in ADL and mobility with increasing need for DME. Today Pt in increased pain but still willing to work with therapy. PT able to demonstrate transfers at min guard with RW from elevated surfaces, and mod A for LB ADL, min guard for standing grooming tasks and set up for seated UB ADL. Pt with moment of confusion where he thought he was at home (RN aware) and will benefit from skilled OT in the acute setting as well as afterwards at the [facility] level. We will monitor closely for improvement and potentially not needing home health therapies.     Follow Up Recommendations  Home health OT;Supervision/Assistance - 24 hour(watch for improvement)    Equipment Recommendations  3 in 1 bedside commode    Recommendations for Other Services       Precautions / Restrictions Precautions Precautions: Fall Restrictions Weight Bearing Restrictions: No      Mobility Bed Mobility Overal bed mobility: Modified Independent Bed Mobility: Supine to Sit;Sit to Supine     Supine to sit: Min guard;HOB elevated(use of bed rails) Sit to supine: Min assist(assist for LLE back into bed)   General bed mobility comments: assist for back into bed, impacted by pain  Transfers Overall transfer level: Needs assistance Equipment used: Rolling walker (2 wheeled) Transfers: Sit to/from Stand Sit to Stand: Min assist         General transfer comment: more painful,  needing the surface raised.  Cues for hand placement and to extend L LE for sitting.    Balance Overall balance assessment: Needs assistance Sitting-balance support: No upper extremity supported;Feet supported Sitting balance-Leahy Scale: Good       Standing balance-Leahy Scale: Fair Standing balance comment: though more reliant on the RW today due to increased pain                           ADL either performed or assessed with clinical judgement   ADL Overall ADL's : Needs assistance/impaired Eating/Feeding: Independent;Sitting   Grooming: Wash/dry hands;Wash/dry face;Oral care;Min guard;Sitting;Cueing for compensatory techniques;Standing Grooming Details (indicate cue type and reason): Pt required rest break after performing oral care - more for pain management than anything else Upper Body Bathing: Set up;Sitting   Lower Body Bathing: Moderate assistance;Sitting/lateral leans   Upper Body Dressing : Set up;Sitting   Lower Body Dressing: Maximal assistance;Sit to/from stand Lower Body Dressing Details (indicate cue type and reason): unable to perform figure 4 or reach down to feet at this time Toilet Transfer: Min guard;Ambulation;RW Toilet Transfer Details (indicate cue type and reason): elevated toilet with 3 in 1 as toilet riser Toileting- Clothing Manipulation and Hygiene: Min guard;Sit to/from stand       Functional mobility during ADLs: Min guard;Rolling walker;Cueing for safety General ADL Comments: decreased cognition, increased pain, decreased access to LB for ADL     Vision Baseline Vision/History: Wears glasses Wears Glasses: Reading only Patient Visual Report:  No change from baseline       Perception     Praxis      Pertinent Vitals/Pain Pain Assessment: 0-10 Pain Score: 9  Pain Location: L LE in quad Pain Descriptors / Indicators: Burning;Grimacing;Discomfort Pain Intervention(s): Monitored during session;Repositioned     Hand  Dominance Right   Extremity/Trunk Assessment Upper Extremity Assessment Upper Extremity Assessment: Overall WFL for tasks assessed   Lower Extremity Assessment Lower Extremity Assessment: Defer to PT evaluation   Cervical / Trunk Assessment Cervical / Trunk Assessment: Normal   Communication Communication Communication: No difficulties   Cognition Arousal/Alertness: Awake/alert Behavior During Therapy: WFL for tasks assessed/performed Overall Cognitive Status: Within Functional Limits for tasks assessed Area of Impairment: Orientation;Problem solving                 Orientation Level: Place           Problem Solving: Requires verbal cues General Comments: Pt confused by BP cuff tightening, "what is this thing doing?"; "what do you mean I am not at home...oh yeah, I keep thinking I am at home because I have the same setup"   General Comments  VSS    Exercises     Shoulder Instructions      Home Living Family/patient expects to be discharged to:: Private residence Living Arrangements: Spouse/significant other Available Help at Discharge: Family;Available 24 hours/day Type of Home: House Home Access: Level entry     Home Layout: One level     Bathroom Shower/Tub: Occupational psychologist: Standard Bathroom Accessibility: Yes How Accessible: Accessible via walker Home Equipment: Shower seat - built in;Cane - single point          Prior Functioning/Environment Level of Independence: Independent        Comments: played pickle ball regularly.  Got cramps in his L leg after about 20 min.        OT Problem List: Decreased activity tolerance;Decreased range of motion;Impaired balance (sitting and/or standing);Decreased cognition;Decreased safety awareness;Pain      OT Treatment/Interventions: Self-care/ADL training;DME and/or AE instruction;Therapeutic activities;Patient/family education;Balance training    OT Goals(Current goals can be  found in the care plan section) Acute Rehab OT Goals Patient Stated Goal: home with wife's assist as needed OT Goal Formulation: With patient Time For Goal Achievement: 10/04/18 Potential to Achieve Goals: Good ADL Goals Pt Will Perform Grooming: with modified independence;standing Pt Will Perform Upper Body Dressing: with modified independence;sitting Pt Will Perform Lower Body Dressing: with set-up;with caregiver independent in assisting;sit to/from stand Pt Will Transfer to Toilet: with modified independence;ambulating Pt Will Perform Toileting - Clothing Manipulation and hygiene: with modified independence;sit to/from stand  OT Frequency: Min 2X/week   Barriers to D/C:            Co-evaluation              AM-PAC OT "6 Clicks" Daily Activity     Outcome Measure Help from another person eating meals?: None Help from another person taking care of personal grooming?: A Little Help from another person toileting, which includes using toliet, bedpan, or urinal?: A Little Help from another person bathing (including washing, rinsing, drying)?: A Lot Help from another person to put on and taking off regular upper body clothing?: None Help from another person to put on and taking off regular lower body clothing?: A Lot 6 Click Score: 18   End of Session Equipment Utilized During Treatment: Gait belt;Rolling walker Nurse Communication: Mobility status;Other (comment)(brief change  in cognition)  Activity Tolerance: Patient limited by pain Patient left: in bed;with call bell/phone within reach;with bed alarm set;Other (comment)(all 4 bed rails up (that is how it was when therapy entered))  OT Visit Diagnosis: Unsteadiness on feet (R26.81);Other abnormalities of gait and mobility (R26.89);Muscle weakness (generalized) (M62.81);Other symptoms and signs involving cognitive function;Pain Pain - Right/Left: Left Pain - part of body: Leg                Time: 1554-1620 OT Time  Calculation (min): 26 min Charges:  OT General Charges $OT Visit: 1 Visit OT Evaluation $OT Eval Moderate Complexity: 1 Mod OT Treatments $Self Care/Home Management : 8-22 mins  Hulda Humphrey OTR/L Acute Rehabilitation Services Pager: [phone] Office: [place] 09/20/2018, 4:50 PM

## 2018-09-20 NOTE — Progress Notes (Signed)
Physical Therapy Treatment Patient Details Name: Mario [name] MRN: [id number] DOB: May 10, 1941 Today's Date: 09/20/2018    History of Present Illness Pt is a 78 y/o male  with h/o DM2, PAD, iCM, CHF, CAD admitted for vascular surgery due to Left 2nd toe non-healing ulcer, leg cramping, L foot ischemic pain, PAOD and right groin lymph node CA.  4/28 Left fem pop BPG, L ext iliac, common femoral, frofundofemoral endarterectomy, L pop endarterectomy.  4/29 right superficial inguinal lymph node dissection.    PT Comments    Pt is experiencing significantly more pain today, limiting amount of time spent on his feet.  Emphasis on transfer safety, gait sequencing and stability.    Follow Up Recommendations  No PT follow up;Supervision - Intermittent     Equipment Recommendations  Rolling walker with 5" wheels    Recommendations for Other Services       Precautions / Restrictions Precautions Precautions: Fall    Mobility  Bed Mobility Overal bed mobility: Modified Independent                Transfers Overall transfer level: Needs assistance   Transfers: Sit to/from Stand Sit to Stand: Min assist         General transfer comment: more painful, needing the surface raised.  Cues for hand placement and to extend L LE for sitting.  Ambulation/Gait Ambulation/Gait assistance: Min guard Gait Distance (Feet): 50 Feet Assistive device: Rolling walker (2 wheeled) Gait Pattern/deviations: Step-to pattern Gait velocity: slow   General Gait Details: antalgic with inability to fully bear weight on the left LE.  L LE staying flexed overall.   Stairs             Wheelchair Mobility    Modified Rankin (Stroke Patients Only)       Balance Overall balance assessment: Needs assistance   Sitting balance-Leahy Scale: Good       Standing balance-Leahy Scale: Fair Standing balance comment: though more reliant on the RW today due to increased pain                             Cognition Arousal/Alertness: Awake/alert Behavior During Therapy: WFL for tasks assessed/performed Overall Cognitive Status: Within Functional Limits for tasks assessed                                        Exercises      General Comments General comments (skin integrity, edema, etc.): vss      Pertinent Vitals/Pain Pain Assessment: 0-10 Pain Score: 9  Pain Location: L LE in quad Pain Descriptors / Indicators: Burning;Grimacing;Discomfort Pain Intervention(s): Monitored during session;Limited activity within patient's tolerance;RN gave pain meds during session    Home Living                      Prior Function            PT Goals (current goals can now be found in the care plan section) Acute Rehab PT Goals Patient Stated Goal: home with wife's assist as needed PT Goal Formulation: With patient Time For Goal Achievement: 09/26/18 Progress towards PT goals: Progressing toward goals    Frequency    Min 3X/week      PT Plan Current plan remains appropriate    Co-evaluation  AM-PAC PT "6 Clicks" Mobility   Outcome Measure  Help needed turning from your back to your side while in a flat bed without using bedrails?: None Help needed moving from lying on your back to sitting on the side of a flat bed without using bedrails?: None Help needed moving to and from a bed to a chair (including a wheelchair)?: A Little Help needed standing up from a chair using your arms (e.g., wheelchair or bedside chair)?: A Little Help needed to walk in hospital room?: A Little Help needed climbing 3-5 steps with a railing? : A Little 6 Click Score: 20    End of Session   Activity Tolerance: Patient tolerated treatment well;Patient limited by pain Patient left: in chair;with call bell/phone within reach Nurse Communication: Mobility status PT Visit Diagnosis: Unsteadiness on feet (R26.81);Other abnormalities of gait  and mobility (R26.89);Difficulty in walking, not elsewhere classified (R26.2)     Time: 5015-8682 PT Time Calculation (min) (ACUTE ONLY): 16 min  Charges:  $Gait Training: 8-22 mins                     09/20/2018  Donnella Sham, PT Acute Rehabilitation Services [phone]  (pager) [phone]  (office)   Tessie Fass Ahley Bulls 09/20/2018, 12:26 PM

## 2018-09-21 LAB — GLUCOSE, CAPILLARY
Glucose-Capillary: 98 mg/dL (ref 70–99)
Glucose-Capillary: 155 mg/dL — ABNORMAL HIGH (ref 70–99)
Glucose-Capillary: 245 mg/dL — ABNORMAL HIGH (ref 70–99)
Glucose-Capillary: 180 mg/dL — ABNORMAL HIGH (ref 70–99)

## 2018-09-21 NOTE — TOC Initial Note (Signed)
Transition of Care (TOC) - Initial/Assessment Note  Marvetta Gibbons RN, BSN Transitions of Care Unit 4E- RN Case Manager [phone]  Patient Details  Name: Mario [name] MRN: [id number] Date of Birth: 24-Jul-1940  Transition of Care [facility]) CM/SW Contact:    Dawayne Patricia, RN Phone Number: 09/21/2018, 12:18 PM  Clinical Narrative:                 Pt admitted s/p left fempop bypass graft, orders placed for DME- 3n1 and RW- pt to return home in next 1-2 days- call made to [facility] with [place] for DME needs- DME to be delivered to room prior to discharge.   Expected Discharge Plan: Home/Self Care Barriers to Discharge: Continued Medical Work up   Patient Goals and CMS Choice Patient states their goals for this hospitalization and ongoing recovery are:: "to return home with wife" CMS Medicare.gov Compare Post Acute Care list provided to:: Patient Choice offered to / list presented to : NA  Expected Discharge Plan and Services Expected Discharge Plan: Home/Self Care   Discharge Planning Services: CM Consult Post Acute Care Choice: Durable Medical Equipment Living arrangements for the past 2 months: Single Family Home                 DME Arranged: Walker rolling, 3-N-1 DME Agency: [employer] Date DME Agency Contacted: 09/21/18 Time DME Agency Contacted: 1217 Representative spoke with at DME Agency: Thedore Mins            Prior Living Arrangements/Services Living arrangements for the past 2 months: [place] Lives with:: Spouse Patient language and need for interpreter reviewed:: Yes Do you feel safe going back to the place where you live?: Yes      Need for Family Participation in Patient Care: Yes (Comment) Care giver support system in place?: Yes (comment)   Criminal Activity/Legal Involvement Pertinent to Current Situation/Hospitalization: No - Comment as needed  Activities of Daily Living Home Assistive Devices/Equipment: None, Eyeglasses ADL Screening  (condition at time of admission) Patient's cognitive ability adequate to safely complete daily activities?: Yes Is the patient deaf or have difficulty hearing?: No Does the patient have difficulty seeing, even when wearing glasses/contacts?: No Does the patient have difficulty concentrating, remembering, or making decisions?: No Patient able to express need for assistance with ADLs?: Yes Does the patient have difficulty dressing or bathing?: No Independently performs ADLs?: Yes (appropriate for developmental age) Does the patient have difficulty walking or climbing stairs?: No Weakness of Legs: Both Weakness of Arms/Hands: None  Permission Sought/Granted                  Emotional Assessment Appearance:: Appears stated age Attitude/Demeanor/Rapport: Engaged Affect (typically observed): Accepting, Appropriate Orientation: : Oriented to Self, Oriented to Situation, Oriented to Place, Oriented to  Time   Psych Involvement: No (comment)  Admission diagnosis:  PERIPHERAL VASCULAR DISEASE WITH ULCER LEFT LOWER EXTREMITY Patient Active Problem List   Diagnosis Date Noted  . Squamous cell skin cancer, thigh, right 09/19/2018  . Inguinal adenopathy 09/19/2018  . Pulmonary infiltrate present on computed tomography 08/16/2018  . Chronic cough 08/16/2018  . Abnormal myocardial perfusion study 10/02/2014  . Tobacco use 04/08/2014  . PAD (peripheral artery disease) ([place])   . Orthostasis   . CAD (coronary artery disease) of artery bypass graft   . Gout   . Essential hypertension, benign 06/13/2013  . 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 Type II or unspecified type diabetes mellitus  without mention of complication, uncontrolled 01/25/2013  . Hyperlipidemia 01/25/2013   PCP:  [name], MD Pharmacy:   [place], [place] - [address] [place] [address] Phone: [phone] Fax:  [phone]  [id number] - [place], [place] [address] [place] [address] Phone: [phone] Fax: [phone]     Social Determinants of Health ([place]) Interventions    Readmission Risk Interventions No flowsheet data found.

## 2018-09-21 NOTE — Progress Notes (Addendum)
  Progress Note    09/21/2018 11:10 AM 2 Days Post-Op  Subjective:  No complaints  Afebrile HR 80's-90's  993'T-701'X systolic 79% RA  Vitals:   09/21/18 0538 09/21/18 0922  BP: (!) 143/66 (!) 138/49  Pulse: 90 84  Resp: 18   Temp: 98.5 F (36.9 C)   SpO2: 99%     Physical Exam: Cardiac:  regular Lungs:  Non labored Incisions:  Healing nicely Extremities:  3+ palpable left DP pulse; mild swelling in left foot and leg   CBC    Component Value Date/Time   WBC 8.7 09/20/2018 0317   RBC 2.62 (L) 09/20/2018 0317   HGB 7.4 (L) 09/20/2018 0317   HCT 23.2 (L) 09/20/2018 0317   PLT 157 09/20/2018 0317   MCV 88.5 09/20/2018 0317   MCH 28.2 09/20/2018 0317   MCHC 31.9 09/20/2018 0317   RDW 13.9 09/20/2018 0317   LYMPHSABS 1.5 09/18/2018 0912   MONOABS 0.7 09/18/2018 0912   EOSABS 0.2 09/18/2018 0912   BASOSABS 0.0 09/18/2018 0912    BMET    Component Value Date/Time   NA 137 09/19/2018 0347   K 4.1 09/19/2018 0347   CL 108 09/19/2018 0347   CO2 21 (L) 09/19/2018 0347   GLUCOSE 231 (H) 09/19/2018 0347   BUN 28 (H) 09/19/2018 0347   CREATININE 1.35 (H) 09/19/2018 0347   CREATININE 1.14 08/13/2013 1043   CALCIUM 8.4 (L) 09/19/2018 0347   GFRNONAA 50 (L) 09/19/2018 0347   GFRAA 58 (L) 09/19/2018 0347    INR    Component Value Date/Time   INR 1.1 09/13/2018 1129     Intake/Output Summary (Last 24 hours) at 09/21/2018 1110 Last data filed at 09/21/2018 3903 Gross per 24 hour  Intake 1020 ml  Output 1300 ml  Net -280 ml     Assessment:  78 y.o. male is s/p:  #1: Left common femoral to below-knee popliteal artery bypass graft with 6 mm external ring propatent PTFE #2: Left external iliac, common femoral, profundofemoral endarterectomy with vein patch angioplasty  3 Days Post-Op And  Right superficial inguinal lymph node dissection (general surgery) 2 Days Post-Op  Plan: -pt with 3+ left palpable DP pulse -PT recommending no PT follow up; supervision  and RW with 5" wheels & OT recommending 3in1 BSC -continue increasing mobilization  -dc IVF -DVT prophylaxis:  Sq heparin -anticipate discharge in the next few days   Leontine Locket, PA-C [facility] [phone] 09/21/2018 11:10 AM  I have examined the patient, reviewed and agree with above.  Curt Jews, MD 09/21/2018 11:33 AM

## 2018-09-21 NOTE — Progress Notes (Signed)
PM PROGRESS NOTE  Emphasis on a HEP geared to strengthen generally,  strengthen at knees, help with L knee extension.    09/21/18 1300  PT Visit Information  Last PT Received On 09/21/18  Assistance Needed +1  History of Present Illness Pt is a 78 y/o male  with h/o DM2, PAD, iCM, CHF, CAD admitted for vascular surgery due to Left 2nd toe non-healing ulcer, leg cramping, L foot ischemic pain, PAOD and right groin lymph node CA.  4/28 Left fem pop BPG, L ext iliac, common femoral, frofundofemoral endarterectomy, L pop endarterectomy.  4/29 right superficial inguinal lymph node dissection.  Subjective Data  Patient Stated Goal home with wife's assist as needed  Precautions  Precautions Fall  Pain Assessment  Pain Assessment 0-10  Pain Score 6  Pain Location L LE in quad  Pain Descriptors / Indicators Burning;Grimacing;Discomfort  Pain Intervention(s) Premedicated before session;Monitored during session  Cognition  Arousal/Alertness Awake/alert  Behavior During Therapy WFL for tasks assessed/performed  Overall Cognitive Status Within Functional Limits for tasks assessed  Bed Mobility  Overal bed mobility Modified Independent  Transfers  Overall transfer level Needs assistance  Transfers Sit to/from Stand  Sit to Stand Min guard;Min assist  General transfer comment improved ability to stand with less pain.  Ambulation/Gait  Ambulation/Gait assistance Min guard  Gait Distance (Feet) 240 Feet  Assistive device Rolling walker (2 wheeled)  Gait Pattern/deviations Step-through pattern  General Gait Details still antalgic, but less L knee flexion, pt able to get heel to the floor.  Gait velocity slow  Balance  Sitting balance-Leahy Scale Good  Standing balance-Leahy Scale Fair  General Comments  General comments (skin integrity, edema, etc.) We discussed various open chain exercises pt could do at home.    Exercises  Exercises General Lower Extremity  General Exercises - Lower  Extremity  Long Arc Quad AROM;Strengthening;Both;15 reps;Seated (stress on holds in extension, esp L LE)  Hip Flexion/Marching AROM;Both;10 reps;Seated  PT - End of Session  Activity Tolerance Patient tolerated treatment well;Patient limited by pain  Patient left with call bell/phone within reach;in bed  Nurse Communication Mobility status   PT - Assessment/Plan  PT Plan Current plan remains appropriate  PT Visit Diagnosis Other abnormalities of gait and mobility (R26.89);Pain  Pain - Right/Left Left  Pain - part of body  (leg)  PT Frequency (ACUTE ONLY) Min 3X/week  Follow Up Recommendations Home health PT;Other (comment) (pt wants to have the option for therapy up front to help pus)  PT equipment Rolling walker with 5" wheels  AM-PAC PT "6 Clicks" Mobility Outcome Measure (Version 2)  Help needed turning from your back to your side while in a flat bed without using bedrails? 4  Help needed moving from lying on your back to sitting on the side of a flat bed without using bedrails? 4  Help needed moving to and from a bed to a chair (including a wheelchair)? 3  Help needed standing up from a chair using your arms (e.g., wheelchair or bedside chair)? 3  Help needed to walk in hospital room? 3  Help needed climbing 3-5 steps with a railing?  3  6 Click Score 20  Consider Recommendation of Discharge To: Home with no services  PT Goal Progression  Progress towards PT goals Progressing toward goals  Acute Rehab PT Goals  Time For Goal Achievement 09/26/18  PT Time Calculation  PT Start Time (ACUTE ONLY) 1244  PT Stop Time (ACUTE ONLY) 1309  PT  Time Calculation (min) (ACUTE ONLY) 25 min  PT General Charges  $$ ACUTE PT VISIT 1 Visit  PT Treatments  $Gait Training 8-22 mins  $Therapeutic Exercise 8-22 mins   09/21/2018  Donnella Sham, PT Acute Rehabilitation Services [phone]  (pager) [phone]  (office)

## 2018-09-21 NOTE — Progress Notes (Signed)
Physical Therapy Treatment Patient Details Name: Mario [name] MRN: [id number] DOB: 12-Jan-1941 Today's Date: 09/21/2018    History of Present Illness Pt is a 78 y/o male  with h/o DM2, PAD, iCM, CHF, CAD admitted for vascular surgery due to Left 2nd toe non-healing ulcer, leg cramping, L foot ischemic pain, PAOD and right groin lymph node CA.  4/28 Left fem pop BPG, L ext iliac, common femoral, frofundofemoral endarterectomy, L pop endarterectomy.  4/29 right superficial inguinal lymph node dissection.    PT Comments    Gait continues to be limited by L anterior thigh pain.  Pt wanting to get some break through pain meds and try again.  Emphasis on safer transfer technique and gait training in the RW.    Follow Up Recommendations  No PT follow up;Supervision - Intermittent     Equipment Recommendations  Rolling walker with 5" wheels    Recommendations for Other Services       Precautions / Restrictions Precautions Precautions: Fall    Mobility  Bed Mobility Overal bed mobility: Modified Independent                Transfers Overall transfer level: Needs assistance Equipment used: Rolling walker (2 wheeled) Transfers: Sit to/from Stand Sit to Stand: Min assist         General transfer comment: cues for better hand placement.  needing minimal assist from toilet and bed due to increased pain.  Ambulation/Gait Ambulation/Gait assistance: Min guard Gait Distance (Feet): 15 Feet(then 20 feet, limited by pain.) Assistive device: Rolling walker (2 wheeled) Gait Pattern/deviations: Step-to pattern;Step-through pattern Gait velocity: slow   General Gait Details: antalgic with flexed knee in w/bearing due to L quad/anterior thigh pain.  Pt wants to try again with break through pain medicine.   Stairs             Wheelchair Mobility    Modified Rankin (Stroke Patients Only)       Balance Overall balance assessment: Needs assistance   Sitting  balance-Leahy Scale: Good       Standing balance-Leahy Scale: Fair Standing balance comment: though reliant on RW for gait or dynamic tasks.                            Cognition Arousal/Alertness: Awake/alert Behavior During Therapy: WFL for tasks assessed/performed Overall Cognitive Status: Within Functional Limits for tasks assessed                                        Exercises      General Comments General comments (skin integrity, edema, etc.): vss      Pertinent Vitals/Pain Pain Assessment: 0-10 Pain Score: 9  Pain Location: L LE in quad Pain Descriptors / Indicators: Burning;Grimacing;Discomfort Pain Intervention(s): Monitored during session    Home Living                      Prior Function            PT Goals (current goals can now be found in the care plan section) Acute Rehab PT Goals Patient Stated Goal: home with wife's assist as needed PT Goal Formulation: With patient Time For Goal Achievement: 09/26/18 Progress towards PT goals: Progressing toward goals    Frequency    Min 3X/week      PT Plan  Current plan remains appropriate    Co-evaluation              AM-PAC PT "6 Clicks" Mobility   Outcome Measure  Help needed turning from your back to your side while in a flat bed without using bedrails?: None Help needed moving from lying on your back to sitting on the side of a flat bed without using bedrails?: None Help needed moving to and from a bed to a chair (including a wheelchair)?: A Little Help needed standing up from a chair using your arms (e.g., wheelchair or bedside chair)?: A Little Help needed to walk in hospital room?: A Little Help needed climbing 3-5 steps with a railing? : A Little 6 Click Score: 20    End of Session   Activity Tolerance: Patient tolerated treatment well;Patient limited by pain Patient left: with call bell/phone within reach;in bed Nurse Communication: Mobility  status PT Visit Diagnosis: Unsteadiness on feet (R26.81);Other abnormalities of gait and mobility (R26.89);Difficulty in walking, not elsewhere classified (R26.2)     Time: 9678-9381 PT Time Calculation (min) (ACUTE ONLY): 19 min  Charges:  $Gait Training: 8-22 mins                     09/21/2018  Donnella Sham, PT Acute Rehabilitation Services [phone]  (pager) [phone]  (office)   Tessie Fass Roniesha Hollingshead 09/21/2018, 12:03 PM

## 2018-09-22 LAB — GLUCOSE, CAPILLARY
Glucose-Capillary: 164 mg/dL — ABNORMAL HIGH (ref 70–99)
Glucose-Capillary: 199 mg/dL — ABNORMAL HIGH (ref 70–99)

## 2018-09-22 MED ORDER — OXYCODONE HCL 5 MG PO TABS: 5.0000 mg | ORAL_TABLET | Freq: Four times a day (QID) | ORAL | 0 refills | Status: DC | PRN

## 2018-09-22 MED ORDER — DIPHENHYDRAMINE HCL 25 MG PO CAPS
25.0000 mg | ORAL_CAPSULE | Freq: Every evening | ORAL | Status: DC | PRN
  Administered 2018-09-22: 25 mg via ORAL
  Filled 2018-09-22: qty 1

## 2018-09-22 NOTE — Discharge Summary (Signed)
Discharge Summary     Mario [name] 1941-04-21 78 y.o. 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 male  with a left toe wound.  Recent angio showed CFA/OFA/SFA occlusion.  His only options for limb salvage is bypass  Hospital Course:  The patient was admitted to the hospital and taken to the operating room on 09/19/2018 and underwent: #1: Left common femoral to below-knee popliteal artery bypass graft with 6 mm external ring propatent PTFE #2: Left external iliac, common femoral, profundofemoral endarterectomy with vein patch angioplasty #3: Left popliteal endarterectomy    Findings: Extensive exophytic calcific plaque from the distal external iliac artery into the common femoral and profundofemoral artery.  I placed a vein patch from the inguinal ligament down to the profundofemoral artery going about 2 cm onto the profundofemoral artery.  I also performed endarterectomy of the below-knee popliteal artery down to the level and going into the anterior tibial and tibioperoneal trunk.  The saphenous vein was not of adequate quality for bypass.  He had a brisk anterior tibial Doppler signal at the end of the procedure  The pt tolerated the procedure well and was transported to the PACU in good condition.   On 09/19/2018, he went to the operating room and underwent Right superficial inguinal lymph node dissection by Dr. Dalbert Batman.    Operative Findings:       We took out all of the inguinal nodes and lymphatic tissue from anterior and below the right inguinal ligament down to the inferior aspect of the right femoral triangle.  There was one clearly enlarged lymph node and a second lymph node that was somewhat enlarged and firm.  There was no palpable mass after we removed the lymph node packet.  Pt did well  postoperatively.  He did have acute surgical blood loss anemia but tolerated well and did not receive a transfusion.  He was evaluated by PT and did not require HHPT.    Post operatively, the pt continued to have an easily palpable left DP pulse.  Proper leg elevation was discussed with the pt.    He was discharged home on POD 3.    The remainder of the hospital course consisted of increasing mobilization and increasing intake of solids without difficulty.  CBC    Component Value Date/Time   WBC 8.7 09/20/2018 0317   RBC 2.62 (L) 09/20/2018 0317   HGB 7.4 (L) 09/20/2018 0317   HCT 23.2 (L) 09/20/2018 0317   PLT 157 09/20/2018 0317   MCV 88.5 09/20/2018 0317   MCH 28.2 09/20/2018 0317   MCHC 31.9 09/20/2018 0317   RDW 13.9 09/20/2018 0317   LYMPHSABS 1.5 09/18/2018 0912   MONOABS 0.7 09/18/2018 0912   EOSABS 0.2 09/18/2018 0912   BASOSABS 0.0 09/18/2018 0912    BMET    Component Value Date/Time   NA 137 09/19/2018 0347   K 4.1 09/19/2018 0347   CL 108 09/19/2018 0347   CO2 21 (L) 09/19/2018 0347   GLUCOSE 231 (H) 09/19/2018 0347   BUN 28 (H) 09/19/2018 0347   CREATININE 1.35 (H) 09/19/2018 0347   CREATININE 1.14 08/13/2013 1043   CALCIUM 8.4 (L) 09/19/2018 0347   GFRNONAA 50 (L) 09/19/2018 0347   GFRAA 58 (L) 09/19/2018 0347     Discharge Instructions    Discharge  patient   Complete by:  As directed    Discharge disposition:  01-Home or Self Care   Discharge patient date:  09/22/2018      Discharge Diagnosis:  PERIPHERAL VASCULAR DISEASE WITH ULCER LEFT LOWER EXTREMITY  Secondary Diagnosis: Patient Active Problem List   Diagnosis Date Noted   Squamous cell skin cancer, thigh, right 09/19/2018   Inguinal adenopathy 09/19/2018   Pulmonary infiltrate present on computed tomography 08/16/2018   Chronic cough 08/16/2018   Abnormal myocardial perfusion study 10/02/2014   Tobacco use 04/08/2014   PAD (peripheral artery disease) (HCC)    Orthostasis     CAD (coronary artery disease) of artery bypass graft    Gout    Essential hypertension, benign 06/13/2013   Chronic kidney disease, stage II (mild) 04/12/2013   Type II or unspecified type diabetes mellitus without mention of complication, uncontrolled 01/25/2013   Hyperlipidemia 01/25/2013   Past Medical History:  Diagnosis Date   Arthritis    CAD (coronary artery disease)    a.  s/p IMI and BMS 1997;  b. Myoview 4/16:  inf scar with peri-infarct ischemia, EF 34%; high risk;  c. [place] 5/16:  3 v CAD >> CABG (free L-LAD, S-OM, S-dRCA)   Carotid stenosis    a. carotid US 6/16:  bilat ICA 1-39%   Chronic kidney disease, stage II (mild)    Chronic systolic CHF (congestive heart failure) (HCC)    Essential hypertension, benign    Gout    HLD (hyperlipidemia)    Inguinal adenopathy 09/19/2018   Ischemic cardiomyopathy    a. EF by myoview 4/16 34%; b. LHC 5/16: EF 40-45%;  c. intraop TEE 6/16: EF 45-50%   Orthostasis    PAD (peripheral artery disease) (HCC)    Dr. Fletcher Anon   Pneumonia    hx of   Type II or unspecified type diabetes mellitus without mention of complication, uncontrolled      Allergies as of 09/22/2018      Reactions   Penicillins Anaphylaxis   Has patient had a PCN reaction causing immediate rash, facial/tongue/throat swelling, SOB or lightheadedness with hypotension: Yes Has patient had a PCN reaction causing severe rash involving mucus membranes or skin necrosis: No Has patient had a PCN reaction that required hospitalization: Yes Has patient had a PCN reaction occurring within the last 10 years: Unknown If all of the above answers are "NO", then may proceed with Cephalosporin use.   Plavix [clopidogrel Bisulfate] Hives   Pletal [cilostazol] Itching      Medication List    STOP taking these medications   oxyCODONE-acetaminophen 5-325 MG tablet Commonly known as:  Percocet   traMADol 50 MG tablet Commonly known as:  ULTRAM     TAKE these  medications   acetaminophen 500 MG tablet Commonly known as:  TYLENOL Take 1,000 mg by mouth daily as needed for moderate pain.   allopurinol 100 MG tablet Commonly known as:  ZYLOPRIM Take 1 tablet by mouth daily   ARTIFICIAL TEAR OP Apply 1 drop to eye daily as needed (dry eyes).   aspirin EC 81 MG tablet Take 81 mg by mouth daily.   atorvastatin 40 MG tablet Commonly known as:  LIPITOR take 1 tablet by mouth daily at 6:00pm What changed:  See the new instructions.   calcium carbonate 500 MG chewable tablet Commonly known as:  TUMS - dosed in mg elemental calcium Chew 1 tablet by mouth daily as needed for indigestion or heartburn.  carvedilol 3.125 MG tablet Commonly known as:  COREG TAKE 1 TABLET BY MOUTH TWICE A DAY What changed:  when to take this   CoQ10 200 MG Caps Take 200 mg by mouth daily.   fenofibrate micronized 43 MG capsule Commonly known as:  ANTARA Take 43 mg by mouth every evening.   ferrous sulfate 325 (65 FE) MG tablet Take 325 mg by mouth daily.   finasteride 5 MG tablet Commonly known as:  PROSCAR Take 5 mg by mouth every evening.   folic acid 989 MCG tablet Commonly known as:  FOLVITE Take 400 mcg by mouth daily.   freestyle lancets Use as instructed to check blood sugar 2 times per day dx code E11.65   FreeStyle Lite Devi USE TO TEST BLOOD SUGAR TWICE DAILY   glipiZIDE 10 MG 24 hr tablet Commonly known as:  GLUCOTROL XL TAKE 1 TABLET BY MOUTH AT BEDTIME FOR DIABETES. What changed:  See the new instructions.   glucose blood test strip Commonly known as:  FREESTYLE LITE Use as instructed to check blood sugar 2 times per day dx code E11.65   hydrocortisone cream 1 % Apply 1 application topically daily as needed (irritation).   Leg Cramp Relief Tabs Take 1 tablet by mouth daily as needed (leg cramps).   lisinopril 5 MG tablet Commonly known as:  ZESTRIL Take 1 tablet (5 mg total) by mouth daily.   metFORMIN 1000 MG  tablet Commonly known as:  GLUCOPHAGE TAKE 1 TABLET BY MOUTH TWICE DAILY FOR BREAKFAST AND DINNER FOR DIABETES. What changed:  See the new instructions.   miglitol 50 MG tablet Commonly known as:  GLYSET TAKE 1 TABLET BY MOUTH TWICE DAILY AT BREAKFAST AND DINNER FOR DIABETES. What changed:  See the new instructions.   multivitamin-lutein Caps capsule Take 1 capsule by mouth 2 (two) times daily.   oxyCODONE 5 MG immediate release tablet Commonly known as:  Roxicodone Take 1 tablet (5 mg total) by mouth every 6 (six) hours as needed.   tamsulosin 0.4 MG Caps capsule Commonly known as:  FLOMAX Take 0.4 mg by mouth 2 (two) times daily.   Victoza 18 MG/3ML Sopn Generic drug:  liraglutide INJECT 1.8MG  INTO THE SKIN DAILY AT DINNERTIME FOR DIABETES. What changed:  See the new instructions.   vitamin B-12 1000 MCG tablet Commonly known as:  CYANOCOBALAMIN Take 1,000 mcg by mouth daily.   Vitamin D 50 MCG (2000 UT) Caps Take 2,000 Units by mouth daily.            Durable Medical Equipment  (From admission, onward)         Start     Ordered   09/21/18 1114  For home use only DME 3 n 1  Once     09/21/18 1114   09/21/18 1114  For home use only DME Walker rolling  Once    Question:  Patient needs a walker to treat with the following condition  Answer:  S/P femoral-popliteal bypass surgery   09/21/18 1114          Discharge Instructions: [facility] Discharge instructions Lower Extremity Bypass Surgery  Please refer to the following instruction for your post-procedure care. Your surgeon or physician assistant will discuss any changes with you.  Activity  You are encouraged to walk as much as you can. You can slowly return to normal activities during the month after your surgery. Avoid strenuous activity and heavy lifting until your doctor  tells you it's OK. Avoid activities such as vacuuming or swinging a golf club. Do not drive until  your doctor give the OK and you are no longer taking prescription pain medications. It is also normal to have difficulty with sleep habits, eating and bowel movement after surgery. These will go away with time.  Bathing/Showering  You may shower after you go home. Do not soak in a bathtub, hot tub, or swim until the incision heals completely.  Incision Care  Clean your incision with mild soap and water. Shower every day. Pat the area dry with a clean towel. You do not need a bandage unless otherwise instructed. Do not apply any ointments or creams to your incision. If you have open wounds you will be instructed how to care for them or a visiting nurse may be arranged for you. If you have staples or sutures along your incision they will be removed at your post-op appointment. You may have skin glue on your incision. Do not peel it off. It will come off on its own in about one week.  Wash the groin wound with soap and water daily and pat dry. (No tub bath-only shower)  Then put a dry gauze or washcloth in the groin to keep this area dry to help prevent wound infection.  Do this daily and as needed.  Do not use Vaseline or neosporin on your incisions.  Only use soap and water on your incisions and then protect and keep dry.  Diet  Resume your normal diet. There are no special food restrictions following this procedure. A low fat/ low cholesterol diet is recommended for all patients with vascular disease. In order to heal from your surgery, it is CRITICAL to get adequate nutrition. Your body requires vitamins, minerals, and protein. Vegetables are the best source of vitamins and minerals. Vegetables also provide the perfect balance of protein. Processed food has little nutritional value, so try to avoid this.  Medications  Resume taking all your medications unless your doctor or Physician Assistant tells you not to. If your incision is causing pain, you may take over-the-counter pain relievers such as  acetaminophen (Tylenol). If you were prescribed a stronger pain medication, please aware these medication can cause nausea and constipation. Prevent nausea by taking the medication with a snack or meal. Avoid constipation by drinking plenty of fluids and eating foods with high amount of fiber, such as fruits, vegetables, and grains. Take Colace 100 mg (an over-the-counter stool softener) twice a day as needed for constipation.  Do not take Tylenol if you are taking prescription pain medications.  Follow Up  Our office will schedule a follow up appointment 2-3 weeks following discharge.  Please call us immediately for any of the following conditions  Severe or worsening pain in your legs or feet while at rest or while walking Increase pain, redness, warmth, or drainage (pus) from your incision site(s)  Fever of 101 degree or higher  The swelling in your leg with the bypass suddenly worsens and becomes more painful than when you were in the hospital  If you have been instructed to feel your graft pulse then you should do so every day. If you can no longer feel this pulse, call the office immediately. Not all patients are given this instruction.   Leg swelling is common after leg bypass surgery.  The swelling should improve over a few months following surgery. To improve the swelling, you may elevate your legs above the level of  your heart while you are sitting or resting. Your surgeon or physician assistant may ask you to apply an ACE wrap or wear compression (TED) stockings to help to reduce swelling.  Reduce your risk of vascular disease  Stop smoking. If you would like help call [facility] at 1-800-QUIT-NOW 701 396 1225) or [place] at (440) [phone].   Manage your cholesterol  Maintain a desired weight  Control your diabetes weight  Control your diabetes  Keep your blood pressure down   If you have any questions, please call the office at [phone]   Prescriptions  given: 1.  Roxicodone #30 No Refill  Disposition: home  Patient's condition: is Good  Follow up: 1. Dr. [name] in 2-3 weeks 2. Dr. Dalbert Batman as instructed.   Leontine Locket, PA-C [facility] [phone] 09/22/2018  9:56 AM  - For VQI Registry use ---   Post-op:  Wound infection: No  Graft infection: No  Transfusion: No    If yes, n/a units given New Arrhythmia: No Ipsilateral amputation: No, [ ]  Minor, [ ]  BKA, [ ]  AKA Discharge patency: [x ] Primary, [ ]  Primary assisted, [ ]  Secondary, [ ]  Occluded Patency judged by: [ ]  Dopper only, [ ]  Palpable graft pulse, [x]  Palpable distal pulse, [ ]  ABI inc. > 0.15, [ ]  Duplex Discharge ABI: R not done, L not done D/C Ambulatory Status: Ambulatory  Complications: MI: No, [ ]  Troponin only, [ ]  EKG or Clinical CHF: No Resp failure:No, [ ]  Pneumonia, [ ]  Ventilator Chg in renal function: No, [ ]  Inc. Cr > 0.5, [ ]  Temp. Dialysis,  [ ]  Permanent dialysis Stroke: No, [ ]  Minor, [ ]  Major Return to OR: No  Reason for return to OR: [ ]  Bleeding, [ ]  Infection, [ ]  Thrombosis, [ ]  Revision  Discharge medications: Statin use:  yes ASA use:  yes Plavix use:  no Beta blocker use: yes CCB use:  No ACEI use:   yes ARB use:  no Coumadin use: no

## 2018-09-22 NOTE — Progress Notes (Signed)
AVS given to pt and education provided by Francee Gentile, RN.  All questions addressed.  Peripheral IV removed, vital signs obtained and were stable. Pt discharged to [facility], to go home with wife.

## 2018-09-22 NOTE — Discharge Instructions (Signed)
 [facility] [place]  Discharge instructions  Lower Extremity Bypass Surgery  Please refer to the following instruction for your post-procedure care. Your surgeon or physician assistant will discuss any changes with you.  Activity  You are encouraged to walk as much as you can. You can slowly return to normal activities during the month after your surgery. Avoid strenuous activity and heavy lifting until your doctor tells you it's OK. Avoid activities such as vacuuming or swinging a golf club. Do not drive until your doctor give the OK and you are no longer taking prescription pain medications. It is also normal to have difficulty with sleep habits, eating and bowel movement after surgery. These will go away with time.  Bathing/Showering  Shower daily after you go home. Do not soak in a bathtub, hot tub, or swim until the incision heals completely.  Incision Care  Clean your incision with mild soap and water. Shower every day. Pat the area dry with a clean towel. You do not need a bandage unless otherwise instructed. Do not apply any ointments or creams to your incision. If you have open wounds you will be instructed how to care for them or a visiting nurse may be arranged for you. If you have staples or sutures along your incision they will be removed at your post-op appointment. You may have skin glue on your incision. Do not peel it off. It will come off on its own in about one week.  Wash the groin wound with soap and water daily and pat dry. (No tub bath-only shower)  Then put a dry gauze or washcloth in the groin to keep this area dry to help prevent wound infection.  Do this daily and as needed.  Do not use Vaseline or neosporin on your incisions.  Only use soap and water on your incisions and then protect and keep dry.  Diet  Resume your normal diet. There are no special food restrictions following this procedure. A low fat/ low cholesterol diet is  recommended for all patients with vascular disease. In order to heal from your surgery, it is CRITICAL to get adequate nutrition. Your body requires vitamins, minerals, and protein. Vegetables are the best source of vitamins and minerals. Vegetables also provide the perfect balance of protein. Processed food has little nutritional value, so try to avoid this.  Medications  Resume taking all your medications unless your doctor or physician assistant tells you not to. If your incision is causing pain, you may take over-the-counter pain relievers such as acetaminophen (Tylenol). If you were prescribed a stronger pain medication, please aware these medication can cause nausea and constipation. Prevent nausea by taking the medication with a snack or meal. Avoid constipation by drinking plenty of fluids and eating foods with high amount of fiber, such as fruits, vegetables, and grains. Take Colace 100 mg (an over-the-counter stool softener) twice a day as needed for constipation.  Do not take Tylenol if you are taking prescription pain medications.  Follow Up  Our office will schedule a follow up appointment 2-3 weeks following discharge.  Please call us immediately for any of the following conditions  Severe or worsening pain in your legs or feet while at rest or while walking Increase pain, redness, warmth, or drainage (pus) from your incision site(s) Fever of 101 degree or higher The swelling in your leg with the bypass suddenly worsens and becomes more painful than when you were in the hospital If you have   been instructed to feel your graft pulse then you should do so every day. If you can no longer feel this pulse, call the office immediately. Not all patients are given this instruction.  Leg swelling is common after leg bypass surgery.  The swelling should improve over a few months following surgery. To improve the swelling, you may elevate your legs above the level of your heart while you are  sitting or resting. Your surgeon or physician assistant may ask you to apply an ACE wrap or wear compression (TED) stockings to help to reduce swelling.  Reduce your risk of vascular disease  Stop smoking. If you would like help call [facility] at 1-800-QUIT-NOW (1-800-784-8669) or [place] at [phone].  Manage your cholesterol Maintain a desired weight Control your diabetes weight Control your diabetes Keep your blood pressure down  If you have any questions, please call the office at [phone]  

## 2018-09-22 NOTE — Progress Notes (Addendum)
  Progress Note    09/22/2018 7:53 AM 3 Days Post-Op  Subjective:  Wants to go home  Afebrile 814'G-818'H systolic HR 63'J-49'F  026% RA  Vitals:   09/21/18 1934 09/22/18 0649  BP: (!) 148/73 138/64  Pulse: 85 80  Resp: 12 17  Temp: 98 F (36.7 C) 98 F (36.7 C)  SpO2: 100% 100%    Physical Exam: Cardiac:  regular Lungs:  Non labored Incisions:  Healing nicely bilateral groins and other incisions Extremities:  Easily palpable left DP pulse   CBC    Component Value Date/Time   WBC 8.7 09/20/2018 0317   RBC 2.62 (L) 09/20/2018 0317   HGB 7.4 (L) 09/20/2018 0317   HCT 23.2 (L) 09/20/2018 0317   PLT 157 09/20/2018 0317   MCV 88.5 09/20/2018 0317   MCH 28.2 09/20/2018 0317   MCHC 31.9 09/20/2018 0317   RDW 13.9 09/20/2018 0317   LYMPHSABS 1.5 09/18/2018 0912   MONOABS 0.7 09/18/2018 0912   EOSABS 0.2 09/18/2018 0912   BASOSABS 0.0 09/18/2018 0912    BMET    Component Value Date/Time   NA 137 09/19/2018 0347   K 4.1 09/19/2018 0347   CL 108 09/19/2018 0347   CO2 21 (L) 09/19/2018 0347   GLUCOSE 231 (H) 09/19/2018 0347   BUN 28 (H) 09/19/2018 0347   CREATININE 1.35 (H) 09/19/2018 0347   CREATININE 1.14 08/13/2013 1043   CALCIUM 8.4 (L) 09/19/2018 0347   GFRNONAA 50 (L) 09/19/2018 0347   GFRAA 58 (L) 09/19/2018 0347    INR    Component Value Date/Time   INR 1.1 09/13/2018 1129     Intake/Output Summary (Last 24 hours) at 09/22/2018 0753 Last data filed at 09/22/2018 0100 Gross per 24 hour  Intake 360 ml  Output -  Net 360 ml     Assessment:  78 y.o. male is s/p:  #1: Left common femoral to below-knee popliteal artery bypass graft with 6 mm external ring propatent PTFE #2: Left external iliac, common femoral, profundofemoral endarterectomy with vein patch angioplasty  4 Days Post-Op And Right superficial inguinal lymph node dissection (general surgery) 3 Days Post-Op   Plan: -pt with palpable left DP pulse.   -pt ambulated well yesterday  in the hallways. -discussed groin wound care with pt and he expressed understanding.   -discharge home today and f/u with Dr. [name] in 2-3 weeks    Leontine Locket, PA-C [facility] [phone] 09/22/2018 7:53 AM  I have examined the patient, reviewed and agree with above.  Curt Jews, MD 09/22/2018 9:54 AM

## 2018-09-26 ENCOUNTER — Other Ambulatory Visit: Payer: Self-pay

## 2018-09-26 DIAGNOSIS — I779 Disorder of arteries and arterioles, unspecified: Secondary | ICD-10-CM

## 2018-09-28 ENCOUNTER — Telehealth (HOSPITAL_COMMUNITY): Payer: Self-pay

## 2018-09-28 NOTE — Telephone Encounter (Signed)
The above patient or their representative was contacted and gave the following answers to these questions:         Do you have any of the following symptoms?  Fever                    Cough                   Shortness of breath         NO x 3  Do  you have any of the following other symptoms?    muscle pain         vomiting,        diarrhea        rash         weakness        red eye        abdominal pain         bruising          bruising or bleeding              joint pain           severe headache  NO x 11 Have you been in contact with someone who was or has been sick in the past 2 weeks?  Yes                 Unsure                         Unable to assess     NO x 3  Does the person that you were in contact with have any of the following symptoms?   Cough         shortness of breath           muscle pain         vomiting,            diarrhea            rash            weakness           fever            red eye           abdominal pain           bruising  or  bleeding                joint pain                severe headache               Have you  or someone you have been in contact with traveled internationally in th last month?         If yes, which countries?    NO   Have you  or someone you have been in contact with traveled outside [place] in th last month?         If yes, which state and city?  NO   COMMENTS OR ACTION PLAN FOR THIS PATIENT:

## 2018-10-01 ENCOUNTER — Encounter: Payer: Self-pay | Admitting: Surgery

## 2018-10-01 ENCOUNTER — Ambulatory Visit (INDEPENDENT_AMBULATORY_CARE_PROVIDER_SITE_OTHER)
Admit: 2018-10-01 | Discharge: 2018-10-01 | Disposition: A | Discharge status: Home / Self Care | Payer: PPO | Attending: Family | Admitting: Family

## 2018-10-01 ENCOUNTER — Ambulatory Visit (HOSPITAL_COMMUNITY)
Admission: RE | Admit: 2018-10-01 | Discharge: 2018-10-01 | Disposition: A | Discharge status: Home / Self Care | Payer: PPO | Source: Ambulatory Visit | Attending: Family | Admitting: Family

## 2018-10-01 ENCOUNTER — Ambulatory Visit (INDEPENDENT_AMBULATORY_CARE_PROVIDER_SITE_OTHER): Payer: Self-pay | Admitting: Surgery

## 2018-10-01 ENCOUNTER — Other Ambulatory Visit: Payer: Self-pay

## 2018-10-01 VITALS — BP 114/69 | HR 76 | Temp 97.9°F | Resp 20 | Ht 74.0 in | Wt 167.5 lb

## 2018-10-01 DIAGNOSIS — I779 Disorder of arteries and arterioles, unspecified: Secondary | ICD-10-CM

## 2018-10-01 DIAGNOSIS — L97523 Non-pressure chronic ulcer of other part of left foot with necrosis of muscle: Secondary | ICD-10-CM

## 2018-10-01 NOTE — Progress Notes (Signed)
Patient name: Mario [name] MRN: [id number] DOB: [birth year] Sex: male  REASON FOR VISIT:     post op  HISTORY OF PRESENT ILLNESS:   Mario [name] is a 78 y.o. male who returns today for his first postoperative visit.  On 09/18/2018, he underwent left iliofemoral endarterectomy with vein patch angioplasty and left common femoral to below-knee popliteal artery bypass graft with a 6 mm external ring PTFE graft.  I also performed a left popliteal endarterectomy.  He had extensive exophytic plaque from the distal external iliac artery into the common femoral and profundofemoral artery.  The endarterectomy in the below-knee popliteal artery went down to the level of the anterior tibial and tibioperoneal trunk.  His saphenous vein was not adequate for bypass.  This was all done for nonhealing wound on his left toe.  CURRENT MEDICATIONS:    Current Outpatient Medications  Medication Sig Dispense Refill   acetaminophen (TYLENOL) 500 MG tablet Take 1,000 mg by mouth daily as needed for moderate pain.     allopurinol (ZYLOPRIM) 100 MG tablet Take 1 tablet by mouth daily 90 tablet 0   ARTIFICIAL TEAR OP Apply 1 drop to eye daily as needed (dry eyes).     aspirin EC 81 MG tablet Take 81 mg by mouth daily.     atorvastatin (LIPITOR) 40 MG tablet take 1 tablet by mouth daily at 6:00pm  (Patient taking differently: Take 40 mg by mouth daily. ) 90 tablet 0   Blood Glucose Monitoring Suppl (FREESTYLE LITE) DEVI USE TO TEST BLOOD SUGAR TWICE DAILY 1 each 1   calcium carbonate (TUMS - DOSED IN MG ELEMENTAL CALCIUM) 500 MG chewable tablet Chew 1 tablet by mouth daily as needed for indigestion or heartburn.     carvedilol (COREG) 3.125 MG tablet TAKE 1 TABLET BY MOUTH TWICE A DAY (Patient taking differently: Take 3.125 mg by mouth 2 (two) times daily with a meal. ) 180 tablet 3   Cholecalciferol (VITAMIN D) 2000 units CAPS Take 2,000 Units by mouth daily.      Coenzyme Q10 (COQ10) 200 MG CAPS Take 200 mg by mouth daily.     fenofibrate micronized (ANTARA) 43 MG capsule Take 43 mg by mouth every evening.     ferrous sulfate 325 (65 FE) MG tablet Take 325 mg by mouth daily.     finasteride (PROSCAR) 5 MG tablet Take 5 mg by mouth every evening.   3   folic acid (FOLVITE) 694 MCG tablet Take 400 mcg by mouth daily.     glipiZIDE (GLUCOTROL XL) 10 MG 24 hr tablet TAKE 1 TABLET BY MOUTH AT BEDTIME FOR DIABETES. (Patient taking differently: Take 10 mg by mouth at bedtime. ) 30 tablet 2   glucose blood (FREESTYLE LITE) test strip Use as instructed to check blood sugar 2 times per day dx code E11.65 100 each 3   Homeopathic Products (LEG CRAMP RELIEF) TABS Take 1 tablet by mouth daily as needed (leg cramps).     hydrocortisone cream 1 % Apply 1 application topically daily as needed (irritation).     Lancets (FREESTYLE) lancets Use as instructed to check blood sugar 2 times per day dx code E11.65 100 each 3   lisinopril (PRINIVIL,ZESTRIL) 5 MG tablet Take 1 tablet (5 mg total) by mouth daily. 90 tablet 3   metFORMIN (GLUCOPHAGE) 1000 MG tablet TAKE 1 TABLET BY MOUTH TWICE DAILY FOR BREAKFAST AND DINNER FOR DIABETES. (Patient taking differently: Take 1,000 mg by mouth 2 (  two) times daily with a meal. ) 60 tablet 2   miglitol (GLYSET) 50 MG tablet TAKE 1 TABLET BY MOUTH TWICE DAILY AT BREAKFAST AND DINNER FOR DIABETES. (Patient taking differently: Take 50 mg by mouth 2 (two) times daily. ) 60 tablet 2   multivitamin-lutein (OCUVITE-LUTEIN) CAPS capsule Take 1 capsule by mouth 2 (two) times daily.     oxyCODONE (ROXICODONE) 5 MG immediate release tablet Take 1 tablet (5 mg total) by mouth every 6 (six) hours as needed. 30 tablet 0   tamsulosin (FLOMAX) 0.4 MG CAPS capsule Take 0.4 mg by mouth 2 (two) times daily.     VICTOZA 18 MG/3ML SOPN INJECT 1.8MG  INTO THE SKIN DAILY AT DINNERTIME FOR DIABETES. (Patient taking differently: Inject 1.8 mg into the  skin daily with supper. ) 9 mL 2   vitamin B-12 (CYANOCOBALAMIN) 1000 MCG tablet Take 1,000 mcg by mouth daily.     No current facility-administered medications for this visit.     REVIEW OF SYSTEMS:   [X]  denotes positive finding, [ ]  denotes negative finding Cardiac  Comments:  Chest pain or chest pressure:    Shortness of breath upon exertion:    Short of breath when lying flat:    Irregular heart rhythm:    Constitutional    Fever or chills:      PHYSICAL EXAM:   Vitals:   10/01/18 1108  BP: 114/69  Pulse: 76  Resp: 20  Temp: 97.9 F (36.6 C)  SpO2: 96%  Weight: 76 kg  Height: 6\' 2"  (1.88 m)    GENERAL: The patient is a well-nourished male, in no acute distress. The vital signs are documented above. CARDIOVASCULAR: There is a regular rate and rhythm. PULMONARY: Non-labored respirations Small lymphocele at groin incision.  Both incisions are healed  The left 2nd toe has ischemic changes to the tip with drainage.    STUDIES:   Duplex: Left Graft(s): Fem-pop bypass graft patent with no evidence of stenosis noted.   ABI/TBI Today's ABI      Today's TBI Previous ABI     Previous TBI  +-------+----------------+-----------+----------------+------------+  Right   Non compressible 0.54        Non compressible 0.48          +-------+----------------+-----------+----------------+------------+  Left    Non compressible 0.51        Non compressible 0.0           +-------+----------------+-----------+----------------+------------+   MEDICAL ISSUES:   BPG is widely patent.  I am concerned about his 2nd toe wound.  This will most likely need to be amputated, however, I will observe it for another 3 weeks before making that decision  Annamarie Major, IV, MD, FACS [facility] [phone] Pager [phone]

## 2018-10-08 ENCOUNTER — Telehealth: Payer: Self-pay | Admitting: Oncology

## 2018-10-08 NOTE — Telephone Encounter (Signed)
5/22 MD only at 9:30 am

## 2018-10-08 NOTE — Telephone Encounter (Signed)
Patient left message requesting appointment. No scheduling requests via los or schedule message since last visit 4/1. Message routed to [facility] re patient's request for an appointment. Spoke with patient he is aware.

## 2018-10-08 NOTE — Telephone Encounter (Signed)
Returned call to patient re f/u 5/22 @ 9:30 am. Date/time per response from FS. Not able to reach patient left message at both home/cell re f/u. See previous note.

## 2018-10-11 DIAGNOSIS — N401 Enlarged prostate with lower urinary tract symptoms: Secondary | ICD-10-CM | POA: Diagnosis not present

## 2018-10-11 DIAGNOSIS — Z1389 Encounter for screening for other disorder: Secondary | ICD-10-CM | POA: Diagnosis not present

## 2018-10-11 DIAGNOSIS — I2510 Atherosclerotic heart disease of native coronary artery without angina pectoris: Secondary | ICD-10-CM | POA: Diagnosis not present

## 2018-10-11 DIAGNOSIS — I10 Essential (primary) hypertension: Secondary | ICD-10-CM | POA: Diagnosis not present

## 2018-10-11 DIAGNOSIS — E7800 Pure hypercholesterolemia, unspecified: Secondary | ICD-10-CM | POA: Diagnosis not present

## 2018-10-11 DIAGNOSIS — I779 Disorder of arteries and arterioles, unspecified: Secondary | ICD-10-CM | POA: Diagnosis not present

## 2018-10-11 DIAGNOSIS — N183 Chronic kidney disease, stage 3 (moderate): Secondary | ICD-10-CM | POA: Diagnosis not present

## 2018-10-11 DIAGNOSIS — E1151 Type 2 diabetes mellitus with diabetic peripheral angiopathy without gangrene: Secondary | ICD-10-CM | POA: Diagnosis not present

## 2018-10-11 DIAGNOSIS — E11319 Type 2 diabetes mellitus with unspecified diabetic retinopathy without macular edema: Secondary | ICD-10-CM | POA: Diagnosis not present

## 2018-10-11 DIAGNOSIS — I739 Peripheral vascular disease, unspecified: Secondary | ICD-10-CM | POA: Diagnosis not present

## 2018-10-11 DIAGNOSIS — M109 Gout, unspecified: Secondary | ICD-10-CM | POA: Diagnosis not present

## 2018-10-11 DIAGNOSIS — Z0000 Encounter for general adult medical examination without abnormal findings: Secondary | ICD-10-CM | POA: Diagnosis not present

## 2018-10-12 ENCOUNTER — Other Ambulatory Visit: Payer: Self-pay

## 2018-10-12 ENCOUNTER — Inpatient Hospital Stay: Payer: PPO | Attending: Oncology | Admitting: Oncology

## 2018-10-12 DIAGNOSIS — R918 Other nonspecific abnormal finding of lung field: Secondary | ICD-10-CM | POA: Diagnosis not present

## 2018-10-12 DIAGNOSIS — Z7982 Long term (current) use of aspirin: Secondary | ICD-10-CM | POA: Insufficient documentation

## 2018-10-12 DIAGNOSIS — C7989 Secondary malignant neoplasm of other specified sites: Secondary | ICD-10-CM | POA: Insufficient documentation

## 2018-10-12 DIAGNOSIS — Z79899 Other long term (current) drug therapy: Secondary | ICD-10-CM | POA: Diagnosis not present

## 2018-10-12 DIAGNOSIS — R911 Solitary pulmonary nodule: Secondary | ICD-10-CM

## 2018-10-12 DIAGNOSIS — C4492 Squamous cell carcinoma of skin, unspecified: Secondary | ICD-10-CM | POA: Insufficient documentation

## 2018-10-12 NOTE — Progress Notes (Signed)
Hematology and Oncology Follow Up Visit  Mario [name] [id number] July 06, 1940 78 y.o. 10/12/2018 9:30 AM Polite, [name], MDPolite, [name], MD   Principle Diagnosis: 78 year old man with:  1. 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  2.  Left upper lung mass diagnosed in February 2018.  Suspicious for a new lung neoplasm.   Prior Therapy:   Status post surgical excision of a right knee skin lesion.  Pathology showed squamous cell carcinoma in December 2019.  He is status post superficial inguinal lymph node dissection completed by Dr. Dalbert Batman on September 19, 2018.  Current therapy: Under consideration for further therapy for his metastatic squamous cell carcinoma as well as lung mass.  Interim History: Mario [name] returns today for a repeat evaluation.  Since the last visit, he underwent a lymph node dissection performed by Dr. Dalbert Batman without any major complications.  Since his operation, he reports no major changes in his health.  He is fully recovered at this time with residual left leg edema after he also completed left common femoral to below-knee popliteal artery bypass procedure.  He denies any respiratory complaints without any cough or shortness of breath.  He denies any dyspnea on exertion.   He denied headaches, blurry vision, syncope or seizures.  Denies any fevers, chills or sweats.  Denied chest pain, palpitation, orthopnea or leg edema.  Denied cough, wheezing or hemoptysis.  Denied nausea, vomiting or abdominal pain.  Denies any constipation or diarrhea.  Denies any frequency urgency or hesitancy.  Denies any arthralgias or myalgias.  Denies any skin rashes or lesions.  Denies any bleeding or clotting tendency.  Denies any easy bruising.  Denies any hair or nail changes.  Denies any anxiety or depression.  Remaining review of system is negative.      Medications: I have reviewed the patient's current medications.   Current Outpatient Medications  Medication Sig Dispense Refill  . acetaminophen (TYLENOL) 500 MG tablet Take 1,000 mg by mouth daily as needed for moderate pain.    Marland Kitchen allopurinol (ZYLOPRIM) 100 MG tablet Take 1 tablet by mouth daily 90 tablet 0  . ARTIFICIAL TEAR OP Apply 1 drop to eye daily as needed (dry eyes).    Marland Kitchen aspirin EC 81 MG tablet Take 81 mg by mouth daily.    Marland Kitchen atorvastatin (LIPITOR) 40 MG tablet take 1 tablet by mouth daily at 6:00pm  (Patient taking differently: Take 40 mg by mouth daily. ) 90 tablet 0  . Blood Glucose Monitoring Suppl (FREESTYLE LITE) DEVI USE TO TEST BLOOD SUGAR TWICE DAILY 1 each 1  . calcium carbonate (TUMS - DOSED IN MG ELEMENTAL CALCIUM) 500 MG chewable tablet Chew 1 tablet by mouth daily as needed for indigestion or heartburn.    . carvedilol (COREG) 3.125 MG tablet TAKE 1 TABLET BY MOUTH TWICE A DAY (Patient taking differently: Take 3.125 mg by mouth 2 (two) times daily with a meal. ) 180 tablet 3  . Cholecalciferol (VITAMIN D) 2000 units CAPS Take 2,000 Units by mouth daily.    . Coenzyme Q10 (COQ10) 200 MG CAPS Take 200 mg by mouth daily.    . fenofibrate micronized (ANTARA) 43 MG capsule Take 43 mg by mouth every evening.    . ferrous sulfate 325 (65 FE) MG tablet Take 325 mg by mouth daily.    . finasteride (PROSCAR) 5 MG tablet Take 5 mg by mouth every evening.   3  . folic  acid (FOLVITE) 800 MCG tablet Take 400 mcg by mouth daily.    Marland Kitchen glipiZIDE (GLUCOTROL XL) 10 MG 24 hr tablet TAKE 1 TABLET BY MOUTH AT BEDTIME FOR DIABETES. (Patient taking differently: Take 10 mg by mouth at bedtime. ) 30 tablet 2  . glucose blood (FREESTYLE LITE) test strip Use as instructed to check blood sugar 2 times per day dx code E11.65 100 each 3  . Homeopathic Products (LEG CRAMP RELIEF) TABS Take 1 tablet by mouth daily as needed (leg cramps).    . hydrocortisone cream 1 % Apply 1 application topically daily as needed (irritation).    . Lancets (FREESTYLE) lancets Use as  instructed to check blood sugar 2 times per day dx code E11.65 100 each 3  . lisinopril (PRINIVIL,ZESTRIL) 5 MG tablet Take 1 tablet (5 mg total) by mouth daily. 90 tablet 3  . metFORMIN (GLUCOPHAGE) 1000 MG tablet TAKE 1 TABLET BY MOUTH TWICE DAILY FOR BREAKFAST AND DINNER FOR DIABETES. (Patient taking differently: Take 1,000 mg by mouth 2 (two) times daily with a meal. ) 60 tablet 2  . miglitol (GLYSET) 50 MG tablet TAKE 1 TABLET BY MOUTH TWICE DAILY AT BREAKFAST AND DINNER FOR DIABETES. (Patient taking differently: Take 50 mg by mouth 2 (two) times daily. ) 60 tablet 2  . multivitamin-lutein (OCUVITE-LUTEIN) CAPS capsule Take 1 capsule by mouth 2 (two) times daily.    Marland Kitchen oxyCODONE (ROXICODONE) 5 MG immediate release tablet Take 1 tablet (5 mg total) by mouth every 6 (six) hours as needed. 30 tablet 0  . tamsulosin (FLOMAX) 0.4 MG CAPS capsule Take 0.4 mg by mouth 2 (two) times daily.    Marland Kitchen VICTOZA 18 MG/3ML SOPN INJECT 1.8MG  INTO THE SKIN DAILY AT DINNERTIME FOR DIABETES. (Patient taking differently: Inject 1.8 mg into the skin daily with supper. ) 9 mL 2  . vitamin B-12 (CYANOCOBALAMIN) 1000 MCG tablet Take 1,000 mcg by mouth daily.     No current facility-administered medications for this visit.      Allergies:  Allergies  Allergen Reactions  . Penicillins Anaphylaxis    Has patient had a PCN reaction causing immediate rash, facial/tongue/throat swelling, SOB or lightheadedness with hypotension: Yes Has patient had a PCN reaction causing severe rash involving mucus membranes or skin necrosis: No Has patient had a PCN reaction that required hospitalization: Yes Has patient had a PCN reaction occurring within the last 10 years: Unknown If all of the above answers are "NO", then may proceed with Cephalosporin use.   Marland Kitchen Plavix [Clopidogrel Bisulfate] Hives  . Pletal [Cilostazol] Itching    Past Medical History, Surgical history, Social history, and Family History were reviewed and  updated.    Physical Exam:   ECOG: 1   General appearance: Comfortable appearing without any discomfort Head: Normocephalic without any trauma Oropharynx: Mucous membranes are moist and pink without any thrush or ulcers. Eyes: Pupils are equal and round reactive to light. Lymph nodes: No cervical, supraclavicular, inguinal or axillary lymphadenopathy.   Heart:regular rate and rhythm.  S1 and S2.  Left leg edema compared to right. Lung: Clear without any rhonchi or wheezes.  No dullness to percussion. Abdomin: Soft, nontender, nondistended with good bowel sounds.  No hepatosplenomegaly. Musculoskeletal: No joint deformity or effusion.  Full range of motion noted. Neurological: No deficits noted on motor, sensory and deep tendon reflex exam. Skin: No petechial rash or dryness.  Appeared moist.       Lab Results: Lab Results  Component Value  Date   WBC 8.7 09/20/2018   HGB 7.4 (L) 09/20/2018   HCT 23.2 (L) 09/20/2018   MCV 88.5 09/20/2018   PLT 157 09/20/2018     Chemistry      Component Value Date/Time   NA 137 09/19/2018 0347   K 4.1 09/19/2018 0347   CL 108 09/19/2018 0347   CO2 21 (L) 09/19/2018 0347   BUN 28 (H) 09/19/2018 0347   CREATININE 1.35 (H) 09/19/2018 0347   CREATININE 1.14 08/13/2013 1043      Component Value Date/Time   CALCIUM 8.4 (L) 09/19/2018 0347   ALKPHOS 85 09/13/2018 1129   AST 28 09/13/2018 1129   ALT 30 09/13/2018 1129   BILITOT 0.6 09/13/2018 1129        Impression and Plan:  78 year old man with the following:  1.  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  He is status post inguinal lymph node dissection completed by Dr. Dalbert Batman in April 2020.  The natural course of this disease and additional treatment options were reviewed.  Options at this time would include active surveillance versus systemic therapy with Libtayo.  Complication associated with this therapy was reviewed  today  I recommended repeating imaging studies first and using systemic therapy if as there is clear evidence of metastatic squamous cell carcinoma.    2. Left upper lung mass diagnosed in February 2020: He underwent pulmonary evaluation and was under consideration for possible biopsy.  I recommend repeating a PET CT scan in the immediate future with consideration for proceeding with obtaining tissue biopsy via bronchoscopy, percutaneous approach or lung surgery.  Definitive therapy with radiation would be also an option if pathology could not be confirmed.     3.  Follow-up: To be determined pending the results of his PET scan.    25 minutes was spent with the patient face-to-face today.  More than 50% of time was dedicated to reviewing his disease status, treatment options answering questions regarding future plan of care.     [name], MD 5/22/20209:30 AM

## 2018-10-19 ENCOUNTER — Telehealth (HOSPITAL_COMMUNITY): Payer: Self-pay | Admitting: Rehabilitation

## 2018-10-19 NOTE — Telephone Encounter (Signed)
The above patient or their representative was contacted and gave the following answers to these questions:         Do you have any of the following symptoms? No  Fever                    Cough                   Shortness of breath  Do  you have any of the following other symptoms? No   muscle pain         vomiting,        diarrhea        rash         weakness        red eye        abdominal pain         bruising          bruising or bleeding              joint pain           severe headache    Have you been in contact with someone who was or has been sick in the past 2 weeks? No  Yes                 Unsure                         Unable to assess   Does the person that you were in contact with have any of the following symptoms?   Cough         shortness of breath           muscle pain         vomiting,            diarrhea            rash            weakness           fever            red eye           abdominal pain           bruising  or  bleeding                joint pain                severe headache               Have you  or someone you have been in contact with traveled internationally in th last month? No        If yes, which countries?   Have you  or someone you have been in contact with traveled outside [place] in th last month? No         If yes, which state and city?   COMMENTS OR ACTION PLAN FOR THIS PATIENT:          

## 2018-10-22 ENCOUNTER — Other Ambulatory Visit: Payer: Self-pay

## 2018-10-22 ENCOUNTER — Ambulatory Visit (INDEPENDENT_AMBULATORY_CARE_PROVIDER_SITE_OTHER): Payer: Self-pay | Admitting: Surgery

## 2018-10-22 ENCOUNTER — Encounter: Payer: Self-pay | Admitting: Surgery

## 2018-10-22 VITALS — BP 144/74 | HR 74 | Temp 97.3°F | Resp 20 | Ht 74.0 in | Wt 164.9 lb

## 2018-10-22 DIAGNOSIS — L97523 Non-pressure chronic ulcer of other part of left foot with necrosis of muscle: Secondary | ICD-10-CM

## 2018-10-22 DIAGNOSIS — C44722 Squamous cell carcinoma of skin of right lower limb, including hip: Secondary | ICD-10-CM | POA: Diagnosis not present

## 2018-10-22 MED ORDER — CLINDAMYCIN HCL 150 MG PO CAPS: 150.0000 mg | ORAL_CAPSULE | Freq: Three times a day (TID) | ORAL | 0 refills | Status: AC

## 2018-10-22 NOTE — Progress Notes (Signed)
Patient name: Mario [name] MRN: [id number] DOB: January 22, 1941 Sex: male  REASON FOR VISIT:     post op  HISTORY OF PRESENT ILLNESS:    Mario [name] is a 78 y.o. male who returns today for follow up..  On 09/18/2018, he underwent left iliofemoral endarterectomy with vein patch angioplasty and left common femoral to below-knee popliteal artery bypass graft with a 6 mm external ring PTFE graft.  I also performed a left popliteal endarterectomy.  He had extensive exophytic plaque from the distal external iliac artery into the common femoral and profundofemoral artery.  The endarterectomy in the below-knee popliteal artery went down to the level of the anterior tibial and tibioperoneal trunk.  His saphenous vein was not adequate for bypass.  This was all done for nonhealing wound on his left toe.  CURRENT MEDICATIONS:    Current Outpatient Medications  Medication Sig Dispense Refill  . acetaminophen (TYLENOL) 500 MG tablet Take 1,000 mg by mouth daily as needed for moderate pain.    Marland Kitchen allopurinol (ZYLOPRIM) 100 MG tablet Take 1 tablet by mouth daily 90 tablet 0  . ARTIFICIAL TEAR OP Apply 1 drop to eye daily as needed (dry eyes).    Marland Kitchen aspirin EC 81 MG tablet Take 81 mg by mouth daily.    Marland Kitchen atorvastatin (LIPITOR) 40 MG tablet take 1 tablet by mouth daily at 6:00pm  (Patient taking differently: Take 40 mg by mouth daily. ) 90 tablet 0  . Blood Glucose Monitoring Suppl (FREESTYLE LITE) DEVI USE TO TEST BLOOD SUGAR TWICE DAILY 1 each 1  . calcium carbonate (TUMS - DOSED IN MG ELEMENTAL CALCIUM) 500 MG chewable tablet Chew 1 tablet by mouth daily as needed for indigestion or heartburn.    . carvedilol (COREG) 3.125 MG tablet TAKE 1 TABLET BY MOUTH TWICE A DAY (Patient taking differently: Take 3.125 mg by mouth 2 (two) times daily with a meal. ) 180 tablet 3  . Cholecalciferol (VITAMIN D) 2000 units CAPS Take 2,000 Units by mouth daily.    . Coenzyme Q10  (COQ10) 200 MG CAPS Take 200 mg by mouth daily.    . fenofibrate micronized (ANTARA) 43 MG capsule Take 43 mg by mouth every evening.    . ferrous sulfate 325 (65 FE) MG tablet Take 325 mg by mouth daily.    . finasteride (PROSCAR) 5 MG tablet Take 5 mg by mouth every evening.   3  . folic acid (FOLVITE) 756 MCG tablet Take 400 mcg by mouth daily.    Marland Kitchen glipiZIDE (GLUCOTROL XL) 10 MG 24 hr tablet TAKE 1 TABLET BY MOUTH AT BEDTIME FOR DIABETES. (Patient taking differently: Take 10 mg by mouth at bedtime. ) 30 tablet 2  . glucose blood (FREESTYLE LITE) test strip Use as instructed to check blood sugar 2 times per day dx code E11.65 100 each 3  . Homeopathic Products (LEG CRAMP RELIEF) TABS Take 1 tablet by mouth daily as needed (leg cramps).    . hydrocortisone cream 1 % Apply 1 application topically daily as needed (irritation).    . Lancets (FREESTYLE) lancets Use as instructed to check blood sugar 2 times per day dx code E11.65 100 each 3  . lisinopril (PRINIVIL,ZESTRIL) 5 MG tablet Take 1 tablet (5 mg total) by  mouth daily. 90 tablet 3  . metFORMIN (GLUCOPHAGE) 1000 MG tablet TAKE 1 TABLET BY MOUTH TWICE DAILY FOR BREAKFAST AND DINNER FOR DIABETES. (Patient taking differently: Take 1,000 mg by mouth 2 (two) times daily with a meal. ) 60 tablet 2  . miglitol (GLYSET) 50 MG tablet TAKE 1 TABLET BY MOUTH TWICE DAILY AT BREAKFAST AND DINNER FOR DIABETES. (Patient taking differently: Take 50 mg by mouth 2 (two) times daily. ) 60 tablet 2  . multivitamin-lutein (OCUVITE-LUTEIN) CAPS capsule Take 1 capsule by mouth 2 (two) times daily.    Marland Kitchen oxyCODONE (ROXICODONE) 5 MG immediate release tablet Take 1 tablet (5 mg total) by mouth every 6 (six) hours as needed. 30 tablet 0  . tamsulosin (FLOMAX) 0.4 MG CAPS capsule Take 0.4 mg by mouth 2 (two) times daily.    Marland Kitchen VICTOZA 18 MG/3ML SOPN INJECT 1.8MG  INTO THE SKIN DAILY AT DINNERTIME FOR DIABETES. (Patient taking differently: Inject 1.8 mg into the skin daily  with supper. ) 9 mL 2  . vitamin B-12 (CYANOCOBALAMIN) 1000 MCG tablet Take 1,000 mcg by mouth daily.     No current facility-administered medications for this visit.     REVIEW OF SYSTEMS:   [X]  denotes positive finding, [ ]  denotes negative finding Cardiac  Comments:  Chest pain or chest pressure:    Shortness of breath upon exertion:    Short of breath when lying flat:    Irregular heart rhythm:    Constitutional    Fever or chills:      PHYSICAL EXAM:   Vitals:   10/22/18 1144  BP: (!) 144/74  Pulse: 74  Resp: 20  Temp: (!) 97.3 F (36.3 C)  SpO2: 100%  Weight: 74.8 kg  Height: 6\' 2"  (1.88 m)    GENERAL: The patient is a well-nourished male, in no acute distress. The vital signs are documented above. CARDIOVASCULAR: There is a regular rate and rhythm. PULMONARY: Non-labored respirations Brisk dorsalis pedis and posterior tibial Doppler signal  STUDIES:   None   MEDICAL ISSUES:   I am still concerned about his left second toe.  It does appear to be more edematous.  There is hyper granulation tissue at the tip which I cauterized with silver nitrate.  I think he is a very high risk for requiring a second toe amputation.  He is a little reluctant to proceed at this time.  And refer him to the wound center for continued management.  I am also placing him on antibiotics for 6 weeks in case there is some underlying osteomyelitis.  I have him scheduled to follow-up with me again in 6 weeks I will repeat his duplex of his bypass at that time.  Leia Alf, MD, FACS [facility] [phone] Pager [phone]

## 2018-10-26 ENCOUNTER — Other Ambulatory Visit: Payer: Self-pay

## 2018-10-26 ENCOUNTER — Encounter (HOSPITAL_BASED_OUTPATIENT_CLINIC_OR_DEPARTMENT_OTHER): Payer: PPO | Attending: Internal Medicine

## 2018-10-26 DIAGNOSIS — E1151 Type 2 diabetes mellitus with diabetic peripheral angiopathy without gangrene: Secondary | ICD-10-CM | POA: Diagnosis not present

## 2018-10-26 DIAGNOSIS — N183 Chronic kidney disease, stage 3 (moderate): Secondary | ICD-10-CM | POA: Diagnosis not present

## 2018-10-26 DIAGNOSIS — E1140 Type 2 diabetes mellitus with diabetic neuropathy, unspecified: Secondary | ICD-10-CM | POA: Diagnosis not present

## 2018-10-26 DIAGNOSIS — I10 Essential (primary) hypertension: Secondary | ICD-10-CM | POA: Insufficient documentation

## 2018-10-26 DIAGNOSIS — E11319 Type 2 diabetes mellitus with unspecified diabetic retinopathy without macular edema: Secondary | ICD-10-CM | POA: Diagnosis not present

## 2018-10-26 DIAGNOSIS — I2510 Atherosclerotic heart disease of native coronary artery without angina pectoris: Secondary | ICD-10-CM | POA: Diagnosis not present

## 2018-10-26 DIAGNOSIS — N401 Enlarged prostate with lower urinary tract symptoms: Secondary | ICD-10-CM | POA: Diagnosis not present

## 2018-10-26 DIAGNOSIS — E7800 Pure hypercholesterolemia, unspecified: Secondary | ICD-10-CM | POA: Diagnosis not present

## 2018-10-26 DIAGNOSIS — E7849 Other hyperlipidemia: Secondary | ICD-10-CM | POA: Diagnosis not present

## 2018-10-26 DIAGNOSIS — E782 Mixed hyperlipidemia: Secondary | ICD-10-CM | POA: Diagnosis not present

## 2018-10-26 DIAGNOSIS — L97525 Non-pressure chronic ulcer of other part of left foot with muscle involvement without evidence of necrosis: Secondary | ICD-10-CM | POA: Diagnosis not present

## 2018-10-26 DIAGNOSIS — M86672 Other chronic osteomyelitis, left ankle and foot: Secondary | ICD-10-CM | POA: Diagnosis not present

## 2018-10-26 DIAGNOSIS — E11621 Type 2 diabetes mellitus with foot ulcer: Secondary | ICD-10-CM | POA: Diagnosis present

## 2018-10-26 DIAGNOSIS — L97522 Non-pressure chronic ulcer of other part of left foot with fat layer exposed: Secondary | ICD-10-CM | POA: Diagnosis not present

## 2018-10-26 DIAGNOSIS — E1122 Type 2 diabetes mellitus with diabetic chronic kidney disease: Secondary | ICD-10-CM | POA: Diagnosis not present

## 2018-10-29 ENCOUNTER — Ambulatory Visit (HOSPITAL_COMMUNITY)
Admission: RE | Admit: 2018-10-29 | Discharge: 2018-10-29 | Disposition: A | Discharge status: Home / Self Care | Payer: PPO | Source: Ambulatory Visit | Attending: Oncology | Admitting: Oncology

## 2018-10-29 ENCOUNTER — Ambulatory Visit (HOSPITAL_COMMUNITY)
Admission: RE | Admit: 2018-10-29 | Discharge: 2018-10-29 | Disposition: A | Discharge status: Home / Self Care | Payer: PPO | Source: Ambulatory Visit | Attending: Internal Medicine | Admitting: Internal Medicine

## 2018-10-29 ENCOUNTER — Other Ambulatory Visit (HOSPITAL_COMMUNITY): Payer: Self-pay | Admitting: Internal Medicine

## 2018-10-29 ENCOUNTER — Other Ambulatory Visit: Payer: Self-pay

## 2018-10-29 DIAGNOSIS — M869 Osteomyelitis, unspecified: Secondary | ICD-10-CM | POA: Insufficient documentation

## 2018-10-29 DIAGNOSIS — R911 Solitary pulmonary nodule: Secondary | ICD-10-CM | POA: Insufficient documentation

## 2018-10-29 DIAGNOSIS — S91302A Unspecified open wound, left foot, initial encounter: Secondary | ICD-10-CM | POA: Diagnosis not present

## 2018-10-29 DIAGNOSIS — E1169 Type 2 diabetes mellitus with other specified complication: Secondary | ICD-10-CM | POA: Diagnosis present

## 2018-10-29 DIAGNOSIS — L97509 Non-pressure chronic ulcer of other part of unspecified foot with unspecified severity: Secondary | ICD-10-CM

## 2018-10-29 DIAGNOSIS — E11621 Type 2 diabetes mellitus with foot ulcer: Secondary | ICD-10-CM | POA: Insufficient documentation

## 2018-10-29 DIAGNOSIS — C779 Secondary and unspecified malignant neoplasm of lymph node, unspecified: Secondary | ICD-10-CM | POA: Diagnosis not present

## 2018-10-29 DIAGNOSIS — C4492 Squamous cell carcinoma of skin, unspecified: Secondary | ICD-10-CM | POA: Diagnosis not present

## 2018-10-29 LAB — GLUCOSE, CAPILLARY: Glucose-Capillary: 96 mg/dL (ref 70–99)

## 2018-10-29 MED ORDER — FLUDEOXYGLUCOSE F - 18 (FDG) INJECTION
8.9000 | Freq: Once | INTRAVENOUS | Status: AC | PRN
  Administered 2018-10-29: 8.9 via INTRAVENOUS

## 2018-10-30 ENCOUNTER — Other Ambulatory Visit: Payer: Self-pay | Admitting: Oncology

## 2018-10-30 DIAGNOSIS — R911 Solitary pulmonary nodule: Secondary | ICD-10-CM

## 2018-10-31 NOTE — Progress Notes (Signed)
Thoracic Location of Tumor / Histology: Anterior left upper lobe 5.4 cm hypermetabolic lung mass (max SUV 11.3), increased in size and metabolism since 07/10/2018 PET-CT. Primary bronchogenic carcinoma remains the diagnosis of exclusion.  1. 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  2.  Left upper lung mass diagnosed in February 2018.  Suspicious for a new lung neoplasm.   Tobacco/Marijuana/Snuff/ETOH use: quit smoking in 2016, pt started smoking at age 9.   Pt quit ETOH consumption approximately 15 years ago.  Pt denies illicit drug use.  Past/Anticipated interventions by cardiothoracic surgery, if any: None at this time.  Past/Anticipated interventions by medical oncology, if any: Per Dr. [name] 10/12/18:  Impression and Plan:  78 year old man with the following:  1.  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  He is status post inguinal lymph node dissection completed by Dr. Dalbert Batman in April 2020.  The natural course of this disease and additional treatment options were reviewed.  Options at this time would include active surveillance versus systemic therapy with Libtayo.  Complication associated with this therapy was reviewed today  I recommended repeating imaging studies first and using systemic therapy if as there is clear evidence of metastatic squamous cell carcinoma.    2. Left upper lung mass diagnosed in February 2020: He underwent pulmonary evaluation and was under consideration for possible biopsy.  I recommend repeating a PET CT scan in the immediate future with consideration for proceeding with obtaining tissue biopsy via bronchoscopy, percutaneous approach or lung surgery.  Definitive therapy with radiation would be also an option if pathology could not be confirmed.     3. Follow-up: To be determined pending the results of his PET  scan  Signs/Symptoms Weight changes, if any:  Wt Readings from Last 3 Encounters:  11/01/18 163 lb 8 oz (74.2 kg)  10/22/18 164 lb 14.4 oz (74.8 kg)  10/12/18 167 lb 8 oz (76 kg)       Respiratory complaints, if any: Pt is c/o difficulty breathing with mask and is observed taking deep breaths through mouth. Pt frequently rearranges mask and will have nose out of mask.   He denies any respiratory complaints without any cough or shortness of breath.  He denies any dyspnea on exertion. Denies any fevers, chills or sweats.  Denied chest pain, palpitation, orthopnea or leg edema.  Denied cough, wheezing or hemoptysis.   Hemoptysis, if any: Denies  Pain issues, if any:  Pt reports intermittent pain in LEFT second toe due to "infection in the bone"  SAFETY ISSUES:  Prior radiation? No  Pacemaker/ICD? No  Possible current pregnancy? N/A  Is the patient on methotrexate? No  Current Complaints / other details:  Pt presents today for initial consult with Dr. Sondra Come for Radiation Oncology.   BP 137/66 (BP Location: Left Arm, Patient Position: Standing)   Pulse 78   Temp 99 F (37.2 C) (Temporal)   Resp 18   Ht 6\' 2"  (1.88 m)   Wt 163 lb 8 oz (74.2 kg)   SpO2 100%   BMI 20.99 kg/m   Loma Sousa, RN BSN

## 2018-11-01 ENCOUNTER — Ambulatory Visit
Admission: RE | Admit: 2018-11-01 | Discharge: 2018-11-01 | Disposition: A | Discharge status: Home / Self Care | Payer: PPO | Source: Ambulatory Visit | Attending: Radiation Oncology | Admitting: Radiation Oncology

## 2018-11-01 ENCOUNTER — Other Ambulatory Visit: Payer: Self-pay

## 2018-11-01 ENCOUNTER — Encounter: Payer: Self-pay | Admitting: Radiation Oncology

## 2018-11-01 ENCOUNTER — Telehealth: Payer: Self-pay | Admitting: *Deleted

## 2018-11-01 DIAGNOSIS — M129 Arthropathy, unspecified: Secondary | ICD-10-CM | POA: Diagnosis not present

## 2018-11-01 DIAGNOSIS — C774 Secondary and unspecified malignant neoplasm of inguinal and lower limb lymph nodes: Secondary | ICD-10-CM | POA: Diagnosis not present

## 2018-11-01 DIAGNOSIS — Z79899 Other long term (current) drug therapy: Secondary | ICD-10-CM | POA: Insufficient documentation

## 2018-11-01 DIAGNOSIS — I255 Ischemic cardiomyopathy: Secondary | ICD-10-CM | POA: Insufficient documentation

## 2018-11-01 DIAGNOSIS — E1165 Type 2 diabetes mellitus with hyperglycemia: Secondary | ICD-10-CM | POA: Insufficient documentation

## 2018-11-01 DIAGNOSIS — Z85828 Personal history of other malignant neoplasm of skin: Secondary | ICD-10-CM | POA: Insufficient documentation

## 2018-11-01 DIAGNOSIS — K5730 Diverticulosis of large intestine without perforation or abscess without bleeding: Secondary | ICD-10-CM | POA: Diagnosis not present

## 2018-11-01 DIAGNOSIS — R918 Other nonspecific abnormal finding of lung field: Secondary | ICD-10-CM

## 2018-11-01 DIAGNOSIS — C3412 Malignant neoplasm of upper lobe, left bronchus or lung: Secondary | ICD-10-CM | POA: Insufficient documentation

## 2018-11-01 DIAGNOSIS — I129 Hypertensive chronic kidney disease with stage 1 through stage 4 chronic kidney disease, or unspecified chronic kidney disease: Secondary | ICD-10-CM | POA: Diagnosis not present

## 2018-11-01 DIAGNOSIS — I739 Peripheral vascular disease, unspecified: Secondary | ICD-10-CM | POA: Insufficient documentation

## 2018-11-01 DIAGNOSIS — R911 Solitary pulmonary nodule: Secondary | ICD-10-CM

## 2018-11-01 DIAGNOSIS — I779 Disorder of arteries and arterioles, unspecified: Secondary | ICD-10-CM

## 2018-11-01 DIAGNOSIS — M109 Gout, unspecified: Secondary | ICD-10-CM | POA: Insufficient documentation

## 2018-11-01 DIAGNOSIS — E119 Type 2 diabetes mellitus without complications: Secondary | ICD-10-CM | POA: Diagnosis not present

## 2018-11-01 DIAGNOSIS — I2510 Atherosclerotic heart disease of native coronary artery without angina pectoris: Secondary | ICD-10-CM | POA: Diagnosis not present

## 2018-11-01 DIAGNOSIS — Z9049 Acquired absence of other specified parts of digestive tract: Secondary | ICD-10-CM | POA: Diagnosis not present

## 2018-11-01 DIAGNOSIS — N182 Chronic kidney disease, stage 2 (mild): Secondary | ICD-10-CM | POA: Insufficient documentation

## 2018-11-01 DIAGNOSIS — Z87891 Personal history of nicotine dependence: Secondary | ICD-10-CM | POA: Insufficient documentation

## 2018-11-01 DIAGNOSIS — Z7982 Long term (current) use of aspirin: Secondary | ICD-10-CM | POA: Diagnosis not present

## 2018-11-01 DIAGNOSIS — I6523 Occlusion and stenosis of bilateral carotid arteries: Secondary | ICD-10-CM

## 2018-11-01 DIAGNOSIS — L7633 Postprocedural seroma of skin and subcutaneous tissue following a dermatologic procedure: Secondary | ICD-10-CM | POA: Diagnosis not present

## 2018-11-01 NOTE — Telephone Encounter (Signed)
Oncology Nurse Navigator Documentation  Oncology Nurse Navigator Flowsheets 11/01/2018  Navigator Location [facility]  Referral date to RadOnc/MedOnc 11/01/2018  Navigator Encounter Type Telephone/I received referral from Dr. Sondra Come today.  I called patient to set up for [facility] next week. He verbalized understanding of appt time and place.   Telephone Outgoing Call  Treatment Phase Pre-Tx/Tx Discussion  Barriers/Navigation Needs Education;Coordination of Care  Education Other  Interventions Coordination of Care;Education  Coordination of Care Appts  Acuity Level 2  Time Spent with Patient 30

## 2018-11-01 NOTE — Patient Instructions (Signed)
Coronavirus (COVID-19) Are you at risk?  Are you at risk for the Coronavirus (COVID-19)?  To be considered HIGH RISK for Coronavirus (COVID-19), you have to meet the following criteria:  . Traveled to [place], [place], [place], [place] or [place]; or in the [place] to [place], [place], [place], or [place]; and have fever, cough, and shortness of breath within the last 2 weeks of travel OR . Been in close contact with a person diagnosed with COVID-19 within the last 2 weeks and have fever, cough, and shortness of breath . IF YOU DO NOT MEET THESE CRITERIA, YOU ARE CONSIDERED LOW RISK FOR COVID-19.  What to do if you are HIGH RISK for COVID-19?  . If you are having a medical emergency, call 911. . Seek medical care right away. Before you go to a doctor's office, urgent care or emergency department, call ahead and tell them about your recent travel, contact with someone diagnosed with COVID-19, and your symptoms. You should receive instructions from your physician's office regarding next steps of care.  . When you arrive at healthcare provider, tell the healthcare staff immediately you have returned from visiting [place], [place], [place], [place] or [place]; or traveled in the [place] to [place], [place], [place], or [place]; in the last two weeks or you have been in close contact with a person diagnosed with COVID-19 in the last 2 weeks.   . Tell the health care staff about your symptoms: fever, cough and shortness of breath. . After you have been seen by a medical provider, you will be either: o Tested for (COVID-19) and discharged home on quarantine except to seek medical care if symptoms worsen, and asked to  - Stay home and avoid contact with others until you get your results (4-5 days)  - Avoid travel on public transportation if possible (such as bus, train, or airplane) or o Sent to the Emergency Department by EMS for evaluation, COVID-19 testing, and possible  admission depending on your condition and test results.  What to do if you are LOW RISK for COVID-19?  Reduce your risk of any infection by using the same precautions used for avoiding the common cold or flu:  . Wash your hands often with soap and warm water for at least 20 seconds.  If soap and water are not readily available, use an alcohol-based hand sanitizer with at least 60% alcohol.  . If coughing or sneezing, cover your mouth and nose by coughing or sneezing into the elbow areas of your shirt or coat, into a tissue or into your sleeve (not your hands). . Avoid shaking hands with others and consider head nods or verbal greetings only. . Avoid touching your eyes, nose, or mouth with unwashed hands.  . Avoid close contact with people who are sick. . Avoid places or events with large numbers of people in one location, like concerts or sporting events. . Carefully consider travel plans you have or are making. . If you are planning any travel outside or inside the [place], visit the CDC's Travelers' Health webpage for the latest health notices. . If you have some symptoms but not all symptoms, continue to monitor at home and seek medical attention if your symptoms worsen. . If you are having a medical emergency, call 911.   ADDITIONAL HEALTHCARE OPTIONS FOR PATIENTS  [facility] / e-Visit: [email or web address]         [facility]: [phone]  [facility]: [phone]                   [facility]: [phone]   

## 2018-11-01 NOTE — Progress Notes (Signed)
Radiation Oncology         ([phone]) [phone] ________________________________  Initial Outpatient Consultation  Name: Mario [name] MRN: [id number]  Date: 11/01/2018  DOB: Jun 20, 1940  XB:JYNWGN, [name], MD  Wyatt Portela, MD   REFERRING PHYSICIAN: Wyatt Portela, MD  DIAGNOSIS: The encounter diagnosis was Mass of upper lobe of left lung.   Presumptive Stage IIb (T3a, N0, M0) non-small cell lung cancer presenting in the left upper lung area  HISTORY OF PRESENT ILLNESS::Mario [name] is a 78 y.o. male who is seen today at the request of Dr. [name]. The patient is a native of [place] that currently lives in this area after relocating for the past 6 years. He has a PMH of coronary disease, gout, and diabetes. He also has a history of recurrent skin cancer, including basal cell and squamous cell carcinoma. He did undergo a shave biopsy of a skin lesion in December 2019, performed by Dr. [name], which revealed squamous cell carcinoma. Around the same time, the patient presented to his PCP with right groin enlargement. Pelvic ultrasound identified a 2.7 x 1.8 x 4 cm solid hypoechoic mass in the right groin corresponding to the palpable mass.   He was subsequently referred to Dr. Dalbert Batman. Ultrasound-guided core biopsy of the enlarged right inguinal lymph node on 06/22/2018 confirmed the presence of squamous cell carcinoma.   Staging C/A/P scans from 06/29/2018 showed the pathologically enlarged right inguinal lymph node, measuring 2.1 cm. No other adenopathy was identified. However, a non-specific, airspace opacity was noted in the left upper lobe. This was further evaluated with PET scan on 07/10/2018 which showed hypermetabolism (max SUV 9.2) associated with an irregular 4.1 cm mass-like focus of consolidation in the anterior left upper lung lobe. There was no hypermetabolic thoracic adenopathy. Also seen was the enlarged hypermetabolic right inguinal lymph node, compatible with known metastatic  disease. No additional sites of hypermetabolic distant metastatic disease.   Based on these findings, he was referred to Dr. [name] for treatment recommendations. He underwent right superficial inguinal lymph node dissection with Dr. Dalbert Batman on 09/19/2018 without any major complications. 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 Since his operation, he reports no major changes in his health. He is fully recovered at this time with residual left leg edema after he also completed left common femoral to below-knee popliteal artery bypass procedure. Per Dr. [name], additional treatment options at this time would include active surveillance versus systemic therapy with Libtayo for the patient's recurrent skin cancer.  He also underwent pulmonary evaluation for his lung mass and has been under consideration for possible biopsy. Dr. [name] recommended repeating PET-CT with consideration for proceeding with tissue biopsy via bronchoscopy, percutaneous approach, lung surgery, or radiation. Repeat PET scan from 10/29/2018 showed hypermetabolic activity associated with the anterior left upper lobe lung mass, measuring 5.4 cm with max SUV 11.3, increased in size and metabolism since 07/10/2018 PET-CT. Primary bronchogenic carcinoma remains the diagnosis of exclusion. No hypermetabolic thoracic adenopathy or distant metastatic disease. There was low level uptake associated with low-attenuation fluid collections in the left greater than right inguinal regions, most compatible with postoperative seromas associated with right inguinal lymphadenectomy and left femoral-popliteal bypass graft surgery.  The patient has kindly been referred today for discussion of potential radiation treatment options for the left upper lung mass.  On review of systems, he denies any shortness of breath cough or hemoptysis.  He denies any pain in the chest area  PREVIOUS RADIATION THERAPY: No  PAST MEDICAL HISTORY:  has a past  medical history of Arthritis, CAD (coronary artery disease), Carotid stenosis, Chronic kidney disease, stage II (mild), Chronic systolic CHF (congestive heart failure) ([place]), Essential hypertension, benign, Gout, HLD (hyperlipidemia), Inguinal adenopathy (09/19/2018), Ischemic cardiomyopathy, Orthostasis, PAD (peripheral artery disease) ([place]), Pneumonia, and Type II or unspecified type diabetes mellitus without mention of complication, uncontrolled.    PAST SURGICAL HISTORY: Past Surgical History:  Procedure Laterality Date   ABDOMINAL AORTAGRAM N/A 10/09/2013   Procedure: ABDOMINAL AORTAGRAM;  Surgeon: Wellington Hampshire, MD;  Location: [facility];  Service: Cardiovascular;  Laterality: N/A;   ABDOMINAL AORTOGRAM N/A 12/28/2016   Procedure: ABDOMINAL AORTOGRAM;  Surgeon: Waynetta Sandy, MD;  Location: [facility];  Service: Cardiovascular;  Laterality: N/A;   ABDOMINAL AORTOGRAM W/LOWER EXTREMITY Bilateral 09/11/2018   Procedure: ABDOMINAL AORTOGRAM W/LOWER EXTREMITY;  Surgeon: Serafina Mitchell, MD;  Location: [facility];  Service: Cardiovascular;  Laterality: Bilateral;   CARDIAC CATHETERIZATION N/A 10/02/2014   Procedure: Left Heart Cath and Coronary Angiography;  Surgeon: Belva Crome, MD;  Location: [facility];  Service: Cardiovascular;  Laterality: N/A;   CATARACT EXTRACTION     CHOLECYSTECTOMY     COLONOSCOPY     CORONARY ARTERY BYPASS GRAFT N/A 11/03/2014   Procedure: CORONARY ARTERY BYPASS GRAFTING  times three using left internal mammary and right greater saphenous vein;  Surgeon: Gaye Pollack, MD;  Location: [facility];  Service: Open Heart Surgery;  Laterality: N/A;   FEMORAL-POPLITEAL BYPASS GRAFT Left 09/18/2018   Procedure: LEFT ILEO FEMORAL ENDARTERECTOMY WITH VEIN PATCH AND ANGIOPLASTY, POPLITEAL  ENDARTERECTOMY, FEM POP BYPASS;  Surgeon: Serafina Mitchell, MD;  Location: [facility];  Service: Vascular;  Laterality: Left;   HERNIA REPAIR     LOWER  EXTREMITY ANGIOGRAPHY Bilateral 12/28/2016   Procedure: Lower Extremity Angiography;  Surgeon: Waynetta Sandy, MD;  Location: [facility];  Service: Cardiovascular;  Laterality: Bilateral;   LYMPH NODE DISSECTION Right 09/19/2018   Procedure: SUPERFICIAL RIGHT LYMPH NODE DISSECTION;  Surgeon: Fanny Skates, MD;  Location: [facility];  Service: General;  Laterality: Right;   MELANOMA EXCISION     PERIPHERAL VASCULAR BALLOON ANGIOPLASTY Right 12/28/2016   Procedure: PERIPHERAL VASCULAR BALLOON ANGIOPLASTY;  Surgeon: Waynetta Sandy, MD;  Location: [facility];  Service: Cardiovascular;  Laterality: Right;  SFA UNSUCCESSFUL PTA  UNABLE TO CROSS LESION   SKIN LESION EXCISION     multiple   TEE WITHOUT CARDIOVERSION N/A 11/03/2014   Procedure: TRANSESOPHAGEAL ECHOCARDIOGRAM (TEE);  Surgeon: Gaye Pollack, MD;  Location: [facility];  Service: Open Heart Surgery;  Laterality: N/A;    FAMILY HISTORY: family history includes Colon cancer in his father; Heart disease in his mother; Non-Hodgkin's lymphoma in his daughter.  SOCIAL HISTORY:  reports that he quit smoking about 4 years ago. His smoking use included cigarettes. He has a 25.00 pack-year smoking history. He has never used smokeless tobacco. He reports that he does not drink alcohol or use drugs.  ALLERGIES: Penicillins, Plavix [clopidogrel bisulfate], and Pletal [cilostazol]  MEDICATIONS:  Current Outpatient Medications  Medication Sig Dispense Refill   acetaminophen (TYLENOL) 500 MG tablet Take 1,000 mg by mouth daily as needed for moderate pain.     allopurinol (ZYLOPRIM) 100 MG tablet Take 1 tablet by mouth daily 90 tablet 0   ARTIFICIAL TEAR OP Apply 1 drop to eye daily as needed (dry eyes).     aspirin EC 81  MG tablet Take 81 mg by mouth daily.     atorvastatin (LIPITOR) 40 MG tablet take 1 tablet by mouth daily at 6:00pm  (Patient taking differently: Take 40 mg by mouth daily. ) 90 tablet 0   Blood  Glucose Monitoring Suppl (FREESTYLE LITE) DEVI USE TO TEST BLOOD SUGAR TWICE DAILY 1 each 1   calcium carbonate (TUMS - DOSED IN MG ELEMENTAL CALCIUM) 500 MG chewable tablet Chew 1 tablet by mouth daily as needed for indigestion or heartburn.     carvedilol (COREG) 3.125 MG tablet TAKE 1 TABLET BY MOUTH TWICE A DAY (Patient taking differently: Take 3.125 mg by mouth 2 (two) times daily with a meal. ) 180 tablet 3   Cholecalciferol (VITAMIN D) 2000 units CAPS Take 2,000 Units by mouth daily.     clindamycin (CLEOCIN) 150 MG capsule Take 1 capsule (150 mg total) by mouth 3 (three) times daily. 40 capsule 0   Coenzyme Q10 (COQ10) 200 MG CAPS Take 200 mg by mouth daily.     fenofibrate micronized (ANTARA) 43 MG capsule Take 43 mg by mouth every evening.     ferrous sulfate 325 (65 FE) MG tablet Take 325 mg by mouth daily.     finasteride (PROSCAR) 5 MG tablet Take 5 mg by mouth every evening.   3   folic acid (FOLVITE) 426 MCG tablet Take 400 mcg by mouth daily.     glipiZIDE (GLUCOTROL XL) 10 MG 24 hr tablet TAKE 1 TABLET BY MOUTH AT BEDTIME FOR DIABETES. (Patient taking differently: Take 10 mg by mouth at bedtime. ) 30 tablet 2   glucose blood (FREESTYLE LITE) test strip Use as instructed to check blood sugar 2 times per day dx code E11.65 100 each 3   Homeopathic Products (LEG CRAMP RELIEF) TABS Take 1 tablet by mouth daily as needed (leg cramps).     hydrocortisone cream 1 % Apply 1 application topically daily as needed (irritation).     Lancets (FREESTYLE) lancets Use as instructed to check blood sugar 2 times per day dx code E11.65 100 each 3   lisinopril (PRINIVIL,ZESTRIL) 5 MG tablet Take 1 tablet (5 mg total) by mouth daily. 90 tablet 3   metFORMIN (GLUCOPHAGE) 1000 MG tablet TAKE 1 TABLET BY MOUTH TWICE DAILY FOR BREAKFAST AND DINNER FOR DIABETES. (Patient taking differently: Take 1,000 mg by mouth 2 (two) times daily with a meal. ) 60 tablet 2   miglitol (GLYSET) 50 MG tablet  TAKE 1 TABLET BY MOUTH TWICE DAILY AT BREAKFAST AND DINNER FOR DIABETES. (Patient taking differently: Take 50 mg by mouth 2 (two) times daily. ) 60 tablet 2   multivitamin-lutein (OCUVITE-LUTEIN) CAPS capsule Take 1 capsule by mouth 2 (two) times daily.     oxyCODONE (ROXICODONE) 5 MG immediate release tablet Take 1 tablet (5 mg total) by mouth every 6 (six) hours as needed. 30 tablet 0   tamsulosin (FLOMAX) 0.4 MG CAPS capsule Take 0.4 mg by mouth 2 (two) times daily.     VICTOZA 18 MG/3ML SOPN INJECT 1.8MG  INTO THE SKIN DAILY AT DINNERTIME FOR DIABETES. (Patient taking differently: Inject 1.8 mg into the skin daily with supper. ) 9 mL 2   vitamin B-12 (CYANOCOBALAMIN) 1000 MCG tablet Take 1,000 mcg by mouth daily.     No current facility-administered medications for this encounter.     REVIEW OF SYSTEMS:  REVIEW OF SYSTEMS: A 10+ POINT REVIEW OF SYSTEMS WAS OBTAINED including neurology, dermatology, psychiatry, cardiac, respiratory, lymph, extremities,  GI, GU, musculoskeletal, constitutional, reproductive, HEENT. All pertinent positives are noted in the HPI. All others are negative.   PHYSICAL EXAM:  height is 6\' 2"  (1.88 m) and weight is 163 lb 8 oz (74.2 kg). His temporal temperature is 99 F (37.2 C). His blood pressure is 137/66 and his pulse is 78. His respiration is 18 and oxygen saturation is 100%.   General: Alert and oriented, in no acute distress, thin appearance HEENT: Head is normocephalic. Extraocular movements are intact. Oropharynx is clear. Poor dentition Neck: Neck is supple, no palpable cervical or supraclavicular lymphadenopathy. Heart: Regular in rate and rhythm with no murmurs, rubs, or gallops. Chest: Clear to auscultation bilaterally, with no rhonchi, wheezes, or rales. Abdomen: Soft, nontender, nondistended, with no rigidity or guarding. Extremities: No cyanosis or edema. Lymphatics: see Neck Exam Skin: No concerning lesions. Musculoskeletal: symmetric strength  and muscle tone throughout. Neurologic: Cranial nerves II through XII are grossly intact. No obvious focalities. Speech is fluent. Coordination is intact. Psychiatric: Judgment and insight are intact. Affect is appropriate.   ECOG = 1  0 - Asymptomatic (Fully active, able to carry on all predisease activities without restriction)  1 - Symptomatic but completely ambulatory (Restricted in physically strenuous activity but ambulatory and able to carry out work of a light or sedentary nature. For example, light housework, office work)  2 - Symptomatic, <50% in bed during the day (Ambulatory and capable of all self care but unable to carry out any work activities. Up and about more than 50% of waking hours)  3 - Symptomatic, >50% in bed, but not bedbound (Capable of only limited self-care, confined to bed or chair 50% or more of waking hours)  4 - Bedbound (Completely disabled. Cannot carry on any self-care. Totally confined to bed or chair)  5 - Death   Eustace Pen MM, Creech RH, Tormey DC, et al. [phone]). "Toxicity and response criteria of the [facility]". [place] Oncol. 5 (6): 649-55  LABORATORY DATA:  Lab Results  Component Value Date   WBC 8.7 09/20/2018   HGB 7.4 (L) 09/20/2018   HCT 23.2 (L) 09/20/2018   MCV 88.5 09/20/2018   PLT 157 09/20/2018   NEUTROABS 5.1 09/18/2018   Lab Results  Component Value Date   NA 137 09/19/2018   K 4.1 09/19/2018   CL 108 09/19/2018   CO2 21 (L) 09/19/2018   GLUCOSE 231 (H) 09/19/2018   CREATININE 1.35 (H) 09/19/2018   CALCIUM 8.4 (L) 09/19/2018      RADIOGRAPHY: Nm Pet Image Restag (ps) Skull Base To Thigh  Result Date: 10/29/2018 CLINICAL DATA:  Subsequent treatment strategy for left upper lobe lung mass. History of right knee squamous cell skin carcinoma with metastatic disease to right inguinal lymph nodes, status post excisional biopsy of right inguinal nodes on 09/19/2018. Tissue sampling of the left upper lobe  lung mass has not yet been obtained. EXAM: NUCLEAR MEDICINE PET SKULL BASE TO THIGH TECHNIQUE: 8.9 mCi F-18 FDG was injected intravenously. Full-ring PET imaging was performed from the skull base to thigh after the radiotracer. CT data was obtained and used for attenuation correction and anatomic localization. Fasting blood glucose: 96 mg/dl COMPARISON:  [id number] PET-CT. 06/29/2018 CT chest, abdomen and pelvis. FINDINGS: Mediastinal blood pool activity: SUV max 1.7 Liver activity: SUV max NA NECK: No hypermetabolic lymph nodes in the neck. Incidental CT findings: none CHEST: Hypermetabolic predominantly solid 5.4 x 5.4 cm anterior left upper lobe lung mass (series 8/image  36) with max SUV 11.3, increased from 4.1 x 3.7 cm with max SUV 9.2. No additional hypermetabolic pulmonary findings. No hypermetabolic hilar, mediastinal or axillary lymph nodes. Incidental CT findings: Three-vessel coronary atherosclerosis status post CABG. Atherosclerotic thoracic aorta with stable ectatic 4.2 cm ascending thoracic aorta. Severe centrilobular emphysema. Subpleural 5 mm anterior left lower lobe solid pulmonary nodule (series 8/image 31), stable using similar measurement technique, probably benign. No new significant pulmonary nodules. Intact sternotomy wires. ABDOMEN/PELVIS: No abnormal hypermetabolic activity within the liver, pancreas, adrenal glands, or spleen. No hypermetabolic lymph nodes in the abdomen or pelvis. Diffuse bowel hypermetabolism without CT correlate, compatible with physiologic bowel activity. Low level uptake associated with low-attenuation fluid collections in the left inguinal region with surrounding fat stranding, largest measuring 3.3 x 3.1 cm (series 4/image 192), with new partially visualized left femoral-popliteal bypass graft. Surgical clips in the right inguinal region with associated 2.4 x 1.7 cm postoperative seroma (series 4/image 215). Atherosclerotic nonaneurysmal abdominal aorta. Diffuse colonic  diverticulosis. Incidental CT findings: Cholecystectomy. SKELETON: No focal hypermetabolic activity to suggest skeletal metastasis. Incidental CT findings: none IMPRESSION: 1. Anterior left upper lobe 5.4 cm hypermetabolic lung mass (max SUV 11.3), increased in size and metabolism since 07/10/2018 PET-CT. Primary bronchogenic carcinoma remains the diagnosis of exclusion. 2. No hypermetabolic thoracic adenopathy or distant metastatic disease. 3. Low level uptake associated with low-attenuation fluid collections in the left greater than right inguinal regions, most compatible with postoperative seromas associated with right inguinal lymphadenectomy and left femoral-popliteal bypass graft surgery. 4. Aortic Atherosclerosis (ICD10-I70.0) and Emphysema (ICD10-J43.9). Electronically Signed   By: Ilona Sorrel M.D.   On: 10/29/2018 15:01   Dg Foot Complete Left  Result Date: 10/29/2018 CLINICAL DATA:  Nonhealing wound of second toe. EXAM: LEFT FOOT - COMPLETE 3+ VIEW COMPARISON:  Radiographs of July 27, 2018. FINDINGS: There is lytic destruction of the second distal phalanx which has progressed since prior exam, consistent with osteomyelitis. Overlying soft tissue ulceration is noted. No other fracture or dislocation is noted. Vascular calcifications are noted. Mild degenerative changes seen involving the left first metatarsophalangeal joint. IMPRESSION: Progressive lytic destruction seen involving second distal phalanx consistent with osteomyelitis. Electronically Signed   By: Marijo Conception M.D.   On: 10/29/2018 20:59      IMPRESSION: Presumptive stage IIb (T3a, N0, M0 non-small cell lung cancer presenting the left upper lung area.  The patient's left upper lung mass has progressed in size since his initial evaluation earlier this year.  Treatment for this issue has been delayed to take care of the patient's metastatic skin cancer and vascular issues as above.  Patient is now ready to proceed with management of his  presumptive localized lung cancer.  I discussed with the patient that his tumor has enlarged but still remains potentially curable with radiation or surgery.  Apparently the patient has been evaluated by interventional radiology and he would be high risk for CT-guided biopsy of this lesion is not recommended to obtain diagnosis.  Patient was initially evaluated by Dr. [name] and he was felt to potentially be a surgical candidate but did recommend pulmonary function studies for further evaluation.  Patient would like to undergo formal evaluation with thoracic surgery to see if he would be a potential surgical candidate.  I have placed orders for pulmonary function studies to assist in this evaluation.  If the patient is not a surgical candidate then he will need tissue diagnosis prior to radiation therapy and this will most likely be  obtained through bronchoscopy  PLAN: Thoracic surgery consultation next week with Dr. Roxan Hockey as part of the multidisciplinary thoracic oncology clinic.  If the patient is not a surgical candidate then he will need to be scheduled for bronchoscopy and biopsy followed by definitive radiation therapy.  Since he does not have any nodal spread (hilar or mediastinal) I am unsure whether he would be a potential candidate for radiosensitizing chemotherapy but will address with Dr. [name] if radiation turns out to be the patient's course of treatment.     ------------------------------------------------  Blair Promise, PhD, MD  This document serves as a record of services personally performed by [name], MD. It was created on his behalf by Rae Lips, a trained medical scribe. The creation of this record is based on the scribe's personal observations and the provider's statements to them. This document has been checked and approved by the attending provider.

## 2018-11-02 DIAGNOSIS — L97522 Non-pressure chronic ulcer of other part of left foot with fat layer exposed: Secondary | ICD-10-CM | POA: Diagnosis not present

## 2018-11-02 DIAGNOSIS — E11621 Type 2 diabetes mellitus with foot ulcer: Secondary | ICD-10-CM | POA: Diagnosis not present

## 2018-11-05 ENCOUNTER — Telehealth: Payer: Self-pay | Admitting: *Deleted

## 2018-11-05 ENCOUNTER — Other Ambulatory Visit (HOSPITAL_COMMUNITY)
Admission: RE | Admit: 2018-11-05 | Discharge: 2018-11-05 | Disposition: A | Discharge status: Home / Self Care | Payer: PPO | Source: Ambulatory Visit | Attending: Radiation Oncology | Admitting: Radiation Oncology

## 2018-11-05 ENCOUNTER — Ambulatory Visit (INDEPENDENT_AMBULATORY_CARE_PROVIDER_SITE_OTHER): Payer: PPO | Admitting: Family

## 2018-11-05 ENCOUNTER — Encounter: Payer: Self-pay | Admitting: *Deleted

## 2018-11-05 ENCOUNTER — Other Ambulatory Visit: Payer: Self-pay

## 2018-11-05 ENCOUNTER — Ambulatory Visit (INDEPENDENT_AMBULATORY_CARE_PROVIDER_SITE_OTHER)
Admission: RE | Admit: 2018-11-05 | Discharge: 2018-11-05 | Disposition: A | Discharge status: Home / Self Care | Payer: PPO | Source: Ambulatory Visit | Attending: Family | Admitting: Family

## 2018-11-05 ENCOUNTER — Encounter: Payer: Self-pay | Admitting: Family

## 2018-11-05 ENCOUNTER — Other Ambulatory Visit: Payer: Self-pay | Admitting: Family

## 2018-11-05 ENCOUNTER — Ambulatory Visit (HOSPITAL_COMMUNITY)
Admission: RE | Admit: 2018-11-05 | Discharge: 2018-11-05 | Disposition: A | Discharge status: Home / Self Care | Payer: PPO | Source: Ambulatory Visit | Attending: Family | Admitting: Family

## 2018-11-05 VITALS — BP 151/74 | HR 82 | Temp 97.5°F | Resp 20 | Ht 74.0 in | Wt 165.0 lb

## 2018-11-05 DIAGNOSIS — I779 Disorder of arteries and arterioles, unspecified: Secondary | ICD-10-CM | POA: Diagnosis not present

## 2018-11-05 DIAGNOSIS — I6523 Occlusion and stenosis of bilateral carotid arteries: Secondary | ICD-10-CM | POA: Insufficient documentation

## 2018-11-05 DIAGNOSIS — E119 Type 2 diabetes mellitus without complications: Secondary | ICD-10-CM | POA: Diagnosis not present

## 2018-11-05 DIAGNOSIS — I2510 Atherosclerotic heart disease of native coronary artery without angina pectoris: Secondary | ICD-10-CM | POA: Diagnosis not present

## 2018-11-05 DIAGNOSIS — M869 Osteomyelitis, unspecified: Secondary | ICD-10-CM

## 2018-11-05 DIAGNOSIS — I10 Essential (primary) hypertension: Secondary | ICD-10-CM | POA: Diagnosis not present

## 2018-11-05 DIAGNOSIS — Z1159 Encounter for screening for other viral diseases: Secondary | ICD-10-CM | POA: Insufficient documentation

## 2018-11-05 DIAGNOSIS — L97523 Non-pressure chronic ulcer of other part of left foot with necrosis of muscle: Secondary | ICD-10-CM

## 2018-11-05 DIAGNOSIS — R918 Other nonspecific abnormal finding of lung field: Secondary | ICD-10-CM

## 2018-11-05 NOTE — Progress Notes (Signed)
[facility] [place]   CC: Follow up peripheral artery occlusive disease  History of Present Illness Mario [name] is a 78 y.o. male who is s/p left iliofemoral endarterectomy with vein patch angioplasty and left common femoral to below-knee popliteal artery bypass graft with a 6 mm external ring PTFE graft on 09/18/2018 by Dr. [name]. Also performed at that time was a left popliteal endarterectomy. He had extensive exophytic plaque from the distal external iliac artery into the common femoral and profundofemoral artery. The endarterectomy in the below-knee popliteal artery went down to the level of the anterior tibial and tibioperoneal trunk. His saphenous vein was not adequate for bypass. This was all done for nonhealing wound on his left toe.  Dr. [name] last evaluated pt on 10-22-18. At that time Dr. [name] was still concerned about his left second toe.  It appeared to be more edematous.  There was hyper granulation tissue at the tip which he cauterized with silver nitrate.  Dr. [name] indicated that pt is a very high risk for requiring a second toe amputation.  Pt was a little reluctant to proceed at that time. Dr. [name] referred him to the wound center for continued management and started him on antibiotics for 6 weeks in case there is some underlying osteomyelitis. He advised  follow-up with him again in 6 weeks, will repeat his duplex of his bypass at that time.  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   He had the Moh's procedure to right medial malleolus in February or March 2019; skin graft was placed over medial malleolus, this has healed. [facility] is his dermatologist, Dr. [name].  Pt has not been able to play pickel ball since his gym closed due to the [place] 19 pandemic. He is in preparation for  lung surgery to address lung cancer.   He denies tingling, numbness, cold sensation in either upper extremity.  He has a hx of gout in the left great toe.  He denies chest pain or dyspnea.  He denies any history of stroke. He had a CABG in 2016, quit smoking then.  Diabetic: Yes, A1C was7.1on 08-13-18, serum creatinine 1.42 on 09-18-18  Tobacco WJX:BJYNWG smoker, quitin 2016, smoked x 50years   Pt meds include: Statin :Yes Betablocker:Yes ASA:Yes Other anticoagulants/antiplatelets:states he is allergic to one blood thinner, plavix and pletal listed in his allergy list   Past Medical History:  Diagnosis Date   Arthritis    CAD (coronary artery disease)    a.  s/p IMI and BMS 1997;  b. Myoview 4/16:  inf scar with peri-infarct ischemia, EF 34%; high risk;  c. [facility] 5/16:  3 v CAD >> CABG (free L-LAD, S-OM, S-dRCA)   Carotid stenosis    a. carotid US 6/16:  bilat ICA 1-39%   Chronic kidney disease, stage II (mild)    Chronic systolic CHF (congestive heart failure) (HCC)    Essential hypertension, benign    Gout    HLD (hyperlipidemia)    Inguinal adenopathy 09/19/2018   Ischemic cardiomyopathy    a. EF by myoview 4/16 34%; b. LHC 5/16: EF 40-45%;  c. intraop TEE 6/16: EF 45-50%   Orthostasis    PAD (peripheral artery disease) (HCC)    Dr. Fletcher Anon   Pneumonia    hx of   Type II or unspecified  type diabetes mellitus without mention of complication, uncontrolled     Social History Social History   Tobacco Use   Smoking status: Former Smoker    Packs/day: 0.50    Years: 50.00    Pack years: 25.00    Types: Cigarettes    Quit date: 10/22/2014    Years since quitting: 4.0   Smokeless tobacco: Never Used  Substance Use Topics   Alcohol use: No    Alcohol/week: 0.0 standard drinks   Drug use: No    Family History Family History  Problem Relation Age of Onset   Colon cancer Father    Heart disease Mother    Non-Hodgkin's lymphoma  Daughter     Past Surgical History:  Procedure Laterality Date   ABDOMINAL AORTAGRAM N/A 10/09/2013   Procedure: ABDOMINAL Maxcine Ham;  Surgeon: Wellington Hampshire, MD;  Location: [place] [facility]ATH [facility];  Service: Cardiovascular;  Laterality: N/A;   ABDOMINAL AORTOGRAM N/A 12/28/2016   Procedure: ABDOMINAL AORTOGRAM;  Surgeon: Waynetta Sandy, MD;  Location: [place] CV [facility];  Service: Cardiovascular;  Laterality: N/A;   ABDOMINAL AORTOGRAM W/LOWER EXTREMITY Bilateral 09/11/2018   Procedure: ABDOMINAL AORTOGRAM W/LOWER EXTREMITY;  Surgeon: Serafina Mitchell, MD;  Location: [place] [facility];  Service: Cardiovascular;  Laterality: Bilateral;   CARDIAC CATHETERIZATION N/A 10/02/2014   Procedure: Left Heart Cath and Coronary Angiography;  Surgeon: Belva Crome, MD;  Location: [place] [facility];  Service: Cardiovascular;  Laterality: N/A;   CATARACT EXTRACTION     CHOLECYSTECTOMY     COLONOSCOPY     CORONARY ARTERY BYPASS GRAFT N/A 11/03/2014   Procedure: CORONARY ARTERY BYPASS GRAFTING  times three using left internal mammary and right greater saphenous vein;  Surgeon: Gaye Pollack, MD;  Location: [place] [facility];  Service: Open Heart Surgery;  Laterality: N/A;   FEMORAL-POPLITEAL BYPASS GRAFT Left 09/18/2018   Procedure: LEFT ILEO FEMORAL ENDARTERECTOMY WITH VEIN PATCH AND ANGIOPLASTY, POPLITEAL  ENDARTERECTOMY, FEM POP BYPASS;  Surgeon: Serafina Mitchell, MD;  Location: [place];  Service: Vascular;  Laterality: Left;   HERNIA REPAIR     LOWER EXTREMITY ANGIOGRAPHY Bilateral 12/28/2016   Procedure: Lower Extremity Angiography;  Surgeon: Waynetta Sandy, MD;  Location: [place] [facility];  Service: Cardiovascular;  Laterality: Bilateral;   LYMPH NODE DISSECTION Right 09/19/2018   Procedure: SUPERFICIAL RIGHT LYMPH NODE DISSECTION;  Surgeon: Fanny Skates, MD;  Location: [place];  Service: General;  Laterality: Right;   MELANOMA EXCISION     PERIPHERAL VASCULAR BALLOON ANGIOPLASTY Right  12/28/2016   Procedure: PERIPHERAL VASCULAR BALLOON ANGIOPLASTY;  Surgeon: Waynetta Sandy, MD;  Location: [place] [facility];  Service: Cardiovascular;  Laterality: Right;  SFA UNSUCCESSFUL PTA  UNABLE TO CROSS LESION   SKIN LESION EXCISION     multiple   TEE WITHOUT CARDIOVERSION N/A 11/03/2014   Procedure: TRANSESOPHAGEAL ECHOCARDIOGRAM (TEE);  Surgeon: Gaye Pollack, MD;  Location: [place];  Service: Open Heart Surgery;  Laterality: N/A;    Allergies  Allergen Reactions   Penicillins Anaphylaxis    Has patient had a PCN reaction causing immediate rash, facial/tongue/throat swelling, SOB or lightheadedness with hypotension: Yes Has patient had a PCN reaction causing severe rash involving mucus membranes or skin necrosis: No Has patient had a PCN reaction that required hospitalization: Yes Has patient had a PCN reaction occurring within the last 10 years: Unknown If all of the above answers are "NO", then may proceed with Cephalosporin use.    Plavix [Clopidogrel  Bisulfate] Hives   Pletal [Cilostazol] Itching    Current Outpatient Medications  Medication Sig Dispense Refill   acetaminophen (TYLENOL) 500 MG tablet Take 1,000 mg by mouth daily as needed for moderate pain.     allopurinol (ZYLOPRIM) 100 MG tablet Take 1 tablet by mouth daily 90 tablet 0   ARTIFICIAL TEAR OP Apply 1 drop to eye daily as needed (dry eyes).     aspirin EC 81 MG tablet Take 81 mg by mouth daily.     atorvastatin (LIPITOR) 40 MG tablet take 1 tablet by mouth daily at 6:00pm  (Patient taking differently: Take 40 mg by mouth daily. ) 90 tablet 0   Blood Glucose Monitoring Suppl (FREESTYLE LITE) DEVI USE TO TEST BLOOD SUGAR TWICE DAILY 1 each 1   calcium carbonate (TUMS - DOSED IN MG ELEMENTAL CALCIUM) 500 MG chewable tablet Chew 1 tablet by mouth daily as needed for indigestion or heartburn.     carvedilol (COREG) 3.125 MG tablet TAKE 1 TABLET BY MOUTH TWICE A DAY (Patient taking differently:  Take 3.125 mg by mouth 2 (two) times daily with a meal. ) 180 tablet 3   Cholecalciferol (VITAMIN D) 2000 units CAPS Take 2,000 Units by mouth daily.     clindamycin (CLEOCIN) 150 MG capsule Take 1 capsule (150 mg total) by mouth 3 (three) times daily. 40 capsule 0   Coenzyme Q10 (COQ10) 200 MG CAPS Take 200 mg by mouth daily.     fenofibrate micronized (ANTARA) 43 MG capsule Take 43 mg by mouth every evening.     ferrous sulfate 325 (65 FE) MG tablet Take 325 mg by mouth daily.     finasteride (PROSCAR) 5 MG tablet Take 5 mg by mouth every evening.   3   folic acid (FOLVITE) 737 MCG tablet Take 400 mcg by mouth daily.     glipiZIDE (GLUCOTROL XL) 10 MG 24 hr tablet TAKE 1 TABLET BY MOUTH AT BEDTIME FOR DIABETES. (Patient taking differently: Take 10 mg by mouth at bedtime. ) 30 tablet 2   glucose blood (FREESTYLE LITE) test strip Use as instructed to check blood sugar 2 times per day dx code E11.65 100 each 3   Homeopathic Products (LEG CRAMP RELIEF) TABS Take 1 tablet by mouth daily as needed (leg cramps).     hydrocortisone cream 1 % Apply 1 application topically daily as needed (irritation).     Lancets (FREESTYLE) lancets Use as instructed to check blood sugar 2 times per day dx code E11.65 100 each 3   lisinopril (PRINIVIL,ZESTRIL) 5 MG tablet Take 1 tablet (5 mg total) by mouth daily. 90 tablet 3   metFORMIN (GLUCOPHAGE) 1000 MG tablet TAKE 1 TABLET BY MOUTH TWICE DAILY FOR BREAKFAST AND DINNER FOR DIABETES. (Patient taking differently: Take 1,000 mg by mouth 2 (two) times daily with a meal. ) 60 tablet 2   miglitol (GLYSET) 50 MG tablet TAKE 1 TABLET BY MOUTH TWICE DAILY AT BREAKFAST AND DINNER FOR DIABETES. (Patient taking differently: Take 50 mg by mouth 2 (two) times daily. ) 60 tablet 2   multivitamin-lutein (OCUVITE-LUTEIN) CAPS capsule Take 1 capsule by mouth 2 (two) times daily.     oxyCODONE (ROXICODONE) 5 MG immediate release tablet Take 1 tablet (5 mg total) by  mouth every 6 (six) hours as needed. 30 tablet 0   tamsulosin (FLOMAX) 0.4 MG CAPS capsule Take 0.4 mg by mouth 2 (two) times daily.     VICTOZA 18 MG/3ML SOPN INJECT 1.8MG   INTO THE SKIN DAILY AT DINNERTIME FOR DIABETES. (Patient taking differently: Inject 1.8 mg into the skin daily with supper. ) 9 mL 2   vitamin B-12 (CYANOCOBALAMIN) 1000 MCG tablet Take 1,000 mcg by mouth daily.     No current facility-administered medications for this visit.     ROS: See HPI for pertinent positives and negatives.   Physical Examination  Vitals:   11/05/18 1153  BP: (!) 151/74  Pulse: 82  Resp: 20  Temp: (!) 97.5 F (36.4 C)  TempSrc: Temporal  SpO2: 100%  Weight: 165 lb (74.8 kg)  Height: 6\' 2"  (1.88 m)   Body mass index is 21.18 kg/m.  General: A&O x 3, WDWN, slender elderly male. Gait: normal, slight favoring of right forefoot HENT: No gross abnormalities.  Eyes: Pupils are equal Pulmonary: Respirations are non labored, CTAB, fair air movement in all fields Cardiac: regular rhythm, no detected murmur.         Carotid Bruits Right Left   Negative Negative   Radial pulses are 2+ palpable bilaterally   Adominal aortic pulse is not palpable                         VASCULAR EXAM: Extremities without ischemic changes, without Gangrene; with open wound at tip of left 2nd toe, see photo below .  Left toes                                                                                                            LE Pulses Right Left       FEMORAL  2+ palpable  2+ palpable        POPLITEAL  not palpable   not palpable       POSTERIOR TIBIAL  not palpable   not palpable        DORSALIS PEDIS      ANTERIOR TIBIAL not palpable  not palpable    Abdomen: soft, NT, no palpable masses. Skin: no rashes, no cellulitis, no ulcers noted. Musculoskeletal: no muscle wasting or atrophy. Mild kyphosis of cervical and thoracic spine.  Neurologic: A&O X 3; appropriate affect, Sensation  is normal; MOTOR FUNCTION:  moving all extremities equally, motor strength 5/5 throughout. Speech is fluent/normal. CN 2-12 intact, mild hearing loss, hearing aid in place. Psychiatric: Thought content is normal, mood appropriate for clinical situation.     ASSESSMENT: JAISE MOSER is a 78 y.o. male who is s/p left iliofemoral endarterectomy with vein patch angioplasty and left common femoral to below-knee popliteal artery bypass graft with a 6 mm external ring PTFE graft on 09/18/2018 by Dr. [name]. Also performed at that time was a left popliteal endarterectomy. He had extensive exophytic plaque from the distal external iliac artery into the common femoral and profundofemoral artery. The endarterectomy in the below-knee popliteal artery went down to the level of the anterior tibial and tibioperoneal trunk. His saphenous vein was not adequate for bypass. This was all done for nonhealing wound on his left toe.  Left iliofemoral arterial duplex today with biphasic waveforms, no significant stenosis.  Carotid duplex today shows 1-39% stenosis in the right ICA, no stenosis in the left ICA.   Left foot radiograph on 10-29-18 shows progressive lytic destruction seen involving second distal phalanx consistent with osteomyelitis. 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   Dr. [name] spoke with and examined pt.  Pt is to see a lung surgeon, Dr. Roxan Hockey, for upcoming surgery to address lung cancer. Pt needs to have left 2nd toe amputation for progressive osteomyelitis of left 2nd toe. He will call us after his lung surgery date is set; pt will also ask Dr. Tanda Rockers to communicate with Dr. [name], his toe amputation could take place during the lung surgery.     DATA  Carotid Duplex (11-05-18): Right Carotid: Velocities in the  right ICA are consistent with a 1-39% stenosis.                Large calcified plaque at the carotid bifurcation with acoustic                shadowing, visualization limited. Left Carotid: There is no evidence of stenosis in the left ICA.               Non-hemodynamically significant plaque noted in the CCA. Large               calcified plaque at the carotid bifurcation with acoustic               shadowing, visualization limited. Vertebrals:  Bilateral vertebral arteries demonstrate antegrade flow. Subclavians: Normal flow hemodynamics were seen in bilateral subclavian              arteries.   Left Iliofemoral artery duplex (11-05-18): +----------+--------+-----+--------+--------+--------+  LEFT       PSV cm/s Ratio Stenosis Waveform Comments  +----------+--------+-----+--------+--------+--------+  POP Distal 54                                         +----------+--------+-----+--------+--------+--------+  ATA Distal 136                                        +----------+--------+-----+--------+--------+--------+  PTA Distal 98                                         +----------+--------+-----+--------+--------+--------+    Left Graft #1: +--------------------+--------+---------------+--------+--------+                       PSV cm/s Stenosis        Waveform Comments  +--------------------+--------+---------------+--------+--------+  Inflow               62                       biphasic           +--------------------+--------+---------------+--------+--------+  Proximal Anastomosis 130  biphasic           +--------------------+--------+---------------+--------+--------+  Proximal Graft       87                       biphasic           +--------------------+--------+---------------+--------+--------+  Mid Graft            92                       biphasic           +--------------------+--------+---------------+--------+--------+  Distal Graft         100                       biphasic           +--------------------+--------+---------------+--------+--------+  Distal Anastamosis   119                      biphasic           +--------------------+--------+---------------+--------+--------+  Outflow              157      30-49% stenosis biphasic           +--------------------+--------+---------------+--------+--------+ Summary: Left: Widely patent left common femoral to below-knee popliteal artery bypass graft. Mildly elevated velocity involving the native outflow artery (30-49%).  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   Left Foot Radiograph (10-29-18): Progressive lytic destruction seen involving second distal phalanx consistent with osteomyelitis.   PLAN:  Dr. [name] advised that pt call us after he sees Dr. Tanda Rockers to get an idea when his lung surgery will be; Dr. [name] could perform left second toe amputation during the lung surgery.   I discussed in depth with the patient the nature of atherosclerosis, and emphasized the importance of maximal medical management including strict control of blood pressure, blood glucose, and lipid levels, obtaining regular exercise, and continued cessation of smoking.  The patient is aware that without maximal medical management the underlying atherosclerotic disease process will progress, limiting the benefit of any interventions.  The patient was given information about PAD including signs, symptoms, treatment, what symptoms should prompt the patient to seek immediate medical care, and risk reduction measures to take.  Clemon Chambers, RN, MSN, FNP-C [facility] Phone: [phone]  Clinic MD: [name]  11/05/18 12:32 PM

## 2018-11-05 NOTE — Progress Notes (Signed)
Oncology Nurse Navigator Documentation  Oncology Nurse Navigator Flowsheets 11/05/2018  Navigator Location [facility]  Referral Date to RadOnc/MedOnc -  Navigator Encounter Type Telephone/I called to get PFT's scheduled. Patient needs COVID test.  I called pre-procedure testing site and was told to get PFT's scheduled first.  I did and then called them back.  I was given an appt for today for COVID test.  I called his wife but she did not want to take the message.  I called the patient and left vm message.  I then called [facility] where he is being seen to give him the message to get COVID test today and where to go.    Telephone Outgoing Call  Treatment Phase Abnormal Scans  Barriers/Navigation Needs Coordination of Care;Education  Education Other  Interventions Coordination of Care;Education  Coordination of Care Appts;Other  Education Method Verbal  Acuity Level 4  Time Spent with Patient > 120

## 2018-11-05 NOTE — Telephone Encounter (Signed)
Oncology Nurse Navigator Documentation  Oncology Nurse Navigator Flowsheets 11/05/2018  Navigator Location [facility]  Referral Date to RadOnc/MedOnc -  Navigator Encounter Type Telephone/I received call from [place] that Mario [name] was confused about appts.  I called him and clarified and gave him the pre-procedure instructions for PFT's.  He verbalized understanding of all appts on Thursday.  He did get his COVID 19 test today.    Telephone Outgoing Call  Treatment Phase Abnormal Scans  Barriers/Navigation Needs Education  Education Other  Interventions Education  Coordination of Care -  Education Method Verbal  Acuity Level 1  Time Spent with Patient 30

## 2018-11-07 ENCOUNTER — Other Ambulatory Visit: Payer: Self-pay

## 2018-11-07 LAB — NOVEL CORONAVIRUS, NAA (HOSP ORDER, SEND-OUT TO REF LAB; TAT 18-24 HRS): SARS-CoV-2, NAA: NOT DETECTED

## 2018-11-08 ENCOUNTER — Other Ambulatory Visit: Payer: Self-pay

## 2018-11-08 ENCOUNTER — Other Ambulatory Visit: Payer: Self-pay | Admitting: *Deleted

## 2018-11-08 ENCOUNTER — Encounter: Payer: Self-pay | Admitting: Thoracic Surgery (Cardiothoracic Vascular Surgery)

## 2018-11-08 ENCOUNTER — Ambulatory Visit (HOSPITAL_COMMUNITY)
Admission: RE | Admit: 2018-11-08 | Discharge: 2018-11-08 | Disposition: A | Discharge status: Home / Self Care | Payer: PPO | Source: Ambulatory Visit | Attending: Radiation Oncology | Admitting: Radiation Oncology

## 2018-11-08 ENCOUNTER — Institutional Professional Consult (permissible substitution) (INDEPENDENT_AMBULATORY_CARE_PROVIDER_SITE_OTHER): Payer: PPO | Admitting: Thoracic Surgery (Cardiothoracic Vascular Surgery)

## 2018-11-08 VITALS — BP 150/74 | HR 83 | Temp 97.5°F | Resp 20 | Wt 166.4 lb

## 2018-11-08 DIAGNOSIS — R911 Solitary pulmonary nodule: Secondary | ICD-10-CM | POA: Insufficient documentation

## 2018-11-08 DIAGNOSIS — R918 Other nonspecific abnormal finding of lung field: Secondary | ICD-10-CM

## 2018-11-08 LAB — PULMONARY FUNCTION TEST
DL/VA % pred: 39 %
DL/VA: 1.52 ml/min/mmHg/L
DLCO unc % pred: 37 %
DLCO unc: 10.11 ml/min/mmHg
FEF 25-75 Post: 1.6 L/sec
FEF 25-75 Pre: 1.06 L/sec
FEF2575-%Change-Post: 50 %
FEF2575-%Pred-Post: 66 %
FEF2575-%Pred-Pre: 44 %
FEV1-%Change-Post: 10 %
FEV1-%Pred-Post: 94 %
FEV1-%Pred-Pre: 85 %
FEV1-Post: 3.19 L
FEV1-Pre: 2.89 L
FEV1FVC-%Change-Post: 10 %
FEV1FVC-%Pred-Pre: 74 %
FEV6-%Change-Post: 7 %
FEV6-%Pred-Post: 116 %
FEV6-%Pred-Pre: 108 %
FEV6-Post: 5.13 L
FEV6-Pre: 4.76 L
FEV6FVC-%Change-Post: 7 %
FEV6FVC-%Pred-Post: 101 %
FEV6FVC-%Pred-Pre: 94 %
FVC-%Change-Post: 0 %
FVC-%Pred-Post: 115 %
FVC-%Pred-Pre: 114 %
FVC-Post: 5.37 L
FVC-Pre: 5.37 L
Post FEV1/FVC ratio: 59 %
Post FEV6/FVC ratio: 96 %
Pre FEV1/FVC ratio: 54 %
Pre FEV6/FVC Ratio: 89 %
RV % pred: 133 %
RV: 3.68 L
TLC % pred: 110 %
TLC: 8.44 L

## 2018-11-08 MED ORDER — ALBUTEROL SULFATE (2.5 MG/3ML) 0.083% IN NEBU
2.5000 mg | INHALATION_SOLUTION | Freq: Once | RESPIRATORY_TRACT | Status: AC
  Administered 2018-11-08: 2.5 mg via RESPIRATORY_TRACT

## 2018-11-08 NOTE — H&P (View-Only) (Signed)
PCP is [name], MD Referring Provider is [name], MD  No chief complaint on file.   HPI: [name] is sent for consultation re: a left upper lobe lung mass  Jaramiah Bossard is a 78 yo man with a past history of tobacco abuse, COPD, CAD, s/p CABG by [facility] in 2016, PAD, chronic systolic heart failure, stage II CKD type II diabetes, and arthritis.  He smoked about a ppd for 50 years before quitting in 2016. 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 Those have been surgically excised. He had a PET in February which showed the groin node as well as a spiculated mass in the left upper lobe. Repeat PET recently showed increased size of the left upper lobe mass. No evidence of regional or distant metastasis.  Relatively active playing pickleball. No anginal symptoms. SOB with heavy exertion. Appetite Ok, no weight loss. Zubrod Score: At the time of surgery this patient's most appropriate activity status/level should be described as: []     0    Normal activity, no symptoms [x]     1    Restricted in physical strenuous activity but ambulatory, able to do out light work []     2    Ambulatory and capable of self care, unable to do work activities, up and about >50 % of waking hours                              []     3    Only limited self care, in bed greater than 50% of waking hours []     4    Completely disabled, no self care, confined to bed or chair []     5    Moribund  Past Medical History:  Diagnosis Date  . Arthritis   . CAD (coronary artery disease)    a.  s/p IMI and BMS 1997;  b. Myoview 4/16:  inf scar with peri-infarct ischemia, EF 34%; high risk;  c. LHC 5/16:  3 v CAD >> CABG (free L-LAD, S-OM, S-dRCA)  . Carotid stenosis    a. carotid US 6/16:  bilat ICA 1-39%  . Chronic kidney disease, stage II (mild)   . Chronic systolic CHF (congestive heart failure) ([place])   . Essential hypertension, benign   . Gout   . HLD  (hyperlipidemia)   . Inguinal adenopathy 09/19/2018  . Ischemic cardiomyopathy    a. EF by myoview 4/16 34%; b. LHC 5/16: EF 40-45%;  c. intraop TEE 6/16: EF 45-50%  . Orthostasis   . PAD (peripheral artery disease) ([facility])    Dr. Fletcher Anon  . Pneumonia    hx of  . Type II or unspecified type diabetes mellitus without mention of complication, uncontrolled     Past Surgical History:  Procedure Laterality Date  . ABDOMINAL AORTAGRAM N/A 10/09/2013   Procedure: ABDOMINAL Maxcine Ham;  Surgeon: Wellington Hampshire, MD;  Location: [facility];  Service: Cardiovascular;  Laterality: N/A;  . ABDOMINAL AORTOGRAM N/A 12/28/2016   Procedure: ABDOMINAL AORTOGRAM;  Surgeon: Waynetta Sandy, MD;  Location: [facility];  Service: Cardiovascular;  Laterality: N/A;  . ABDOMINAL AORTOGRAM W/LOWER EXTREMITY Bilateral 09/11/2018   Procedure: ABDOMINAL AORTOGRAM W/LOWER EXTREMITY;  Surgeon: Serafina Mitchell, MD;  Location: [facility];  Service: Cardiovascular;  Laterality: Bilateral;  . CARDIAC CATHETERIZATION N/A 10/02/2014   Procedure: Left  Heart Cath and Coronary Angiography;  Surgeon: Belva Crome, MD;  Location: [facility];  Service: Cardiovascular;  Laterality: N/A;  . CATARACT EXTRACTION    . CHOLECYSTECTOMY    . COLONOSCOPY    . CORONARY ARTERY BYPASS GRAFT N/A 11/03/2014   Procedure: CORONARY ARTERY BYPASS GRAFTING  times three using left internal mammary and right greater saphenous vein;  Surgeon: Gaye Pollack, MD;  Location: [facility];  Service: Open Heart Surgery;  Laterality: N/A;  . FEMORAL-POPLITEAL BYPASS GRAFT Left 09/18/2018   Procedure: LEFT ILEO FEMORAL ENDARTERECTOMY WITH VEIN PATCH AND ANGIOPLASTY, POPLITEAL  ENDARTERECTOMY, FEM POP BYPASS;  Surgeon: Serafina Mitchell, MD;  Location: [facility];  Service: Vascular;  Laterality: Left;  . HERNIA REPAIR    . LOWER EXTREMITY ANGIOGRAPHY Bilateral 12/28/2016   Procedure: Lower Extremity Angiography;  Surgeon: Waynetta Sandy, MD;  Location: [facility];  Service: Cardiovascular;  Laterality: Bilateral;  . LYMPH NODE DISSECTION Right 09/19/2018   Procedure: SUPERFICIAL RIGHT LYMPH NODE DISSECTION;  Surgeon: Fanny Skates, MD;  Location: [facility];  Service: General;  Laterality: Right;  . MELANOMA EXCISION    . PERIPHERAL VASCULAR BALLOON ANGIOPLASTY Right 12/28/2016   Procedure: PERIPHERAL VASCULAR BALLOON ANGIOPLASTY;  Surgeon: Waynetta Sandy, MD;  Location: [facility];  Service: Cardiovascular;  Laterality: Right;  SFA UNSUCCESSFUL PTA  UNABLE TO CROSS LESION  . SKIN LESION EXCISION     multiple  . TEE WITHOUT CARDIOVERSION N/A 11/03/2014   Procedure: TRANSESOPHAGEAL ECHOCARDIOGRAM (TEE);  Surgeon: Gaye Pollack, MD;  Location: [facility];  Service: Open Heart Surgery;  Laterality: N/A;    Family History  Problem Relation Age of Onset  . Colon cancer Father   . Heart disease Mother   . Non-Hodgkin's lymphoma Daughter     Social History Social History   Tobacco Use  . Smoking status: Former Smoker    Packs/day: 0.50    Years: 50.00    Pack years: 25.00    Types: Cigarettes    Quit date: 10/22/2014    Years since quitting: 4.0  . Smokeless tobacco: Never Used  Substance Use Topics  . Alcohol use: No    Alcohol/week: 0.0 standard drinks  . Drug use: No    Current Outpatient Medications  Medication Sig Dispense Refill  . acetaminophen (TYLENOL) 500 MG tablet Take 1,000 mg by mouth daily as needed for moderate pain.    Marland Kitchen allopurinol (ZYLOPRIM) 100 MG tablet Take 1 tablet by mouth daily 90 tablet 0  . ARTIFICIAL TEAR OP Apply 1 drop to eye daily as needed (dry eyes).    Marland Kitchen aspirin EC 81 MG tablet Take 81 mg by mouth daily.    Marland Kitchen atorvastatin (LIPITOR) 40 MG tablet take 1 tablet by mouth daily at 6:00pm  (Patient taking differently: Take 40 mg by mouth daily. ) 90 tablet 0  . Blood Glucose Monitoring Suppl (FREESTYLE LITE) DEVI USE TO TEST BLOOD SUGAR TWICE DAILY 1 each 1   . calcium carbonate (TUMS - DOSED IN MG ELEMENTAL CALCIUM) 500 MG chewable tablet Chew 1 tablet by mouth daily as needed for indigestion or heartburn.    . carvedilol (COREG) 3.125 MG tablet TAKE 1 TABLET BY MOUTH TWICE A DAY (Patient taking differently: Take 3.125 mg by mouth 2 (two) times daily with a meal. ) 180 tablet 3  . Cholecalciferol (VITAMIN D) 2000 units CAPS Take 2,000 Units by mouth daily.    . clindamycin (CLEOCIN)  150 MG capsule Take 1 capsule (150 mg total) by mouth 3 (three) times daily. 40 capsule 0  . Coenzyme Q10 (COQ10) 200 MG CAPS Take 200 mg by mouth daily.    . fenofibrate micronized (ANTARA) 43 MG capsule Take 43 mg by mouth every evening.    . ferrous sulfate 325 (65 FE) MG tablet Take 325 mg by mouth daily.    . finasteride (PROSCAR) 5 MG tablet Take 5 mg by mouth every evening.   3  . folic acid (FOLVITE) 099 MCG tablet Take 400 mcg by mouth daily.    Marland Kitchen glipiZIDE (GLUCOTROL XL) 10 MG 24 hr tablet TAKE 1 TABLET BY MOUTH AT BEDTIME FOR DIABETES. (Patient taking differently: Take 10 mg by mouth at bedtime. ) 30 tablet 2  . glucose blood (FREESTYLE LITE) test strip Use as instructed to check blood sugar 2 times per day dx code E11.65 100 each 3  . Homeopathic Products (LEG CRAMP RELIEF) TABS Take 1 tablet by mouth daily as needed (leg cramps).    . hydrocortisone cream 1 % Apply 1 application topically daily as needed (irritation).    . Lancets (FREESTYLE) lancets Use as instructed to check blood sugar 2 times per day dx code E11.65 100 each 3  . lisinopril (PRINIVIL,ZESTRIL) 5 MG tablet Take 1 tablet (5 mg total) by mouth daily. 90 tablet 3  . metFORMIN (GLUCOPHAGE) 1000 MG tablet TAKE 1 TABLET BY MOUTH TWICE DAILY FOR BREAKFAST AND DINNER FOR DIABETES. (Patient taking differently: Take 1,000 mg by mouth 2 (two) times daily with a meal. ) 60 tablet 2  . miglitol (GLYSET) 50 MG tablet TAKE 1 TABLET BY MOUTH TWICE DAILY AT BREAKFAST AND DINNER FOR DIABETES. (Patient taking  differently: Take 50 mg by mouth 2 (two) times daily. ) 60 tablet 2  . multivitamin-lutein (OCUVITE-LUTEIN) CAPS capsule Take 1 capsule by mouth 2 (two) times daily.    Marland Kitchen oxyCODONE (ROXICODONE) 5 MG immediate release tablet Take 1 tablet (5 mg total) by mouth every 6 (six) hours as needed. 30 tablet 0  . tamsulosin (FLOMAX) 0.4 MG CAPS capsule Take 0.4 mg by mouth 2 (two) times daily.    Marland Kitchen VICTOZA 18 MG/3ML SOPN INJECT 1.8MG  INTO THE SKIN DAILY AT DINNERTIME FOR DIABETES. (Patient taking differently: Inject 1.8 mg into the skin daily with supper. ) 9 mL 2  . vitamin B-12 (CYANOCOBALAMIN) 1000 MCG tablet Take 1,000 mcg by mouth daily.     No current facility-administered medications for this visit.     Allergies  Allergen Reactions  . Penicillins Anaphylaxis    Has patient had a PCN reaction causing immediate rash, facial/tongue/throat swelling, SOB or lightheadedness with hypotension: Yes Has patient had a PCN reaction causing severe rash involving mucus membranes or skin necrosis: No Has patient had a PCN reaction that required hospitalization: Yes Has patient had a PCN reaction occurring within the last 10 years: Unknown If all of the above answers are "NO", then may proceed with Cephalosporin use.   Marland Kitchen Plavix [Clopidogrel Bisulfate] Hives  . Pletal [Cilostazol] Itching    Review of Systems  Constitutional: Negative for activity change, appetite change and unexpected weight change.  HENT: Negative for trouble swallowing and voice change.   Respiratory: Positive for cough.   Cardiovascular: Negative for chest pain and leg swelling.  Gastrointestinal: Negative for abdominal pain.  Musculoskeletal: Positive for arthralgias.  Skin:       Ulcer left toe    BP (!) 150/74  Pulse 83   Temp (!) 97.5 F (36.4 C)   Resp 20   Wt 166 lb 6.4 oz (75.5 kg)   SpO2 100%   BMI 21.36 kg/m  Physical Exam Vitals signs reviewed.  Constitutional:      General: He is not in acute  distress. HENT:     Head: Normocephalic and atraumatic.  Eyes:     General: No scleral icterus.    Extraocular Movements: Extraocular movements intact.     Conjunctiva/sclera: Conjunctivae normal.  Neck:     Musculoskeletal: Neck supple. No neck rigidity.  Cardiovascular:     Rate and Rhythm: Normal rate and regular rhythm.     Heart sounds: No murmur (faint systolic).  Pulmonary:     Effort: Pulmonary effort is normal. No respiratory distress.     Breath sounds: No wheezing or rales.     Comments: Diminished BS bilaterally Abdominal:     General: There is no distension.     Palpations: Abdomen is soft.     Tenderness: There is no abdominal tenderness.  Musculoskeletal:     Right lower leg: No edema.     Left lower leg: Edema present.  Lymphadenopathy:     Cervical: No cervical adenopathy.  Skin:    General: Skin is warm and dry.  Neurological:     General: No focal deficit present.     Mental Status: He is alert and oriented to person, place, and time.     Cranial Nerves: No cranial nerve deficit.     Motor: No weakness.     Coordination: Coordination normal.    Diagnostic Tests: NUCLEAR MEDICINE PET SKULL BASE TO THIGH  TECHNIQUE: 8.9 mCi F-18 FDG was injected intravenously. Full-ring PET imaging was performed from the skull base to thigh after the radiotracer. CT data was obtained and used for attenuation correction and anatomic localization.  Fasting blood glucose: 96 mg/dl  COMPARISON:  [id number] PET-CT. 06/29/2018 CT chest, abdomen and pelvis.  FINDINGS: Mediastinal blood pool activity: SUV max 1.7  Liver activity: SUV max NA  NECK: No hypermetabolic lymph nodes in the neck.  Incidental CT findings: none  CHEST: Hypermetabolic predominantly solid 5.4 x 5.4 cm anterior left upper lobe lung mass (series 8/image 36) with max SUV 11.3, increased from 4.1 x 3.7 cm with max SUV 9.2. No additional hypermetabolic pulmonary findings. No hypermetabolic  hilar, mediastinal or axillary lymph nodes.  Incidental CT findings: Three-vessel coronary atherosclerosis status post CABG. Atherosclerotic thoracic aorta with stable ectatic 4.2 cm ascending thoracic aorta. Severe centrilobular emphysema. Subpleural 5 mm anterior left lower lobe solid pulmonary nodule (series 8/image 31), stable using similar measurement technique, probably benign. No new significant pulmonary nodules. Intact sternotomy wires.  ABDOMEN/PELVIS: No abnormal hypermetabolic activity within the liver, pancreas, adrenal glands, or spleen. No hypermetabolic lymph nodes in the abdomen or pelvis. Diffuse bowel hypermetabolism without CT correlate, compatible with physiologic bowel activity. Low level uptake associated with low-attenuation fluid collections in the left inguinal region with surrounding fat stranding, largest measuring 3.3 x 3.1 cm (series 4/image 192), with new partially visualized left femoral-popliteal bypass graft. Surgical clips in the right inguinal region with associated 2.4 x 1.7 cm postoperative seroma (series 4/image 215). Atherosclerotic nonaneurysmal abdominal aorta. Diffuse colonic diverticulosis.  Incidental CT findings: Cholecystectomy.  SKELETON: No focal hypermetabolic activity to suggest skeletal metastasis.  Incidental CT findings: none  IMPRESSION: 1. Anterior left upper lobe 5.4 cm hypermetabolic lung mass (max SUV 11.3), increased in size and metabolism since  07/10/2018 PET-CT. Primary bronchogenic carcinoma remains the diagnosis of exclusion. 2. No hypermetabolic thoracic adenopathy or distant metastatic disease. 3. Low level uptake associated with low-attenuation fluid collections in the left greater than right inguinal regions, most compatible with postoperative seromas associated with right inguinal lymphadenectomy and left femoral-popliteal bypass graft surgery. 4. Aortic Atherosclerosis (ICD10-I70.0) and Emphysema  (ICD10-J43.9).   Electronically Signed   By: Ilona Sorrel M.D.   On: 10/29/2018 15:01 I personally reviewed the PET images and concur with the findings noted above. 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 50 py history of smoking prior to quitting in 2016.   Left upper lobe lung mass- 5.4 cm spiculated mass. Hypermetabolic in malignant range on PET. Most likely a primary lung cancer. Appearance on imaging would be highly unusual for metastatic disease. Granulomatous disease is also in the differential.  I reviewed the potential approaches to diagnosis and treatment with Mr. Botelho. I think the best option for diagnosis is navigational bronchoscopy. Not favorable for CT guided biopsy due to emphysema.   Discussed advantages and disadvantages of surgery v radiation for treatment of the left upper lobe mass. He is a marginal surgical candidate due to impaired diffusion capacity. DLCO of < 30% predicted postop is associated with increased risk of poor outcome. His flows are surprisingly well preserved given the severe bullous emphysema seen on CXFR. Unfortunately the left upper lobe seems to be the best preserved portion of the lung on CT. I think there is a high probability he would be oxygen dependent after a left upper lobectomy. He does not find that prospect acceptable and is leaning to XRT.  I discussed the procedure of navigational bronchoscopy with Mr. Broadus. He understands it is an endoscopic procedure and will done under general anesthesia. I informed him of the indications, risks, benefits and alternatives. He understands the risks include, but are not limited  to death, stroke, MI, DVT/PE, bleeding, pneumothorax and failure to make a diagnosis.  He agrees to proceed with ENB.  Plan: Navigational bronchoscopy for biopsy of left upper lobe lung mass  [name], MD [facility] [phone]

## 2018-11-08 NOTE — Progress Notes (Signed)
The proposed treatment discussed in cancer conference 11/08/2018 is for discussion purpose only and is not a binding recommendation.  The patient was not physically examined nor present for their treatment options.  Therefore, final treatment plans cannot be decided.

## 2018-11-08 NOTE — Progress Notes (Signed)
PCP is [name], MD Referring Provider is [name], MD  No chief complaint on file.   HPI: Mario [name] is sent for consultation re: a left upper lobe lung mass  Mario [name] is a 78 yo man with a past history of tobacco abuse, COPD, CAD, s/p CABG by [facility] in 2016, PAD, chronic systolic heart failure, stage II CKD type II diabetes, and arthritis.  He smoked about a ppd for 50 years before quitting in 2016. 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 Those have been surgically excised. He had a PET in February which showed the groin node as well as a spiculated mass in the left upper lobe. Repeat PET recently showed increased size of the left upper lobe mass. No evidence of regional or distant metastasis.  Relatively active playing pickleball. No anginal symptoms. SOB with heavy exertion. Appetite Ok, no weight loss. Zubrod Score: At the time of surgery this patient's most appropriate activity status/level should be described as: []     0    Normal activity, no symptoms [x]     1    Restricted in physical strenuous activity but ambulatory, able to do out light work []     2    Ambulatory and capable of self care, unable to do work activities, up and about >50 % of waking hours                              []     3    Only limited self care, in bed greater than 50% of waking hours []     4    Completely disabled, no self care, confined to bed or chair []     5    Moribund  Past Medical History:  Diagnosis Date  . Arthritis   . CAD (coronary artery disease)    a.  s/p IMI and BMS 1997;  b. Myoview 4/16:  inf scar with peri-infarct ischemia, EF 34%; high risk;  c. LHC 5/16:  3 v CAD >> CABG (free L-LAD, S-OM, S-dRCA)  . Carotid stenosis    a. carotid US 6/16:  bilat ICA 1-39%  . Chronic kidney disease, stage II (mild)   . Chronic systolic CHF (congestive heart failure) ([place])   . Essential hypertension, benign   . Gout   . HLD  (hyperlipidemia)   . Inguinal adenopathy 09/19/2018  . Ischemic cardiomyopathy    a. EF by myoview 4/16 34%; b. LHC 5/16: EF 40-45%;  c. intraop TEE 6/16: EF 45-50%  . Orthostasis   . PAD (peripheral artery disease) ([facility])    Dr. Fletcher Anon  . Pneumonia    hx of  . Type II or unspecified type diabetes mellitus without mention of complication, uncontrolled     Past Surgical History:  Procedure Laterality Date  . ABDOMINAL AORTAGRAM N/A 10/09/2013   Procedure: ABDOMINAL Maxcine Ham;  Surgeon: Wellington Hampshire, MD;  Location: [facility];  Service: Cardiovascular;  Laterality: N/A;  . ABDOMINAL AORTOGRAM N/A 12/28/2016   Procedure: ABDOMINAL AORTOGRAM;  Surgeon: Waynetta Sandy, MD;  Location: [facility];  Service: Cardiovascular;  Laterality: N/A;  . ABDOMINAL AORTOGRAM W/LOWER EXTREMITY Bilateral 09/11/2018   Procedure: ABDOMINAL AORTOGRAM W/LOWER EXTREMITY;  Surgeon: Serafina Mitchell, MD;  Location: [facility];  Service: Cardiovascular;  Laterality: Bilateral;  . CARDIAC CATHETERIZATION N/A 10/02/2014   Procedure: Left  Heart Cath and Coronary Angiography;  Surgeon: Belva Crome, MD;  Location: [facility];  Service: Cardiovascular;  Laterality: N/A;  . CATARACT EXTRACTION    . CHOLECYSTECTOMY    . COLONOSCOPY    . CORONARY ARTERY BYPASS GRAFT N/A 11/03/2014   Procedure: CORONARY ARTERY BYPASS GRAFTING  times three using left internal mammary and right greater saphenous vein;  Surgeon: Gaye Pollack, MD;  Location: [facility];  Service: Open Heart Surgery;  Laterality: N/A;  . FEMORAL-POPLITEAL BYPASS GRAFT Left 09/18/2018   Procedure: LEFT ILEO FEMORAL ENDARTERECTOMY WITH VEIN PATCH AND ANGIOPLASTY, POPLITEAL  ENDARTERECTOMY, FEM POP BYPASS;  Surgeon: Serafina Mitchell, MD;  Location: [facility];  Service: Vascular;  Laterality: Left;  . HERNIA REPAIR    . LOWER EXTREMITY ANGIOGRAPHY Bilateral 12/28/2016   Procedure: Lower Extremity Angiography;  Surgeon: Waynetta Sandy, MD;  Location: [facility];  Service: Cardiovascular;  Laterality: Bilateral;  . LYMPH NODE DISSECTION Right 09/19/2018   Procedure: SUPERFICIAL RIGHT LYMPH NODE DISSECTION;  Surgeon: Fanny Skates, MD;  Location: [facility];  Service: General;  Laterality: Right;  . MELANOMA EXCISION    . PERIPHERAL VASCULAR BALLOON ANGIOPLASTY Right 12/28/2016   Procedure: PERIPHERAL VASCULAR BALLOON ANGIOPLASTY;  Surgeon: Waynetta Sandy, MD;  Location: [facility];  Service: Cardiovascular;  Laterality: Right;  SFA UNSUCCESSFUL PTA  UNABLE TO CROSS LESION  . SKIN LESION EXCISION     multiple  . TEE WITHOUT CARDIOVERSION N/A 11/03/2014   Procedure: TRANSESOPHAGEAL ECHOCARDIOGRAM (TEE);  Surgeon: Gaye Pollack, MD;  Location: [facility];  Service: Open Heart Surgery;  Laterality: N/A;    Family History  Problem Relation Age of Onset  . Colon cancer Father   . Heart disease Mother   . Non-Hodgkin's lymphoma Daughter     Social History Social History   Tobacco Use  . Smoking status: Former Smoker    Packs/day: 0.50    Years: 50.00    Pack years: 25.00    Types: Cigarettes    Quit date: 10/22/2014    Years since quitting: 4.0  . Smokeless tobacco: Never Used  Substance Use Topics  . Alcohol use: No    Alcohol/week: 0.0 standard drinks  . Drug use: No    Current Outpatient Medications  Medication Sig Dispense Refill  . acetaminophen (TYLENOL) 500 MG tablet Take 1,000 mg by mouth daily as needed for moderate pain.    Marland Kitchen allopurinol (ZYLOPRIM) 100 MG tablet Take 1 tablet by mouth daily 90 tablet 0  . ARTIFICIAL TEAR OP Apply 1 drop to eye daily as needed (dry eyes).    Marland Kitchen aspirin EC 81 MG tablet Take 81 mg by mouth daily.    Marland Kitchen atorvastatin (LIPITOR) 40 MG tablet take 1 tablet by mouth daily at 6:00pm  (Patient taking differently: Take 40 mg by mouth daily. ) 90 tablet 0  . Blood Glucose Monitoring Suppl (FREESTYLE LITE) DEVI USE TO TEST BLOOD SUGAR TWICE DAILY 1 each 1   . calcium carbonate (TUMS - DOSED IN MG ELEMENTAL CALCIUM) 500 MG chewable tablet Chew 1 tablet by mouth daily as needed for indigestion or heartburn.    . carvedilol (COREG) 3.125 MG tablet TAKE 1 TABLET BY MOUTH TWICE A DAY (Patient taking differently: Take 3.125 mg by mouth 2 (two) times daily with a meal. ) 180 tablet 3  . Cholecalciferol (VITAMIN D) 2000 units CAPS Take 2,000 Units by mouth daily.    . clindamycin (CLEOCIN)  150 MG capsule Take 1 capsule (150 mg total) by mouth 3 (three) times daily. 40 capsule 0  . Coenzyme Q10 (COQ10) 200 MG CAPS Take 200 mg by mouth daily.    . fenofibrate micronized (ANTARA) 43 MG capsule Take 43 mg by mouth every evening.    . ferrous sulfate 325 (65 FE) MG tablet Take 325 mg by mouth daily.    . finasteride (PROSCAR) 5 MG tablet Take 5 mg by mouth every evening.   3  . folic acid (FOLVITE) 102 MCG tablet Take 400 mcg by mouth daily.    Marland Kitchen glipiZIDE (GLUCOTROL XL) 10 MG 24 hr tablet TAKE 1 TABLET BY MOUTH AT BEDTIME FOR DIABETES. (Patient taking differently: Take 10 mg by mouth at bedtime. ) 30 tablet 2  . glucose blood (FREESTYLE LITE) test strip Use as instructed to check blood sugar 2 times per day dx code E11.65 100 each 3  . Homeopathic Products (LEG CRAMP RELIEF) TABS Take 1 tablet by mouth daily as needed (leg cramps).    . hydrocortisone cream 1 % Apply 1 application topically daily as needed (irritation).    . Lancets (FREESTYLE) lancets Use as instructed to check blood sugar 2 times per day dx code E11.65 100 each 3  . lisinopril (PRINIVIL,ZESTRIL) 5 MG tablet Take 1 tablet (5 mg total) by mouth daily. 90 tablet 3  . metFORMIN (GLUCOPHAGE) 1000 MG tablet TAKE 1 TABLET BY MOUTH TWICE DAILY FOR BREAKFAST AND DINNER FOR DIABETES. (Patient taking differently: Take 1,000 mg by mouth 2 (two) times daily with a meal. ) 60 tablet 2  . miglitol (GLYSET) 50 MG tablet TAKE 1 TABLET BY MOUTH TWICE DAILY AT BREAKFAST AND DINNER FOR DIABETES. (Patient taking  differently: Take 50 mg by mouth 2 (two) times daily. ) 60 tablet 2  . multivitamin-lutein (OCUVITE-LUTEIN) CAPS capsule Take 1 capsule by mouth 2 (two) times daily.    Marland Kitchen oxyCODONE (ROXICODONE) 5 MG immediate release tablet Take 1 tablet (5 mg total) by mouth every 6 (six) hours as needed. 30 tablet 0  . tamsulosin (FLOMAX) 0.4 MG CAPS capsule Take 0.4 mg by mouth 2 (two) times daily.    Marland Kitchen VICTOZA 18 MG/3ML SOPN INJECT 1.8MG  INTO THE SKIN DAILY AT DINNERTIME FOR DIABETES. (Patient taking differently: Inject 1.8 mg into the skin daily with supper. ) 9 mL 2  . vitamin B-12 (CYANOCOBALAMIN) 1000 MCG tablet Take 1,000 mcg by mouth daily.     No current facility-administered medications for this visit.     Allergies  Allergen Reactions  . Penicillins Anaphylaxis    Has patient had a PCN reaction causing immediate rash, facial/tongue/throat swelling, SOB or lightheadedness with hypotension: Yes Has patient had a PCN reaction causing severe rash involving mucus membranes or skin necrosis: No Has patient had a PCN reaction that required hospitalization: Yes Has patient had a PCN reaction occurring within the last 10 years: Unknown If all of the above answers are "NO", then may proceed with Cephalosporin use.   Marland Kitchen Plavix [Clopidogrel Bisulfate] Hives  . Pletal [Cilostazol] Itching    Review of Systems  Constitutional: Negative for activity change, appetite change and unexpected weight change.  HENT: Negative for trouble swallowing and voice change.   Respiratory: Positive for cough.   Cardiovascular: Negative for chest pain and leg swelling.  Gastrointestinal: Negative for abdominal pain.  Musculoskeletal: Positive for arthralgias.  Skin:       Ulcer left toe    BP (!) 150/74  Pulse 83   Temp (!) 97.5 F (36.4 C)   Resp 20   Wt 166 lb 6.4 oz (75.5 kg)   SpO2 100%   BMI 21.36 kg/m  Physical Exam Vitals signs reviewed.  Constitutional:      General: He is not in acute  distress. HENT:     Head: Normocephalic and atraumatic.  Eyes:     General: No scleral icterus.    Extraocular Movements: Extraocular movements intact.     Conjunctiva/sclera: Conjunctivae normal.  Neck:     Musculoskeletal: Neck supple. No neck rigidity.  Cardiovascular:     Rate and Rhythm: Normal rate and regular rhythm.     Heart sounds: No murmur (faint systolic).  Pulmonary:     Effort: Pulmonary effort is normal. No respiratory distress.     Breath sounds: No wheezing or rales.     Comments: Diminished BS bilaterally Abdominal:     General: There is no distension.     Palpations: Abdomen is soft.     Tenderness: There is no abdominal tenderness.  Musculoskeletal:     Right lower leg: No edema.     Left lower leg: Edema present.  Lymphadenopathy:     Cervical: No cervical adenopathy.  Skin:    General: Skin is warm and dry.  Neurological:     General: No focal deficit present.     Mental Status: He is alert and oriented to person, place, and time.     Cranial Nerves: No cranial nerve deficit.     Motor: No weakness.     Coordination: Coordination normal.    Diagnostic Tests: NUCLEAR MEDICINE PET SKULL BASE TO THIGH  TECHNIQUE: 8.9 mCi F-18 FDG was injected intravenously. Full-ring PET imaging was performed from the skull base to thigh after the radiotracer. CT data was obtained and used for attenuation correction and anatomic localization.  Fasting blood glucose: 96 mg/dl  COMPARISON:  [id number] PET-CT. 06/29/2018 CT chest, abdomen and pelvis.  FINDINGS: Mediastinal blood pool activity: SUV max 1.7  Liver activity: SUV max NA  NECK: No hypermetabolic lymph nodes in the neck.  Incidental CT findings: none  CHEST: Hypermetabolic predominantly solid 5.4 x 5.4 cm anterior left upper lobe lung mass (series 8/image 36) with max SUV 11.3, increased from 4.1 x 3.7 cm with max SUV 9.2. No additional hypermetabolic pulmonary findings. No hypermetabolic  hilar, mediastinal or axillary lymph nodes.  Incidental CT findings: Three-vessel coronary atherosclerosis status post CABG. Atherosclerotic thoracic aorta with stable ectatic 4.2 cm ascending thoracic aorta. Severe centrilobular emphysema. Subpleural 5 mm anterior left lower lobe solid pulmonary nodule (series 8/image 31), stable using similar measurement technique, probably benign. No new significant pulmonary nodules. Intact sternotomy wires.  ABDOMEN/PELVIS: No abnormal hypermetabolic activity within the liver, pancreas, adrenal glands, or spleen. No hypermetabolic lymph nodes in the abdomen or pelvis. Diffuse bowel hypermetabolism without CT correlate, compatible with physiologic bowel activity. Low level uptake associated with low-attenuation fluid collections in the left inguinal region with surrounding fat stranding, largest measuring 3.3 x 3.1 cm (series 4/image 192), with new partially visualized left femoral-popliteal bypass graft. Surgical clips in the right inguinal region with associated 2.4 x 1.7 cm postoperative seroma (series 4/image 215). Atherosclerotic nonaneurysmal abdominal aorta. Diffuse colonic diverticulosis.  Incidental CT findings: Cholecystectomy.  SKELETON: No focal hypermetabolic activity to suggest skeletal metastasis.  Incidental CT findings: none  IMPRESSION: 1. Anterior left upper lobe 5.4 cm hypermetabolic lung mass (max SUV 11.3), increased in size and metabolism since  07/10/2018 PET-CT. Primary bronchogenic carcinoma remains the diagnosis of exclusion. 2. No hypermetabolic thoracic adenopathy or distant metastatic disease. 3. Low level uptake associated with low-attenuation fluid collections in the left greater than right inguinal regions, most compatible with postoperative seromas associated with right inguinal lymphadenectomy and left femoral-popliteal bypass graft surgery. 4. Aortic Atherosclerosis (ICD10-I70.0) and Emphysema  (ICD10-J43.9).   Electronically Signed   By: Ilona Sorrel M.D.   On: 10/29/2018 15:01 I personally reviewed the PET images and concur with the findings noted above. 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 50 py history of smoking prior to quitting in 2016.   Left upper lobe lung mass- 5.4 cm spiculated mass. Hypermetabolic in malignant range on PET. Most likely a primary lung cancer. Appearance on imaging would be highly unusual for metastatic disease. Granulomatous disease is also in the differential.  I reviewed the potential approaches to diagnosis and treatment with Mario [name]. I think the best option for diagnosis is navigational bronchoscopy. Not favorable for CT guided biopsy due to emphysema.   Discussed advantages and disadvantages of surgery v radiation for treatment of the left upper lobe mass. He is a marginal surgical candidate due to impaired diffusion capacity. DLCO of < 30% predicted postop is associated with increased risk of poor outcome. His flows are surprisingly well preserved given the severe bullous emphysema seen on CXFR. Unfortunately the left upper lobe seems to be the best preserved portion of the lung on CT. I think there is a high probability he would be oxygen dependent after a left upper lobectomy. He does not find that prospect acceptable and is leaning to XRT.  I discussed the procedure of navigational bronchoscopy with Mario [name]. He understands it is an endoscopic procedure and will done under general anesthesia. I informed him of the indications, risks, benefits and alternatives. He understands the risks include, but are not limited  to death, stroke, MI, DVT/PE, bleeding, pneumothorax and failure to make a diagnosis.  He agrees to proceed with ENB.  Plan: Navigational bronchoscopy for biopsy of left upper lobe lung mass  [name], MD [facility] [phone]

## 2018-11-09 ENCOUNTER — Encounter: Payer: Self-pay | Admitting: *Deleted

## 2018-11-09 ENCOUNTER — Other Ambulatory Visit: Payer: Self-pay | Admitting: *Deleted

## 2018-11-09 DIAGNOSIS — R918 Other nonspecific abnormal finding of lung field: Secondary | ICD-10-CM

## 2018-11-09 DIAGNOSIS — L97522 Non-pressure chronic ulcer of other part of left foot with fat layer exposed: Secondary | ICD-10-CM | POA: Diagnosis not present

## 2018-11-09 DIAGNOSIS — E11621 Type 2 diabetes mellitus with foot ulcer: Secondary | ICD-10-CM | POA: Diagnosis not present

## 2018-11-09 NOTE — Progress Notes (Signed)
Oncology Nurse Navigator Documentation  Oncology Nurse Navigator Flowsheets 11/09/2018  Navigator Location [facility]  Referral Date to RadOnc/MedOnc -  Navigator Encounter Type Clinic/MDC/I spoke with patient yesterday at thoracic clinic.  Patient treatment plan is tissue DX via bronchoscopy.  I help to explain treatment plan and next steps.    Telephone -  Abnormal Finding Date 07/19/2018  Multidisiplinary Clinic Date 11/08/2018  Patient Visit Type MedOnc  Treatment Phase Abnormal Scans  Barriers/Navigation Needs Coordination of Care;Education  Education Other  Interventions Coordination of Care;Education  Coordination of Care Other  Education Method Verbal  Acuity Level 2  Time Spent with Patient 73

## 2018-11-09 NOTE — Progress Notes (Signed)
I faxed ordered from Dr. Roxan Hockey to his office yesterday.  I called today to make sure they have received them and they did.

## 2018-11-13 ENCOUNTER — Other Ambulatory Visit (INDEPENDENT_AMBULATORY_CARE_PROVIDER_SITE_OTHER): Payer: PPO

## 2018-11-13 ENCOUNTER — Other Ambulatory Visit: Payer: Self-pay

## 2018-11-13 DIAGNOSIS — E1165 Type 2 diabetes mellitus with hyperglycemia: Secondary | ICD-10-CM

## 2018-11-13 LAB — COMPREHENSIVE METABOLIC PANEL
ALT: 32 U/L (ref 0–53)
AST: 24 U/L (ref 0–37)
Albumin: 3.9 g/dL (ref 3.5–5.2)
Alkaline Phosphatase: 64 U/L (ref 39–117)
BUN: 35 mg/dL — ABNORMAL HIGH (ref 6–23)
CO2: 20 mEq/L (ref 19–32)
Calcium: 9 mg/dL (ref 8.4–10.5)
Chloride: 109 mEq/L (ref 96–112)
Creatinine, Ser: 1.23 mg/dL (ref 0.40–1.50)
GFR: 56.97 mL/min — ABNORMAL LOW (ref >60.00)
Glucose, Bld: 167 mg/dL — ABNORMAL HIGH (ref 70–99)
Potassium: 4.4 mEq/L (ref 3.5–5.1)
Sodium: 137 mEq/L (ref 135–145)
Total Bilirubin: 0.6 mg/dL (ref 0.2–1.2)
Total Protein: 5.9 g/dL — ABNORMAL LOW (ref 6.0–8.3)

## 2018-11-13 LAB — HEMOGLOBIN A1C: Hgb A1c MFr Bld: 6.7 % — ABNORMAL HIGH (ref 4.6–6.5)

## 2018-11-15 ENCOUNTER — Other Ambulatory Visit: Payer: Self-pay

## 2018-11-15 ENCOUNTER — Ambulatory Visit (INDEPENDENT_AMBULATORY_CARE_PROVIDER_SITE_OTHER): Payer: PPO | Admitting: Endocrinology

## 2018-11-15 ENCOUNTER — Encounter: Payer: Self-pay | Admitting: Endocrinology

## 2018-11-15 VITALS — BP 150/72 | HR 85 | Temp 97.7°F | Ht 74.0 in | Wt 163.2 lb

## 2018-11-15 DIAGNOSIS — E1151 Type 2 diabetes mellitus with diabetic peripheral angiopathy without gangrene: Secondary | ICD-10-CM

## 2018-11-15 DIAGNOSIS — I10 Essential (primary) hypertension: Secondary | ICD-10-CM | POA: Diagnosis not present

## 2018-11-15 MED ORDER — LISINOPRIL 10 MG PO TABS: 10.0000 mg | ORAL_TABLET | Freq: Every day | ORAL | 1 refills | Status: DC

## 2018-11-15 NOTE — Patient Instructions (Addendum)
Take 2 of 5mg  Lisinopril  Check blood sugars on waking up 1-2 days a week  Also check blood sugars about 2 hours after meals and do this after different meals by rotation  Recommended blood sugar levels on waking up are 90-130 and about 2 hours after meal is 130-160  Please bring your blood sugar monitor to each visit, thank you

## 2018-11-15 NOTE — Progress Notes (Signed)
Patient ID: Mario [name], male   DOB: Sep 06, 1940, 78 y.o.   MRN: [id number]   Reason for Appointment: Diabetes follow-up   History of Present Illness   Diagnosis: Type 2 DIABETES MELITUS, date of diagnosis:  1989     Previous history: He was initially diagnosed when hospitalized for pancreatitis and his previous endocrinologist had treated him with multiple medications because of progression of his diabetes. He was also put on Cycloset  which he has tolerated maximum dose He has had adjustments of his medication dosages the last couple of years and Januvia was restarted in 5/14 when blood sugars were higher. Dosages have been adjusted based on renal function Previously an A1c levels have been ranging from 6.6-7.5 His metformin was increased  when renal function was better and glipizide was changed to glipizide ER 10 mg . Did not appear to have better blood sugar control with Invokana He was started on Victoza for improved control when his A1c was 7.9% in 02/2015  Recent history:    Non-insulin hypoglycemic drugs:  Glyset 50 mg bid, glipizide ER 10 mg , Metformin 1 g twice a day,   Victoza 1.8 mg daily       His A1c is improved back to 6.7, previously was relatively higher at 7.1  Blood sugar patterns and problems:  In the last few weeks he has not been able to exercise because of his gym being closed  Only the swelling he thinks he can start using his bicycle  However despite this his weight is about the same and his sugars are better  He is having variable blood sugars but again likely checking frequently and mostly midday  Fasting blood sugar was higher when he came in for his lab and he thinks this was from having dessert the night before  Also probably having dessert more frequently now  Did not bring glucose monitor today and difficult to know his patterns  He is still compliant with all his medications as directed including Victoza and Glyset  Side effects  from medications: None.  Monitors blood glucose: Once a day or less .    Glucometer: Freestyle     Recent range by recall: 97-154 and may be lower fasting also No hypoglycemic readings or symptoms  Previous readings:  PRE-MEAL Fasting Lunch Dinner Bedtime Overall  Glucose range:   77-175     Mean/median:      126    Meals: 2-3 meals per day, no lunch often (yogurt); breakfast is at 9 am cereal, [place] bacon, sometimes bread and eggs at breakfast; donuts occasionally      Dietician consultation last: 3/16        Physical activity: exercise: None recently. Was going to the gym 3+/7,Doing various exercises and playing pickle ball    Wt Readings from Last 3 Encounters:  11/15/18 163 lb 3.2 oz (74 kg)  11/08/18 166 lb 6.4 oz (75.5 kg)  11/05/18 165 lb (74.8 kg)    Lab Results  Component Value Date   HGBA1C 6.7 (H) 11/13/2018   HGBA1C 7.1 (H) 08/13/2018   HGBA1C 6.7 (H) 04/09/2018   Lab Results  Component Value Date   MICROALBUR 9.7 (H) 01/01/2018   LDLCALC 41 08/13/2018   CREATININE 1.23 11/13/2018     LABS:  Lab on 11/13/2018  Component Date Value Ref Range Status   Sodium 11/13/2018 137  135 - 145 mEq/L Final   Potassium 11/13/2018 4.4  3.5 - 5.1 mEq/L Final  Chloride 11/13/2018 109  96 - 112 mEq/L Final   CO2 11/13/2018 20  19 - 32 mEq/L Final   Glucose, Bld 11/13/2018 167* 70 - 99 mg/dL Final   BUN 11/13/2018 35* 6 - 23 mg/dL Final   Creatinine, Ser 11/13/2018 1.23  0.40 - 1.50 mg/dL Final   Total Bilirubin 11/13/2018 0.6  0.2 - 1.2 mg/dL Final   Alkaline Phosphatase 11/13/2018 64  39 - 117 U/L Final   AST 11/13/2018 24  0 - 37 U/L Final   ALT 11/13/2018 32  0 - 53 U/L Final   Total Protein 11/13/2018 5.9* 6.0 - 8.3 g/dL Final   Albumin 11/13/2018 3.9  3.5 - 5.2 g/dL Final   Calcium 11/13/2018 9.0  8.4 - 10.5 mg/dL Final   GFR 11/13/2018 56.97* >60.00 mL/min Final   Hgb A1c MFr Bld 11/13/2018 6.7* 4.6 - 6.5 % Final   Glycemic Control  Guidelines for People with Diabetes:Non Diabetic:  <6%Goal of Therapy: <7%Additional Action Suggested:  >8%     Allergies as of 11/15/2018      Reactions   Penicillins Anaphylaxis   Has patient had a PCN reaction causing immediate rash, facial/tongue/throat swelling, SOB or lightheadedness with hypotension: Yes Has patient had a PCN reaction causing severe rash involving mucus membranes or skin necrosis: No Has patient had a PCN reaction that required hospitalization: Yes Has patient had a PCN reaction occurring within the last 10 years: Unknown If all of the above answers are "NO", then may proceed with Cephalosporin use.   Plavix [clopidogrel Bisulfate] Hives   Pletal [cilostazol] Itching      Medication List       Accurate as of November 15, 2018 11:16 AM. If you have any questions, ask your nurse or doctor.        allopurinol 100 MG tablet Commonly known as: ZYLOPRIM Take 1 tablet by mouth daily What changed: when to take this   ARTIFICIAL TEAR OP Apply 1 drop to eye daily as needed (dry eyes).   aspirin EC 81 MG tablet Take 81 mg by mouth daily.   atorvastatin 40 MG tablet Commonly known as: LIPITOR take 1 tablet by mouth daily at 6:00pm What changed: See the new instructions.   calcium carbonate 500 MG chewable tablet Commonly known as: TUMS - dosed in mg elemental calcium Chew 1 tablet by mouth daily as needed for indigestion or heartburn.   carvedilol 3.125 MG tablet Commonly known as: COREG TAKE 1 TABLET BY MOUTH TWICE A DAY What changed: when to take this   clindamycin 150 MG capsule Commonly known as: Cleocin Take 1 capsule (150 mg total) by mouth 3 (three) times daily.   CoQ10 200 MG Caps Take 200 mg by mouth daily.   fenofibrate micronized 43 MG capsule Commonly known as: ANTARA Take 43 mg by mouth every evening.   ferrous sulfate 325 (65 FE) MG tablet Take 325 mg by mouth daily.   finasteride 5 MG tablet Commonly known as: PROSCAR Take 5 mg by  mouth every evening.   folic acid 976 MCG tablet Commonly known as: FOLVITE Take 800 mcg by mouth daily.   freestyle lancets Use as instructed to check blood sugar 2 times per day dx code E11.65   FreeStyle Lite Devi USE TO TEST BLOOD SUGAR TWICE DAILY   glipiZIDE 10 MG 24 hr tablet Commonly known as: GLUCOTROL XL TAKE 1 TABLET BY MOUTH AT BEDTIME FOR DIABETES. What changed: See the new instructions.  glucose blood test strip Commonly known as: FREESTYLE LITE Use as instructed to check blood sugar 2 times per day dx code E11.65   lisinopril 5 MG tablet Commonly known as: ZESTRIL Take 1 tablet (5 mg total) by mouth daily.   metFORMIN 1000 MG tablet Commonly known as: GLUCOPHAGE TAKE 1 TABLET BY MOUTH TWICE DAILY FOR BREAKFAST AND DINNER FOR DIABETES. What changed: See the new instructions.   miglitol 50 MG tablet Commonly known as: GLYSET TAKE 1 TABLET BY MOUTH TWICE DAILY AT BREAKFAST AND DINNER FOR DIABETES. What changed: See the new instructions.   multivitamin-lutein Caps capsule Take 1 capsule by mouth 2 (two) times daily.   oxyCODONE 5 MG immediate release tablet Commonly known as: Roxicodone Take 1 tablet (5 mg total) by mouth every 6 (six) hours as needed.   tamsulosin 0.4 MG Caps capsule Commonly known as: FLOMAX Take 0.4 mg by mouth 2 (two) times daily.   Victoza 18 MG/3ML Sopn Generic drug: liraglutide INJECT 1.8MG  INTO THE SKIN DAILY AT DINNERTIME FOR DIABETES. What changed: See the new instructions.   vitamin B-12 1000 MCG tablet Commonly known as: CYANOCOBALAMIN Take 1,000 mcg by mouth daily.   Vitamin D 50 MCG (2000 UT) Caps Take 2,000 Units by mouth daily.       Allergies:  Allergies  Allergen Reactions   Penicillins Anaphylaxis    Has patient had a PCN reaction causing immediate rash, facial/tongue/throat swelling, SOB or lightheadedness with hypotension: Yes Has patient had a PCN reaction causing severe rash involving mucus  membranes or skin necrosis: No Has patient had a PCN reaction that required hospitalization: Yes Has patient had a PCN reaction occurring within the last 10 years: Unknown If all of the above answers are "NO", then may proceed with Cephalosporin use.    Plavix [Clopidogrel Bisulfate] Hives   Pletal [Cilostazol] Itching    Past Medical History:  Diagnosis Date   Arthritis    CAD (coronary artery disease)    a.  s/p IMI and BMS 1997;  b. Myoview 4/16:  inf scar with peri-infarct ischemia, EF 34%; high risk;  c. [place] 5/16:  3 v CAD >> CABG (free L-LAD, S-OM, S-dRCA)   Carotid stenosis    a. carotid US 6/16:  bilat ICA 1-39%   Chronic kidney disease, stage II (mild)    Chronic systolic CHF (congestive heart failure) (HCC)    Essential hypertension, benign    Gout    HLD (hyperlipidemia)    Inguinal adenopathy 09/19/2018   Ischemic cardiomyopathy    a. EF by myoview 4/16 34%; b. LHC 5/16: EF 40-45%;  c. intraop TEE 6/16: EF 45-50%   Orthostasis    PAD (peripheral artery disease) ([place])    Dr. Fletcher Anon   Pneumonia    hx of   Type II or unspecified type diabetes mellitus without mention of complication, uncontrolled     Past Surgical History:  Procedure Laterality Date   ABDOMINAL AORTAGRAM N/A 10/09/2013   Procedure: ABDOMINAL Maxcine Ham;  Surgeon: Wellington Hampshire, MD;  Location: [place] [facility] CATH LAB;  Service: Cardiovascular;  Laterality: N/A;   ABDOMINAL AORTOGRAM N/A 12/28/2016   Procedure: ABDOMINAL AORTOGRAM;  Surgeon: Waynetta Sandy, MD;  Location: [facility];  Service: Cardiovascular;  Laterality: N/A;   ABDOMINAL AORTOGRAM W/LOWER EXTREMITY Bilateral 09/11/2018   Procedure: ABDOMINAL AORTOGRAM W/LOWER EXTREMITY;  Surgeon: Serafina Mitchell, MD;  Location: [facility];  Service: Cardiovascular;  Laterality: Bilateral;   CARDIAC CATHETERIZATION N/A 10/02/2014  Procedure: Left Heart Cath and Coronary Angiography;  Surgeon: Belva Crome, MD;   Location: [facility];  Service: Cardiovascular;  Laterality: N/A;   CATARACT EXTRACTION     CHOLECYSTECTOMY     COLONOSCOPY     CORONARY ARTERY BYPASS GRAFT N/A 11/03/2014   Procedure: CORONARY ARTERY BYPASS GRAFTING  times three using left internal mammary and right greater saphenous vein;  Surgeon: Gaye Pollack, MD;  Location: [facility] OR;  Service: Open Heart Surgery;  Laterality: N/A;   FEMORAL-POPLITEAL BYPASS GRAFT Left 09/18/2018   Procedure: LEFT ILEO FEMORAL ENDARTERECTOMY WITH VEIN PATCH AND ANGIOPLASTY, POPLITEAL  ENDARTERECTOMY, FEM POP BYPASS;  Surgeon: Serafina Mitchell, MD;  Location: [facility];  Service: Vascular;  Laterality: Left;   HERNIA REPAIR     LOWER EXTREMITY ANGIOGRAPHY Bilateral 12/28/2016   Procedure: Lower Extremity Angiography;  Surgeon: Waynetta Sandy, MD;  Location: [facility];  Service: Cardiovascular;  Laterality: Bilateral;   LYMPH NODE DISSECTION Right 09/19/2018   Procedure: SUPERFICIAL RIGHT LYMPH NODE DISSECTION;  Surgeon: Fanny Skates, MD;  Location: [facility];  Service: General;  Laterality: Right;   MELANOMA EXCISION     PERIPHERAL VASCULAR BALLOON ANGIOPLASTY Right 12/28/2016   Procedure: PERIPHERAL VASCULAR BALLOON ANGIOPLASTY;  Surgeon: Waynetta Sandy, MD;  Location: [place];  Service: Cardiovascular;  Laterality: Right;  SFA UNSUCCESSFUL PTA  UNABLE TO CROSS LESION   SKIN LESION EXCISION     multiple   TEE WITHOUT CARDIOVERSION N/A 11/03/2014   Procedure: TRANSESOPHAGEAL ECHOCARDIOGRAM (TEE);  Surgeon: Gaye Pollack, MD;  Location: [facility];  Service: Open Heart Surgery;  Laterality: N/A;    Family History  Problem Relation Age of Onset   Colon cancer Father    Heart disease Mother    Non-Hodgkin's lymphoma Daughter     Social History:  reports that he quit smoking about 4 years ago. His smoking use included cigarettes. He has a 25.00 pack-year smoking history. He has never used smokeless tobacco.  He reports that he does not drink alcohol or use drugs.  Review of Systems:   Hypertension:  taking lisinopril 5 mg and carvedilol low-dose This is usually prescribed by cardiologist However blood pressure appears to be more consistently higher He is under more stress because of his upcoming lung tumor surgery    BP Readings from Last 3 Encounters:  11/15/18 (!) 150/72  11/08/18 (!) 150/74  11/05/18 (!) 151/74     Lipids: Adequately controlled with atorvastatin 40 mg, LDL below 70 but HDL is  low, triglycerides normal     Lab Results  Component Value Date   CHOL 94 08/13/2018   HDL 30.00 (L) 08/13/2018   LDLCALC 41 08/13/2018   TRIG 117.0 08/13/2018   CHOLHDL 3 08/13/2018    Claudication: This is still limiting walking      Examination:   BP (!) 150/72 (BP Location: Left Arm, Patient Position: Sitting, Cuff Size: Normal)    Pulse 85    Temp 97.7 F (36.5 C) (Oral)    Ht 6\' 2"  (1.88 m)    Wt 163 lb 3.2 oz (74 kg)    SpO2 100%    BMI 20.95 kg/m   Body mass index is 20.95 kg/m.     ASSESSMENT/ PLAN:   Diabetes type 2, nonobese:  See history of present illness for detailed discussion of his current management, blood sugar patterns and problems identified  Blood glucose control is reasonably good with A1c improved  at 6.7  He is on multiple drugs including Victoza He may not have been consistent with his diet recently but overall control is excellent considering his multiple medical problems and also recent diagnosis of possible lung cancer  He is interested in resuming his exercise with riding his bicycle and encouraged him to do so We will try to check blood sugars at various times of the day Advised that he can be liberal with his diet given his circumstances  HYPERTENSION: Blood pressure is higher than usual and he can go up to 10 mg on lisinopril    There are no Patient Instructions on file for this visit.   [name] 11/15/2018, 11:16 AM

## 2018-11-16 NOTE — Progress Notes (Signed)
[facility] - [place], [place] - [address] [place] [address] Phone: [phone] Fax: [phone]  [id number] - [place], [place] [address] Phone: [phone] Fax: [phone]      Your procedure is scheduled on July 2nd.  Report to [facility] Main Entrance "A" at 10:00 A.M., and check in at the Admitting office.  Call this number if you have problems the morning of surgery:  [phone]  Call [phone] if you have any questions prior to your surgery date Monday-Friday 8am-4pm    Remember:  Do not eat or drink after midnight.   Take these medicines the morning of surgery with A SIP OF WATER   Eye drops - if needed  Atorvastatin (Lipitor)  Carvedilol (Coreg)  Clindamycin  Oxycodone   Follow your surgeon's instructions on when to stop Aspirin.  If no instructions were given by your surgeon then you will need to call the office to get those instructions.    7 days prior to surgery STOP taking any Aspirin (unless otherwise instructed by your surgeon), Aleve, Naproxen, Ibuprofen, Motrin, Advil, Goody's, BC's, all herbal medications, fish oil, and all vitamins.   WHAT DO I DO ABOUT MY DIABETES MEDICATION?   Marland Kitchen Do not take oral diabetes medicines (pills) the morning of surgery. - Metformin, Miglitol  . THE NIGHT BEFORE SURGERY, do NOT take Glipizide, or Victoza      . The day of surgery, do not take other diabetes injectables, including Byetta (exenatide), Bydureon (exenatide ER), Victoza (liraglutide), or Trulicity (dulaglutide).  . If your CBG is greater than 220 mg/dL, you may take  of your sliding scale (correction) dose of insulin.   How to Manage Your Diabetes Before and After Surgery  Why is it important to control my blood sugar before and after surgery? . Improving blood sugar levels before and after surgery helps healing and can limit  problems. . A way of improving blood sugar control is eating a healthy diet by: o  Eating less sugar and carbohydrates o  Increasing activity/exercise o  Talking with your doctor about reaching your blood sugar goals . High blood sugars (greater than 180 mg/dL) can raise your risk of infections and slow your recovery, so you will need to focus on controlling your diabetes during the weeks before surgery. . Make sure that the doctor who takes care of your diabetes knows about your planned surgery including the date and location.  How do I manage my blood sugar before surgery? . Check your blood sugar at least 4 times a day, starting 2 days before surgery, to make sure that the level is not too high or low. o Check your blood sugar the morning of your surgery when you wake up and every 2 hours until you get to the Short Stay unit. . If your blood sugar is less than 70 mg/dL, you will need to treat for low blood sugar: o Do not take insulin. o Treat a low blood sugar (less than 70 mg/dL) with  cup of clear juice (cranberry or apple), 4 glucose tablets, OR glucose gel. o Recheck blood sugar in 15 minutes after treatment (to make sure it is greater than 70 mg/dL). If your blood sugar is not greater than 70 mg/dL on recheck, call [phone] for further instructions. . Report your blood sugar to the short stay nurse when you get to Short Stay.  Marland Kitchen  If you are admitted to the hospital after surgery: o Your blood sugar will be checked by the staff and you will probably be given insulin after surgery (instead of oral diabetes medicines) to make sure you have good blood sugar levels. o The goal for blood sugar control after surgery is 80-180 mg/dL.    The Morning of Surgery  Do not wear jewelry, make-up or nail polish.  Do not wear lotions, powders, or perfumes/colognes, or deodorant  Do not shave 48 hours prior to surgery.   Do not bring valuables to the hospital.  [facility] is not responsible  for any belongings or valuables.  If you are a smoker, DO NOT Smoke 24 hours prior to surgery IF you wear a CPAP at night please bring your mask, tubing, and machine the morning of surgery   Remember that you must have someone to transport you home after your surgery, and remain with you for 24 hours if you are discharged the same day.   Contacts, glasses, hearing aids, dentures or bridgework may not be worn into surgery.    Leave your suitcase in the car.  After surgery it may be brought to your room.  For patients admitted to the hospital, discharge time will be determined by your treatment team.  Patients discharged the day of surgery will not be allowed to drive home.    Special instructions:   - Preparing For Surgery  Before surgery, you can play an important role. Because skin is not sterile, your skin needs to be as free of germs as possible. You can reduce the number of germs on your skin by washing with CHG (chlorahexidine gluconate) Soap before surgery.  CHG is an antiseptic cleaner which kills germs and bonds with the skin to continue killing germs even after washing.    Oral Hygiene is also important to reduce your risk of infection.  Remember - BRUSH YOUR TEETH THE MORNING OF SURGERY WITH YOUR REGULAR TOOTHPASTE  Please do not use if you have an allergy to CHG or antibacterial soaps. If your skin becomes reddened/irritated stop using the CHG.  Do not shave (including legs and underarms) for at least 48 hours prior to first CHG shower. It is OK to shave your face.  Please follow these instructions carefully.   1. Shower the NIGHT BEFORE SURGERY and the MORNING OF SURGERY with CHG Soap.   2. If you chose to wash your hair, wash your hair first as usual with your normal shampoo.  3. After you shampoo, rinse your hair and body thoroughly to remove the shampoo.  4. Use CHG as you would any other liquid soap. You can apply CHG directly to the skin and wash gently  with a scrungie or a clean washcloth.   5. Apply the CHG Soap to your body ONLY FROM THE NECK DOWN.  Do not use on open wounds or open sores. Avoid contact with your eyes, ears, mouth and genitals (private parts). Wash Face and genitals (private parts)  with your normal soap.   6. Wash thoroughly, paying special attention to the area where your surgery will be performed.  7. Thoroughly rinse your body with warm water from the neck down.  8. DO NOT shower/wash with your normal soap after using and rinsing off the CHG Soap.  9. Pat yourself dry with a CLEAN TOWEL.  10. Wear CLEAN PAJAMAS to bed the night before surgery, wear comfortable clothes the morning of surgery  11. Place  CLEAN SHEETS on your bed the night of your first shower and DO NOT SLEEP WITH PETS.   Day of Surgery:  Do not apply any deodorants/lotions.  Please wear clean clothes to the hospital/surgery center.   Remember to brush your teeth WITH YOUR REGULAR TOOTHPASTE.   Please read over the following fact sheets that you were given.

## 2018-11-19 ENCOUNTER — Other Ambulatory Visit (HOSPITAL_COMMUNITY)
Admission: RE | Admit: 2018-11-19 | Discharge: 2018-11-19 | Disposition: A | Discharge status: Home / Self Care | Payer: PPO | Source: Ambulatory Visit | Attending: Thoracic Surgery (Cardiothoracic Vascular Surgery) | Admitting: Thoracic Surgery (Cardiothoracic Vascular Surgery)

## 2018-11-19 ENCOUNTER — Ambulatory Visit (HOSPITAL_COMMUNITY)
Admission: RE | Admit: 2018-11-19 | Discharge: 2018-11-19 | Disposition: A | Discharge status: Home / Self Care | Payer: PPO | Source: Ambulatory Visit | Attending: Thoracic Surgery (Cardiothoracic Vascular Surgery) | Admitting: Thoracic Surgery (Cardiothoracic Vascular Surgery)

## 2018-11-19 ENCOUNTER — Encounter (HOSPITAL_COMMUNITY): Payer: Self-pay

## 2018-11-19 ENCOUNTER — Encounter (HOSPITAL_COMMUNITY)
Admission: RE | Admit: 2018-11-19 | Discharge: 2018-11-19 | Disposition: A | Discharge status: Home / Self Care | Payer: PPO | Source: Ambulatory Visit | Attending: Thoracic Surgery (Cardiothoracic Vascular Surgery) | Admitting: Thoracic Surgery (Cardiothoracic Vascular Surgery)

## 2018-11-19 ENCOUNTER — Other Ambulatory Visit: Payer: Self-pay

## 2018-11-19 DIAGNOSIS — R918 Other nonspecific abnormal finding of lung field: Secondary | ICD-10-CM | POA: Diagnosis not present

## 2018-11-19 DIAGNOSIS — Z87891 Personal history of nicotine dependence: Secondary | ICD-10-CM | POA: Diagnosis not present

## 2018-11-19 DIAGNOSIS — E1122 Type 2 diabetes mellitus with diabetic chronic kidney disease: Secondary | ICD-10-CM | POA: Diagnosis not present

## 2018-11-19 DIAGNOSIS — J439 Emphysema, unspecified: Secondary | ICD-10-CM | POA: Insufficient documentation

## 2018-11-19 DIAGNOSIS — I5022 Chronic systolic (congestive) heart failure: Secondary | ICD-10-CM | POA: Diagnosis not present

## 2018-11-19 DIAGNOSIS — Z1159 Encounter for screening for other viral diseases: Secondary | ICD-10-CM | POA: Insufficient documentation

## 2018-11-19 DIAGNOSIS — Z85828 Personal history of other malignant neoplasm of skin: Secondary | ICD-10-CM | POA: Insufficient documentation

## 2018-11-19 DIAGNOSIS — Z7984 Long term (current) use of oral hypoglycemic drugs: Secondary | ICD-10-CM | POA: Insufficient documentation

## 2018-11-19 DIAGNOSIS — Z01818 Encounter for other preprocedural examination: Secondary | ICD-10-CM | POA: Insufficient documentation

## 2018-11-19 DIAGNOSIS — I130 Hypertensive heart and chronic kidney disease with heart failure and stage 1 through stage 4 chronic kidney disease, or unspecified chronic kidney disease: Secondary | ICD-10-CM | POA: Insufficient documentation

## 2018-11-19 DIAGNOSIS — Z951 Presence of aortocoronary bypass graft: Secondary | ICD-10-CM | POA: Diagnosis not present

## 2018-11-19 DIAGNOSIS — I2510 Atherosclerotic heart disease of native coronary artery without angina pectoris: Secondary | ICD-10-CM | POA: Diagnosis not present

## 2018-11-19 DIAGNOSIS — Z79899 Other long term (current) drug therapy: Secondary | ICD-10-CM | POA: Diagnosis not present

## 2018-11-19 DIAGNOSIS — N182 Chronic kidney disease, stage 2 (mild): Secondary | ICD-10-CM | POA: Diagnosis not present

## 2018-11-19 HISTORY — DX: Anemia, unspecified: D64.9

## 2018-11-19 HISTORY — DX: Chronic obstructive pulmonary disease, unspecified: J44.9

## 2018-11-19 HISTORY — DX: Malignant (primary) neoplasm, unspecified: C80.1

## 2018-11-19 LAB — CBC
HCT: 32.7 % — ABNORMAL LOW (ref 39.0–52.0)
Hemoglobin: 9.8 g/dL — ABNORMAL LOW (ref 13.0–17.0)
MCH: 27.1 pg (ref 26.0–34.0)
MCHC: 30 g/dL (ref 30.0–36.0)
MCV: 90.6 fL (ref 80.0–100.0)
Platelets: 200 10*3/uL (ref 150–400)
RBC: 3.61 MIL/uL — ABNORMAL LOW (ref 4.22–5.81)
RDW: 14.6 % (ref 11.5–15.5)
WBC: 7.1 10*3/uL (ref 4.0–10.5)
nRBC: 0 % (ref 0.0–0.2)

## 2018-11-19 LAB — COMPREHENSIVE METABOLIC PANEL
ALT: 44 U/L (ref 0–44)
AST: 36 U/L (ref 15–41)
Albumin: 3.7 g/dL (ref 3.5–5.0)
Alkaline Phosphatase: 67 U/L (ref 38–126)
Anion gap: 7 (ref 5–15)
BUN: 33 mg/dL — ABNORMAL HIGH (ref 8–23)
CO2: 20 mmol/L — ABNORMAL LOW (ref 22–32)
Calcium: 9.8 mg/dL (ref 8.9–10.3)
Chloride: 112 mmol/L — ABNORMAL HIGH (ref 98–111)
Creatinine, Ser: 1.43 mg/dL — ABNORMAL HIGH (ref 0.61–1.24)
GFR calc Af Amer: 54 mL/min — ABNORMAL LOW (ref >60)
GFR calc non Af Amer: 47 mL/min — ABNORMAL LOW (ref >60)
Glucose, Bld: 150 mg/dL — ABNORMAL HIGH (ref 70–99)
Potassium: 4.7 mmol/L (ref 3.5–5.1)
Sodium: 139 mmol/L (ref 135–145)
Total Bilirubin: 0.8 mg/dL (ref 0.3–1.2)
Total Protein: 6.3 g/dL — ABNORMAL LOW (ref 6.5–8.1)

## 2018-11-19 LAB — PROTIME-INR
INR: 1 (ref 0.8–1.2)
Prothrombin Time: 13.3 seconds (ref 11.4–15.2)

## 2018-11-19 LAB — GLUCOSE, CAPILLARY: Glucose-Capillary: 140 mg/dL — ABNORMAL HIGH (ref 70–99)

## 2018-11-19 LAB — SARS CORONAVIRUS 2 (TAT 6-24 HRS): SARS Coronavirus 2: NEGATIVE

## 2018-11-19 LAB — APTT: aPTT: 31 seconds (ref 24–36)

## 2018-11-19 NOTE — Progress Notes (Signed)
PCP - Dr. [name] Cardiologist - Dr. [name]  Chest x-ray -11/19/18  EKG - 12/25/17 Stress Test -09/10/14  ECHO - 03/26/15 Cardiac Cath - 10/02/14  Sleep Study - denies CPAP - denies  Fasting Blood Sugar - 120-150 Checks Blood Sugar ___1__ time a day CBG at PAT 140 A1C 11/13/18-6.7  Blood Thinner Instructions: N/A Aspirin Instructions: Pt did not know when to stop. Pt has an appointment with Levonne Spiller. RN after PAT appointment and will get information at that time.   Anesthesia review: Yes, hx of CAD.   Patient denies shortness of breath, fever, cough and chest pain at PAT appointment   Patient verbalized understanding of instructions that were given to them at the PAT appointment. Patient was also instructed that they will need to review over the PAT instructions again at home before surgery.   Pt to have COVID screening today after PAT appointment.    Coronavirus Screening  Have you experienced the following symptoms:  Cough yes/no: No Fever (>100.74F)  yes/no: No Runny nose yes/no: No Sore throat yes/no: No Difficulty breathing/shortness of breath  yes/no: No  Have you or a family member traveled in the last 14 days and where? yes/no: No   If the patient indicates "YES" to the above questions, their PAT will be rescheduled to limit the exposure to others and, the surgeon will be notified. THE PATIENT WILL NEED TO BE ASYMPTOMATIC FOR 14 DAYS.   If the patient is not experiencing any of these symptoms, the PAT nurse will instruct them to NOT bring anyone with them to their appointment since they may have these symptoms or traveled as well.   Please remind your patients and families that hospital visitation restrictions are in effect and the importance of the restrictions.

## 2018-11-19 NOTE — Progress Notes (Signed)
[facility] - [place], [place] - [address] Kristeen Mans [place] [address] Phone: [phone] Fax: [phone]  [id number] - [place], [place] [address] Phone: [phone] Fax: [phone]      Your procedure is scheduled on July 2nd.  Report to [facility] Main Entrance "A" at 10:00 A.M., and check in at the Admitting office.  Call this number if you have problems the morning of surgery:  [phone]  Call [phone] if you have any questions prior to your surgery date Monday-Friday 8am-4pm    Remember:  Do not eat or drink after midnight.   Take these medicines the morning of surgery with A SIP OF WATER   Eye drops - if needed  Carvedilol (Coreg)  Clindamycin (Cleocin)  Oxycodone-as needed for pain  Tamsulosin (FLOMAX)    Follow your surgeon's instructions on when to stop Aspirin.  If no instructions were given by your surgeon then you will need to call the office to get those instructions.    As of today,  STOP taking any Aleve, Naproxen, Ibuprofen, Motrin, Advil, Goody's, BC's, all herbal medications, fish oil, and all vitamins.   WHAT DO I DO ABOUT MY DIABETES MEDICATION?   Marland Kitchen Do not take oral diabetes medicines (pills) the morning of surgery. - Metformin, Miglitol  . THE NIGHT BEFORE SURGERY, do NOT take Glipizide, or Victoza      . The day of surgery, do not take other diabetes injectables, including Byetta (exenatide), Bydureon (exenatide ER), Victoza (liraglutide), or Trulicity (dulaglutide).  . If your CBG is greater than 220 mg/dL, you may take  of your sliding scale (correction) dose of insulin.   How to Manage Your Diabetes Before and After Surgery  Why is it important to control my blood sugar before and after surgery? . Improving blood sugar levels before and after surgery helps healing and can limit problems. . A way of improving blood  sugar control is eating a healthy diet by: o  Eating less sugar and carbohydrates o  Increasing activity/exercise o  Talking with your doctor about reaching your blood sugar goals . High blood sugars (greater than 180 mg/dL) can raise your risk of infections and slow your recovery, so you will need to focus on controlling your diabetes during the weeks before surgery. . Make sure that the doctor who takes care of your diabetes knows about your planned surgery including the date and location.  How do I manage my blood sugar before surgery? . Check your blood sugar at least 4 times a day, starting 2 days before surgery, to make sure that the level is not too high or low. o Check your blood sugar the morning of your surgery when you wake up and every 2 hours until you get to the Short Stay unit. . If your blood sugar is less than 70 mg/dL, you will need to treat for low blood sugar: o Do not take insulin. o Treat a low blood sugar (less than 70 mg/dL) with  cup of clear juice (cranberry or apple), 4 glucose tablets, OR glucose gel. o Recheck blood sugar in 15 minutes after treatment (to make sure it is greater than 70 mg/dL). If your blood sugar is not greater than 70 mg/dL on recheck, call [phone] for further instructions. . Report your blood sugar to the short stay nurse when you get to Short Stay.  . If you are  admitted to the hospital after surgery: o Your blood sugar will be checked by the staff and you will probably be given insulin after surgery (instead of oral diabetes medicines) to make sure you have good blood sugar levels. o The goal for blood sugar control after surgery is 80-180 mg/dL.    The Morning of Surgery  Do not wear jewelry, make-up or nail polish.  Do not wear lotions, powders, or perfumes/colognes, or deodorant  Do not shave 48 hours prior to surgery.   Do not bring valuables to the hospital.  [facility] is not responsible for any belongings or valuables.  If  you are a smoker, DO NOT Smoke 24 hours prior to surgery IF you wear a CPAP at night please bring your mask, tubing, and machine the morning of surgery   Remember that you must have someone to transport you home after your surgery, and remain with you for 24 hours if you are discharged the same day.   Contacts, glasses, hearing aids, dentures or bridgework may not be worn into surgery.    Leave your suitcase in the car.  After surgery it may be brought to your room.  For patients admitted to the hospital, discharge time will be determined by your treatment team.  Patients discharged the day of surgery will not be allowed to drive home.    Special instructions:   [facility]- Preparing For Surgery  Before surgery, you can play an important role. Because skin is not sterile, your skin needs to be as free of germs as possible. You can reduce the number of germs on your skin by washing with CHG (chlorahexidine gluconate) Soap before surgery.  CHG is an antiseptic cleaner which kills germs and bonds with the skin to continue killing germs even after washing.    Oral Hygiene is also important to reduce your risk of infection.  Remember - BRUSH YOUR TEETH THE MORNING OF SURGERY WITH YOUR REGULAR TOOTHPASTE  Please do not use if you have an allergy to CHG or antibacterial soaps. If your skin becomes reddened/irritated stop using the CHG.  Do not shave (including legs and underarms) for at least 48 hours prior to first CHG shower. It is OK to shave your face.  Please follow these instructions carefully.   1. Shower the NIGHT BEFORE SURGERY and the MORNING OF SURGERY with CHG Soap.   2. If you chose to wash your hair, wash your hair first as usual with your normal shampoo.  3. After you shampoo, rinse your hair and body thoroughly to remove the shampoo.  4. Use CHG as you would any other liquid soap. You can apply CHG directly to the skin and wash gently with a scrungie or a clean washcloth.    5. Apply the CHG Soap to your body ONLY FROM THE NECK DOWN.  Do not use on open wounds or open sores. Avoid contact with your eyes, ears, mouth and genitals (private parts). Wash Face and genitals (private parts)  with your normal soap.   6. Wash thoroughly, paying special attention to the area where your surgery will be performed.  7. Thoroughly rinse your body with warm water from the neck down.  8. DO NOT shower/wash with your normal soap after using and rinsing off the CHG Soap.  9. Pat yourself dry with a CLEAN TOWEL.  10. Wear CLEAN PAJAMAS to bed the night before surgery, wear comfortable clothes the morning of surgery  11. Place CLEAN SHEETS on  your bed the night of your first shower and DO NOT SLEEP WITH PETS.   Day of Surgery:  Do not apply any deodorants/lotions.  Please wear clean clothes to the hospital/surgery center.   Remember to brush your teeth WITH YOUR REGULAR TOOTHPASTE.   Please read over the following fact sheets that you were given.

## 2018-11-20 ENCOUNTER — Encounter (INDEPENDENT_AMBULATORY_CARE_PROVIDER_SITE_OTHER): Payer: PPO | Admitting: Ophthalmology

## 2018-11-20 DIAGNOSIS — H353122 Nonexudative age-related macular degeneration, left eye, intermediate dry stage: Secondary | ICD-10-CM | POA: Diagnosis not present

## 2018-11-20 DIAGNOSIS — H43813 Vitreous degeneration, bilateral: Secondary | ICD-10-CM | POA: Diagnosis not present

## 2018-11-20 DIAGNOSIS — I10 Essential (primary) hypertension: Secondary | ICD-10-CM

## 2018-11-20 DIAGNOSIS — H35033 Hypertensive retinopathy, bilateral: Secondary | ICD-10-CM | POA: Diagnosis not present

## 2018-11-20 NOTE — Anesthesia Preprocedure Evaluation (Addendum)
Anesthesia Evaluation  Patient identified by MRN, date of birth, ID band Patient awake    Reviewed: Allergy & Precautions, NPO status , Patient's Chart, lab work & pertinent test results  Airway Mallampati: II  TM Distance: >3 FB Neck ROM: Full    Dental no notable dental hx.    Pulmonary COPD, former smoker,    Pulmonary exam normal breath sounds clear to auscultation       Cardiovascular hypertension, Pt. on medications + CAD, + Cardiac Stents and + CABG  Normal cardiovascular exam Rhythm:Regular Rate:Normal     Neuro/Psych negative neurological ROS  negative psych ROS   GI/Hepatic negative GI ROS, Neg liver ROS,   Endo/Other  negative endocrine ROSdiabetes, Type 2  Renal/GU Renal InsufficiencyRenal disease  negative genitourinary   Musculoskeletal  (+) Arthritis , Osteoarthritis,    Abdominal   Peds negative pediatric ROS (+)  Hematology negative hematology ROS (+)   Anesthesia Other Findings   Reproductive/Obstetrics negative OB ROS                            Anesthesia Physical Anesthesia Plan  ASA: III  Anesthesia Plan: General   Post-op Pain Management:    Induction: Intravenous  PONV Risk Score and Plan: 2 and Ondansetron, Midazolam and Treatment may vary due to age or medical condition  Airway Management Planned: Oral ETT  Additional Equipment:   Intra-op Plan:   Post-operative Plan: Extubation in OR  Informed Consent: I have reviewed the patients History and Physical, chart, labs and discussed the procedure including the risks, benefits and alternatives for the proposed anesthesia with the patient or authorized representative who has indicated his/her understanding and acceptance.     Dental advisory given  Plan Discussed with: CRNA  Anesthesia Plan Comments: (PAT note written 11/20/2018 by Myra Gianotti, PA-C. HGB 9.8. If T&S desired, then anesthesiologist  and/or surgeon would need to order on the day of surgery.   )       Anesthesia Quick Evaluation

## 2018-11-20 NOTE — Progress Notes (Signed)
Anesthesia Chart Review:  Case: [id number] Date/Time: 11/22/18 1152   Procedure: VIDEO BRONCHOSCOPY WITH ENDOBRONCHIAL NAVIGATION (N/A )   Anesthesia type: General   Pre-op diagnosis: LUL MASS   Location: [facility] OR ROOM 10 / [place] OR   Surgeon: [name], MD      DISCUSSION: Patient is a 78 year old male scheduled for the above procedure. He underwent FPBG on 09/18/18. 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 A right inguinal LN biopsy was done on 09/19/18 which confirmed metastatic SCC. Further evaluation of LUL lung mass now recommended--primary lung cancer, granulomatous disease, and metastatic disease are in the differential.  History includes former smoker (quit 2016), CAD (inferior MI s/p BMS 1997; s/p CABG: Free LIMA-LAD, SVG-OM, SVG-dRCA 11/03/14), ischemic cardiomyopathy (EF improved to 50-55% 26/8341), chronic systolic CHF, 4.2 TAA (by 06/2018 PET), 4.0 cm AAA (by 06/2018 CTA), PAD with left 2nd toe ulcer (s/p left CFA-Popliteal artery BPG, left EIA and popliteal artery endarterectomies 09/18/18), HTN, HLD, CKD (stage II), DM2, carotid stenosis (RICA 1-39% 2020), skin cancer (BCC, right malleolus, s/p MOH's/skin graft 07/2017, Dr. [name]; right knee [facility] 04/2018 with inguinal metastasis and lung mass of unknown etiology; history also lists melanoma excision, but not seen in oncology records). Hard of hearing.  He denied SOB, cough, fever, and chest pain at PAT RN visit. He tolerated recent vascular bypass surgery. His HGB is 9.8, but up from 7.4 since his FPBG and pre-FPBG baseline HGB appears to be around 10. Based on currently available information, I would anticipate that he can proceed as planned. Defer to surgeon and/or anesthesiologist if T&S desired (if so, would need order). Presurgical COVID test negative on 11/19/18.    VS: BP (!) 146/72   Pulse 89   Temp 36.5 C (Oral)   Resp 20    Ht 6\' 2"  (1.88 m)   Wt 74.5 kg   SpO2 100%   BMI 21.09 kg/m    PROVIDERS: [name], MD is PCP - [name], MD is cardiologist. Last visit 12/25/17 by [name], NP. No CV symptoms and still playing Pickleball (claudication symptoms after ~ 30 minutes) at that time. One year follow-up recommended. He was felt to be "acceptable risk" for recent inguinal lymph node dissection. - [name], MD is endocrinologist. Last visit 11/15/18. [name], MD is pulmonologist. Last visit 08/16/18.  [name], MD is oncologist. Last visit 10/12/18. [name], MD is RAD-ONC, consult note from 11/01/18. [name], V. Rock Nephew, MD is vascular surgeon. Last visit 11/05/18 with [name], [name], NP. Patient still felt at high risk to require future left 2nd toe amputation. He was started on antibiotic therapy with [facility]. Six week follow-up recommended.   LABS: Preoperative labs noted. Cr. 1.43 which appears overall stable (Cr primarily ~ 1.3-1.40 since 2019 with range of 1.18-1.53). HGB 9.8, up from 7.4 which was post-FPBG (with his pre-FBPG HGB of 10.4).  A1c 6.7 on 11/13/18. (all labs ordered are listed, but only abnormal results are displayed)  Labs Reviewed  GLUCOSE, CAPILLARY - Abnormal; Notable for the following components:      Result Value   Glucose-Capillary 140 (*)    All other components within normal limits  CBC - Abnormal; Notable for the following components:   RBC 3.61 (*)    Hemoglobin 9.8 (*)    HCT 32.7 (*)  All other components within normal limits  COMPREHENSIVE METABOLIC PANEL - Abnormal; Notable for the following components:   Chloride 112 (*)    CO2 20 (*)    Glucose, Bld 150 (*)    BUN 33 (*)    Creatinine, Ser 1.43 (*)    Total Protein 6.3 (*)    GFR calc non Af Amer 47 (*)    GFR calc Af Amer 54 (*)    All other components within normal limits  APTT  PROTIME-INR    PFTs 11/08/18: FVC 5.37 (114%) FEV1  2.89 (85%) FEV1 post 3.19 (94%) TLC 8.44(110%) RV 3.68(133%) DLCO unc 10.11 (37%)   IMAGES: CXR 11/19/18: IMPRESSION: Persistent anterior left upper lobe opacity corresponding to the mass on PET-CT. Emphysema.  PET Scan 10/29/18: IMPRESSION: 1. Anterior left upper lobe 5.4 cm hypermetabolic lung mass (max SUV 11.3), increased in size and metabolism since 07/10/2018 PET-CT. Primary bronchogenic carcinoma remains the diagnosis of exclusion. 2. No hypermetabolic thoracic adenopathy or distant metastatic disease. 3. Low level uptake associated with low-attenuation fluid collections in the left greater than right inguinal regions, most compatible with postoperative seromas associated with right inguinal lymphadenectomy and left femoral-popliteal bypass graft surgery. 4. Aortic Atherosclerosis (ICD10-I70.0) and Emphysema (ICD10-J43.9).   EKG: 12/25/17 ([facility]): SR with first degree AV block. Abnormal QRS-T angle, consider primary T wave abnormality. [flat T wave I, mildly negative in aVL]. ST changes felt non-specific by cardiology at that visit.     CV: Carotid US 11/05/18: Summary: Right Carotid: Velocities in the right ICA are consistent with a 1-39% stenosis.                Large calcified plaque at the carotid bifurcation with acoustic                shadowing, visualization limited. Left Carotid: There is no evidence of stenosis in the left ICA.               Non-hemodynamically significant plaque noted in the CCA. Large               calcified plaque at the carotid bifurcation with acoustic               shadowing, visualization limited. Vertebrals:  Bilateral vertebral arteries demonstrate antegrade flow. Subclavians: Normal flow hemodynamics were seen in bilateral subclavian              arteries.  Echo 03/26/15: Study Conclusions - Left ventricle: The cavity size was normal. Wall thickness was increased in a pattern of mild LVH. Systolic function was  normal. The estimated ejection fraction was in the range of 50% to 55%. Doppler parameters are consistent with abnormal left ventricular relaxation (grade 1 diastolic dysfunction). - Mitral valve: There was mild regurgitation. - Left atrium: The atrium was mildly dilated. (Previously 45-50% by 11/03/14 echo, 37% 09/10/14 stress test)  Last cardiac cath was 10/02/14 PRE-CABG and showed 3V CAD, basal to mid inferior wall akinesis, EF 40%. S/p CABG 11/03/14.   Past Medical History:  Diagnosis Date  . Anemia   . Arthritis   . CAD (coronary artery disease)    a.  s/p IMI and BMS 1997;  b. Myoview 4/16:  inf scar with peri-infarct ischemia, EF 34%; high risk;  c. LHC 5/16:  3 v CAD >> CABG (free L-LAD, S-OM, S-dRCA)  . Cancer ([facility])    skin cancer  . Carotid stenosis    a. carotid US 6/16:  bilat ICA 1-39%  .  Chronic kidney disease, stage II (mild)   . Chronic systolic CHF (congestive heart failure) ([facility])   . COPD (chronic obstructive pulmonary disease) ([facility])   . Essential hypertension, benign   . Gout   . HLD (hyperlipidemia)   . Inguinal adenopathy 09/19/2018  . Ischemic cardiomyopathy    a. EF by myoview 4/16 34%; b. LHC 5/16: EF 40-45%;  c. intraop TEE 6/16: EF 45-50%  . Orthostasis   . PAD (peripheral artery disease) ([facility])    Dr. Fletcher Anon  . Pneumonia    hx of  . Type II or unspecified type diabetes mellitus without mention of complication, uncontrolled     Past Surgical History:  Procedure Laterality Date  . ABDOMINAL AORTAGRAM N/A 10/09/2013   Procedure: ABDOMINAL Maxcine Ham;  Surgeon: Wellington Hampshire, MD;  Location: [place] CATH LAB;  Service: Cardiovascular;  Laterality: N/A;  . ABDOMINAL AORTOGRAM N/A 12/28/2016   Procedure: ABDOMINAL AORTOGRAM;  Surgeon: Waynetta Sandy, MD;  Location: [place] CV LAB;  Service: Cardiovascular;  Laterality: N/A;  . ABDOMINAL AORTOGRAM W/LOWER EXTREMITY Bilateral 09/11/2018   Procedure: ABDOMINAL AORTOGRAM W/LOWER EXTREMITY;   Surgeon: Serafina Mitchell, MD;  Location: [place] CV LAB;  Service: Cardiovascular;  Laterality: Bilateral;  . CARDIAC CATHETERIZATION N/A 10/02/2014   Procedure: Left Heart Cath and Coronary Angiography;  Surgeon: Belva Crome, MD;  Location: [place] CV LAB;  Service: Cardiovascular;  Laterality: N/A;  . CATARACT EXTRACTION    . CHOLECYSTECTOMY    . COLONOSCOPY    . CORONARY ARTERY BYPASS GRAFT N/A 11/03/2014   Procedure: CORONARY ARTERY BYPASS GRAFTING  times three using left internal mammary and right greater saphenous vein;  Surgeon: Gaye Pollack, MD;  Location: [place] [facility] OR;  Service: Open Heart Surgery;  Laterality: N/A;  . FEMORAL-POPLITEAL BYPASS GRAFT Left 09/18/2018   Procedure: LEFT ILEO FEMORAL ENDARTERECTOMY WITH VEIN PATCH AND ANGIOPLASTY, POPLITEAL  ENDARTERECTOMY, FEM POP BYPASS;  Surgeon: Serafina Mitchell, MD;  Location: [facility];  Service: Vascular;  Laterality: Left;  . HERNIA REPAIR    . LOWER EXTREMITY ANGIOGRAPHY Bilateral 12/28/2016   Procedure: Lower Extremity Angiography;  Surgeon: Waynetta Sandy, MD;  Location: [place] CV LAB;  Service: Cardiovascular;  Laterality: Bilateral;  . LYMPH NODE DISSECTION Right 09/19/2018   Procedure: SUPERFICIAL RIGHT LYMPH NODE DISSECTION;  Surgeon: Fanny Skates, MD;  Location: [facility];  Service: General;  Laterality: Right;  . MELANOMA EXCISION    . PERIPHERAL VASCULAR BALLOON ANGIOPLASTY Right 12/28/2016   Procedure: PERIPHERAL VASCULAR BALLOON ANGIOPLASTY;  Surgeon: Waynetta Sandy, MD;  Location: [place] CV LAB;  Service: Cardiovascular;  Laterality: Right;  SFA UNSUCCESSFUL PTA  UNABLE TO CROSS LESION  . SKIN LESION EXCISION     multiple  . TEE WITHOUT CARDIOVERSION N/A 11/03/2014   Procedure: TRANSESOPHAGEAL ECHOCARDIOGRAM (TEE);  Surgeon: Gaye Pollack, MD;  Location: [facility];  Service: Open Heart Surgery;  Laterality: N/A;    MEDICATIONS: . allopurinol (ZYLOPRIM) 100 MG tablet  . ARTIFICIAL TEAR OP  .  aspirin EC 81 MG tablet  . atorvastatin (LIPITOR) 40 MG tablet  . Blood Glucose Monitoring Suppl (FREESTYLE LITE) DEVI  . calcium carbonate (TUMS - DOSED IN MG ELEMENTAL CALCIUM) 500 MG chewable tablet  . carvedilol (COREG) 3.125 MG tablet  . Cholecalciferol (VITAMIN D) 2000 units CAPS  . clindamycin (CLEOCIN) 150 MG capsule  . Coenzyme Q10 (COQ10) 200 MG CAPS  . fenofibrate micronized (ANTARA) 43 MG capsule  . ferrous sulfate 325 (65  FE) MG tablet  . finasteride (PROSCAR) 5 MG tablet  . folic acid (FOLVITE) 916 MCG tablet  . glipiZIDE (GLUCOTROL XL) 10 MG 24 hr tablet  . glucose blood (FREESTYLE LITE) test strip  . Lancets (FREESTYLE) lancets  . lisinopril (ZESTRIL) 10 MG tablet  . metFORMIN (GLUCOPHAGE) 1000 MG tablet  . miglitol (GLYSET) 50 MG tablet  . multivitamin-lutein (OCUVITE-LUTEIN) CAPS capsule  . oxyCODONE (ROXICODONE) 5 MG immediate release tablet  . tamsulosin (FLOMAX) 0.4 MG CAPS capsule  . VICTOZA 18 MG/3ML SOPN  . vitamin B-12 (CYANOCOBALAMIN) 1000 MCG tablet   No current facility-administered medications for this encounter.     Myra Gianotti, PA-C Surgical Short Stay/Anesthesiology [facility] Phone [phone] [facility] Phone [phone] 11/20/2018 11:54 AM

## 2018-11-21 DIAGNOSIS — C44722 Squamous cell carcinoma of skin of right lower limb, including hip: Secondary | ICD-10-CM | POA: Diagnosis not present

## 2018-11-22 ENCOUNTER — Ambulatory Visit (HOSPITAL_COMMUNITY)
Admission: RE | Admit: 2018-11-22 | Discharge: 2018-11-22 | Disposition: A | Discharge status: Home / Self Care | Payer: PPO | Attending: Thoracic Surgery (Cardiothoracic Vascular Surgery) | Admitting: Thoracic Surgery (Cardiothoracic Vascular Surgery)

## 2018-11-22 ENCOUNTER — Encounter (HOSPITAL_COMMUNITY): Payer: Self-pay

## 2018-11-22 ENCOUNTER — Ambulatory Visit (HOSPITAL_COMMUNITY): Payer: PPO | Admitting: Certified Registered"

## 2018-11-22 ENCOUNTER — Other Ambulatory Visit: Payer: Self-pay

## 2018-11-22 ENCOUNTER — Ambulatory Visit (HOSPITAL_COMMUNITY): Payer: PPO

## 2018-11-22 ENCOUNTER — Encounter (HOSPITAL_COMMUNITY)
Admission: RE | Disposition: A | Discharge status: Home / Self Care | Payer: Self-pay | Source: Home / Self Care | Attending: Thoracic Surgery (Cardiothoracic Vascular Surgery)

## 2018-11-22 ENCOUNTER — Ambulatory Visit (HOSPITAL_COMMUNITY): Payer: PPO | Admitting: Physician Assistant

## 2018-11-22 DIAGNOSIS — Z7982 Long term (current) use of aspirin: Secondary | ICD-10-CM | POA: Diagnosis not present

## 2018-11-22 DIAGNOSIS — C3412 Malignant neoplasm of upper lobe, left bronchus or lung: Secondary | ICD-10-CM | POA: Insufficient documentation

## 2018-11-22 DIAGNOSIS — Z955 Presence of coronary angioplasty implant and graft: Secondary | ICD-10-CM | POA: Diagnosis not present

## 2018-11-22 DIAGNOSIS — Z7984 Long term (current) use of oral hypoglycemic drugs: Secondary | ICD-10-CM | POA: Insufficient documentation

## 2018-11-22 DIAGNOSIS — I5022 Chronic systolic (congestive) heart failure: Secondary | ICD-10-CM | POA: Diagnosis not present

## 2018-11-22 DIAGNOSIS — E1122 Type 2 diabetes mellitus with diabetic chronic kidney disease: Secondary | ICD-10-CM | POA: Insufficient documentation

## 2018-11-22 DIAGNOSIS — I130 Hypertensive heart and chronic kidney disease with heart failure and stage 1 through stage 4 chronic kidney disease, or unspecified chronic kidney disease: Secondary | ICD-10-CM | POA: Insufficient documentation

## 2018-11-22 DIAGNOSIS — Z87891 Personal history of nicotine dependence: Secondary | ICD-10-CM | POA: Diagnosis not present

## 2018-11-22 DIAGNOSIS — Z951 Presence of aortocoronary bypass graft: Secondary | ICD-10-CM | POA: Insufficient documentation

## 2018-11-22 DIAGNOSIS — Z419 Encounter for procedure for purposes other than remedying health state, unspecified: Secondary | ICD-10-CM

## 2018-11-22 DIAGNOSIS — Z79899 Other long term (current) drug therapy: Secondary | ICD-10-CM | POA: Insufficient documentation

## 2018-11-22 DIAGNOSIS — M109 Gout, unspecified: Secondary | ICD-10-CM | POA: Diagnosis not present

## 2018-11-22 DIAGNOSIS — J439 Emphysema, unspecified: Secondary | ICD-10-CM | POA: Diagnosis not present

## 2018-11-22 DIAGNOSIS — E1151 Type 2 diabetes mellitus with diabetic peripheral angiopathy without gangrene: Secondary | ICD-10-CM | POA: Diagnosis not present

## 2018-11-22 DIAGNOSIS — I2510 Atherosclerotic heart disease of native coronary artery without angina pectoris: Secondary | ICD-10-CM | POA: Insufficient documentation

## 2018-11-22 DIAGNOSIS — E785 Hyperlipidemia, unspecified: Secondary | ICD-10-CM | POA: Diagnosis not present

## 2018-11-22 DIAGNOSIS — I10 Essential (primary) hypertension: Secondary | ICD-10-CM | POA: Diagnosis not present

## 2018-11-22 DIAGNOSIS — I6523 Occlusion and stenosis of bilateral carotid arteries: Secondary | ICD-10-CM | POA: Diagnosis not present

## 2018-11-22 DIAGNOSIS — R918 Other nonspecific abnormal finding of lung field: Secondary | ICD-10-CM | POA: Diagnosis present

## 2018-11-22 DIAGNOSIS — N182 Chronic kidney disease, stage 2 (mild): Secondary | ICD-10-CM | POA: Diagnosis not present

## 2018-11-22 DIAGNOSIS — I255 Ischemic cardiomyopathy: Secondary | ICD-10-CM | POA: Diagnosis not present

## 2018-11-22 HISTORY — PX: VIDEO BRONCHOSCOPY WITH ENDOBRONCHIAL NAVIGATION: SHX6175

## 2018-11-22 LAB — GLUCOSE, CAPILLARY
Glucose-Capillary: 176 mg/dL — ABNORMAL HIGH (ref 70–99)
Glucose-Capillary: 158 mg/dL — ABNORMAL HIGH (ref 70–99)

## 2018-11-22 SURGERY — VIDEO BRONCHOSCOPY WITH ENDOBRONCHIAL NAVIGATION
Anesthesia: General

## 2018-11-22 MED ORDER — LACTATED RINGERS IV SOLN
INTRAVENOUS | Status: DC | PRN
  Administered 2018-11-22: 11:00:00 via INTRAVENOUS

## 2018-11-22 MED ORDER — FENTANYL CITRATE (PF) 100 MCG/2ML IJ SOLN
INTRAMUSCULAR | Status: DC | PRN
  Administered 2018-11-22: 100 ug via INTRAVENOUS

## 2018-11-22 MED ORDER — OXYCODONE HCL 5 MG PO TABS: 5.0000 mg | ORAL_TABLET | Freq: Once | ORAL | Status: DC | PRN

## 2018-11-22 MED ORDER — ROCURONIUM BROMIDE 10 MG/ML (PF) SYRINGE
PREFILLED_SYRINGE | INTRAVENOUS | Status: AC
  Filled 2018-11-22: qty 10

## 2018-11-22 MED ORDER — LACTATED RINGERS IV SOLN
INTRAVENOUS | Status: DC
  Administered 2018-11-22: 11:00:00 via INTRAVENOUS

## 2018-11-22 MED ORDER — OXYCODONE HCL 5 MG/5ML PO SOLN: 5.0000 mg | Freq: Once | ORAL | Status: DC | PRN

## 2018-11-22 MED ORDER — HYDROMORPHONE HCL 1 MG/ML IJ SOLN: 0.2500 mg | INTRAMUSCULAR | Status: DC | PRN

## 2018-11-22 MED ORDER — LIDOCAINE 2% (20 MG/ML) 5 ML SYRINGE
INTRAMUSCULAR | Status: AC
  Filled 2018-11-22: qty 5

## 2018-11-22 MED ORDER — SODIUM CHLORIDE 0.9 % IV SOLN
INTRAVENOUS | Status: DC | PRN
  Administered 2018-11-22: 13:00:00 60 ug/min via INTRAVENOUS

## 2018-11-22 MED ORDER — PHENYLEPHRINE 40 MCG/ML (10ML) SYRINGE FOR IV PUSH (FOR BLOOD PRESSURE SUPPORT)
PREFILLED_SYRINGE | INTRAVENOUS | Status: AC
  Filled 2018-11-22: qty 10

## 2018-11-22 MED ORDER — ROCURONIUM BROMIDE 50 MG/5ML IV SOSY
PREFILLED_SYRINGE | INTRAVENOUS | Status: DC | PRN
  Administered 2018-11-22: 50 mg via INTRAVENOUS

## 2018-11-22 MED ORDER — PHENYLEPHRINE 40 MCG/ML (10ML) SYRINGE FOR IV PUSH (FOR BLOOD PRESSURE SUPPORT)
PREFILLED_SYRINGE | INTRAVENOUS | Status: DC | PRN
  Administered 2018-11-22: 40 ug via INTRAVENOUS

## 2018-11-22 MED ORDER — SUCCINYLCHOLINE CHLORIDE 200 MG/10ML IV SOSY
PREFILLED_SYRINGE | INTRAVENOUS | Status: DC | PRN
  Administered 2018-11-22: 120 mg via INTRAVENOUS

## 2018-11-22 MED ORDER — DEXAMETHASONE SODIUM PHOSPHATE 10 MG/ML IJ SOLN
INTRAMUSCULAR | Status: AC
  Filled 2018-11-22: qty 1

## 2018-11-22 MED ORDER — PROPOFOL 10 MG/ML IV BOLUS
INTRAVENOUS | Status: DC | PRN
  Administered 2018-11-22: 120 mg via INTRAVENOUS

## 2018-11-22 MED ORDER — PROMETHAZINE HCL 25 MG/ML IJ SOLN: 6.2500 mg | INTRAMUSCULAR | Status: DC | PRN

## 2018-11-22 MED ORDER — SUGAMMADEX SODIUM 200 MG/2ML IV SOLN
INTRAVENOUS | Status: DC | PRN
  Administered 2018-11-22: 300 mg via INTRAVENOUS

## 2018-11-22 MED ORDER — FENTANYL CITRATE (PF) 250 MCG/5ML IJ SOLN
INTRAMUSCULAR | Status: AC
  Filled 2018-11-22: qty 5

## 2018-11-22 MED ORDER — EPINEPHRINE PF 1 MG/ML IJ SOLN
INTRAMUSCULAR | Status: AC
  Filled 2018-11-22: qty 1

## 2018-11-22 MED ORDER — 0.9 % SODIUM CHLORIDE (POUR BTL) OPTIME
TOPICAL | Status: DC | PRN
  Administered 2018-11-22: 1000 mL

## 2018-11-22 MED ORDER — LIDOCAINE 2% (20 MG/ML) 5 ML SYRINGE
INTRAMUSCULAR | Status: DC | PRN
  Administered 2018-11-22: 40 mg via INTRAVENOUS
  Administered 2018-11-22: 60 mg via INTRAVENOUS

## 2018-11-22 MED ORDER — ONDANSETRON HCL 4 MG/2ML IJ SOLN
INTRAMUSCULAR | Status: DC | PRN
  Administered 2018-11-22: 4 mg via INTRAVENOUS

## 2018-11-22 MED ORDER — DEXAMETHASONE SODIUM PHOSPHATE 10 MG/ML IJ SOLN
INTRAMUSCULAR | Status: DC | PRN
  Administered 2018-11-22: 10 mg via INTRAVENOUS

## 2018-11-22 MED ORDER — PROPOFOL 10 MG/ML IV BOLUS
INTRAVENOUS | Status: AC
  Filled 2018-11-22: qty 20

## 2018-11-22 MED ORDER — SUCCINYLCHOLINE CHLORIDE 200 MG/10ML IV SOSY
PREFILLED_SYRINGE | INTRAVENOUS | Status: AC
  Filled 2018-11-22: qty 10

## 2018-11-22 MED ORDER — ONDANSETRON HCL 4 MG/2ML IJ SOLN
INTRAMUSCULAR | Status: AC
  Filled 2018-11-22: qty 2

## 2018-11-22 SURGICAL SUPPLY — 43 items
ADAPTER BRONCHOSCOPE OLYMPUS (ADAPTER) ×2 IMPLANT
ADAPTER VALVE BIOPSY EBUS (MISCELLANEOUS) IMPLANT
ADPTR VALVE BIOPSY EBUS (MISCELLANEOUS)
BRUSH BIOPSY BRONCH 10 SDTNB (MISCELLANEOUS) IMPLANT
BRUSH CYTOLOGY (MISCELLANEOUS) ×1 IMPLANT
BRUSH SUPERTRAX BIOPSY (INSTRUMENTS) ×1 IMPLANT
BRUSH SUPERTRAX NDL-TIP CYTO (INSTRUMENTS) ×2 IMPLANT
CANISTER SUCT 3000ML PPV (MISCELLANEOUS) ×2 IMPLANT
CHANNEL WORK EXTEND EDGE 180 (KITS) IMPLANT
CHANNEL WORK EXTEND EDGE 90 (KITS) IMPLANT
CONT SPEC 4OZ CLIKSEAL STRL BL (MISCELLANEOUS) ×4 IMPLANT
COVER BACK TABLE 60X90IN (DRAPES) ×2 IMPLANT
COVER WAND RF STERILE (DRAPES) ×2 IMPLANT
FILTER STRAW FLUID ASPIR (MISCELLANEOUS) IMPLANT
FORCEPS BIOP SUPERTRX PREMAR (INSTRUMENTS) ×1 IMPLANT
GAUZE SPONGE 4X4 12PLY STRL (GAUZE/BANDAGES/DRESSINGS) ×2 IMPLANT
GLOVE SURG SIGNA 7.5 PF LTX (GLOVE) ×2 IMPLANT
GOWN STRL REUS W/ TWL XL LVL3 (GOWN DISPOSABLE) ×1 IMPLANT
GOWN STRL REUS W/TWL XL LVL3 (GOWN DISPOSABLE) ×1
KIT CLEAN ENDO COMPLIANCE (KITS) ×2 IMPLANT
KIT PROCEDURE EDGE 180 (KITS) ×1 IMPLANT
KIT PROCEDURE EDGE 90 (KITS) IMPLANT
KIT TURNOVER KIT B (KITS) ×2 IMPLANT
MARKER SKIN DUAL TIP RULER LAB (MISCELLANEOUS) ×2 IMPLANT
NDL SUPERTRX PREMARK BIOPSY (NEEDLE) IMPLANT
NEEDLE SUPERTRX PREMARK BIOPSY (NEEDLE) IMPLANT
NS IRRIG 1000ML POUR BTL (IV SOLUTION) ×2 IMPLANT
OIL SILICONE PENTAX (PARTS (SERVICE/REPAIRS)) ×2 IMPLANT
PAD ARMBOARD 7.5X6 YLW CONV (MISCELLANEOUS) ×4 IMPLANT
PATCHES PATIENT (LABEL) ×6 IMPLANT
SYR 20CC LL (SYRINGE) ×3 IMPLANT
SYR 20ML ECCENTRIC (SYRINGE) ×2 IMPLANT
SYR 30ML LL (SYRINGE) ×2 IMPLANT
SYR 5ML LL (SYRINGE) ×2 IMPLANT
TOWEL GREEN STERILE (TOWEL DISPOSABLE) ×2 IMPLANT
TOWEL GREEN STERILE FF (TOWEL DISPOSABLE) ×2 IMPLANT
TRAP SPECIMEN MUCOUS 40CC (MISCELLANEOUS) ×2 IMPLANT
TUBE CONNECTING 20X1/4 (TUBING) ×4 IMPLANT
UNDERPAD 30X30 (UNDERPADS AND DIAPERS) ×2 IMPLANT
VALVE BIOPSY  SINGLE USE (MISCELLANEOUS) ×1
VALVE BIOPSY SINGLE USE (MISCELLANEOUS) ×1 IMPLANT
VALVE SUCTION BRONCHIO DISP (MISCELLANEOUS) ×2 IMPLANT
WATER STERILE IRR 1000ML POUR (IV SOLUTION) ×2 IMPLANT

## 2018-11-22 NOTE — Anesthesia Postprocedure Evaluation (Signed)
Anesthesia Post Note  Patient: [name]  Procedure(s) Performed: VIDEO BRONCHOSCOPY WITH ENDOBRONCHIAL NAVIGATION (N/A )     Patient location during evaluation: PACU Anesthesia Type: General Level of consciousness: awake and alert Pain management: pain level controlled Vital Signs Assessment: post-procedure vital signs reviewed and stable Respiratory status: spontaneous breathing, nonlabored ventilation and respiratory function stable Cardiovascular status: blood pressure returned to baseline and stable Postop Assessment: no apparent nausea or vomiting Anesthetic complications: no    Last Vitals:  Vitals:   11/22/18 1340 11/22/18 1356  BP: (!) 152/65 (!) 142/67  Pulse: 73 70  Resp: 15 19  Temp:    SpO2: 100% 99%    Last Pain:  Vitals:   11/22/18 1340  TempSrc:   PainSc: 0-No pain                 Lynda Rainwater

## 2018-11-22 NOTE — Transfer of Care (Signed)
Immediate Anesthesia Transfer of Care Note  Patient: Mario [name]  Procedure(s) Performed: VIDEO BRONCHOSCOPY WITH ENDOBRONCHIAL NAVIGATION (N/A )  Patient Location: PACU  Anesthesia Type:General  Level of Consciousness: drowsy and patient cooperative  Airway & Oxygen Therapy: Patient Spontanous Breathing and Patient connected to face mask oxygen  Post-op Assessment: Report given to RN and Post -op Vital signs reviewed and stable  Post vital signs: Reviewed and stable  Last Vitals:  Vitals Value Taken Time  BP 162/74 11/22/18 1254  Temp    Pulse 77 11/22/18 1255  Resp 20 11/22/18 1255  SpO2 100 % 11/22/18 1255  Vitals shown include unvalidated device data.  Last Pain:  Vitals:   11/22/18 1108  TempSrc:   PainSc: 0-No pain         Complications: No apparent anesthesia complications

## 2018-11-22 NOTE — Op Note (Signed)
NAME: Mario [name], Mario [name] MEDICAL RECORD [id number] ACCOUNT [id number] DATE OF BIRTH:06-10-1940 FACILITY: [facility] LOCATION: [facility] PHYSICIAN:STEVEN C. HENDRICKSON, MD  OPERATIVE REPORT  DATE OF PROCEDURE:  11/22/2018  PREOPERATIVE DIAGNOSIS:  Left upper lobe mass- suspected T2, N0, nonsmall cell carcinoma.  POSTOPERATIVE DIAGNOSIS:  Nonsmall cell carcinoma, left upper lobe, clinical stage 1B (T2, N0).  PROCEDURE:  Electromagnetic navigational bronchoscopy with brushings and transbronchial biopsies.  SURGEON:  Modesto Charon, MD  ASSISTANT:  None.  ANESTHESIA:  General.  FINDINGS:  Brushings revealed nonsmall-cell carcinoma.  Normal endobronchial anatomy with no endobronchial lesions seen.  CLINICAL NOTE:  [name] is a 78 year old gentleman with a past history of tobacco abuse and COPD.  He quit smoking in 2016.  He had a PET CT in February which showed a spiculated mass in the left upper lobe.  On repeat PET, the left upper lobe mass  had increased in size.  There was no evidence of regional or distant metastases.  He was advised to undergo navigational bronchoscopy for biopsy for diagnostic purposes.  The indications, risks, benefits, and alternatives were discussed in detail with  the patient.  He understood and accepted the risks and agreed to proceed.  OPERATIVE NOTE:  The patient was brought to the operating room on 11/22/2018.  Planning for the navigational bronchoscopy was performed prior to induction. He had induction of general anesthesia and was intubated.  Sequential compression devices were placed on the calves for DVT prophylaxis.  A Bair Hugger device was placed on  the patient to maintain body temperature.  A timeout was performed.  Flexible fiberoptic bronchoscopy was performed via the endotracheal tube.  It revealed normal endobronchial anatomy with no endobronchial lesions to the level of the subsegmental bronchi.  The locatable guide for navigation was   placed and registration was performed.  The bronchoscope then was directed to the left upper lobe bronchus.  The locatable guide was advanced through the appropriate segmental bronchus and was  within 1.5 cm of the center of the lesion and well within the confines of the mass given its large size.  Positioning was confirmed with fluoroscopy, and all sampling was done under fluoroscopy.  Brushings were performed.  This process was repeated 3  times.  While the quick prep was being performed on the brushings, multiple biopsies were obtained.  The locatable guide was reinserted after every 2-3 samplings to ensure continued proximity and alignment with the mass.  The brushings showed  nonsmall-cell carcinoma.  Approximately 10 additional biopsies were obtained.  There was no evidence of pneumothorax on fluoroscopy.  The biopsies were all sent for permanent pathology.  There was some minimal blood in the airway, which cleared easily  with saline.  The total fluoroscopy time was 3 minutes with a dose of 20.49 mg.  The patient was extubated in the operating room and taken to the postanesthetic care unit in good condition.  LN/NUANCE  D:11/22/2018 T:11/22/2018 JOB:[id number]

## 2018-11-22 NOTE — Discharge Instructions (Signed)
Do not drive or engage in heavy physical activity for 24 hours  You may resume normal activities tomorrow  You may cough up small amounts of blood over the next few days  You may use acetaminophen (Tylenol) if needed for discomfort. You may use an over the counter cough medication as needed  Call [phone] if you develop chest pain, shortness of breath or fever > 101F  My office will contact you with follow up information

## 2018-11-22 NOTE — Brief Op Note (Signed)
11/22/2018  1:30 PM  PATIENT:  [name]  78 y.o. male  PRE-OPERATIVE DIAGNOSIS:  LUL MASS- SUSPECTED T2,N0 NON-SMALL CELL CARCINOMA  POST-OPERATIVE DIAGNOSIS: NON-SMALL CELL CARCINOMA, LEFT UPPER LOBE CLINICAL STAGE IB (T2,N0)  PROCEDURE:  Procedure(s): VIDEO BRONCHOSCOPY WITH ENDOBRONCHIAL NAVIGATION (N/A)- brushings and transbronchial biopsies  SURGEON:  Surgeon(s) and Role:    * [name], MD - Primary  PHYSICIAN ASSISTANT:   ASSISTANTS: none   ANESTHESIA:   general  EBL: minimal  BLOOD ADMINISTERED:none  DRAINS: none   LOCAL MEDICATIONS USED:  NONE  SPECIMEN:  Source of Specimen:  LUL mass  DISPOSITION OF SPECIMEN:  PATHOLOGY  COUNTS:  NO endoscopic  TOURNIQUET:  * No tourniquets in log *  DICTATION: .Other Dictation: Dictation Number -  PLAN OF CARE: Discharge to home after PACU  PATIENT DISPOSITION:  PACU - hemodynamically stable.   Delay start of Pharmacological VTE agent (>24hrs) due to surgical blood loss or risk of bleeding: not applicable

## 2018-11-22 NOTE — Anesthesia Procedure Notes (Signed)
Procedure Name: Intubation Date/Time: 11/22/2018 12:09 PM Performed by: Orlie Dakin, CRNA Pre-anesthesia Checklist: Patient identified, Emergency Drugs available, Suction available and Patient being monitored Patient Re-evaluated:Patient Re-evaluated prior to induction Oxygen Delivery Method: Circle system utilized Preoxygenation: Pre-oxygenation with 100% oxygen Induction Type: IV induction and Rapid sequence Laryngoscope Size: Miller and 2 Grade View: Grade I Tube type: Oral Tube size: 8.5 mm Number of attempts: 1 Airway Equipment and Method: Stylet Placement Confirmation: ETT inserted through vocal cords under direct vision,  positive ETCO2 and breath sounds checked- equal and bilateral Secured at: 23 cm Tube secured with: Tape Dental Injury: Teeth and Oropharynx as per pre-operative assessment  Comments: RSI due to Covid 19 pandemic concerns.  DL per Fredderick Erb, CRNA

## 2018-11-22 NOTE — Interval H&P Note (Signed)
History and Physical Interval Note:  11/22/2018 11:07 AM  Mario [name]  has presented today for surgery, with the diagnosis of LUL MASS.  The various methods of treatment have been discussed with the patient and family. After consideration of risks, benefits and other options for treatment, the patient has consented to  Procedure(s): [place] (N/A) as a surgical intervention.  The patient's history has been reviewed, patient examined, no change in status, stable for surgery.  I have reviewed the patient's chart and labs.  Questions were answered to the patient's satisfaction.     [name]

## 2018-11-23 ENCOUNTER — Encounter (HOSPITAL_COMMUNITY): Payer: Self-pay | Admitting: Thoracic Surgery (Cardiothoracic Vascular Surgery)

## 2018-11-26 ENCOUNTER — Encounter (HOSPITAL_COMMUNITY): Payer: PPO

## 2018-11-26 ENCOUNTER — Encounter (HOSPITAL_BASED_OUTPATIENT_CLINIC_OR_DEPARTMENT_OTHER): Payer: PPO | Attending: Internal Medicine

## 2018-11-26 ENCOUNTER — Ambulatory Visit: Payer: PPO | Admitting: Surgery

## 2018-11-26 DIAGNOSIS — Z09 Encounter for follow-up examination after completed treatment for conditions other than malignant neoplasm: Secondary | ICD-10-CM | POA: Insufficient documentation

## 2018-11-26 DIAGNOSIS — E1151 Type 2 diabetes mellitus with diabetic peripheral angiopathy without gangrene: Secondary | ICD-10-CM | POA: Insufficient documentation

## 2018-11-26 DIAGNOSIS — I10 Essential (primary) hypertension: Secondary | ICD-10-CM | POA: Insufficient documentation

## 2018-11-26 DIAGNOSIS — Z8631 Personal history of diabetic foot ulcer: Secondary | ICD-10-CM | POA: Diagnosis not present

## 2018-11-26 DIAGNOSIS — I2510 Atherosclerotic heart disease of native coronary artery without angina pectoris: Secondary | ICD-10-CM | POA: Insufficient documentation

## 2018-11-26 DIAGNOSIS — E1140 Type 2 diabetes mellitus with diabetic neuropathy, unspecified: Secondary | ICD-10-CM | POA: Diagnosis not present

## 2018-11-26 DIAGNOSIS — L97529 Non-pressure chronic ulcer of other part of left foot with unspecified severity: Secondary | ICD-10-CM | POA: Diagnosis not present

## 2018-11-27 ENCOUNTER — Telehealth: Payer: Self-pay | Admitting: Oncology

## 2018-11-27 ENCOUNTER — Encounter: Payer: Self-pay | Admitting: Interventional Cardiology

## 2018-11-27 DIAGNOSIS — C3490 Malignant neoplasm of unspecified part of unspecified bronchus or lung: Secondary | ICD-10-CM | POA: Insufficient documentation

## 2018-11-27 NOTE — Telephone Encounter (Signed)
Faxed records to [facility] [phone]

## 2018-11-28 ENCOUNTER — Ambulatory Visit: Payer: PPO | Admitting: Radiation Oncology

## 2018-11-28 ENCOUNTER — Other Ambulatory Visit: Payer: Self-pay

## 2018-11-28 ENCOUNTER — Ambulatory Visit
Admission: RE | Admit: 2018-11-28 | Discharge: 2018-11-28 | Disposition: A | Discharge status: Home / Self Care | Payer: PPO | Source: Ambulatory Visit | Attending: Radiation Oncology | Admitting: Radiation Oncology

## 2018-11-28 ENCOUNTER — Encounter: Payer: Self-pay | Admitting: Radiation Oncology

## 2018-11-28 DIAGNOSIS — Z510 Encounter for antineoplastic radiation therapy: Secondary | ICD-10-CM | POA: Diagnosis not present

## 2018-11-28 DIAGNOSIS — C3490 Malignant neoplasm of unspecified part of unspecified bronchus or lung: Secondary | ICD-10-CM

## 2018-11-28 DIAGNOSIS — C3412 Malignant neoplasm of upper lobe, left bronchus or lung: Secondary | ICD-10-CM

## 2018-11-28 DIAGNOSIS — Z87891 Personal history of nicotine dependence: Secondary | ICD-10-CM | POA: Diagnosis not present

## 2018-11-28 NOTE — Progress Notes (Signed)
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 Diagnosis TRANSBRONCHIAL NEEDLE ASPIRATION (A) NAVIGATION LEFT UPPER LOBE MASS BRUSHING (SPECIMEN 1 OF 1, COLLECTED ON 11/22/2018): MALIGNANT CELLS CONSISTENT WITH NON-SMALL CELL CARCINOMA.   Tobacco/Marijuana/Snuff/ETOH use: quit smoking in 2016, pt started smoking at age 78.   Pt quit ETOH consumption approximately 15 years ago.  Pt denies illicit drug use.  Past/Anticipated interventions by cardiothoracic surgery, if any: 11/22/18:  PROCEDURE:  Electromagnetic navigational bronchoscopy with brushings and transbronchial biopsies.  SURGEON:  Modesto Charon, MD  Past/Anticipated interventions by medical oncology, if any:   Signs/Symptoms Weight changes, if any:  Wt Readings from Last 3 Encounters:  11/28/18 161 lb (73 kg)  11/22/18 164 lb 4 oz (74.5 kg)  11/19/18 164 lb 4 oz (74.5 kg)       Respiratory complaints, if any: Pt is c/o difficulty breathing with mask and is observed taking deep breaths through mouth. Pt frequently rearranges mask and will have nose out of mask.  He denies any respiratory complaints without any cough or shortness of breath. He denies any dyspnea on exertion. Denies any fevers, chills or sweats. Denied chest pain, palpitation, orthopnea or leg edema. Denied cough, wheezing or hemoptysis.   Hemoptysis, if any: Pt reports slight hemoptysis following bronchoscopy has resolved.  Pain issues, if any:  Pt reports intermittent pain in LEFT second toe due to "infection in the bone" Pt denies any c/o pain today.  SAFETY ISSUES:  Prior radiation? No  Pacemaker/ICD? No  Possible current pregnancy? N/A  Is the patient on methotrexate? No  Current Complaints / other details:  Pt present today for f/u new with Dr. Sondra Come.   BP (!) 143/63 (BP Location: Left Arm, Patient Position: Sitting)    Pulse 82   Temp 98.9 F (37.2 C) (Temporal)   Resp 16   Ht 6\' 2"  (1.88 m)   Wt 161 lb (73 kg)   SpO2 100%   BMI 20.67 kg/m   Loma Sousa, RN BSN

## 2018-11-28 NOTE — Patient Instructions (Signed)
Coronavirus (COVID-19) Are you at risk?  Are you at risk for the Coronavirus (COVID-19)?  To be considered HIGH RISK for Coronavirus (COVID-19), you have to meet the following criteria:  . Traveled to [place], [place], [place], [place] or [place]; or in the [place] to [place], [place], [place], or [place]; and have fever, cough, and shortness of breath within the last 2 weeks of travel OR . Been in close contact with a person diagnosed with COVID-19 within the last 2 weeks and have fever, cough, and shortness of breath . IF YOU DO NOT MEET THESE CRITERIA, YOU ARE CONSIDERED LOW RISK FOR COVID-19.  What to do if you are HIGH RISK for COVID-19?  . If you are having a medical emergency, call 911. . Seek medical care right away. Before you go to a doctor's office, urgent care or emergency department, call ahead and tell them about your recent travel, contact with someone diagnosed with COVID-19, and your symptoms. You should receive instructions from your physician's office regarding next steps of care.  . When you arrive at healthcare provider, tell the healthcare staff immediately you have returned from visiting [place], [place], [place], [place] or [place]; or traveled in the [place] to [place], [place], [place], or [place]; in the last two weeks or you have been in close contact with a person diagnosed with COVID-19 in the last 2 weeks.   . Tell the health care staff about your symptoms: fever, cough and shortness of breath. . After you have been seen by a medical provider, you will be either: o Tested for (COVID-19) and discharged home on quarantine except to seek medical care if symptoms worsen, and asked to  - Stay home and avoid contact with others until you get your results (4-5 days)  - Avoid travel on public transportation if possible (such as bus, train, or airplane) or o Sent to the Emergency Department by EMS for evaluation, COVID-19 testing, and possible  admission depending on your condition and test results.  What to do if you are LOW RISK for COVID-19?  Reduce your risk of any infection by using the same precautions used for avoiding the common cold or flu:  . Wash your hands often with soap and warm water for at least 20 seconds.  If soap and water are not readily available, use an alcohol-based hand sanitizer with at least 60% alcohol.  . If coughing or sneezing, cover your mouth and nose by coughing or sneezing into the elbow areas of your shirt or coat, into a tissue or into your sleeve (not your hands). . Avoid shaking hands with others and consider head nods or verbal greetings only. . Avoid touching your eyes, nose, or mouth with unwashed hands.  . Avoid close contact with people who are sick. . Avoid places or events with large numbers of people in one location, like concerts or sporting events. . Carefully consider travel plans you have or are making. . If you are planning any travel outside or inside the [place], visit the CDC's Travelers' Health webpage for the latest health notices. . If you have some symptoms but not all symptoms, continue to monitor at home and seek medical attention if your symptoms worsen. . If you are having a medical emergency, call 911.   ADDITIONAL HEALTHCARE OPTIONS FOR PATIENTS  [facility] / e-Visit: [email or web address]         [facility]: [phone]  [place]   [facility]: [phone]                   [facility]: [phone]   

## 2018-12-03 ENCOUNTER — Encounter (HOSPITAL_COMMUNITY): Payer: PPO

## 2018-12-03 ENCOUNTER — Ambulatory Visit: Payer: PPO | Admitting: Surgery

## 2018-12-03 DIAGNOSIS — C3412 Malignant neoplasm of upper lobe, left bronchus or lung: Secondary | ICD-10-CM | POA: Diagnosis not present

## 2018-12-03 DIAGNOSIS — Z510 Encounter for antineoplastic radiation therapy: Secondary | ICD-10-CM | POA: Diagnosis not present

## 2018-12-05 ENCOUNTER — Ambulatory Visit
Admission: RE | Admit: 2018-12-05 | Discharge: 2018-12-05 | Disposition: A | Discharge status: Home / Self Care | Payer: PPO | Source: Ambulatory Visit | Attending: Radiation Oncology | Admitting: Radiation Oncology

## 2018-12-05 DIAGNOSIS — Z510 Encounter for antineoplastic radiation therapy: Secondary | ICD-10-CM | POA: Diagnosis not present

## 2018-12-05 DIAGNOSIS — C3412 Malignant neoplasm of upper lobe, left bronchus or lung: Secondary | ICD-10-CM

## 2018-12-05 NOTE — Progress Notes (Signed)
  Radiation Oncology         [phone] ________________________________  Name: Mario [name] MRN: [id number]  Date: 12/05/2018  DOB: 05/07/1941  Stereotactic Body Radiotherapy Treatment Procedure Note  NARRATIVE:  Mario [name] was brought to the stereotactic radiation treatment machine and placed supine on the CT couch. The patient was set up for stereotactic body radiotherapy on the body fix pillow.  3D TREATMENT PLANNING AND DOSIMETRY:  The patient's radiation plan was reviewed and approved prior to starting treatment.  It showed 3-dimensional radiation distributions overlaid onto the planning CT.  The [facility] for the target structures as well as the organs at risk were reviewed. The documentation of this is filed in the radiation oncology EMR.  SIMULATION VERIFICATION:  The patient underwent CT imaging on the treatment unit.  These were carefully aligned to document that the ablative radiation dose would cover the target volume and maximally spare the nearby organs at risk according to the planned distribution.  SPECIAL TREATMENT PROCEDURE: Mario [name] received high dose ablative stereotactic body radiotherapy to the planned target volume without unforeseen complications. Treatment was delivered uneventfully. The high doses associated with stereotactic body radiotherapy and the significant potential risks require careful treatment set up and patient monitoring constituting a special treatment procedure   STEREOTACTIC TREATMENT MANAGEMENT:  Following delivery, the patient was evaluated clinically. The patient tolerated treatment without significant acute effects, and was discharged to home in stable condition.    PLAN: Continue treatment as planned.  ________________________________  Blair Promise, PhD, MD   This document serves as a record of services personally performed by [name], MD. It was created on his behalf by Mary-Margaret Loma Messing, a trained medical scribe. The  creation of this record is based on the scribe's personal observations and the provider's statements to them. This document has been checked and approved by the attending provider.

## 2018-12-07 ENCOUNTER — Other Ambulatory Visit: Payer: Self-pay

## 2018-12-07 ENCOUNTER — Ambulatory Visit
Admission: RE | Admit: 2018-12-07 | Discharge: 2018-12-07 | Disposition: A | Discharge status: Home / Self Care | Payer: PPO | Source: Ambulatory Visit | Attending: Radiation Oncology | Admitting: Radiation Oncology

## 2018-12-07 DIAGNOSIS — Z510 Encounter for antineoplastic radiation therapy: Secondary | ICD-10-CM | POA: Diagnosis not present

## 2018-12-10 ENCOUNTER — Ambulatory Visit
Admission: RE | Admit: 2018-12-10 | Discharge: 2018-12-10 | Disposition: A | Discharge status: Home / Self Care | Payer: PPO | Source: Ambulatory Visit | Attending: Radiation Oncology | Admitting: Radiation Oncology

## 2018-12-10 ENCOUNTER — Other Ambulatory Visit: Payer: Self-pay

## 2018-12-10 ENCOUNTER — Encounter: Payer: Self-pay | Admitting: Radiation Oncology

## 2018-12-10 DIAGNOSIS — Z510 Encounter for antineoplastic radiation therapy: Secondary | ICD-10-CM | POA: Diagnosis not present

## 2018-12-10 DIAGNOSIS — C3412 Malignant neoplasm of upper lobe, left bronchus or lung: Secondary | ICD-10-CM

## 2018-12-10 NOTE — Progress Notes (Signed)
  Radiation Oncology         [phone]) [phone] ________________________________  Name: Mario [name] MRN: [id number]  Date: 12/10/2018  DOB: 05-19-1941  Stereotactic Body Radiotherapy Treatment Procedure Note  NARRATIVE:  Mario [name] was brought to the stereotactic radiation treatment machine and placed supine on the CT couch. The patient was set up for stereotactic body radiotherapy on the body fix pillow.  3D TREATMENT PLANNING AND DOSIMETRY:  The patient's radiation plan was reviewed and approved prior to starting treatment.  It showed 3-dimensional radiation distributions overlaid onto the planning CT.  The [facility] for the target structures as well as the organs at risk were reviewed. The documentation of this is filed in the radiation oncology EMR.  SIMULATION VERIFICATION:  The patient underwent CT imaging on the treatment unit.  These were carefully aligned to document that the ablative radiation dose would cover the target volume and maximally spare the nearby organs at risk according to the planned distribution.  SPECIAL TREATMENT PROCEDURE: Mario [name] received high dose ablative stereotactic body radiotherapy to the planned target volume without unforeseen complications. Treatment was delivered uneventfully. The high doses associated with stereotactic body radiotherapy and the significant potential risks require careful treatment set up and patient monitoring constituting a special treatment procedure   STEREOTACTIC TREATMENT MANAGEMENT:  Following delivery, the patient was evaluated clinically. The patient tolerated treatment without significant acute effects, and was discharged to home in stable condition.    PLAN: Continue treatment as planned.  ________________________________  Blair Promise, PhD, MD   This document serves as a record of services personally performed by [name], MD. It was created on his behalf by Mary-Margaret Loma Messing, a trained medical scribe. The  creation of this record is based on the scribe's personal observations and the provider's statements to them. This document has been checked and approved by the attending provider.

## 2018-12-12 ENCOUNTER — Ambulatory Visit
Admission: RE | Admit: 2018-12-12 | Discharge: 2018-12-12 | Disposition: A | Discharge status: Home / Self Care | Payer: PPO | Source: Ambulatory Visit | Attending: Radiation Oncology | Admitting: Radiation Oncology

## 2018-12-12 ENCOUNTER — Other Ambulatory Visit: Payer: Self-pay

## 2018-12-12 DIAGNOSIS — C3412 Malignant neoplasm of upper lobe, left bronchus or lung: Secondary | ICD-10-CM

## 2018-12-12 DIAGNOSIS — Z510 Encounter for antineoplastic radiation therapy: Secondary | ICD-10-CM | POA: Diagnosis not present

## 2018-12-12 NOTE — Progress Notes (Signed)
  Radiation Oncology         [phone]) [phone] ________________________________  Name: Mario [name] MRN: [id number]  Date: 12/12/2018  DOB: 17-Feb-1941  Stereotactic Body Radiotherapy Treatment Procedure Note  NARRATIVE:  Mario [name] was brought to the stereotactic radiation treatment machine and placed supine on the CT couch. The patient was set up for stereotactic body radiotherapy on the body fix pillow.  3D TREATMENT PLANNING AND DOSIMETRY:  The patient's radiation plan was reviewed and approved prior to starting treatment.  It showed 3-dimensional radiation distributions overlaid onto the planning CT.  The [facility] for the target structures as well as the organs at risk were reviewed. The documentation of this is filed in the radiation oncology EMR.  SIMULATION VERIFICATION:  The patient underwent CT imaging on the treatment unit.  These were carefully aligned to document that the ablative radiation dose would cover the target volume and maximally spare the nearby organs at risk according to the planned distribution.  SPECIAL TREATMENT PROCEDURE: Mario [name] received high dose ablative stereotactic body radiotherapy to the planned target volume without unforeseen complications. Treatment was delivered uneventfully. The high doses associated with stereotactic body radiotherapy and the significant potential risks require careful treatment set up and patient monitoring constituting a special treatment procedure   STEREOTACTIC TREATMENT MANAGEMENT:  Following delivery, the patient was evaluated clinically. The patient tolerated treatment without significant acute effects, and was discharged to home in stable condition.    PLAN: Continue treatment as planned.  ________________________________  Blair Promise, PhD, MD   This document serves as a record of services personally performed by [name], MD. It was created on his behalf by Wilburn Mylar, a trained medical scribe. The  creation of this record is based on the scribe's personal observations and the provider's statements to them. This document has been checked and approved by the attending provider.

## 2018-12-13 DIAGNOSIS — E1122 Type 2 diabetes mellitus with diabetic chronic kidney disease: Secondary | ICD-10-CM | POA: Diagnosis not present

## 2018-12-13 DIAGNOSIS — E7800 Pure hypercholesterolemia, unspecified: Secondary | ICD-10-CM | POA: Diagnosis not present

## 2018-12-13 DIAGNOSIS — I2510 Atherosclerotic heart disease of native coronary artery without angina pectoris: Secondary | ICD-10-CM | POA: Diagnosis not present

## 2018-12-13 DIAGNOSIS — N401 Enlarged prostate with lower urinary tract symptoms: Secondary | ICD-10-CM | POA: Diagnosis not present

## 2018-12-13 DIAGNOSIS — E1151 Type 2 diabetes mellitus with diabetic peripheral angiopathy without gangrene: Secondary | ICD-10-CM | POA: Diagnosis not present

## 2018-12-13 DIAGNOSIS — E11319 Type 2 diabetes mellitus with unspecified diabetic retinopathy without macular edema: Secondary | ICD-10-CM | POA: Diagnosis not present

## 2018-12-13 DIAGNOSIS — I10 Essential (primary) hypertension: Secondary | ICD-10-CM | POA: Diagnosis not present

## 2018-12-13 DIAGNOSIS — E782 Mixed hyperlipidemia: Secondary | ICD-10-CM | POA: Diagnosis not present

## 2018-12-13 DIAGNOSIS — E7849 Other hyperlipidemia: Secondary | ICD-10-CM | POA: Diagnosis not present

## 2018-12-13 DIAGNOSIS — N183 Chronic kidney disease, stage 3 (moderate): Secondary | ICD-10-CM | POA: Diagnosis not present

## 2018-12-14 ENCOUNTER — Ambulatory Visit
Admission: RE | Admit: 2018-12-14 | Discharge: 2018-12-14 | Disposition: A | Discharge status: Home / Self Care | Payer: PPO | Source: Ambulatory Visit | Attending: Radiation Oncology | Admitting: Radiation Oncology

## 2018-12-14 ENCOUNTER — Encounter: Payer: Self-pay | Admitting: Radiation Oncology

## 2018-12-14 ENCOUNTER — Other Ambulatory Visit: Payer: Self-pay

## 2018-12-14 DIAGNOSIS — C3412 Malignant neoplasm of upper lobe, left bronchus or lung: Secondary | ICD-10-CM | POA: Diagnosis not present

## 2018-12-14 DIAGNOSIS — Z510 Encounter for antineoplastic radiation therapy: Secondary | ICD-10-CM | POA: Diagnosis not present

## 2018-12-21 ENCOUNTER — Other Ambulatory Visit: Payer: Self-pay | Admitting: Endocrinology

## 2018-12-22 DIAGNOSIS — C44722 Squamous cell carcinoma of skin of right lower limb, including hip: Secondary | ICD-10-CM | POA: Diagnosis not present

## 2019-01-03 DIAGNOSIS — C44719 Basal cell carcinoma of skin of left lower limb, including hip: Secondary | ICD-10-CM | POA: Diagnosis not present

## 2019-01-03 DIAGNOSIS — D0462 Carcinoma in situ of skin of left upper limb, including shoulder: Secondary | ICD-10-CM | POA: Diagnosis not present

## 2019-01-03 DIAGNOSIS — D0461 Carcinoma in situ of skin of right upper limb, including shoulder: Secondary | ICD-10-CM | POA: Diagnosis not present

## 2019-01-03 DIAGNOSIS — C44712 Basal cell carcinoma of skin of right lower limb, including hip: Secondary | ICD-10-CM | POA: Diagnosis not present

## 2019-01-04 DIAGNOSIS — I2510 Atherosclerotic heart disease of native coronary artery without angina pectoris: Secondary | ICD-10-CM | POA: Diagnosis not present

## 2019-01-04 DIAGNOSIS — I739 Peripheral vascular disease, unspecified: Secondary | ICD-10-CM | POA: Diagnosis not present

## 2019-01-04 DIAGNOSIS — R918 Other nonspecific abnormal finding of lung field: Secondary | ICD-10-CM | POA: Diagnosis not present

## 2019-01-04 DIAGNOSIS — R590 Localized enlarged lymph nodes: Secondary | ICD-10-CM | POA: Diagnosis not present

## 2019-01-04 DIAGNOSIS — Z951 Presence of aortocoronary bypass graft: Secondary | ICD-10-CM | POA: Diagnosis not present

## 2019-01-04 DIAGNOSIS — E1122 Type 2 diabetes mellitus with diabetic chronic kidney disease: Secondary | ICD-10-CM | POA: Diagnosis not present

## 2019-01-04 DIAGNOSIS — I129 Hypertensive chronic kidney disease with stage 1 through stage 4 chronic kidney disease, or unspecified chronic kidney disease: Secondary | ICD-10-CM | POA: Diagnosis not present

## 2019-01-04 DIAGNOSIS — I10 Essential (primary) hypertension: Secondary | ICD-10-CM | POA: Diagnosis not present

## 2019-01-04 DIAGNOSIS — C44701 Unspecified malignant neoplasm of skin of unspecified lower limb, including hip: Secondary | ICD-10-CM | POA: Diagnosis not present

## 2019-01-17 ENCOUNTER — Encounter: Payer: Self-pay | Admitting: Radiation Oncology

## 2019-01-17 ENCOUNTER — Ambulatory Visit
Admission: RE | Admit: 2019-01-17 | Discharge: 2019-01-17 | Disposition: A | Discharge status: Home / Self Care | Payer: PPO | Source: Ambulatory Visit | Attending: Radiation Oncology | Admitting: Radiation Oncology

## 2019-01-17 ENCOUNTER — Other Ambulatory Visit: Payer: Self-pay

## 2019-01-17 VITALS — BP 140/70 | HR 88 | Temp 98.7°F | Resp 18 | Ht 74.0 in | Wt 160.5 lb

## 2019-01-17 DIAGNOSIS — E1122 Type 2 diabetes mellitus with diabetic chronic kidney disease: Secondary | ICD-10-CM | POA: Diagnosis not present

## 2019-01-17 DIAGNOSIS — Z79899 Other long term (current) drug therapy: Secondary | ICD-10-CM | POA: Diagnosis not present

## 2019-01-17 DIAGNOSIS — C3412 Malignant neoplasm of upper lobe, left bronchus or lung: Secondary | ICD-10-CM | POA: Diagnosis present

## 2019-01-17 DIAGNOSIS — E782 Mixed hyperlipidemia: Secondary | ICD-10-CM | POA: Diagnosis not present

## 2019-01-17 DIAGNOSIS — R252 Cramp and spasm: Secondary | ICD-10-CM | POA: Insufficient documentation

## 2019-01-17 DIAGNOSIS — Z7984 Long term (current) use of oral hypoglycemic drugs: Secondary | ICD-10-CM | POA: Insufficient documentation

## 2019-01-17 DIAGNOSIS — E7849 Other hyperlipidemia: Secondary | ICD-10-CM | POA: Diagnosis not present

## 2019-01-17 DIAGNOSIS — Z7982 Long term (current) use of aspirin: Secondary | ICD-10-CM | POA: Insufficient documentation

## 2019-01-17 DIAGNOSIS — N183 Chronic kidney disease, stage 3 (moderate): Secondary | ICD-10-CM | POA: Diagnosis not present

## 2019-01-17 DIAGNOSIS — Z923 Personal history of irradiation: Secondary | ICD-10-CM | POA: Diagnosis not present

## 2019-01-17 DIAGNOSIS — N401 Enlarged prostate with lower urinary tract symptoms: Secondary | ICD-10-CM | POA: Diagnosis not present

## 2019-01-17 DIAGNOSIS — E7800 Pure hypercholesterolemia, unspecified: Secondary | ICD-10-CM | POA: Diagnosis not present

## 2019-01-17 DIAGNOSIS — J449 Chronic obstructive pulmonary disease, unspecified: Secondary | ICD-10-CM | POA: Insufficient documentation

## 2019-01-17 DIAGNOSIS — I10 Essential (primary) hypertension: Secondary | ICD-10-CM | POA: Diagnosis not present

## 2019-01-17 DIAGNOSIS — C3490 Malignant neoplasm of unspecified part of unspecified bronchus or lung: Secondary | ICD-10-CM

## 2019-01-17 DIAGNOSIS — E1151 Type 2 diabetes mellitus with diabetic peripheral angiopathy without gangrene: Secondary | ICD-10-CM | POA: Diagnosis not present

## 2019-01-17 DIAGNOSIS — I2510 Atherosclerotic heart disease of native coronary artery without angina pectoris: Secondary | ICD-10-CM | POA: Diagnosis not present

## 2019-01-17 NOTE — Progress Notes (Signed)
Pt presents today for f/u with Dr. Sondra Come. Pt denies pain in chest, reports leg "cramps" attributed to poor circulation. Pt reports frequent cough with white sputum. Pt denies hemoptysis. Pt denies difficulty swallowing. Pt reports SOB at times but attributes to diagnosis of COPD.   BP 140/70 (BP Location: Left Arm, Patient Position: Sitting)   Pulse 88   Temp 98.7 F (37.1 C) (Temporal)   Resp 18   Ht 6\' 2"  (1.88 m)   Wt 160 lb 8 oz (72.8 kg)   SpO2 100%   BMI 20.61 kg/m   Wt Readings from Last 3 Encounters:  01/17/19 160 lb 8 oz (72.8 kg)  11/28/18 161 lb (73 kg)  11/22/18 164 lb 4 oz (74.5 kg)   Loma Sousa, RN BSN

## 2019-01-17 NOTE — Progress Notes (Signed)
Radiation Oncology         [phone]) [phone] ________________________________  Name: Mario [name] MRN: [id number]  Date: 01/17/2019  DOB: 1940-08-01  Follow-Up Visit Note  CC: [name], MD  [name], *    ICD-10-CM   1. Non-small cell carcinoma of lung [facility])  C34.90     Diagnosis:  PresumptiveStage IIb (T3a, N0, M0)non-small cell lung cancer presenting in the leftupper lung area  Interval Since Last Radiation:  1 month 12/05/2018 - 12/14/2018: LUL / 60 Gy in 5 fractions (SBRT)  Narrative:  The patient returns today for routine follow-up. His interval history is generally unremarkable.  On review of systems, he reports frequent productive cough with white sputum. He denies chest pain, hemoptysis, and difficulty swallowing. He also notes leg cramps associated with poor circulation and shortness of breath associated with COPD.  ALLERGIES:  is allergic to penicillins; plavix [clopidogrel bisulfate]; and pletal [cilostazol].  Meds: Current Outpatient Medications  Medication Sig Dispense Refill  . allopurinol (ZYLOPRIM) 100 MG tablet Take 1 tablet by mouth daily (Patient taking differently: Take 100 mg by mouth at bedtime. ) 90 tablet 0  . ARTIFICIAL TEAR OP Apply 1 drop to eye daily as needed (dry eyes).    Marland Kitchen aspirin EC 81 MG tablet Take 81 mg by mouth daily.    Marland Kitchen atorvastatin (LIPITOR) 40 MG tablet take 1 tablet by mouth daily at 6:00pm  (Patient taking differently: Take 40 mg by mouth at bedtime. ) 90 tablet 0  . Blood Glucose Monitoring Suppl (FREESTYLE LITE) DEVI USE TO TEST BLOOD SUGAR TWICE DAILY 1 each 1  . calcium carbonate (TUMS - DOSED IN MG ELEMENTAL CALCIUM) 500 MG chewable tablet Chew 1 tablet by mouth daily as needed for indigestion or heartburn.    . carvedilol (COREG) 3.125 MG tablet TAKE 1 TABLET BY MOUTH TWICE A DAY (Patient taking differently: Take 3.125 mg by mouth 2 (two) times daily with a meal. ) 180 tablet 3  . Cholecalciferol (VITAMIN D)  2000 units CAPS Take 2,000 Units by mouth daily.    . Coenzyme Q10 (COQ10) 200 MG CAPS Take 200 mg by mouth daily.    . fenofibrate micronized (ANTARA) 43 MG capsule Take 43 mg by mouth every evening.    . ferrous sulfate 325 (65 FE) MG tablet Take 325 mg by mouth daily.    . finasteride (PROSCAR) 5 MG tablet Take 5 mg by mouth every evening.   3  . folic acid (FOLVITE) 790 MCG tablet Take 800 mcg by mouth daily.     Marland Kitchen glipiZIDE (GLUCOTROL XL) 10 MG 24 hr tablet TAKE 1 TABLET BY MOUTH AT BEDTIME FOR DIABETES. (Patient taking differently: Take 10 mg by mouth at bedtime. ) 30 tablet 2  . glucose blood (FREESTYLE LITE) test strip Use as instructed to check blood sugar 2 times per day dx code E11.65 100 each 3  . Lancets (FREESTYLE) lancets Use as instructed to check blood sugar 2 times per day dx code E11.65 100 each 3  . lisinopril (ZESTRIL) 10 MG tablet Take 1 tablet (10 mg total) by mouth daily. 90 tablet 1  . metFORMIN (GLUCOPHAGE) 1000 MG tablet TAKE 1 TABLET BY MOUTH TWICE DAILY FOR BREAKFAST AND DINNER FOR DIABETES. (Patient taking differently: Take 1,000 mg by mouth 2 (two) times daily with a meal. ) 60 tablet 2  . miglitol (GLYSET) 50 MG tablet TAKE 1 TABLET BY MOUTH TWICE DAILY AT BREAKFAST AND DINNER FOR DIABETES. (  Patient taking differently: Take 50 mg by mouth 2 (two) times daily. ) 60 tablet 2  . multivitamin-lutein (OCUVITE-LUTEIN) CAPS capsule Take 1 capsule by mouth 2 (two) times daily.    Marland Kitchen oxyCODONE (ROXICODONE) 5 MG immediate release tablet Take 1 tablet (5 mg total) by mouth every 6 (six) hours as needed. 30 tablet 0  . tamsulosin (FLOMAX) 0.4 MG CAPS capsule Take 0.4 mg by mouth 2 (two) times daily.    Marland Kitchen VICTOZA 18 MG/3ML SOPN INJECT 1.8MG  INTO THE SKIN DAILY AT DINNERTIME FOR DIABETES. 9 mL 3  . vitamin B-12 (CYANOCOBALAMIN) 1000 MCG tablet Take 1,000 mcg by mouth daily.     No current facility-administered medications for this encounter.     Physical Findings: The patient  is in no acute distress. Patient is alert and oriented.  height is 6\' 2"  (1.88 m) and weight is 160 lb 8 oz (72.8 kg). His temporal temperature is 98.7 F (37.1 C). His blood pressure is 140/70 and his pulse is 88. His respiration is 18 and oxygen saturation is 100%. .  No significant changes. Lungs are clear to auscultation bilaterally. Heart has regular rate and rhythm. No palpable cervical, supraclavicular, or axillary adenopathy. Abdomen soft, non-tender, normal bowel sounds.  Lab Findings: Lab Results  Component Value Date   WBC 7.1 11/19/2018   HGB 9.8 (L) 11/19/2018   HCT 32.7 (L) 11/19/2018   MCV 90.6 11/19/2018   PLT 200 11/19/2018    Radiographic Findings: No results found.  Impression:  PresumptiveStage IIb (T3a, N0, M0)non-small cell lung cancer presenting in the leftupper lung area  Patient tolerated his SBRT well. He reports no ongoing side effects.  Plan:  Patient will return in 3 months and have chest CT at that time for follow up of his SBRT treatment.  ____________________________________ [name], MD   This document serves as a record of services personally performed by [name], MD. It was created on his behalf by Wilburn Mylar, a trained medical scribe. The creation of this record is based on the scribe's personal observations and the provider's statements to them. This document has been checked and approved by the attending provider.

## 2019-01-17 NOTE — Progress Notes (Signed)
  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 He reported mild fatigue and productive cough with clear phlegm. He denied pain, shortness of breath, difficulty swallowing, skin issues, and change in appetite.  Plan: The patient will follow-up with radiation oncology in 1 month.  ________________________________________________ -----------------------------------  Blair Promise, PhD, MD  This document serves as a record of services personally performed by [name], MD. It was created on his behalf by Wilburn Mylar, a trained medical scribe. The creation of this record is based on the scribe's personal observations and the provider's statements to them. This document has been checked and approved by the attending provider.

## 2019-01-17 NOTE — Patient Instructions (Signed)
Coronavirus (COVID-19) Are you at risk?  Are you at risk for the Coronavirus (COVID-19)?  To be considered HIGH RISK for Coronavirus (COVID-19), you have to meet the following criteria:  . Traveled to [place], [place], [place], [place] or [place]; or in the [place] to [place], [place], [place], or [place]; and have fever, cough, and shortness of breath within the last 2 weeks of travel OR . Been in close contact with a person diagnosed with COVID-19 within the last 2 weeks and have fever, cough, and shortness of breath . IF YOU DO NOT MEET THESE CRITERIA, YOU ARE CONSIDERED LOW RISK FOR COVID-19.  What to do if you are HIGH RISK for COVID-19?  . If you are having a medical emergency, call 911. . Seek medical care right away. Before you go to a doctor's office, urgent care or emergency department, call ahead and tell them about your recent travel, contact with someone diagnosed with COVID-19, and your symptoms. You should receive instructions from your physician's office regarding next steps of care.  . When you arrive at healthcare provider, tell the healthcare staff immediately you have returned from visiting [place], [place], [place], [place] or [place]; or traveled in the [place] to [place], [place], [place], or [place]; in the last two weeks or you have been in close contact with a person diagnosed with COVID-19 in the last 2 weeks.   . Tell the health care staff about your symptoms: fever, cough and shortness of breath. . After you have been seen by a medical provider, you will be either: o Tested for (COVID-19) and discharged home on quarantine except to seek medical care if symptoms worsen, and asked to  - Stay home and avoid contact with others until you get your results (4-5 days)  - Avoid travel on public transportation if possible (such as bus, train, or airplane) or o Sent to the Emergency Department by EMS for evaluation, COVID-19 testing, and possible  admission depending on your condition and test results.  What to do if you are LOW RISK for COVID-19?  Reduce your risk of any infection by using the same precautions used for avoiding the common cold or flu:  . Wash your hands often with soap and warm water for at least 20 seconds.  If soap and water are not readily available, use an alcohol-based hand sanitizer with at least 60% alcohol.  . If coughing or sneezing, cover your mouth and nose by coughing or sneezing into the elbow areas of your shirt or coat, into a tissue or into your sleeve (not your hands). . Avoid shaking hands with others and consider head nods or verbal greetings only. . Avoid touching your eyes, nose, or mouth with unwashed hands.  . Avoid close contact with people who are sick. . Avoid places or events with large numbers of people in one location, like concerts or sporting events. . Carefully consider travel plans you have or are making. . If you are planning any travel outside or inside the [place], visit the CDC's Travelers' Health webpage for the latest health notices. . If you have some symptoms but not all symptoms, continue to monitor at home and seek medical attention if your symptoms worsen. . If you are having a medical emergency, call 911.   ADDITIONAL HEALTHCARE OPTIONS FOR PATIENTS  [facility] / e-Visit: [email or web address]         [facility]: [phone]  [facility]: [phone]                   [facility]: [phone]   

## 2019-01-22 DIAGNOSIS — C44722 Squamous cell carcinoma of skin of right lower limb, including hip: Secondary | ICD-10-CM | POA: Diagnosis not present

## 2019-01-29 NOTE — Progress Notes (Signed)
  Radiation Oncology         [phone] ________________________________  Name: Mario [name] MRN: [id number]  Date: 11/28/2018  DOB: April 08, 1941   STEREOTACTIC BODY RADIOTHERAPY SIMULATION AND TREATMENT PLANNING NOTE    DIAGNOSIS:  Presumptive Stage IIb (T3a, N0, M0) non-small cell lung cancer presenting in the left upper lung area  NARRATIVE:  The patient was brought to the [place] suite.  Identity was confirmed.  All relevant records and images related to the planned course of therapy were reviewed.  The patient freely provided informed written consent to proceed with treatment after reviewing the details related to the planned course of therapy. The consent form was witnessed and verified by the simulation staff.  Then, the patient was set-up in a stable reproducible  supine position for radiation therapy.  A BodyFix immobilization pillow was fabricated for reproducible positioning.  Then I personally applied the abdominal compression paddle to limit respiratory excursion.  4D respiratoy motion management CT images were obtained.  Surface markings were placed.  The CT images were loaded into the planning software.  Then, using Cine, MIP, and standard views, the internal target volume (ITV) and planning target volumes (PTV) were delinieated, and avoidance structures were contoured.  Treatment planning then occurred.  The radiation prescription was entered and confirmed.  A total of two complex treatment devices were fabricated in the form of the BodyFix immobilization pillow and a neck accuform cushion.  I have requested : 3D Simulation  I have requested a DVH of the following structures: Heart, Lungs, Esophagus, Chest Wall, Brachial Plexus, Major Blood Vessels, and targets.  PLAN:  The patient will receive 60 Gy in 5 fractions.  -----------------------------------  Blair Promise, PhD, MD

## 2019-01-31 ENCOUNTER — Other Ambulatory Visit: Payer: Self-pay | Admitting: Dermatology

## 2019-01-31 DIAGNOSIS — C44612 Basal cell carcinoma of skin of right upper limb, including shoulder: Secondary | ICD-10-CM | POA: Diagnosis not present

## 2019-01-31 DIAGNOSIS — D0461 Carcinoma in situ of skin of right upper limb, including shoulder: Secondary | ICD-10-CM | POA: Diagnosis not present

## 2019-01-31 DIAGNOSIS — C44519 Basal cell carcinoma of skin of other part of trunk: Secondary | ICD-10-CM | POA: Diagnosis not present

## 2019-02-11 ENCOUNTER — Other Ambulatory Visit (INDEPENDENT_AMBULATORY_CARE_PROVIDER_SITE_OTHER): Payer: HMO

## 2019-02-11 ENCOUNTER — Other Ambulatory Visit: Payer: Self-pay

## 2019-02-11 DIAGNOSIS — E1151 Type 2 diabetes mellitus with diabetic peripheral angiopathy without gangrene: Secondary | ICD-10-CM | POA: Diagnosis not present

## 2019-02-11 LAB — BASIC METABOLIC PANEL
BUN: 25 mg/dL — ABNORMAL HIGH (ref 6–23)
CO2: 22 mEq/L (ref 19–32)
Calcium: 9.6 mg/dL (ref 8.4–10.5)
Chloride: 108 mEq/L (ref 96–112)
Creatinine, Ser: 1.26 mg/dL (ref 0.40–1.50)
GFR: 55.37 mL/min — ABNORMAL LOW (ref >60.00)
Glucose, Bld: 112 mg/dL — ABNORMAL HIGH (ref 70–99)
Potassium: 4.5 mEq/L (ref 3.5–5.1)
Sodium: 138 mEq/L (ref 135–145)

## 2019-02-11 LAB — HEMOGLOBIN A1C: Hgb A1c MFr Bld: 7.5 % — ABNORMAL HIGH (ref 4.6–6.5)

## 2019-02-13 ENCOUNTER — Ambulatory Visit: Payer: PPO | Admitting: Endocrinology

## 2019-02-20 DIAGNOSIS — C3492 Malignant neoplasm of unspecified part of left bronchus or lung: Secondary | ICD-10-CM | POA: Diagnosis not present

## 2019-02-20 DIAGNOSIS — E1122 Type 2 diabetes mellitus with diabetic chronic kidney disease: Secondary | ICD-10-CM | POA: Diagnosis not present

## 2019-02-20 DIAGNOSIS — E7849 Other hyperlipidemia: Secondary | ICD-10-CM | POA: Diagnosis not present

## 2019-02-20 DIAGNOSIS — I10 Essential (primary) hypertension: Secondary | ICD-10-CM | POA: Diagnosis not present

## 2019-02-20 DIAGNOSIS — N401 Enlarged prostate with lower urinary tract symptoms: Secondary | ICD-10-CM | POA: Diagnosis not present

## 2019-02-20 DIAGNOSIS — E7800 Pure hypercholesterolemia, unspecified: Secondary | ICD-10-CM | POA: Diagnosis not present

## 2019-02-20 DIAGNOSIS — I2510 Atherosclerotic heart disease of native coronary artery without angina pectoris: Secondary | ICD-10-CM | POA: Diagnosis not present

## 2019-02-20 DIAGNOSIS — N183 Chronic kidney disease, stage 3 (moderate): Secondary | ICD-10-CM | POA: Diagnosis not present

## 2019-02-20 DIAGNOSIS — C3490 Malignant neoplasm of unspecified part of unspecified bronchus or lung: Secondary | ICD-10-CM | POA: Diagnosis not present

## 2019-02-20 DIAGNOSIS — E11319 Type 2 diabetes mellitus with unspecified diabetic retinopathy without macular edema: Secondary | ICD-10-CM | POA: Diagnosis not present

## 2019-02-20 DIAGNOSIS — E1151 Type 2 diabetes mellitus with diabetic peripheral angiopathy without gangrene: Secondary | ICD-10-CM | POA: Diagnosis not present

## 2019-02-20 DIAGNOSIS — E782 Mixed hyperlipidemia: Secondary | ICD-10-CM | POA: Diagnosis not present

## 2019-02-21 DIAGNOSIS — C3492 Malignant neoplasm of unspecified part of left bronchus or lung: Secondary | ICD-10-CM | POA: Diagnosis not present

## 2019-02-21 DIAGNOSIS — Z23 Encounter for immunization: Secondary | ICD-10-CM | POA: Diagnosis not present

## 2019-02-21 DIAGNOSIS — C44722 Squamous cell carcinoma of skin of right lower limb, including hip: Secondary | ICD-10-CM | POA: Diagnosis not present

## 2019-02-28 DIAGNOSIS — C44519 Basal cell carcinoma of skin of other part of trunk: Secondary | ICD-10-CM | POA: Diagnosis not present

## 2019-03-04 ENCOUNTER — Encounter: Payer: Self-pay | Admitting: Endocrinology

## 2019-03-04 ENCOUNTER — Other Ambulatory Visit: Payer: Self-pay

## 2019-03-04 ENCOUNTER — Ambulatory Visit (INDEPENDENT_AMBULATORY_CARE_PROVIDER_SITE_OTHER): Payer: HMO | Admitting: Endocrinology

## 2019-03-04 VITALS — BP 134/50 | HR 85 | Ht 74.0 in | Wt 169.8 lb

## 2019-03-04 DIAGNOSIS — E1165 Type 2 diabetes mellitus with hyperglycemia: Secondary | ICD-10-CM

## 2019-03-04 DIAGNOSIS — I10 Essential (primary) hypertension: Secondary | ICD-10-CM | POA: Diagnosis not present

## 2019-03-04 DIAGNOSIS — Z23 Encounter for immunization: Secondary | ICD-10-CM | POA: Diagnosis not present

## 2019-03-04 DIAGNOSIS — E1151 Type 2 diabetes mellitus with diabetic peripheral angiopathy without gangrene: Secondary | ICD-10-CM | POA: Diagnosis not present

## 2019-03-04 DIAGNOSIS — E782 Mixed hyperlipidemia: Secondary | ICD-10-CM | POA: Diagnosis not present

## 2019-03-04 LAB — MICROALBUMIN / CREATININE URINE RATIO
Creatinine,U: 80.9 mg/dL
Microalb Creat Ratio: 11.4 mg/g (ref 0.0–30.0)
Microalb, Ur: 9.2 mg/dL — ABNORMAL HIGH (ref 0.0–1.9)

## 2019-03-04 LAB — GLUCOSE, POCT (MANUAL RESULT ENTRY): POC Glucose: 246 mg/dl — AB (ref 70–99)

## 2019-03-04 NOTE — Patient Instructions (Signed)
Must Take Miglitol when u start eating  Check blood sugars on waking up 1-2 days a week  Also check blood sugars about 2 hours after meals and do this after different meals by rotation  Recommended blood sugar levels on waking up are 90-130 and about 2 hours after meal is 130-160  Please bring your blood sugar monitor to each visit, thank you

## 2019-03-04 NOTE — Progress Notes (Signed)
Patient ID: Mario [name], male   DOB: 1940/07/04, 78 y.o.   MRN: [id number]   Reason for Appointment: Diabetes follow-up   History of Present Illness   Diagnosis: Type 2 DIABETES MELITUS, date of diagnosis:  1989     Previous history: He was initially diagnosed when hospitalized for pancreatitis and his previous endocrinologist had treated him with multiple medications because of progression of his diabetes. He was also put on Cycloset  which he has tolerated maximum dose He has had adjustments of his medication dosages the last couple of years and Januvia was restarted in 5/14 when blood sugars were higher. Dosages have been adjusted based on renal function Previously an A1c levels have been ranging from 6.6-7.5 His metformin was increased  when renal function was better and glipizide was changed to glipizide ER 10 mg . Did not appear to have better blood sugar control with Invokana He was started on Victoza for improved control when his A1c was 7.9% in 02/2015  Recent history:    Non-insulin hypoglycemic drugs:  Glyset 50 mg bid, glipizide ER 10 mg , Metformin 1 g twice a day,   Victoza 1.8 mg daily       His A1c is above 7% again, previously was down to 6.7  Blood sugar patterns and problems:  He has not been consistent with diet, exercise or glucose monitoring  Has not brought his monitor for download again to the office  He also is taking his Glyset after eating instead of before as previously directed  Although he thinks his blood sugars are only rarely in the 180 range.  He does not check readings after dinner  He has done a little bit of biking for exercise but not consistently  Only able to play pickle ball once a week now starting this past weekend  Also has gained weight  Has been able to take his Victoza regularly without any side effects like nausea, taking 1.8 mg  Blood sugar may be also higher when he is eating sweets such as donuts this morning  and sometimes ice cream although he thinks this is sugar-free  Otherwise no hypoglycemia, lowest blood sugar appears to be 112 fasting in the lab  Side effects from medications: None.  Monitors blood glucose: Once a day or less .    Glucometer: Freestyle     Recent range by recall: Range 120-180s    Meals: 2-3 meals per day, no lunch often (yogurt); breakfast is at 9 am cereal, [place] bacon, sometimes bread and eggs at breakfast; donuts occasionally      Dietician consultation last: 3/16        Physical activity: exercise:bike  gym 3+/7,Doing various exercises and playing pickle ball    Wt Readings from Last 3 Encounters:  03/04/19 169 lb 12.8 oz (77 kg)  01/17/19 160 lb 8 oz (72.8 kg)  11/28/18 161 lb (73 kg)    Lab Results  Component Value Date   HGBA1C 7.5 (H) 02/11/2019   HGBA1C 6.7 (H) 11/13/2018   HGBA1C 7.1 (H) 08/13/2018   Lab Results  Component Value Date   MICROALBUR 9.7 (H) 01/01/2018   LDLCALC 41 08/13/2018   CREATININE 1.26 02/11/2019     LABS:  Office Visit on 03/04/2019  Component Date Value Ref Range Status  . POC Glucose 03/04/2019 246* 70 - 99 mg/dl Final    Allergies as of 03/04/2019      Reactions   Penicillins Anaphylaxis   Has  patient had a PCN reaction causing immediate rash, facial/tongue/throat swelling, SOB or lightheadedness with hypotension: Yes Has patient had a PCN reaction causing severe rash involving mucus membranes or skin necrosis: No Has patient had a PCN reaction that required hospitalization: Yes Has patient had a PCN reaction occurring within the last 10 years: Unknown If all of the above answers are "NO", then may proceed with Cephalosporin use.   Plavix [clopidogrel Bisulfate] Hives   Pletal [cilostazol] Itching      Medication List       Accurate as of March 04, 2019 10:24 AM. If you have any questions, ask your nurse or doctor.        allopurinol 100 MG tablet Commonly known as: ZYLOPRIM Take 1 tablet by  mouth daily What changed: when to take this   ARTIFICIAL TEAR OP Apply 1 drop to eye daily as needed (dry eyes).   aspirin EC 81 MG tablet Take 81 mg by mouth daily.   atorvastatin 40 MG tablet Commonly known as: LIPITOR take 1 tablet by mouth daily at 6:00pm What changed: See the new instructions.   calcium carbonate 500 MG chewable tablet Commonly known as: TUMS - dosed in mg elemental calcium Chew 1 tablet by mouth daily as needed for indigestion or heartburn.   carvedilol 3.125 MG tablet Commonly known as: COREG TAKE 1 TABLET BY MOUTH TWICE A DAY What changed: when to take this   CoQ10 200 MG Caps Take 200 mg by mouth daily.   fenofibrate micronized 43 MG capsule Commonly known as: ANTARA Take 43 mg by mouth every evening.   ferrous sulfate 325 (65 FE) MG tablet Take 325 mg by mouth daily.   finasteride 5 MG tablet Commonly known as: PROSCAR Take 5 mg by mouth every evening.   folic acid 361 MCG tablet Commonly known as: FOLVITE Take 800 mcg by mouth daily.   freestyle lancets Use as instructed to check blood sugar 2 times per day dx code E11.65   FreeStyle Lite Devi USE TO TEST BLOOD SUGAR TWICE DAILY   glipiZIDE 10 MG 24 hr tablet Commonly known as: GLUCOTROL XL TAKE 1 TABLET BY MOUTH AT BEDTIME FOR DIABETES. What changed: See the new instructions.   glucose blood test strip Commonly known as: FREESTYLE LITE Use as instructed to check blood sugar 2 times per day dx code E11.65   lisinopril 10 MG tablet Commonly known as: ZESTRIL Take 1 tablet (10 mg total) by mouth daily.   metFORMIN 1000 MG tablet Commonly known as: GLUCOPHAGE TAKE 1 TABLET BY MOUTH TWICE DAILY FOR BREAKFAST AND DINNER FOR DIABETES. What changed: See the new instructions.   miglitol 50 MG tablet Commonly known as: GLYSET TAKE 1 TABLET BY MOUTH TWICE DAILY AT BREAKFAST AND DINNER FOR DIABETES. What changed: See the new instructions.   multivitamin-lutein Caps capsule Take 1  capsule by mouth 2 (two) times daily.   oxyCODONE 5 MG immediate release tablet Commonly known as: Roxicodone Take 1 tablet (5 mg total) by mouth every 6 (six) hours as needed.   tamsulosin 0.4 MG Caps capsule Commonly known as: FLOMAX Take 0.4 mg by mouth 2 (two) times daily.   Victoza 18 MG/3ML Sopn Generic drug: liraglutide INJECT 1.8MG  INTO THE SKIN DAILY AT DINNERTIME FOR DIABETES.   vitamin B-12 1000 MCG tablet Commonly known as: CYANOCOBALAMIN Take 1,000 mcg by mouth daily.   Vitamin D 50 MCG (2000 UT) Caps Take 2,000 Units by mouth daily.  Allergies:  Allergies  Allergen Reactions  . Penicillins Anaphylaxis    Has patient had a PCN reaction causing immediate rash, facial/tongue/throat swelling, SOB or lightheadedness with hypotension: Yes Has patient had a PCN reaction causing severe rash involving mucus membranes or skin necrosis: No Has patient had a PCN reaction that required hospitalization: Yes Has patient had a PCN reaction occurring within the last 10 years: Unknown If all of the above answers are "NO", then may proceed with Cephalosporin use.   Marland Kitchen Plavix [Clopidogrel Bisulfate] Hives  . Pletal [Cilostazol] Itching    Past Medical History:  Diagnosis Date  . Anemia   . Arthritis   . CAD (coronary artery disease)    a.  s/p IMI and BMS 1997;  b. Myoview 4/16:  inf scar with peri-infarct ischemia, EF 34%; high risk;  c. LHC 5/16:  3 v CAD >> CABG (free L-LAD, S-OM, S-dRCA)  . Cancer ([facility])    skin cancer  . Carotid stenosis    a. carotid US 6/16:  bilat ICA 1-39%  . Chronic kidney disease, stage II (mild)   . Chronic systolic CHF (congestive heart failure) ([facility] [place])   . COPD (chronic obstructive pulmonary disease) ([facility])   . Essential hypertension, benign   . Gout   . HLD (hyperlipidemia)   . Inguinal adenopathy 09/19/2018  . Ischemic cardiomyopathy    a. EF by myoview 4/16 34%; b. LHC 5/16: EF 40-45%;  c. intraop TEE 6/16: EF 45-50%  . Orthostasis    . PAD (peripheral artery disease) (HCC)    Dr. Fletcher Anon  . Pneumonia    hx of  . Type II or unspecified type diabetes mellitus without mention of complication, uncontrolled     Past Surgical History:  Procedure Laterality Date  . ABDOMINAL AORTAGRAM N/A 10/09/2013   Procedure: ABDOMINAL Maxcine Ham;  Surgeon: Wellington Hampshire, MD;  Location: [facility];  Service: Cardiovascular;  Laterality: N/A;  . ABDOMINAL AORTOGRAM N/A 12/28/2016   Procedure: ABDOMINAL AORTOGRAM;  Surgeon: Waynetta Sandy, MD;  Location: [facility];  Service: Cardiovascular;  Laterality: N/A;  . ABDOMINAL AORTOGRAM W/LOWER EXTREMITY Bilateral 09/11/2018   Procedure: ABDOMINAL AORTOGRAM W/LOWER EXTREMITY;  Surgeon: Serafina Mitchell, MD;  Location: [facility];  Service: Cardiovascular;  Laterality: Bilateral;  . CARDIAC CATHETERIZATION N/A 10/02/2014   Procedure: Left Heart Cath and Coronary Angiography;  Surgeon: Belva Crome, MD;  Location: [facility];  Service: Cardiovascular;  Laterality: N/A;  . CATARACT EXTRACTION    . CHOLECYSTECTOMY    . COLONOSCOPY    . CORONARY ARTERY BYPASS GRAFT N/A 11/03/2014   Procedure: CORONARY ARTERY BYPASS GRAFTING  times three using left internal mammary and right greater saphenous vein;  Surgeon: Gaye Pollack, MD;  Location: [facility];  Service: Open Heart Surgery;  Laterality: N/A;  . FEMORAL-POPLITEAL BYPASS GRAFT Left 09/18/2018   Procedure: LEFT ILEO FEMORAL ENDARTERECTOMY WITH VEIN PATCH AND ANGIOPLASTY, POPLITEAL  ENDARTERECTOMY, FEM POP BYPASS;  Surgeon: Serafina Mitchell, MD;  Location: [facility];  Service: Vascular;  Laterality: Left;  . HERNIA REPAIR    . LOWER EXTREMITY ANGIOGRAPHY Bilateral 12/28/2016   Procedure: Lower Extremity Angiography;  Surgeon: Waynetta Sandy, MD;  Location: [facility];  Service: Cardiovascular;  Laterality: Bilateral;  . LYMPH NODE DISSECTION Right 09/19/2018   Procedure: SUPERFICIAL RIGHT LYMPH NODE DISSECTION;   Surgeon: Fanny Skates, MD;  Location: [facility];  Service: General;  Laterality: Right;  . MELANOMA EXCISION    .  PERIPHERAL VASCULAR BALLOON ANGIOPLASTY Right 12/28/2016   Procedure: PERIPHERAL VASCULAR BALLOON ANGIOPLASTY;  Surgeon: Waynetta Sandy, MD;  Location: [facility];  Service: Cardiovascular;  Laterality: Right;  SFA UNSUCCESSFUL PTA  UNABLE TO CROSS LESION  . SKIN LESION EXCISION     multiple  . TEE WITHOUT CARDIOVERSION N/A 11/03/2014   Procedure: TRANSESOPHAGEAL ECHOCARDIOGRAM (TEE);  Surgeon: Gaye Pollack, MD;  Location: [facility];  Service: Open Heart Surgery;  Laterality: N/A;  . VIDEO BRONCHOSCOPY WITH ENDOBRONCHIAL NAVIGATION N/A 11/22/2018   Procedure: VIDEO BRONCHOSCOPY WITH ENDOBRONCHIAL NAVIGATION;  Surgeon: [name], MD;  Location: [facility];  Service: Thoracic;  Laterality: N/A;    Family History  Problem Relation Age of Onset  . Colon cancer Father   . Heart disease Mother   . Non-Hodgkin's lymphoma Daughter     Social History:  reports that he quit smoking about 4 years ago. His smoking use included cigarettes. He has a 25.00 pack-year smoking history. He has never used smokeless tobacco. He reports that he does not drink alcohol or use drugs.  Review of Systems:   Hypertension:  taking lisinopril 10 mg and carvedilol low-dose Lisinopril increased in 6/20  BP Readings from Last 3 Encounters:  03/04/19 (!) 134/50  01/17/19 140/70  11/28/18 (!) 143/63   No renal dysfunction recently:  Lab Results  Component Value Date   CREATININE 1.26 02/11/2019   CREATININE 1.43 (H) 11/19/2018   CREATININE 1.23 11/13/2018     Lipids: Adequately controlled with atorvastatin 40 mg, LDL below 70 but HDL is still low     Lab Results  Component Value Date   CHOL 94 08/13/2018   HDL 30.00 (L) 08/13/2018   LDLCALC 41 08/13/2018   TRIG 117.0 08/13/2018   CHOLHDL 3 08/13/2018     Has had flu vaccine His last Pneumovax was in 2009  He has  finished his radiation for lung cancer, for follow-up next month    Examination:   BP (!) 134/50 (BP Location: Left Arm, Patient Position: Sitting, Cuff Size: Normal)   Pulse 85   Ht 6\' 2"  (1.88 m)   Wt 169 lb 12.8 oz (77 kg)   SpO2 97%   BMI 21.80 kg/m   Body mass index is 21.8 kg/m.     ASSESSMENT/ PLAN:   Diabetes type 2, nonobese:  See history of present illness for detailed discussion of his current management, blood sugar patterns and problems identified  Blood glucose control is not as good with A1c 7.5  He is on multiple drugs including Victoza As discussed above he can do better with his diet, glucose monitoring, exercise Also needs to take his miglitol before starting weekly instead of after Currently not monitoring blood sugar after meals much and this may help with his diet  He will continue the same medication regimen but make sure he takes his Glyset before starting.  HYPERTENSION: Blood pressure is improved  Pneumovax given   There are no Patient Instructions on file for this visit.   [name] 03/04/2019, 10:24 AM

## 2019-03-21 ENCOUNTER — Ambulatory Visit: Payer: Self-pay | Admitting: Radiation Oncology

## 2019-03-22 DIAGNOSIS — N401 Enlarged prostate with lower urinary tract symptoms: Secondary | ICD-10-CM | POA: Diagnosis not present

## 2019-03-22 DIAGNOSIS — I10 Essential (primary) hypertension: Secondary | ICD-10-CM | POA: Diagnosis not present

## 2019-03-22 DIAGNOSIS — E7800 Pure hypercholesterolemia, unspecified: Secondary | ICD-10-CM | POA: Diagnosis not present

## 2019-03-22 DIAGNOSIS — I2510 Atherosclerotic heart disease of native coronary artery without angina pectoris: Secondary | ICD-10-CM | POA: Diagnosis not present

## 2019-03-22 DIAGNOSIS — C3490 Malignant neoplasm of unspecified part of unspecified bronchus or lung: Secondary | ICD-10-CM | POA: Diagnosis not present

## 2019-03-22 DIAGNOSIS — E1122 Type 2 diabetes mellitus with diabetic chronic kidney disease: Secondary | ICD-10-CM | POA: Diagnosis not present

## 2019-03-22 DIAGNOSIS — N1831 Chronic kidney disease, stage 3a: Secondary | ICD-10-CM | POA: Diagnosis not present

## 2019-03-22 DIAGNOSIS — E1151 Type 2 diabetes mellitus with diabetic peripheral angiopathy without gangrene: Secondary | ICD-10-CM | POA: Diagnosis not present

## 2019-03-22 DIAGNOSIS — E11319 Type 2 diabetes mellitus with unspecified diabetic retinopathy without macular edema: Secondary | ICD-10-CM | POA: Diagnosis not present

## 2019-03-22 DIAGNOSIS — C3492 Malignant neoplasm of unspecified part of left bronchus or lung: Secondary | ICD-10-CM | POA: Diagnosis not present

## 2019-03-22 DIAGNOSIS — E782 Mixed hyperlipidemia: Secondary | ICD-10-CM | POA: Diagnosis not present

## 2019-03-22 DIAGNOSIS — E7849 Other hyperlipidemia: Secondary | ICD-10-CM | POA: Diagnosis not present

## 2019-03-24 DIAGNOSIS — C44722 Squamous cell carcinoma of skin of right lower limb, including hip: Secondary | ICD-10-CM | POA: Diagnosis not present

## 2019-03-25 DIAGNOSIS — E1122 Type 2 diabetes mellitus with diabetic chronic kidney disease: Secondary | ICD-10-CM | POA: Diagnosis not present

## 2019-03-25 DIAGNOSIS — E11319 Type 2 diabetes mellitus with unspecified diabetic retinopathy without macular edema: Secondary | ICD-10-CM | POA: Diagnosis not present

## 2019-03-25 DIAGNOSIS — N1831 Chronic kidney disease, stage 3a: Secondary | ICD-10-CM | POA: Diagnosis not present

## 2019-03-25 DIAGNOSIS — E782 Mixed hyperlipidemia: Secondary | ICD-10-CM | POA: Diagnosis not present

## 2019-03-25 DIAGNOSIS — E7800 Pure hypercholesterolemia, unspecified: Secondary | ICD-10-CM | POA: Diagnosis not present

## 2019-03-25 DIAGNOSIS — C3492 Malignant neoplasm of unspecified part of left bronchus or lung: Secondary | ICD-10-CM | POA: Diagnosis not present

## 2019-03-25 DIAGNOSIS — I10 Essential (primary) hypertension: Secondary | ICD-10-CM | POA: Diagnosis not present

## 2019-03-25 DIAGNOSIS — C3490 Malignant neoplasm of unspecified part of unspecified bronchus or lung: Secondary | ICD-10-CM | POA: Diagnosis not present

## 2019-03-25 DIAGNOSIS — E1151 Type 2 diabetes mellitus with diabetic peripheral angiopathy without gangrene: Secondary | ICD-10-CM | POA: Diagnosis not present

## 2019-03-25 DIAGNOSIS — E7849 Other hyperlipidemia: Secondary | ICD-10-CM | POA: Diagnosis not present

## 2019-03-25 DIAGNOSIS — I2510 Atherosclerotic heart disease of native coronary artery without angina pectoris: Secondary | ICD-10-CM | POA: Diagnosis not present

## 2019-03-25 DIAGNOSIS — N401 Enlarged prostate with lower urinary tract symptoms: Secondary | ICD-10-CM | POA: Diagnosis not present

## 2019-03-26 ENCOUNTER — Other Ambulatory Visit: Payer: Self-pay | Admitting: *Deleted

## 2019-03-26 NOTE — Patient Outreach (Signed)
  [facility]) Care Management Chronic Special Needs Program    03/26/2019  Name: Mario [name], DOB: [birth year]  MRN: [id number]   Mario [name] is enrolled in a chronic special needs plan for Diabetes.  Attempted to reach Mario [name] via preferred (home) number to complete initial assessment; no answer; left HIPAA compliant message requesting return call.  Plan: If client does not return call, will make second outreach call within one week.  Kelli Churn RN, CCM, [facility] [phone]

## 2019-03-28 ENCOUNTER — Encounter: Payer: Self-pay | Admitting: Radiation Oncology

## 2019-03-28 ENCOUNTER — Ambulatory Visit
Admission: RE | Admit: 2019-03-28 | Discharge: 2019-03-28 | Disposition: A | Discharge status: Home / Self Care | Payer: HMO | Source: Ambulatory Visit | Attending: Radiation Oncology | Admitting: Radiation Oncology

## 2019-03-28 ENCOUNTER — Other Ambulatory Visit: Payer: Self-pay

## 2019-03-28 VITALS — BP 115/67 | HR 79 | Temp 98.5°F | Resp 20 | Ht 74.0 in | Wt 166.4 lb

## 2019-03-28 DIAGNOSIS — Z79899 Other long term (current) drug therapy: Secondary | ICD-10-CM | POA: Diagnosis not present

## 2019-03-28 DIAGNOSIS — Z7984 Long term (current) use of oral hypoglycemic drugs: Secondary | ICD-10-CM | POA: Diagnosis not present

## 2019-03-28 DIAGNOSIS — C3412 Malignant neoplasm of upper lobe, left bronchus or lung: Secondary | ICD-10-CM

## 2019-03-28 DIAGNOSIS — Z7982 Long term (current) use of aspirin: Secondary | ICD-10-CM | POA: Insufficient documentation

## 2019-03-28 DIAGNOSIS — R918 Other nonspecific abnormal finding of lung field: Secondary | ICD-10-CM | POA: Insufficient documentation

## 2019-03-28 DIAGNOSIS — Z08 Encounter for follow-up examination after completed treatment for malignant neoplasm: Secondary | ICD-10-CM | POA: Diagnosis not present

## 2019-03-28 NOTE — Progress Notes (Signed)
[name] presents today for f/u with Dr. Sondra Come. Pt would like to have CT scan done on chest to evaluate status. Pt reports occasional productive cough at night. Pt denies hemoptysis. Pt denies difficulty swallowing. Pt reports ongoing SOB with exertion. Pt reports recent "discomfort" in chest, intermittent over the past "couple of months".  BP 115/67 (BP Location: Left Arm, Patient Position: Sitting)   Pulse 79   Temp 98.5 F (36.9 C) (Temporal)   Resp 20   Ht 6\' 2"  (1.88 m)   Wt 166 lb 6 oz (75.5 kg)   SpO2 100%   BMI 21.36 kg/m   Wt Readings from Last 3 Encounters:  03/28/19 166 lb 6 oz (75.5 kg)  03/04/19 169 lb 12.8 oz (77 kg)  01/17/19 160 lb 8 oz (72.8 kg)   Loma Sousa, RN BSN

## 2019-03-28 NOTE — Progress Notes (Signed)
Radiation Oncology         [phone] ________________________________  Name: Mario [name] MRN: [id number]  Date: 03/28/2019  DOB: Oct 15, 1940  Follow-Up Visit Note  CC: Mario Carol, MD  Mario Carol, MD    ICD-10-CM   1. Primary cancer of left upper lobe of lung (HCC)  C34.12 CT Chest Wo Contrast    Diagnosis:  PresumptiveStage IIb (T3a, N0, M0)non-small cell lung cancer presenting in the leftupper lung area  Interval Since Last Radiation:  3 months 12/05/2018 - 12/14/2018: LUL / 60 Gy in 5 fractions (SBRT)  Narrative:  The patient returns today for routine follow-up. His interval history is generally unremarkable.  On review of systems, he some dyspnea with exertion but is able to ride his bike as well as play pickle ball.  He denies any hemoptysis or pain within the chest area.  He denies any new bony pain headaches dizziness or blurred vision.  No significant problems with coughing.   ALLERGIES:  is allergic to penicillins; plavix [clopidogrel bisulfate]; and pletal [cilostazol].  Meds: Current Outpatient Medications  Medication Sig Dispense Refill  . allopurinol (ZYLOPRIM) 100 MG tablet Take 1 tablet by mouth daily (Patient taking differently: Take 100 mg by mouth at bedtime. ) 90 tablet 0  . ARTIFICIAL TEAR OP Apply 1 drop to eye daily as needed (dry eyes).    Marland Kitchen aspirin EC 81 MG tablet Take 81 mg by mouth daily.    Marland Kitchen atorvastatin (LIPITOR) 40 MG tablet take 1 tablet by mouth daily at 6:00pm  (Patient taking differently: Take 40 mg by mouth at bedtime. ) 90 tablet 0  . Blood Glucose Monitoring Suppl (FREESTYLE LITE) DEVI USE TO TEST BLOOD SUGAR TWICE DAILY 1 each 1  . calcium carbonate (TUMS - DOSED IN MG ELEMENTAL CALCIUM) 500 MG chewable tablet Chew 1 tablet by mouth daily as needed for indigestion or heartburn.    . carvedilol (COREG) 3.125 MG tablet TAKE 1 TABLET BY MOUTH TWICE A DAY (Patient taking differently: Take 3.125 mg by mouth 2 (two) times daily with  a meal. ) 180 tablet 3  . Cholecalciferol (VITAMIN D) 2000 units CAPS Take 2,000 Units by mouth daily.    . Coenzyme Q10 (COQ10) 200 MG CAPS Take 200 mg by mouth daily.    . fenofibrate micronized (ANTARA) 43 MG capsule Take 43 mg by mouth every evening.    . ferrous sulfate 325 (65 FE) MG tablet Take 325 mg by mouth daily.    . finasteride (PROSCAR) 5 MG tablet Take 5 mg by mouth every evening.   3  . folic acid (FOLVITE) 546 MCG tablet Take 800 mcg by mouth daily.     Marland Kitchen glipiZIDE (GLUCOTROL XL) 10 MG 24 hr tablet TAKE 1 TABLET BY MOUTH AT BEDTIME FOR DIABETES. (Patient taking differently: Take 10 mg by mouth at bedtime. ) 30 tablet 2  . glucose blood (FREESTYLE LITE) test strip Use as instructed to check blood sugar 2 times per day dx code E11.65 100 each 3  . Lancets (FREESTYLE) lancets Use as instructed to check blood sugar 2 times per day dx code E11.65 100 each 3  . lisinopril (ZESTRIL) 10 MG tablet Take 1 tablet (10 mg total) by mouth daily. 90 tablet 1  . metFORMIN (GLUCOPHAGE) 1000 MG tablet TAKE 1 TABLET BY MOUTH TWICE DAILY FOR BREAKFAST AND DINNER FOR DIABETES. (Patient taking differently: Take 1,000 mg by mouth 2 (two) times daily with a meal. ) 60 tablet  2  . miglitol (GLYSET) 50 MG tablet TAKE 1 TABLET BY MOUTH TWICE DAILY AT BREAKFAST AND DINNER FOR DIABETES. (Patient taking differently: Take 50 mg by mouth 2 (two) times daily. ) 60 tablet 2  . multivitamin-lutein (OCUVITE-LUTEIN) CAPS capsule Take 1 capsule by mouth 2 (two) times daily.    Marland Kitchen oxyCODONE (ROXICODONE) 5 MG immediate release tablet Take 1 tablet (5 mg total) by mouth every 6 (six) hours as needed. 30 tablet 0  . tamsulosin (FLOMAX) 0.4 MG CAPS capsule Take 0.4 mg by mouth 2 (two) times daily.    Marland Kitchen VICTOZA 18 MG/3ML SOPN INJECT 1.8MG  INTO THE SKIN DAILY AT DINNERTIME FOR DIABETES. 9 mL 3  . vitamin B-12 (CYANOCOBALAMIN) 1000 MCG tablet Take 1,000 mcg by mouth daily.     No current facility-administered medications for  this encounter.     Physical Findings: The patient is in no acute distress. Patient is alert and oriented.  height is 6\' 2"  (1.88 m) and weight is 166 lb 6 oz (75.5 kg). His temporal temperature is 98.5 F (36.9 C). His blood pressure is 115/67 and his pulse is 79. His respiration is 20 and oxygen saturation is 100%. .  No significant changes. Lungs are clear to auscultation bilaterally. Heart has regular rate and rhythm. No palpable cervical, supraclavicular, or axillary adenopathy. Abdomen soft, non-tender, normal bowel sounds.  Lab Findings: Lab Results  Component Value Date   WBC 7.1 11/19/2018   HGB 9.8 (L) 11/19/2018   HCT 32.7 (L) 11/19/2018   MCV 90.6 11/19/2018   PLT 200 11/19/2018    Radiographic Findings: No results found.  Impression:  PresumptiveStage IIb (T3a, N0, M0)non-small cell lung cancer presenting in the leftupper lung area  Patient is doing well clinically at this time.  I placed an order for a chest CT scan to follow-up on his stereotactic body radiation therapy directed at the left upper lung lesion.  Assuming this can stable he will be scheduled for routine follow-up in 3 months  Plan:  Patient will return for routine follow up in 3 months unless upcoming chest CT scan shows new issues.  ____________________________________ Mario Pray, MD   This document serves as a record of services personally performed by Mario Pray, MD. It was created on his behalf by Mario [name], a trained medical scribe. The creation of this record is based on the scribe's personal observations and the provider's statements to them. This document has been checked and approved by the attending provider.

## 2019-03-28 NOTE — Patient Instructions (Signed)
Coronavirus (COVID-19) Are you at risk?  Are you at risk for the Coronavirus (COVID-19)?  To be considered HIGH RISK for Coronavirus (COVID-19), you have to meet the following criteria:  . Traveled to [place], [place], [place], [place] or [place]; or in the [place] to [place], [place], [place], or [place]; and have fever, cough, and shortness of breath within the last 2 weeks of travel OR . Been in close contact with a person diagnosed with COVID-19 within the last 2 weeks and have fever, cough, and shortness of breath . IF YOU DO NOT MEET THESE CRITERIA, YOU ARE CONSIDERED LOW RISK FOR COVID-19.  What to do if you are HIGH RISK for COVID-19?  . If you are having a medical emergency, call 911. . Seek medical care right away. Before you go to a doctor's office, urgent care or emergency department, call ahead and tell them about your recent travel, contact with someone diagnosed with COVID-19, and your symptoms. You should receive instructions from your physician's office regarding next steps of care.  . When you arrive at healthcare provider, tell the healthcare staff immediately you have returned from visiting [place], [place], [place], [place] or [place]; or traveled in the [place] to [place], [place], [place], or [place]; in the last two weeks or you have been in close contact with a person diagnosed with COVID-19 in the last 2 weeks.   . Tell the health care staff about your symptoms: fever, cough and shortness of breath. . After you have been seen by a medical provider, you will be either: o Tested for (COVID-19) and discharged home on quarantine except to seek medical care if symptoms worsen, and asked to  - Stay home and avoid contact with others until you get your results (4-5 days)  - Avoid travel on public transportation if possible (such as bus, train, or airplane) or o Sent to the Emergency Department by EMS for evaluation, COVID-19 testing, and possible  admission depending on your condition and test results.  What to do if you are LOW RISK for COVID-19?  Reduce your risk of any infection by using the same precautions used for avoiding the common cold or flu:  . Wash your hands often with soap and warm water for at least 20 seconds.  If soap and water are not readily available, use an alcohol-based hand sanitizer with at least 60% alcohol.  . If coughing or sneezing, cover your mouth and nose by coughing or sneezing into the elbow areas of your shirt or coat, into a tissue or into your sleeve (not your hands). . Avoid shaking hands with others and consider head nods or verbal greetings only. . Avoid touching your eyes, nose, or mouth with unwashed hands.  . Avoid close contact with people who are sick. . Avoid places or events with large numbers of people in one location, like concerts or sporting events. . Carefully consider travel plans you have or are making. . If you are planning any travel outside or inside the [place], visit the CDC's Travelers' Health webpage for the latest health notices. . If you have some symptoms but not all symptoms, continue to monitor at home and seek medical attention if your symptoms worsen. . If you are having a medical emergency, call 911.   ADDITIONAL HEALTHCARE OPTIONS FOR PATIENTS  [facility] / e-Visit: [email or web address]         [facility]: [phone]  [facility]: [phone]                   [facility]: [phone]   

## 2019-03-29 ENCOUNTER — Ambulatory Visit: Payer: HMO | Admitting: *Deleted

## 2019-03-29 ENCOUNTER — Ambulatory Visit: Payer: Self-pay | Admitting: *Deleted

## 2019-04-02 ENCOUNTER — Encounter: Payer: Self-pay | Admitting: *Deleted

## 2019-04-02 ENCOUNTER — Other Ambulatory Visit: Payer: Self-pay | Admitting: *Deleted

## 2019-04-02 NOTE — Patient Outreach (Addendum)
[facility]) Care Management Chronic Special Needs Program  04/02/2019  Name: Mario [name] DOB: [birth year]  MRN: [id number]  Mario [name] is enrolled in a chronic special needs plan for Diabetes. Chronic Care Management Coordinator telephoned client to review health risk assessment and to develop individualized care plan.  Introduced the chronic care management program, importance of client participation, and taking their care plan to all provider appointments and inpatient facilities.  Reviewed the transition of care process and possible referral to community care management.  Subjective: Mario [name] states he is doing well regarding his chronic disease states of Type 2 DM, HTN, CAD, PAD and non small lung cancer. He says his most recent Hgb A1C was higher than usual and he attributes this to his inability to participate in playing pickleball 5 days a [facility] [employer] due to the Covid pandemic. He was appreciative of the suggestion of checking into buying used home exercise equipment. He says his exercise tolerance is limited due to his peripheral arterial disease (PAD)  that causes leg cramps. He says he has to rest after about 20 minutes of exercise. He says he had surgery on his left leg in late April to improve the circulation. He says he does not check his blood sugar on a regular basis and when he does check it, it is usually after exercising. He denies hypoglycemia symptoms and defines his hypoglycemic threshold as a blood sugar of <70. He states he has a home blood pressure monitor but rarely checks it because his readings in the provider's offices are usually "good".   He reports no recent cardiac issues: chest pain or dysrhythmias, dyspnea on exertion.  He reports good medication taking behavior and refused the offer of a medication box stating, "I keep my medicines in a plastic bag on the table and that system works for me" He says he will see his radiation  oncologist in February about the need for any additional treatment for the lung cancer. He states he lives in [place] with his wife. 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 He says all 3 of his adult children ( 2 daughters and 1 son) and his grandchildren all live in [place].  He states his daughter in law is a licensed naturopath and she counsels him on his diet and vitamin supplementation.   Goals      Patient Stated   . "I want to exercise more since I can't play pickleball [facility] the [employer] like I was doing" (pt-stated)     Discussed the benefits of activity ( lowers blood sugar by increasing insulin sensitivity, lowers cholesterol, lowers BP, decreases stress, improves mood) Discussed strategies to improve frequency of exercise per week At client's request, e-mailed him the name, address and phone number of [employer] so that he can investigate purchasing home exercise equipment      Other   . Client understands the importance of follow-up with providers by attending scheduled visits     Client voices consistent adherence to provider appointments and states he is up to date on all health maintenance tests    . Client will report no worsening of symptoms related to heart disease within the next 6 months     Discussed current status of heart disease Mailed Emmi education on "Heart Disease in Diabetics" to client's home address    . Client will verbalize knowledge of chronic lung disease  as evidenced by no ED visits or Inpatient stays related to chronic lung disease      Assessed current status of client's COPD and lung cancer and importance of ongoing follow up with specialists     . Client will verbalize knowledge of self management of Hypertension as evidences by BP reading of 140/90 or less; or as defined by provider     Assessed status of blood pressure control  Reviewed blood pressure targets Reviewed medication taking behavior  related to blood pressure medicines Mailed Emmi education Diabetes and Blood Pressure to client's home address    . HEMOGLOBIN A1C < 7.0     Diabetes self management actions reviewed:  Glucose monitoring per provider recommendations- encouraged him to check post meal per Dr Ronnie Derby suggestion  Eat Healthy  Check feet daily  Visit provider every 3-6 months as directed  Hbg A1C level every 3-6 months.  Eye Exam yearly  Discussed targets for CBGs and Hgb A1C Discussed strategies to improve blood sugar control including taking his miglitol (Glyset) before meals, not after. Mailed Emmi education on Type 2 Diabetes: Food and Exercise to client's home address    . Maintain timely refills of diabetic medication as prescribed within the year .     Assessed medication taking behavior for diabetes medications and reviewed medication dispense report    . Obtain annual  Lipid Profile, LDL-C     Discussed targets of lipid profile elements , reviewed results of most recent lipid profile completed 08/13/18    . Obtain Annual Eye (retinal)  Exam      Dicussed importance of yearly eye exam  Assessed frequency of exams Most recent diabetes eye exam completed on 11/20/18 with no evidence of diabetic retinopathy    . Obtain Annual Foot Exam     Assessed frequency of foot checks and importance of yearly diabetic foot exam     . Obtain annual screen for micro albuminuria (urine) , nephropathy (kidney problems)     Reviewed importance  of checking urine for protein to detect early signs of diabetic nephropathy Reviewed results (within normal limits) of urine for protein done 03/04/19    . Obtain Hemoglobin A1C at least 2 times per year     Discuss meaning of Hgb A1C, Hgb  A1C target and most recent results of Hgb A1C of 7.5% on 02/11/19 and strategies to assist with meeting Hgb A1C goal    . Visit Primary Care Provider or Endocrinologist at least 2 times per year      Assessed adherence and  importance of keeping to provider appointments Client has completed 3 visits with his endocrinologist in 2020 Reviewed appointment tab for no show percentage (2%)       Assessment: Client is not meeting diabetes self-management goal of hemoglobin A1C of <7% with most recent reading of 7.5 % on 02/11/19 without reports of hypoglycemia . Client has good understanding of:  COVID-19 cause, symptoms, precautions (social distancing, stay at home order, hand washing, wear face covering when unable to maintain or ensure 6 foot social distancing), and symptoms requiring provider notification.  Plan:   Send successful outreach letter with a copy of their individualized care plan, Send individual care plan to provider and Send educational material  Chronic care management coordination will outreach in:  6 Months    Kelli Churn RN, CCM, [place] Management Coordinator [place] [facility] [phone]

## 2019-04-03 ENCOUNTER — Telehealth: Payer: Self-pay | Admitting: *Deleted

## 2019-04-03 NOTE — Telephone Encounter (Signed)
Called patient to inform of CT for 04-08-19 - arrival time- 1:15 pm @ [facility], no restrictions to test, spoke with patient's wife Pamala Hurry and she is aware of this test

## 2019-04-08 ENCOUNTER — Other Ambulatory Visit: Payer: Self-pay

## 2019-04-08 ENCOUNTER — Ambulatory Visit (HOSPITAL_COMMUNITY)
Admission: RE | Admit: 2019-04-08 | Discharge: 2019-04-08 | Disposition: A | Discharge status: Home / Self Care | Payer: HMO | Source: Ambulatory Visit | Attending: Radiation Oncology | Admitting: Radiation Oncology

## 2019-04-08 DIAGNOSIS — C3412 Malignant neoplasm of upper lobe, left bronchus or lung: Secondary | ICD-10-CM | POA: Insufficient documentation

## 2019-04-08 DIAGNOSIS — C3490 Malignant neoplasm of unspecified part of unspecified bronchus or lung: Secondary | ICD-10-CM | POA: Diagnosis not present

## 2019-04-09 DIAGNOSIS — C44519 Basal cell carcinoma of skin of other part of trunk: Secondary | ICD-10-CM | POA: Diagnosis not present

## 2019-04-10 NOTE — Progress Notes (Signed)
Patient informed of results on recent chest CT scan.  He will have routine follow-up in 3 months

## 2019-04-23 DIAGNOSIS — C44722 Squamous cell carcinoma of skin of right lower limb, including hip: Secondary | ICD-10-CM | POA: Diagnosis not present

## 2019-05-01 ENCOUNTER — Encounter: Payer: Self-pay | Admitting: *Deleted

## 2019-05-01 ENCOUNTER — Other Ambulatory Visit: Payer: Self-pay | Admitting: Dermatology

## 2019-05-01 DIAGNOSIS — C4441 Basal cell carcinoma of skin of scalp and neck: Secondary | ICD-10-CM | POA: Diagnosis not present

## 2019-05-01 DIAGNOSIS — C4492 Squamous cell carcinoma of skin, unspecified: Secondary | ICD-10-CM | POA: Diagnosis not present

## 2019-05-01 DIAGNOSIS — L570 Actinic keratosis: Secondary | ICD-10-CM | POA: Diagnosis not present

## 2019-05-08 ENCOUNTER — Telehealth: Payer: Self-pay | Admitting: Oncology

## 2019-05-08 NOTE — Telephone Encounter (Signed)
Called patient per provider request, patient is notified.

## 2019-05-09 ENCOUNTER — Inpatient Hospital Stay: Payer: HMO | Attending: Oncology | Admitting: Oncology

## 2019-05-09 ENCOUNTER — Other Ambulatory Visit: Payer: Self-pay

## 2019-05-09 DIAGNOSIS — R918 Other nonspecific abnormal finding of lung field: Secondary | ICD-10-CM | POA: Diagnosis not present

## 2019-05-09 DIAGNOSIS — C3412 Malignant neoplasm of upper lobe, left bronchus or lung: Secondary | ICD-10-CM | POA: Insufficient documentation

## 2019-05-09 DIAGNOSIS — Z923 Personal history of irradiation: Secondary | ICD-10-CM | POA: Diagnosis not present

## 2019-05-09 DIAGNOSIS — Z85828 Personal history of other malignant neoplasm of skin: Secondary | ICD-10-CM | POA: Insufficient documentation

## 2019-05-09 DIAGNOSIS — Z79899 Other long term (current) drug therapy: Secondary | ICD-10-CM | POA: Insufficient documentation

## 2019-05-09 NOTE — Progress Notes (Signed)
Hematology and Oncology Follow Up for Telemedicine Visits  JOHANNA STAFFORD [id number] 01-15-1941 79 y.o. 05/09/2019 2:40 PM Polite, [name], MDPolite, [name], MD   I connected with Mr. Lohr on 05/09/19 at  3:00 PM EST by video enabled telemedicine visit and verified that I am speaking with the correct person using two identifiers.   I discussed the limitations, risks, security and privacy concerns of performing an evaluation and management service by telemedicine and the availability of in-person appointments. I also discussed with the patient that there may be a patient responsible charge related to this service. The patient expressed understanding and agreed to proceed.  Other persons participating in the visit and their role in the encounter:  None  Patient's location:  home Provider's location:  office    Principle Diagnosis: 78 year old man with:  1.  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  2.  Left upper lung neoplasm diagnosed in February 2020.    Is present stage IIb non-small cell cancer.    Prior Therapy: Status post surgical excision of a right knee skin lesion.  Pathology showed squamous cell carcinoma in December 2019.  He is status post superficial inguinal lymph node dissection completed by Dr. Dalbert Batman on September 19, 2018.  He is status post radiation therapy to the left lower lobe of the lung between July 15 and December 14, 2018.  He received 60 Gray in 5 fractions.  Current therapy: Active surveillance.  Interim History: Mr. Zarazua reports feeling reasonably well without any major complications related to his previous radiation therapy.  He does report recurrent skin lesions on his hands and scalp that are likely to be either squamous cell or basal cell carcinoma.  He has been followed with Dr. [name] and has been prescribed topical treatment as well as oral therapy for the time being.  He does  not report any recent respiratory complaints or shortness of breath.  Performance status and quality of life remains unchanged.     Medications: I have reviewed the patient's current medications.  Current Outpatient Medications  Medication Sig Dispense Refill  . allopurinol (ZYLOPRIM) 100 MG tablet Take 1 tablet by mouth daily (Patient taking differently: Take 100 mg by mouth at bedtime. ) 90 tablet 0  . ARTIFICIAL TEAR OP Apply 1 drop to eye daily as needed (dry eyes).    Marland Kitchen aspirin EC 81 MG tablet Take 81 mg by mouth daily.    Marland Kitchen atorvastatin (LIPITOR) 40 MG tablet take 1 tablet by mouth daily at 6:00pm  (Patient taking differently: Take 40 mg by mouth at bedtime. ) 90 tablet 0  . Blood Glucose Monitoring Suppl (FREESTYLE LITE) DEVI USE TO TEST BLOOD SUGAR TWICE DAILY 1 each 1  . calcium carbonate (TUMS - DOSED IN MG ELEMENTAL CALCIUM) 500 MG chewable tablet Chew 1 tablet by mouth daily as needed for indigestion or heartburn.    . carvedilol (COREG) 3.125 MG tablet TAKE 1 TABLET BY MOUTH TWICE A DAY (Patient taking differently: Take 3.125 mg by mouth 2 (two) times daily with a meal. ) 180 tablet 3  . Cholecalciferol (VITAMIN D) 2000 units CAPS Take 2,000 Units by mouth daily.    . fenofibrate micronized (ANTARA) 43 MG capsule Take 43 mg by mouth every evening.    . ferrous sulfate 325 (65 FE) MG tablet Take 325 mg by mouth daily.    . finasteride (PROSCAR) 5 MG tablet Take 5 mg by mouth  every evening.   3  . folic acid (FOLVITE) 867 MCG tablet Take 800 mcg by mouth daily.     Marland Kitchen glipiZIDE (GLUCOTROL XL) 10 MG 24 hr tablet TAKE 1 TABLET BY MOUTH AT BEDTIME FOR DIABETES. (Patient taking differently: Take 10 mg by mouth at bedtime. ) 30 tablet 2  . glucose blood (FREESTYLE LITE) test strip Use as instructed to check blood sugar 2 times per day dx code E11.65 100 each 3  . Lancets (FREESTYLE) lancets Use as instructed to check blood sugar 2 times per day dx code E11.65 100 each 3  . lisinopril  (ZESTRIL) 10 MG tablet Take 1 tablet (10 mg total) by mouth daily. 90 tablet 1  . metFORMIN (GLUCOPHAGE) 1000 MG tablet TAKE 1 TABLET BY MOUTH TWICE DAILY FOR BREAKFAST AND DINNER FOR DIABETES. (Patient taking differently: Take 1,000 mg by mouth 2 (two) times daily with a meal. ) 60 tablet 2  . miglitol (GLYSET) 50 MG tablet TAKE 1 TABLET BY MOUTH TWICE DAILY AT BREAKFAST AND DINNER FOR DIABETES. (Patient taking differently: Take 50 mg by mouth 2 (two) times daily. ) 60 tablet 2  . Multiple Vitamins-Minerals (EQ VISION FORMULA 50+) CAPS Take 1 capsule by mouth daily.    . tamsulosin (FLOMAX) 0.4 MG CAPS capsule Take 0.4 mg by mouth 2 (two) times daily.    Marland Kitchen Ubiquinol (QUNOL COQ10/UBIQUINOL/MEGA) 100 MG CAPS Take 1 capsule by mouth daily.    Marland Kitchen VICTOZA 18 MG/3ML SOPN INJECT 1.8MG  INTO THE SKIN DAILY AT DINNERTIME FOR DIABETES. 9 mL 3  . vitamin B-12 (CYANOCOBALAMIN) 1000 MCG tablet Take 1,000 mcg by mouth daily.     No current facility-administered medications for this visit.     Allergies:  Allergies  Allergen Reactions  . Penicillins Anaphylaxis    Has patient had a PCN reaction causing immediate rash, facial/tongue/throat swelling, SOB or lightheadedness with hypotension: Yes Has patient had a PCN reaction causing severe rash involving mucus membranes or skin necrosis: No Has patient had a PCN reaction that required hospitalization: Yes Has patient had a PCN reaction occurring within the last 10 years: Unknown If all of the above answers are "NO", then may proceed with Cephalosporin use.   Marland Kitchen Plavix [Clopidogrel Bisulfate] Hives  . Pletal [Cilostazol] Itching    Past Medical History, Surgical history, Social history, and Family History were reviewed and updated.     Lab Results: Lab Results  Component Value Date   WBC 7.1 11/19/2018   HGB 9.8 (L) 11/19/2018   HCT 32.7 (L) 11/19/2018   MCV 90.6 11/19/2018   PLT 200 11/19/2018     Chemistry      Component Value Date/Time    NA 138 02/11/2019 1011   K 4.5 02/11/2019 1011   CL 108 02/11/2019 1011   CO2 22 02/11/2019 1011   BUN 25 (H) 02/11/2019 1011   CREATININE 1.26 02/11/2019 1011   CREATININE 1.14 08/13/2013 1043      Component Value Date/Time   CALCIUM 9.6 02/11/2019 1011   ALKPHOS 67 11/19/2018 0929   AST 36 11/19/2018 0929   ALT 44 11/19/2018 0929   BILITOT 0.8 11/19/2018 0929       Radiological Studies: 1. LEFT upper lobe lesion, which was hypermetabolic on comparison CT PET-CT, is similar to prior. However, there is new consolidation inferior medial to this lesion which could represent radiation change versus less likely tumor progression. Recommend close attention on follow-up. 2. Elsewhere in the lungs no evidence of  new pulmonary nodularity. 3. Extensive centrilobular emphysema. 4. Small mediastinal lymph nodes are similar to prior.   Impression and Plan:  1.    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  He is currently on active surveillance after surgical resection although he is developing or recurrent disease at this time.  Treatment options at this time were reviewed.  He has been offered localized therapy by dermatology.  The role of Libtayo in this setting was discussed today.  Complications associated with this therapy was reviewed.  These complications include immune mediated issues such as fatigue, tiredness, pneumonitis, colitis among others.  After discussion today, he wants to try at topical therapy as well as conservative approach prior to trying intravenous immune therapy at this time.  We will continue to monitor his situation clinically and reconsider this option in the next 2 to 3 months.    2.  Lung neoplasm: Presented with left upper lung mass diagnosed in February 2020.  He is status post radiation therapy with repeat imaging studies in November 2020 showed no relapsed disease.  I discussed the assessment and  treatment plan with the patient. The patient was provided an opportunity to ask questions and all were answered. The patient agreed with the plan and demonstrated an understanding of the instructions.   The patient was advised to call back or seek an in-person evaluation if the symptoms worsen or if the condition fails to improve as anticipated.  I provided  25 minutes of face-to-face video visit time during this encounter, and > 50% was dedicated to reviewing his imaging studies, disease status update as well as options of treatment.    [name], MD 05/09/2019 2:40 PM

## 2019-05-15 NOTE — Telephone Encounter (Signed)
error 

## 2019-05-22 ENCOUNTER — Encounter: Payer: Self-pay | Admitting: *Deleted

## 2019-05-22 DIAGNOSIS — I2510 Atherosclerotic heart disease of native coronary artery without angina pectoris: Secondary | ICD-10-CM | POA: Diagnosis not present

## 2019-05-22 DIAGNOSIS — C3490 Malignant neoplasm of unspecified part of unspecified bronchus or lung: Secondary | ICD-10-CM | POA: Diagnosis not present

## 2019-05-22 DIAGNOSIS — E7800 Pure hypercholesterolemia, unspecified: Secondary | ICD-10-CM | POA: Diagnosis not present

## 2019-05-22 DIAGNOSIS — E11319 Type 2 diabetes mellitus with unspecified diabetic retinopathy without macular edema: Secondary | ICD-10-CM | POA: Diagnosis not present

## 2019-05-22 DIAGNOSIS — I10 Essential (primary) hypertension: Secondary | ICD-10-CM | POA: Diagnosis not present

## 2019-05-22 DIAGNOSIS — E782 Mixed hyperlipidemia: Secondary | ICD-10-CM | POA: Diagnosis not present

## 2019-05-22 DIAGNOSIS — E1122 Type 2 diabetes mellitus with diabetic chronic kidney disease: Secondary | ICD-10-CM | POA: Diagnosis not present

## 2019-05-22 DIAGNOSIS — C3492 Malignant neoplasm of unspecified part of left bronchus or lung: Secondary | ICD-10-CM | POA: Diagnosis not present

## 2019-05-22 DIAGNOSIS — E7849 Other hyperlipidemia: Secondary | ICD-10-CM | POA: Diagnosis not present

## 2019-05-22 DIAGNOSIS — N401 Enlarged prostate with lower urinary tract symptoms: Secondary | ICD-10-CM | POA: Diagnosis not present

## 2019-05-22 DIAGNOSIS — E1151 Type 2 diabetes mellitus with diabetic peripheral angiopathy without gangrene: Secondary | ICD-10-CM | POA: Diagnosis not present

## 2019-05-22 DIAGNOSIS — N1831 Chronic kidney disease, stage 3a: Secondary | ICD-10-CM | POA: Diagnosis not present

## 2019-05-24 DIAGNOSIS — C44722 Squamous cell carcinoma of skin of right lower limb, including hip: Secondary | ICD-10-CM | POA: Diagnosis not present

## 2019-05-29 DIAGNOSIS — C4441 Basal cell carcinoma of skin of scalp and neck: Secondary | ICD-10-CM | POA: Diagnosis not present

## 2019-05-30 DIAGNOSIS — N401 Enlarged prostate with lower urinary tract symptoms: Secondary | ICD-10-CM | POA: Diagnosis not present

## 2019-05-30 DIAGNOSIS — E1151 Type 2 diabetes mellitus with diabetic peripheral angiopathy without gangrene: Secondary | ICD-10-CM | POA: Diagnosis not present

## 2019-05-30 DIAGNOSIS — N1831 Chronic kidney disease, stage 3a: Secondary | ICD-10-CM | POA: Diagnosis not present

## 2019-05-30 DIAGNOSIS — C3490 Malignant neoplasm of unspecified part of unspecified bronchus or lung: Secondary | ICD-10-CM | POA: Diagnosis not present

## 2019-05-30 DIAGNOSIS — E1122 Type 2 diabetes mellitus with diabetic chronic kidney disease: Secondary | ICD-10-CM | POA: Diagnosis not present

## 2019-05-30 DIAGNOSIS — I2510 Atherosclerotic heart disease of native coronary artery without angina pectoris: Secondary | ICD-10-CM | POA: Diagnosis not present

## 2019-05-30 DIAGNOSIS — E11319 Type 2 diabetes mellitus with unspecified diabetic retinopathy without macular edema: Secondary | ICD-10-CM | POA: Diagnosis not present

## 2019-05-30 DIAGNOSIS — E7800 Pure hypercholesterolemia, unspecified: Secondary | ICD-10-CM | POA: Diagnosis not present

## 2019-05-30 DIAGNOSIS — E782 Mixed hyperlipidemia: Secondary | ICD-10-CM | POA: Diagnosis not present

## 2019-05-30 DIAGNOSIS — E7849 Other hyperlipidemia: Secondary | ICD-10-CM | POA: Diagnosis not present

## 2019-05-30 DIAGNOSIS — I10 Essential (primary) hypertension: Secondary | ICD-10-CM | POA: Diagnosis not present

## 2019-06-10 ENCOUNTER — Other Ambulatory Visit (INDEPENDENT_AMBULATORY_CARE_PROVIDER_SITE_OTHER): Payer: HMO

## 2019-06-10 ENCOUNTER — Other Ambulatory Visit: Payer: Self-pay

## 2019-06-10 DIAGNOSIS — E782 Mixed hyperlipidemia: Secondary | ICD-10-CM

## 2019-06-10 DIAGNOSIS — E1151 Type 2 diabetes mellitus with diabetic peripheral angiopathy without gangrene: Secondary | ICD-10-CM

## 2019-06-10 LAB — COMPREHENSIVE METABOLIC PANEL
ALT: 50 U/L (ref 0–53)
AST: 28 U/L (ref 0–37)
Albumin: 4 g/dL (ref 3.5–5.2)
Alkaline Phosphatase: 73 U/L (ref 39–117)
BUN: 36 mg/dL — ABNORMAL HIGH (ref 6–23)
CO2: 21 mEq/L (ref 19–32)
Calcium: 9.1 mg/dL (ref 8.4–10.5)
Chloride: 110 mEq/L (ref 96–112)
Creatinine, Ser: 1.45 mg/dL (ref 0.40–1.50)
GFR: 47.05 mL/min — ABNORMAL LOW (ref >60.00)
Glucose, Bld: 163 mg/dL — ABNORMAL HIGH (ref 70–99)
Potassium: 4.9 mEq/L (ref 3.5–5.1)
Sodium: 138 mEq/L (ref 135–145)
Total Bilirubin: 0.8 mg/dL (ref 0.2–1.2)
Total Protein: 6.3 g/dL (ref 6.0–8.3)

## 2019-06-10 LAB — LIPID PANEL
Cholesterol: 100 mg/dL (ref 0–200)
HDL: 36.1 mg/dL — ABNORMAL LOW (ref >39.00)
LDL Cholesterol: 40 mg/dL (ref 0–99)
NonHDL: 63.54
Total CHOL/HDL Ratio: 3
Triglycerides: 118 mg/dL (ref 0.0–149.0)
VLDL: 23.6 mg/dL (ref 0.0–40.0)

## 2019-06-10 LAB — HEMOGLOBIN A1C: Hgb A1c MFr Bld: 7.5 % — ABNORMAL HIGH (ref 4.6–6.5)

## 2019-06-11 ENCOUNTER — Other Ambulatory Visit: Payer: Self-pay

## 2019-06-13 ENCOUNTER — Ambulatory Visit: Payer: HMO | Admitting: Endocrinology

## 2019-06-13 ENCOUNTER — Other Ambulatory Visit: Payer: Self-pay

## 2019-06-13 ENCOUNTER — Encounter: Payer: Self-pay | Admitting: Endocrinology

## 2019-06-13 VITALS — BP 110/62 | HR 68 | Ht 74.0 in | Wt 170.0 lb

## 2019-06-13 DIAGNOSIS — E782 Mixed hyperlipidemia: Secondary | ICD-10-CM | POA: Diagnosis not present

## 2019-06-13 DIAGNOSIS — E1151 Type 2 diabetes mellitus with diabetic peripheral angiopathy without gangrene: Secondary | ICD-10-CM | POA: Diagnosis not present

## 2019-06-13 DIAGNOSIS — I10 Essential (primary) hypertension: Secondary | ICD-10-CM | POA: Diagnosis not present

## 2019-06-13 DIAGNOSIS — N182 Chronic kidney disease, stage 2 (mild): Secondary | ICD-10-CM

## 2019-06-13 MED ORDER — LISINOPRIL 5 MG PO TABS: 5.0000 mg | ORAL_TABLET | Freq: Every day | ORAL | 3 refills | Status: DC

## 2019-06-13 NOTE — Progress Notes (Signed)
Patient ID: Mario [name], male   DOB: 1940/06/07, 79 y.o.   MRN: [id number]   Reason for Appointment: Diabetes follow-up   History of Present Illness   Diagnosis: Type 2 DIABETES MELITUS, date of diagnosis:  1989     Previous history: He was initially diagnosed when hospitalized for pancreatitis and his previous endocrinologist had treated him with multiple medications because of progression of his diabetes. He was also put on Cycloset  which he has tolerated maximum dose He has had adjustments of his medication dosages the last couple of years and Januvia was restarted in 5/14 when blood sugars were higher. Dosages have been adjusted based on renal function Previously an A1c levels have been ranging from 6.6-7.5 His metformin was increased  when renal function was better and glipizide was changed to glipizide ER 10 mg . Did not appear to have better blood sugar control with Invokana He was started on Victoza for improved control when his A1c was 7.9% in 02/2015  Recent history:    Non-insulin hypoglycemic drugs:  Glyset 50 mg bid, glipizide ER 10 mg , Metformin 1 g twice a day,   Victoza 1.8 mg daily       His A1c is the same at 7.5, has been as low as 6.7  Blood sugar patterns and problems:  He checks his blood sugar only after his late morning breakfast and not any other time  Frequently eating cereal in the morning as he does not like to cook  Highest blood sugars likely when he goes to eat and sometimes eating sweets  Fasting blood sugar in the lab was 163 but he has not checked in the mornings  He has not been able to exercise because of not going to the gym but only this week is starting to use an exercise bike  His weight is a little higher than on his last measurement  He feels that his sugars are better when he exercises regularly  He was told to take [place] before he starts eating but he still forgets and takes it after eating  Side effects from  medications: None.  Monitors blood glucose: Once a day or less .    Glucometer: Freestyle     Blood sugar checked only after breakfast with a range of 141-212 with average 169   Meals: 2-3 meals per day, no lunch often (yogurt); breakfast is at 9 am cereal, [place] bacon, sometimes bread and eggs at breakfast; donuts occasionally      Dietician consultation last: 3/16        Physical activity: exercise bike     Wt Readings from Last 3 Encounters:  06/13/19 170 lb (77.1 kg)  03/28/19 166 lb 6 oz (75.5 kg)  03/04/19 169 lb 12.8 oz (77 kg)    Lab Results  Component Value Date   HGBA1C 7.5 (H) 06/10/2019   HGBA1C 7.5 (H) 02/11/2019   HGBA1C 6.7 (H) 11/13/2018   Lab Results  Component Value Date   MICROALBUR 9.2 (H) 03/04/2019   LDLCALC 40 06/10/2019   CREATININE 1.45 06/10/2019     LABS:  Lab on 06/10/2019  Component Date Value Ref Range Status  . Cholesterol 06/10/2019 100  0 - 200 mg/dL Final   ATP III Classification       Desirable:  < 200 mg/dL               Borderline High:  200 - 239 mg/dL  High:  > = 240 mg/dL  . Triglycerides 06/10/2019 118.0  0.0 - 149.0 mg/dL Final   Normal:  <150 mg/dLBorderline High:  150 - 199 mg/dL  . HDL 06/10/2019 36.10* >39.00 mg/dL Final  . VLDL 06/10/2019 23.6  0.0 - 40.0 mg/dL Final  . LDL Cholesterol 06/10/2019 40  0 - 99 mg/dL Final  . Total CHOL/HDL Ratio 06/10/2019 3   Final                  Men          Women1/2 Average Risk     3.4          3.3Average Risk          5.0          4.42X Average Risk          9.6          7.13X Average Risk          15.0          11.0                      . NonHDL 06/10/2019 63.54   Final   NOTE:  Non-HDL goal should be 30 mg/dL higher than patient's LDL goal (i.e. LDL goal of < 70 mg/dL, would have non-HDL goal of < 100 mg/dL)  . Sodium 06/10/2019 138  135 - 145 mEq/L Final  . Potassium 06/10/2019 4.9  3.5 - 5.1 mEq/L Final  . Chloride 06/10/2019 110  96 - 112 mEq/L Final  . CO2 06/10/2019  21  19 - 32 mEq/L Final  . Glucose, Bld 06/10/2019 163* 70 - 99 mg/dL Final  . BUN 06/10/2019 36* 6 - 23 mg/dL Final  . Creatinine, Ser 06/10/2019 1.45  0.40 - 1.50 mg/dL Final  . Total Bilirubin 06/10/2019 0.8  0.2 - 1.2 mg/dL Final  . Alkaline Phosphatase 06/10/2019 73  39 - 117 U/L Final  . AST 06/10/2019 28  0 - 37 U/L Final  . ALT 06/10/2019 50  0 - 53 U/L Final  . Total Protein 06/10/2019 6.3  6.0 - 8.3 g/dL Final  . Albumin 06/10/2019 4.0  3.5 - 5.2 g/dL Final  . GFR 06/10/2019 47.05* >60.00 mL/min Final  . Calcium 06/10/2019 9.1  8.4 - 10.5 mg/dL Final  . Hgb A1c MFr Bld 06/10/2019 7.5* 4.6 - 6.5 % Final   Glycemic Control Guidelines for People with Diabetes:Non Diabetic:  <6%Goal of Therapy: <7%Additional Action Suggested:  >8%     Allergies as of 06/13/2019      Reactions   Penicillins Anaphylaxis   Has patient had a PCN reaction causing immediate rash, facial/tongue/throat swelling, SOB or lightheadedness with hypotension: Yes Has patient had a PCN reaction causing severe rash involving mucus membranes or skin necrosis: No Has patient had a PCN reaction that required hospitalization: Yes Has patient had a PCN reaction occurring within the last 10 years: Unknown If all of the above answers are "NO", then may proceed with Cephalosporin use.   Plavix [clopidogrel Bisulfate] Hives   Pletal [cilostazol] Itching      Medication List       Accurate as of June 13, 2019 10:55 AM. If you have any questions, ask your nurse or doctor.        allopurinol 100 MG tablet Commonly known as: ZYLOPRIM Take 1 tablet by mouth daily What changed: when to take this   ARTIFICIAL TEAR OP  Apply 1 drop to eye daily as needed (dry eyes).   aspirin EC 81 MG tablet Take 81 mg by mouth daily.   atorvastatin 40 MG tablet Commonly known as: LIPITOR take 1 tablet by mouth daily at 6:00pm What changed: See the new instructions.   calcium carbonate 500 MG chewable tablet Commonly known  as: TUMS - dosed in mg elemental calcium Chew 1 tablet by mouth daily as needed for indigestion or heartburn.   carvedilol 3.125 MG tablet Commonly known as: COREG TAKE 1 TABLET BY MOUTH TWICE A DAY What changed: when to take this   EQ Vision Formula 50+ Caps Take 1 capsule by mouth daily.   fenofibrate micronized 43 MG capsule Commonly known as: ANTARA Take 43 mg by mouth every evening.   ferrous sulfate 325 (65 FE) MG tablet Take 325 mg by mouth daily.   finasteride 5 MG tablet Commonly known as: PROSCAR Take 5 mg by mouth every evening.   folic acid 599 MCG tablet Commonly known as: FOLVITE Take 800 mcg by mouth daily.   freestyle lancets Use as instructed to check blood sugar 2 times per day dx code E11.65   FreeStyle Lite Devi USE TO TEST BLOOD SUGAR TWICE DAILY   glipiZIDE 10 MG 24 hr tablet Commonly known as: GLUCOTROL XL TAKE 1 TABLET BY MOUTH AT BEDTIME FOR DIABETES. What changed: See the new instructions.   glucose blood test strip Commonly known as: FREESTYLE LITE Use as instructed to check blood sugar 2 times per day dx code E11.65   lisinopril 10 MG tablet Commonly known as: ZESTRIL Take 1 tablet (10 mg total) by mouth daily.   metFORMIN 1000 MG tablet Commonly known as: GLUCOPHAGE TAKE 1 TABLET BY MOUTH TWICE DAILY FOR BREAKFAST AND DINNER FOR DIABETES. What changed: See the new instructions.   miglitol 50 MG tablet Commonly known as: GLYSET TAKE 1 TABLET BY MOUTH TWICE DAILY AT BREAKFAST AND DINNER FOR DIABETES. What changed: See the new instructions.   Qunol CoQ10/Ubiquinol/Mega 100 MG Caps Generic drug: Ubiquinol Take 1 capsule by mouth daily.   tamsulosin 0.4 MG Caps capsule Commonly known as: FLOMAX Take 0.4 mg by mouth 2 (two) times daily.   Victoza 18 MG/3ML Sopn Generic drug: liraglutide INJECT 1.8MG  INTO THE SKIN DAILY AT DINNERTIME FOR DIABETES.   vitamin B-12 1000 MCG tablet Commonly known as: CYANOCOBALAMIN Take 1,000 mcg  by mouth daily.   Vitamin D 50 MCG (2000 UT) Caps Take 2,000 Units by mouth daily.       Allergies:  Allergies  Allergen Reactions  . Penicillins Anaphylaxis    Has patient had a PCN reaction causing immediate rash, facial/tongue/throat swelling, SOB or lightheadedness with hypotension: Yes Has patient had a PCN reaction causing severe rash involving mucus membranes or skin necrosis: No Has patient had a PCN reaction that required hospitalization: Yes Has patient had a PCN reaction occurring within the last 10 years: Unknown If all of the above answers are "NO", then may proceed with Cephalosporin use.   Marland Kitchen Plavix [Clopidogrel Bisulfate] Hives  . Pletal [Cilostazol] Itching    Past Medical History:  Diagnosis Date  . Anemia   . Arthritis   . CAD (coronary artery disease)    a.  s/p IMI and BMS 1997;  b. Myoview 4/16:  inf scar with peri-infarct ischemia, EF 34%; high risk;  c. LHC 5/16:  3 v CAD >> CABG (free L-LAD, S-OM, S-dRCA)  . Cancer ([place])    skin  cancer  . Carotid stenosis    a. carotid US 6/16:  bilat ICA 1-39%  . Chronic kidney disease, stage II (mild)   . Chronic systolic CHF (congestive heart failure) ([place])   . COPD (chronic obstructive pulmonary disease) ([place])   . Essential hypertension, benign   . Gout   . HLD (hyperlipidemia)   . Inguinal adenopathy 09/19/2018  . Ischemic cardiomyopathy    a. EF by myoview 4/16 34%; b. LHC 5/16: EF 40-45%;  c. intraop TEE 6/16: EF 45-50%  . Orthostasis   . PAD (peripheral artery disease) ([facility])    Dr. Fletcher Anon  . Pneumonia    hx of  . Type II or unspecified type diabetes mellitus without mention of complication, uncontrolled     Past Surgical History:  Procedure Laterality Date  . ABDOMINAL AORTAGRAM N/A 10/09/2013   Procedure: ABDOMINAL Maxcine Ham;  Surgeon: Wellington Hampshire, MD;  Location: [facility];  Service: Cardiovascular;  Laterality: N/A;  . ABDOMINAL AORTOGRAM N/A 12/28/2016   Procedure: ABDOMINAL AORTOGRAM;   Surgeon: Waynetta Sandy, MD;  Location: [facility];  Service: Cardiovascular;  Laterality: N/A;  . ABDOMINAL AORTOGRAM W/LOWER EXTREMITY Bilateral 09/11/2018   Procedure: ABDOMINAL AORTOGRAM W/LOWER EXTREMITY;  Surgeon: Serafina Mitchell, MD;  Location: [facility];  Service: Cardiovascular;  Laterality: Bilateral;  . CARDIAC CATHETERIZATION N/A 10/02/2014   Procedure: Left Heart Cath and Coronary Angiography;  Surgeon: Belva Crome, MD;  Location: [facility];  Service: Cardiovascular;  Laterality: N/A;  . CATARACT EXTRACTION    . CHOLECYSTECTOMY    . COLONOSCOPY    . CORONARY ARTERY BYPASS GRAFT N/A 11/03/2014   Procedure: CORONARY ARTERY BYPASS GRAFTING  times three using left internal mammary and right greater saphenous vein;  Surgeon: Gaye Pollack, MD;  Location: [facility];  Service: Open Heart Surgery;  Laterality: N/A;  . FEMORAL-POPLITEAL BYPASS GRAFT Left 09/18/2018   Procedure: LEFT ILEO FEMORAL ENDARTERECTOMY WITH VEIN PATCH AND ANGIOPLASTY, POPLITEAL  ENDARTERECTOMY, FEM POP BYPASS;  Surgeon: Serafina Mitchell, MD;  Location: [facility];  Service: Vascular;  Laterality: Left;  . HERNIA REPAIR    . LOWER EXTREMITY ANGIOGRAPHY Bilateral 12/28/2016   Procedure: Lower Extremity Angiography;  Surgeon: Waynetta Sandy, MD;  Location: [facility];  Service: Cardiovascular;  Laterality: Bilateral;  . LYMPH NODE DISSECTION Right 09/19/2018   Procedure: SUPERFICIAL RIGHT LYMPH NODE DISSECTION;  Surgeon: Fanny Skates, MD;  Location: [facility];  Service: General;  Laterality: Right;  . MELANOMA EXCISION    . PERIPHERAL VASCULAR BALLOON ANGIOPLASTY Right 12/28/2016   Procedure: PERIPHERAL VASCULAR BALLOON ANGIOPLASTY;  Surgeon: Waynetta Sandy, MD;  Location: [facility];  Service: Cardiovascular;  Laterality: Right;  SFA UNSUCCESSFUL PTA  UNABLE TO CROSS LESION  . SKIN LESION EXCISION     multiple  . TEE WITHOUT CARDIOVERSION N/A 11/03/2014    Procedure: TRANSESOPHAGEAL ECHOCARDIOGRAM (TEE);  Surgeon: Gaye Pollack, MD;  Location: [facility];  Service: Open Heart Surgery;  Laterality: N/A;  . VIDEO BRONCHOSCOPY WITH ENDOBRONCHIAL NAVIGATION N/A 11/22/2018   Procedure: VIDEO BRONCHOSCOPY WITH ENDOBRONCHIAL NAVIGATION;  Surgeon: [name], MD;  Location: [facility];  Service: Thoracic;  Laterality: N/A;    Family History  Problem Relation Age of Onset  . Colon cancer Father   . Heart disease Mother   . Non-Hodgkin's lymphoma Daughter     Social History:  reports that he quit smoking about 4 years ago. His smoking  use included cigarettes. He has a 25.00 pack-year smoking history. He has never used smokeless tobacco. He reports that he does not drink alcohol or use drugs.  Review of Systems:   Hypertension:  taking lisinopril 10 mg and carvedilol low-dose No lightheadedness but blood pressure appears to be slightly lower than usual Not monitoring at home  BP Readings from Last 3 Encounters:  06/13/19 110/62  03/28/19 115/67  03/04/19 (!) 134/50   Creatinine is slightly higher than in September  Lab Results  Component Value Date   CREATININE 1.45 06/10/2019   CREATININE 1.26 02/11/2019   CREATININE 1.43 (H) 11/19/2018     Lipids: Well controlled with atorvastatin 40 mg, LDL below 70, as before HDL is still low     Lab Results  Component Value Date   CHOL 100 06/10/2019   HDL 36.10 (L) 06/10/2019   LDLCALC 40 06/10/2019   TRIG 118.0 06/10/2019   CHOLHDL 3 06/10/2019     Has had flu vaccine His last Pneumovax was in 2020  He normally sees a podiatrist but he needs to find a new one for foot care   Examination:   BP 110/62   Pulse 68   Ht 6\' 2"  (1.88 m)   Wt 170 lb (77.1 kg)   BMI 21.83 kg/m   Body mass index is 21.83 kg/m.   No pedal edema  ASSESSMENT/ PLAN:   Diabetes type 2, nonobese:  See history of present illness for detailed discussion of his current management, blood sugar patterns and  problems identified  Blood glucose control is not as good again with A1c 7.5, has been below 7 previously  He is on multiple drugs including Victoza and Metformin  Although his control is likely adequate for his age he can likely do better with diet and exercise modification as discussed above  Also needs to take his miglitol before starting to eat his meal instead of after Discussed adding protein to breakfast such as peanut butter Also will try to check blood sugars after lunch or dinner more consistently and occasionally in the morning also  If his fasting readings continue to increase will need to consider basal insulin .  HYPERTENSION: Blood pressure is low normal and he will cut his lisinopril in half  Lipids: Well-controlled  Foot care: He will try to establish with [facility] especially with his history of vascular disease  There are no Patient Instructions on file for this visit.   [name] 06/13/2019, 10:55 AM

## 2019-06-13 NOTE — Patient Instructions (Addendum)
Less cereal, more protein in am  Take Miglitol when starting to eat meals  Lisinopril 1/2 daily  Check blood sugars on waking up 2-3days a week  Also check blood sugars about 2 hours after meals and do this after different meals by rotation  Recommended blood sugar levels on waking up are 90-130 and about 2 hours after meal is 130-180  Please bring your blood sugar monitor to each visit, thank you

## 2019-06-24 DIAGNOSIS — C44722 Squamous cell carcinoma of skin of right lower limb, including hip: Secondary | ICD-10-CM | POA: Diagnosis not present

## 2019-07-03 DIAGNOSIS — E782 Mixed hyperlipidemia: Secondary | ICD-10-CM | POA: Diagnosis not present

## 2019-07-03 DIAGNOSIS — E1151 Type 2 diabetes mellitus with diabetic peripheral angiopathy without gangrene: Secondary | ICD-10-CM | POA: Diagnosis not present

## 2019-07-03 DIAGNOSIS — E1122 Type 2 diabetes mellitus with diabetic chronic kidney disease: Secondary | ICD-10-CM | POA: Diagnosis not present

## 2019-07-03 DIAGNOSIS — N401 Enlarged prostate with lower urinary tract symptoms: Secondary | ICD-10-CM | POA: Diagnosis not present

## 2019-07-03 DIAGNOSIS — E7800 Pure hypercholesterolemia, unspecified: Secondary | ICD-10-CM | POA: Diagnosis not present

## 2019-07-03 DIAGNOSIS — I10 Essential (primary) hypertension: Secondary | ICD-10-CM | POA: Diagnosis not present

## 2019-07-03 DIAGNOSIS — I2510 Atherosclerotic heart disease of native coronary artery without angina pectoris: Secondary | ICD-10-CM | POA: Diagnosis not present

## 2019-07-03 DIAGNOSIS — N183 Chronic kidney disease, stage 3 unspecified: Secondary | ICD-10-CM | POA: Diagnosis not present

## 2019-07-03 DIAGNOSIS — E11319 Type 2 diabetes mellitus with unspecified diabetic retinopathy without macular edema: Secondary | ICD-10-CM | POA: Diagnosis not present

## 2019-07-03 DIAGNOSIS — C3490 Malignant neoplasm of unspecified part of unspecified bronchus or lung: Secondary | ICD-10-CM | POA: Diagnosis not present

## 2019-07-03 DIAGNOSIS — C3492 Malignant neoplasm of unspecified part of left bronchus or lung: Secondary | ICD-10-CM | POA: Diagnosis not present

## 2019-07-04 ENCOUNTER — Encounter: Payer: Self-pay | Admitting: Radiation Oncology

## 2019-07-04 ENCOUNTER — Other Ambulatory Visit: Payer: Self-pay

## 2019-07-04 ENCOUNTER — Ambulatory Visit
Admission: RE | Admit: 2019-07-04 | Discharge: 2019-07-04 | Disposition: A | Discharge status: Home / Self Care | Payer: HMO | Source: Ambulatory Visit | Attending: Radiation Oncology | Admitting: Radiation Oncology

## 2019-07-04 ENCOUNTER — Telehealth: Payer: Self-pay | Admitting: *Deleted

## 2019-07-04 VITALS — BP 126/84 | HR 90 | Temp 98.5°F | Resp 28 | Wt 163.8 lb

## 2019-07-04 DIAGNOSIS — C3412 Malignant neoplasm of upper lobe, left bronchus or lung: Secondary | ICD-10-CM | POA: Insufficient documentation

## 2019-07-04 DIAGNOSIS — Z79899 Other long term (current) drug therapy: Secondary | ICD-10-CM | POA: Diagnosis not present

## 2019-07-04 DIAGNOSIS — Z7982 Long term (current) use of aspirin: Secondary | ICD-10-CM | POA: Diagnosis not present

## 2019-07-04 DIAGNOSIS — Z85828 Personal history of other malignant neoplasm of skin: Secondary | ICD-10-CM | POA: Diagnosis not present

## 2019-07-04 DIAGNOSIS — Z7984 Long term (current) use of oral hypoglycemic drugs: Secondary | ICD-10-CM | POA: Insufficient documentation

## 2019-07-04 DIAGNOSIS — C44722 Squamous cell carcinoma of skin of right lower limb, including hip: Secondary | ICD-10-CM

## 2019-07-04 DIAGNOSIS — Z923 Personal history of irradiation: Secondary | ICD-10-CM | POA: Insufficient documentation

## 2019-07-04 DIAGNOSIS — R102 Pelvic and perineal pain: Secondary | ICD-10-CM | POA: Insufficient documentation

## 2019-07-04 DIAGNOSIS — C4492 Squamous cell carcinoma of skin, unspecified: Secondary | ICD-10-CM | POA: Diagnosis not present

## 2019-07-04 NOTE — Progress Notes (Signed)
Pt presents today for f/u with Dr. Sondra Come. Pt reports receiving second Covid 19 vaccine yesterday. Pt denies any pain, except usual daily leg cramping in the am. Pt reports frequent cough with occasional clear phlegm. Pt denies hemoptysis. Pt reports SOB with exertion. Pt denies difficulty swallowing, except with large pills  BP 126/84 (BP Location: Left Arm, Patient Position: Sitting, Cuff Size: Normal)   Pulse 90   Temp 98.5 F (36.9 C)   Resp (!) 28   Wt 163 lb 12.8 oz (74.3 kg)   SpO2 100%   BMI 21.03 kg/m   Wt Readings from Last 3 Encounters:  07/04/19 163 lb 12.8 oz (74.3 kg)  06/13/19 170 lb (77.1 kg)  03/28/19 166 lb 6 oz (75.5 kg)   Loma Sousa, RN BSN

## 2019-07-04 NOTE — Patient Instructions (Signed)
Coronavirus (COVID-19) Are you at risk?  Are you at risk for the Coronavirus (COVID-19)?  To be considered HIGH RISK for Coronavirus (COVID-19), you have to meet the following criteria:  . Traveled to [place], [place], [place], [place] or [place]; or in the [place] to [place], [place], [place], or [place]; and have fever, cough, and shortness of breath within the last 2 weeks of travel OR . Been in close contact with a person diagnosed with COVID-19 within the last 2 weeks and have fever, cough, and shortness of breath . IF YOU DO NOT MEET THESE CRITERIA, YOU ARE CONSIDERED LOW RISK FOR COVID-19.  What to do if you are HIGH RISK for COVID-19?  . If you are having a medical emergency, call 911. . Seek medical care right away. Before you go to a doctor's office, urgent care or emergency department, call ahead and tell them about your recent travel, contact with someone diagnosed with COVID-19, and your symptoms. You should receive instructions from your physician's office regarding next steps of care.  . When you arrive at healthcare provider, tell the healthcare staff immediately you have returned from visiting [place], [place], [place], [place] or [place]; or traveled in the [place] to [place], [place], [place], or [place]; in the last two weeks or you have been in close contact with a person diagnosed with COVID-19 in the last 2 weeks.   . Tell the health care staff about your symptoms: fever, cough and shortness of breath. . After you have been seen by a medical provider, you will be either: o Tested for (COVID-19) and discharged home on quarantine except to seek medical care if symptoms worsen, and asked to  - Stay home and avoid contact with others until you get your results (4-5 days)  - Avoid travel on public transportation if possible (such as bus, train, or airplane) or o Sent to the Emergency Department by EMS for evaluation, COVID-19 testing, and possible  admission depending on your condition and test results.  What to do if you are LOW RISK for COVID-19?  Reduce your risk of any infection by using the same precautions used for avoiding the common cold or flu:  . Wash your hands often with soap and warm water for at least 20 seconds.  If soap and water are not readily available, use an alcohol-based hand sanitizer with at least 60% alcohol.  . If coughing or sneezing, cover your mouth and nose by coughing or sneezing into the elbow areas of your shirt or coat, into a tissue or into your sleeve (not your hands). . Avoid shaking hands with others and consider head nods or verbal greetings only. . Avoid touching your eyes, nose, or mouth with unwashed hands.  . Avoid close contact with people who are sick. . Avoid places or events with large numbers of people in one location, like concerts or sporting events. . Carefully consider travel plans you have or are making. . If you are planning any travel outside or inside the [place], visit the CDC's Travelers' Health webpage for the latest health notices. . If you have some symptoms but not all symptoms, continue to monitor at home and seek medical attention if your symptoms worsen. . If you are having a medical emergency, call 911.   ADDITIONAL HEALTHCARE OPTIONS FOR PATIENTS  [facility] / e-Visit: [email or web address]         [facility]: [phone]  [facility]: [phone]                   [facility]: [phone]   

## 2019-07-04 NOTE — Telephone Encounter (Signed)
CALLED PATIENT TO INFORM OF PET SCAN FOR 07-15-19 - ARRIVAL TIME- 1:30 PM @ [facility], PT. TO BE NPO- 6 HRS. PRIOR TO TEST , PATIENT TO AVOID CARBS AND SUGAR THE NIGHT BEFORE TEST, PATIENT TO SEE DR. KINARD ON 07-18-19 @ 2:15 PM FOR RESULTS, SPOKE WITH PATIENT'S WIFE- BARBARA AND SHE IS AWARE OF THESE APPTS.

## 2019-07-04 NOTE — Progress Notes (Signed)
Radiation Oncology         (336) [phone] ________________________________  Name: Mario [name] MRN: [id number]  Date: 07/04/2019  DOB: Jul 25, 1940  Follow-Up Visit Note  CC: [name], MD  [name], *    ICD-10-CM   1. Primary cancer of left upper lobe of lung (HCC)  C34.12 NM PET Image Restag (PS) Skull Base To Thigh  2. 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  Interval Since Last Radiation:  6 months 12/05/2018 - 12/14/2018: LUL / 60 Gy in 5 fractions (SBRT)  Narrative:  The patient returns today for routine follow-up. His most recent chest CT was performed on 04/08/2019 and showed: LUL lesion similar to prior now with radiation change; no new pulmonary nodularity elsewhere in the lungs; small mediastinal lymph nodes similar to prior. I contacted him with these results.  He last spoke with Dr. [name] via video visit on 05/09/2019. He is also under the care of Dr. [name] for recurrent skin cancer and is currently using a topical treatment.  On review of systems, he denies any significant pain in the chest or hemoptysis.  He received his second dose of COVID-19 vaccine yesterday and thus far is tolerating this shot well.  The patient has noticed some pain in the right inguinal area.  Of note he did undergo superficial inguinal node dissection by Dr. Fanny Skates prior to his lung cancer treatment.  He denies any significant swelling in his right lower leg.   ALLERGIES:  is allergic to penicillins; plavix [clopidogrel bisulfate]; and pletal [cilostazol].  Meds: Current Outpatient Medications  Medication Sig Dispense Refill  . allopurinol (ZYLOPRIM) 100 MG tablet Take 1  tablet by mouth daily (Patient taking differently: Take 100 mg by mouth at bedtime. ) 90 tablet 0  . ARTIFICIAL TEAR OP Apply 1 drop to eye daily as needed (dry eyes).    Marland Kitchen aspirin EC 81 MG tablet Take 81 mg by mouth daily.    Marland Kitchen atorvastatin (LIPITOR) 40 MG tablet take 1 tablet by mouth daily at 6:00pm  (Patient taking differently: Take 40 mg by mouth at bedtime. ) 90 tablet 0  . Blood Glucose Monitoring Suppl (FREESTYLE LITE) DEVI USE TO TEST BLOOD SUGAR TWICE DAILY 1 each 1  . calcium carbonate (TUMS - DOSED IN MG ELEMENTAL CALCIUM) 500 MG chewable tablet Chew 1 tablet by mouth daily as needed for indigestion or heartburn.    . carvedilol (COREG) 3.125 MG tablet TAKE 1 TABLET BY MOUTH TWICE A DAY (Patient taking differently: Take 3.125 mg by mouth 2 (two) times daily with a meal. ) 180 tablet 3  . Cholecalciferol (VITAMIN D) 2000 units CAPS Take 2,000 Units by mouth daily.    . fenofibrate micronized (ANTARA) 43 MG capsule Take 43 mg by mouth every evening.    . ferrous sulfate 325 (65 FE) MG tablet Take 325 mg by mouth daily.    . finasteride (PROSCAR) 5 MG tablet Take 5 mg by mouth every evening.   3  . folic acid (FOLVITE) 810 MCG tablet Take 800 mcg by mouth daily.     Marland Kitchen glipiZIDE (GLUCOTROL XL) 10 MG 24 hr tablet TAKE 1 TABLET BY MOUTH AT BEDTIME FOR DIABETES. (Patient taking differently:  Take 10 mg by mouth at bedtime. ) 30 tablet 2  . glucose blood (FREESTYLE LITE) test strip Use as instructed to check blood sugar 2 times per day dx code E11.65 100 each 3  . Lancets (FREESTYLE) lancets Use as instructed to check blood sugar 2 times per day dx code E11.65 100 each 3  . lisinopril (ZESTRIL) 5 MG tablet Take 1 tablet (5 mg total) by mouth daily. 90 tablet 3  . metFORMIN (GLUCOPHAGE) 1000 MG tablet TAKE 1 TABLET BY MOUTH TWICE DAILY FOR BREAKFAST AND DINNER FOR DIABETES. (Patient taking differently: Take 1,000 mg by mouth 2 (two) times daily with a meal. ) 60 tablet 2  . miglitol (GLYSET) 50 MG  tablet TAKE 1 TABLET BY MOUTH TWICE DAILY AT BREAKFAST AND DINNER FOR DIABETES. (Patient taking differently: Take 50 mg by mouth 2 (two) times daily. ) 60 tablet 2  . Multiple Vitamins-Minerals (EQ VISION FORMULA 50+) CAPS Take 1 capsule by mouth daily.    . tamsulosin (FLOMAX) 0.4 MG CAPS capsule Take 0.4 mg by mouth 2 (two) times daily.    Marland Kitchen Ubiquinol (QUNOL COQ10/UBIQUINOL/MEGA) 100 MG CAPS Take 1 capsule by mouth daily.    Marland Kitchen VICTOZA 18 MG/3ML SOPN INJECT 1.8MG  INTO THE SKIN DAILY AT DINNERTIME FOR DIABETES. 9 mL 3  . vitamin B-12 (CYANOCOBALAMIN) 1000 MCG tablet Take 1,000 mcg by mouth daily.     No current facility-administered medications for this encounter.    Physical Findings: The patient is in no acute distress. Patient is alert and oriented.  weight is 163 lb 12.8 oz (74.3 kg). His temperature is 98.5 F (36.9 C). His blood pressure is 126/84 and his pulse is 90. His respiration is 28 (abnormal) and oxygen saturation is 100%. .   Lungs are clear to auscultation bilaterally. Heart has regular rate and rhythm. No palpable cervical, supraclavicular, or axillary adenopathy. Abdomen soft, non-tender, normal bowel sounds.  Patient has some swelling along the inferior aspect of his inguinal scar.  With standing and coughing patient is showing signs of possible hernia.  This is easily reducible at this time.  No obvious palpable lymph nodes in the right inguinal/femoral area.  Lab Findings: Lab Results  Component Value Date   WBC 7.1 11/19/2018   HGB 9.8 (L) 11/19/2018   HCT 32.7 (L) 11/19/2018   MCV 90.6 11/19/2018   PLT 200 11/19/2018    Radiographic Findings: No results found.  Impression:  ClinicalStage IIb (T3a, N0, M0)non-small cell lung cancer presenting in the leftupper lung area (squamous cell).  It is now time to proceed with repeat imaging of his chest area.  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  If imaging shows no evidence of cancer then he will be referred back to general surgery in the event that he may be developing a hernia requiring surgical intervention.    Plan:  Patient will return  after completion of his PET scan for further review.  Total time spent in this encounter was 28 minutes which included review of the patient's previous pathologies chronic medical record, x-ray images as well as his radiation therapy treatment records and it relates to imaging and placement of upcoming PET scan scan orders and documentation.  ____________________________________ [name],  MD   This document serves as a record of services personally performed by [name], MD. It was created on his behalf by Wilburn Mylar, a trained medical scribe. The creation of this record is based on the scribe's personal observations and the provider's statements to them. This document has been checked and approved by the attending provider.

## 2019-07-10 IMAGING — CR DG FOOT COMPLETE 3+V*L*
3 series · 3 of 3 positions shown · non-contrast
Comparison: None.

CLINICAL DATA: Ulcer in left second toe.

EXAM:
LEFT FOOT - COMPLETE 3+ VIEW

[t foot ap left]
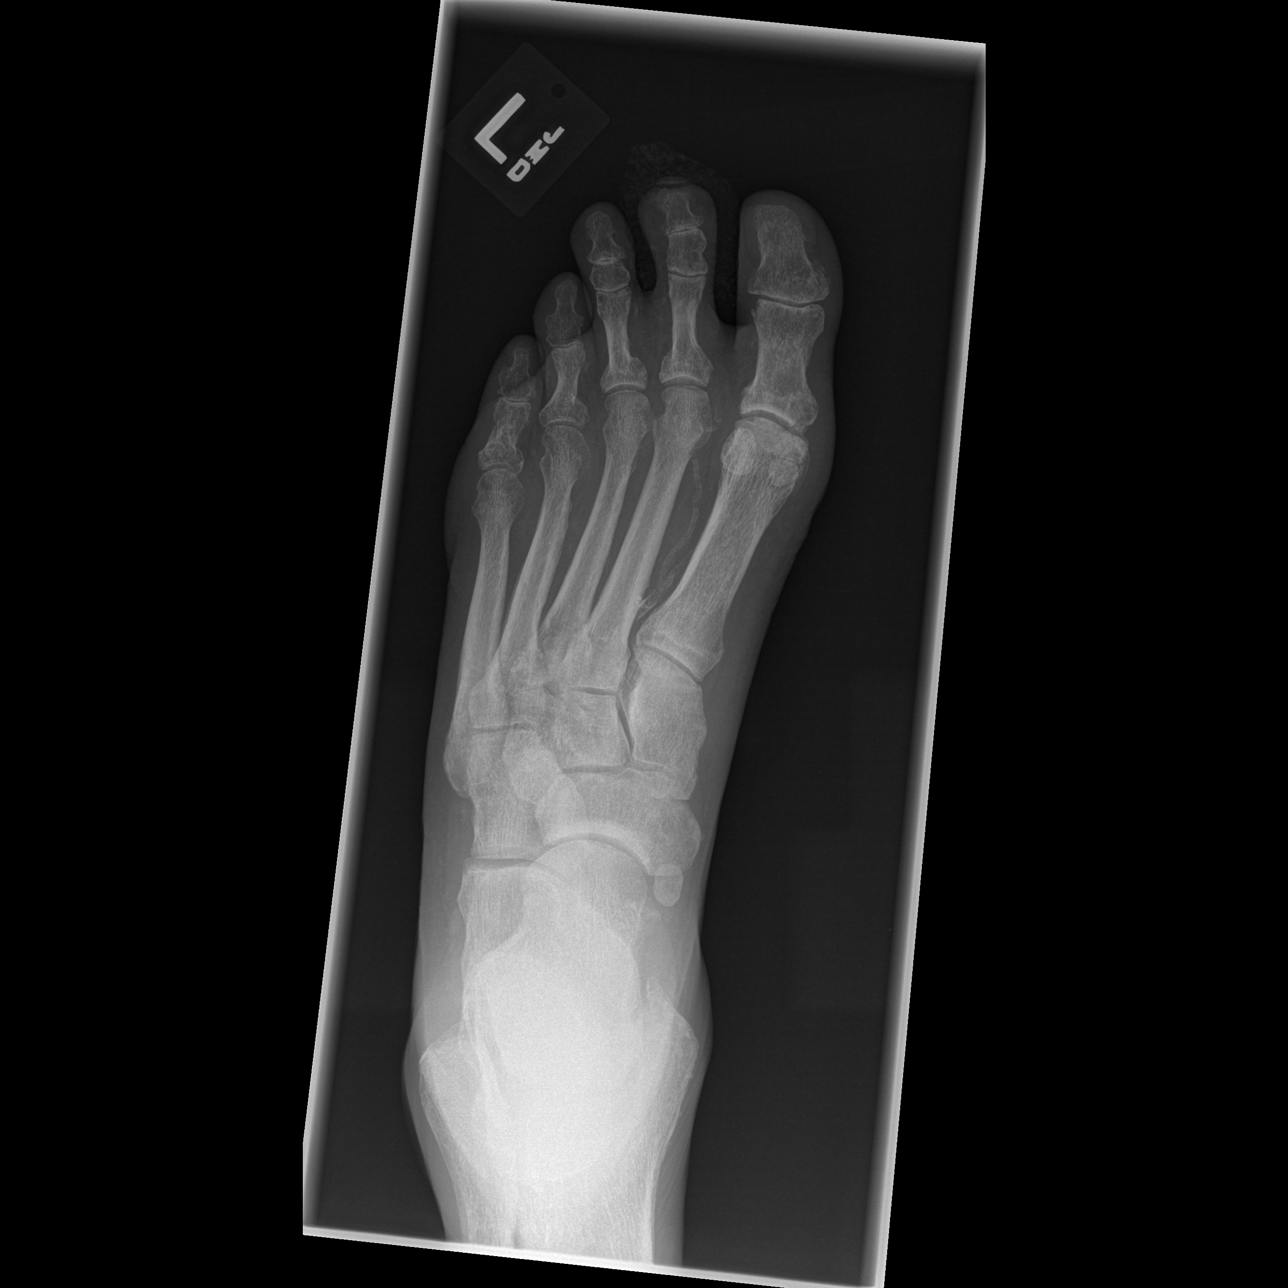

[t foot oblique left]
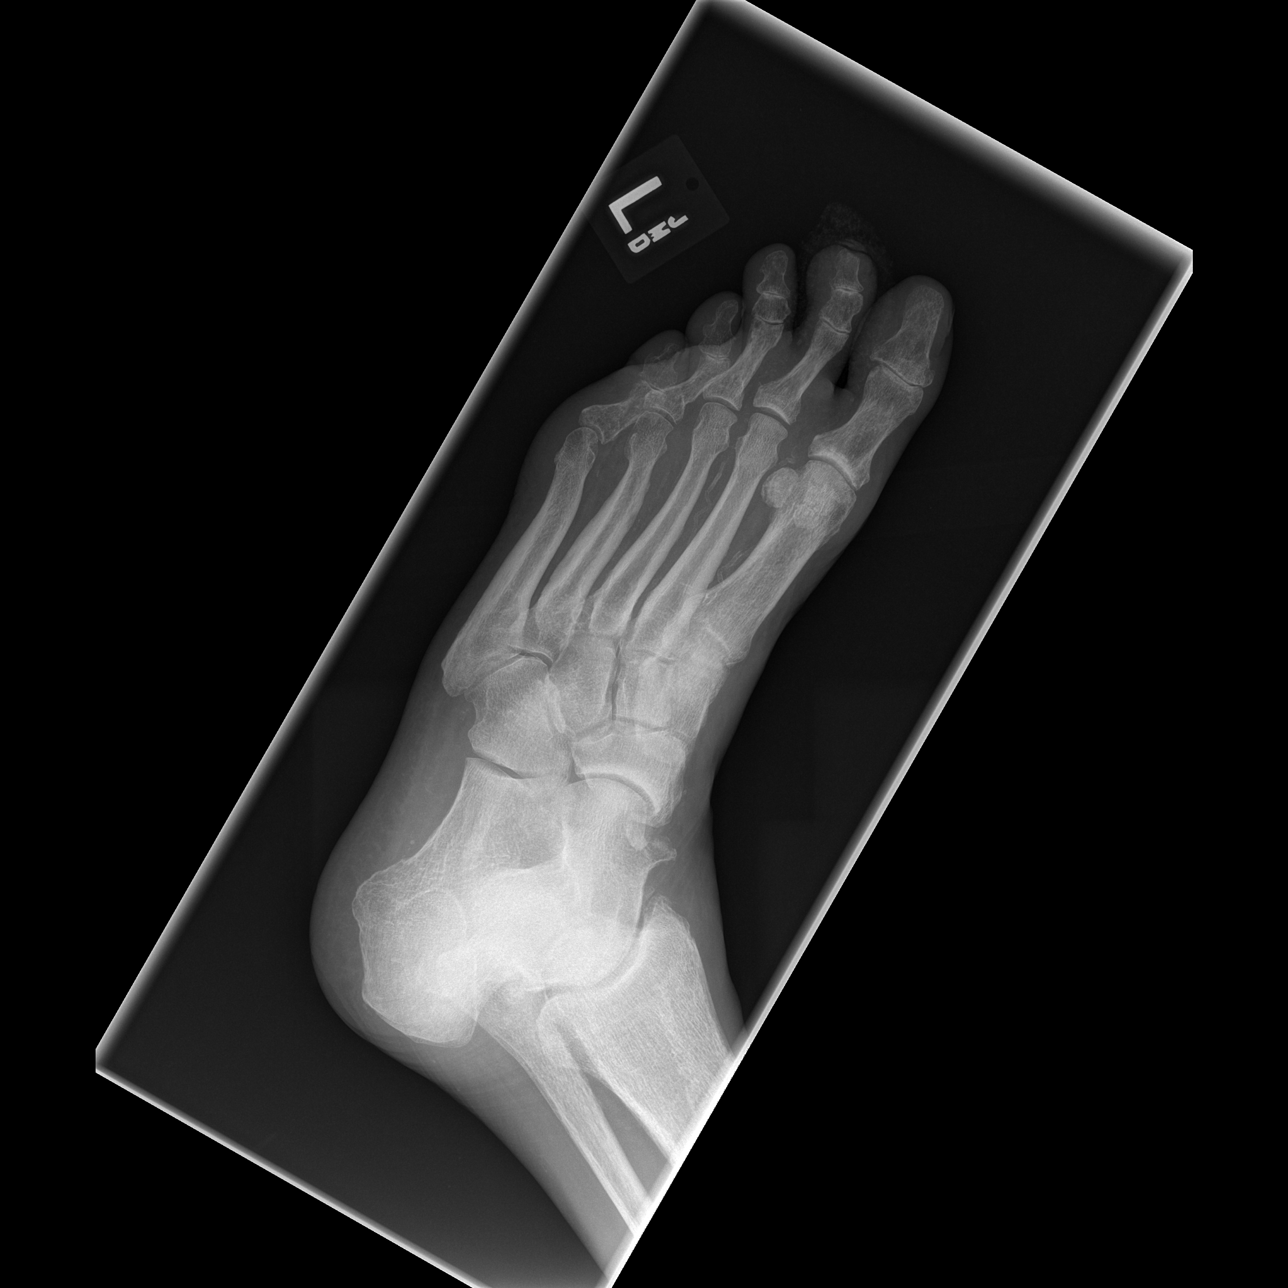

[t foot lat left]
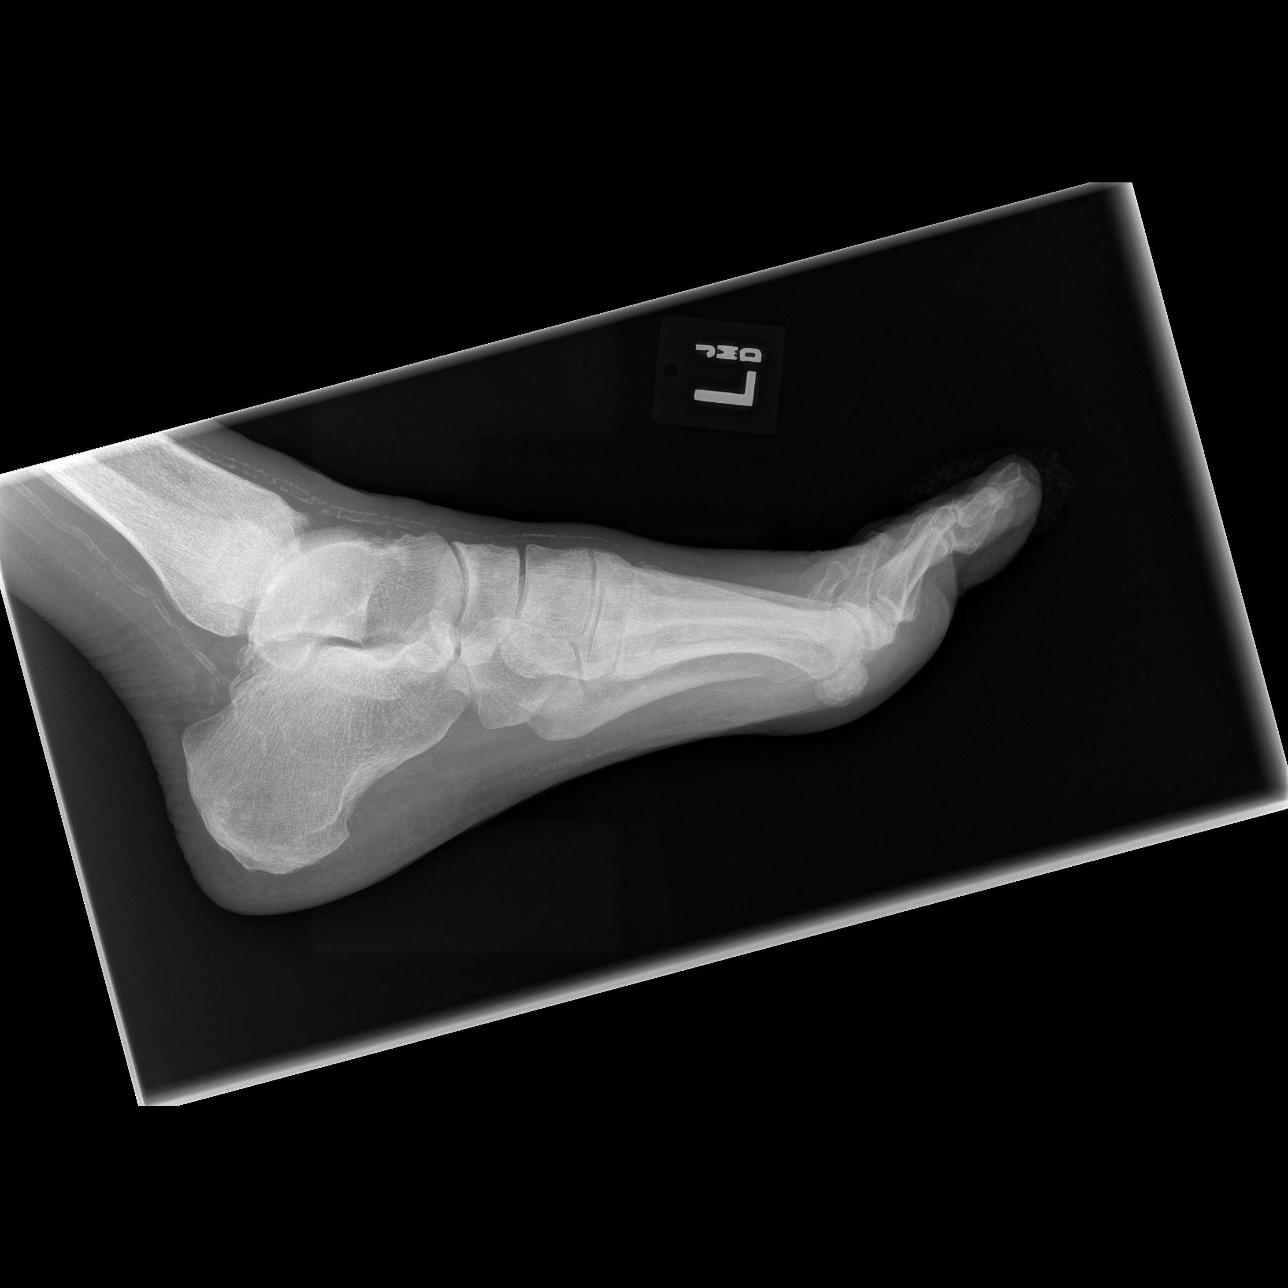

[3 of 3 positions shown; findings below may reference images not displayed]

FINDINGS: Vascular calcifications. No acute fractures. Erosive change in the
tuft of the distal second phalanx. There is an overlying skin
defect.
IMPRESSION: Erosive change in the distal tuft of the second toe associated with
an overlying skin defect/ulcer. The findings are consistent with
osteomyelitis.

## 2019-07-15 ENCOUNTER — Other Ambulatory Visit: Payer: Self-pay

## 2019-07-15 ENCOUNTER — Encounter (HOSPITAL_COMMUNITY)
Admission: RE | Admit: 2019-07-15 | Discharge: 2019-07-15 | Disposition: A | Discharge status: Home / Self Care | Payer: HMO | Source: Ambulatory Visit | Attending: Radiation Oncology | Admitting: Radiation Oncology

## 2019-07-15 DIAGNOSIS — C44722 Squamous cell carcinoma of skin of right lower limb, including hip: Secondary | ICD-10-CM | POA: Diagnosis not present

## 2019-07-15 DIAGNOSIS — I2510 Atherosclerotic heart disease of native coronary artery without angina pectoris: Secondary | ICD-10-CM | POA: Insufficient documentation

## 2019-07-15 DIAGNOSIS — R102 Pelvic and perineal pain: Secondary | ICD-10-CM | POA: Diagnosis not present

## 2019-07-15 DIAGNOSIS — I700 Atherosclerosis of aorta: Secondary | ICD-10-CM | POA: Diagnosis not present

## 2019-07-15 DIAGNOSIS — C3412 Malignant neoplasm of upper lobe, left bronchus or lung: Secondary | ICD-10-CM | POA: Diagnosis not present

## 2019-07-15 DIAGNOSIS — Z951 Presence of aortocoronary bypass graft: Secondary | ICD-10-CM | POA: Insufficient documentation

## 2019-07-15 LAB — GLUCOSE, CAPILLARY: Glucose-Capillary: 206 mg/dL — ABNORMAL HIGH (ref 70–99)

## 2019-07-15 MED ORDER — FLUDEOXYGLUCOSE F - 18 (FDG) INJECTION
8.1300 | Freq: Once | INTRAVENOUS | Status: AC | PRN
  Administered 2019-07-15: 8.13 via INTRAVENOUS

## 2019-07-18 ENCOUNTER — Ambulatory Visit
Admission: RE | Admit: 2019-07-18 | Discharge: 2019-07-18 | Disposition: A | Discharge status: Home / Self Care | Payer: HMO | Source: Ambulatory Visit | Attending: Radiation Oncology | Admitting: Radiation Oncology

## 2019-07-18 ENCOUNTER — Encounter: Payer: Self-pay | Admitting: Radiation Oncology

## 2019-07-18 ENCOUNTER — Other Ambulatory Visit: Payer: Self-pay

## 2019-07-18 VITALS — BP 164/78 | HR 72 | Temp 98.3°F | Resp 20 | Wt 170.8 lb

## 2019-07-18 DIAGNOSIS — Z79899 Other long term (current) drug therapy: Secondary | ICD-10-CM | POA: Insufficient documentation

## 2019-07-18 DIAGNOSIS — Z7982 Long term (current) use of aspirin: Secondary | ICD-10-CM | POA: Diagnosis not present

## 2019-07-18 DIAGNOSIS — Z923 Personal history of irradiation: Secondary | ICD-10-CM | POA: Diagnosis not present

## 2019-07-18 DIAGNOSIS — Z7984 Long term (current) use of oral hypoglycemic drugs: Secondary | ICD-10-CM | POA: Insufficient documentation

## 2019-07-18 DIAGNOSIS — Z85118 Personal history of other malignant neoplasm of bronchus and lung: Secondary | ICD-10-CM | POA: Diagnosis not present

## 2019-07-18 DIAGNOSIS — C3412 Malignant neoplasm of upper lobe, left bronchus or lung: Secondary | ICD-10-CM | POA: Insufficient documentation

## 2019-07-18 DIAGNOSIS — Z08 Encounter for follow-up examination after completed treatment for malignant neoplasm: Secondary | ICD-10-CM | POA: Diagnosis not present

## 2019-07-18 NOTE — Patient Instructions (Signed)
Coronavirus (COVID-19) Are you at risk?  Are you at risk for the Coronavirus (COVID-19)?  To be considered HIGH RISK for Coronavirus (COVID-19), you have to meet the following criteria:  . Traveled to [place], [place], [place], [place] or [place]; or in the [place] to [place], [place], [place], or [place]; and have fever, cough, and shortness of breath within the last 2 weeks of travel OR . Been in close contact with a person diagnosed with COVID-19 within the last 2 weeks and have fever, cough, and shortness of breath . IF YOU DO NOT MEET THESE CRITERIA, YOU ARE CONSIDERED LOW RISK FOR COVID-19.  What to do if you are HIGH RISK for COVID-19?  . If you are having a medical emergency, call 911. . Seek medical care right away. Before you go to a doctor's office, urgent care or emergency department, call ahead and tell them about your recent travel, contact with someone diagnosed with COVID-19, and your symptoms. You should receive instructions from your physician's office regarding next steps of care.  . When you arrive at healthcare provider, tell the healthcare staff immediately you have returned from visiting [place], [place], [place], [place] or [place]; or traveled in the [place] to [place], [place], [place], or [place]; in the last two weeks or you have been in close contact with a person diagnosed with COVID-19 in the last 2 weeks.   . Tell the health care staff about your symptoms: fever, cough and shortness of breath. . After you have been seen by a medical provider, you will be either: o Tested for (COVID-19) and discharged home on quarantine except to seek medical care if symptoms worsen, and asked to  - Stay home and avoid contact with others until you get your results (4-5 days)  - Avoid travel on public transportation if possible (such as bus, train, or airplane) or o Sent to the Emergency Department by EMS for evaluation, COVID-19 testing, and possible  admission depending on your condition and test results.  What to do if you are LOW RISK for COVID-19?  Reduce your risk of any infection by using the same precautions used for avoiding the common cold or flu:  . Wash your hands often with soap and warm water for at least 20 seconds.  If soap and water are not readily available, use an alcohol-based hand sanitizer with at least 60% alcohol.  . If coughing or sneezing, cover your mouth and nose by coughing or sneezing into the elbow areas of your shirt or coat, into a tissue or into your sleeve (not your hands). . Avoid shaking hands with others and consider head nods or verbal greetings only. . Avoid touching your eyes, nose, or mouth with unwashed hands.  . Avoid close contact with people who are sick. . Avoid places or events with large numbers of people in one location, like concerts or sporting events. . Carefully consider travel plans you have or are making. . If you are planning any travel outside or inside the [place], visit the CDC's Travelers' Health webpage for the latest health notices. . If you have some symptoms but not all symptoms, continue to monitor at home and seek medical attention if your symptoms worsen. . If you are having a medical emergency, call 911.   ADDITIONAL HEALTHCARE OPTIONS FOR PATIENTS  [facility] / e-Visit: [email or web address]         [facility]: [phone]  [facility]: [phone]                   [facility]: [phone]   

## 2019-07-18 NOTE — Progress Notes (Addendum)
Patient in for results of PET Scan he was seen on 07/04/2019 he denies any changes at this time.

## 2019-07-18 NOTE — Progress Notes (Signed)
Radiation Oncology         (336) [phone] ________________________________  Name: Mario [name] MRN: [id number]  Date: 07/18/2019  DOB: 08/07/1940  Follow-Up Visit Note  CC: [name], MD  [name], *    ICD-10-CM   1. 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  Interval Since Last Radiation:  6 months 12/05/2018 - 12/14/2018: LUL / 60 Gy in 5 fractions (SBRT)  Narrative:  The patient returns today for routine follow-up and to review his recent scan results.  He underwent PET scan on 07/15/2019, which revealed: resolution of hypermetabolic LUL mass; residual thickening in medial LUL favored benign post radiation inflammation; no evidence of metastatic mediastinal adenopathy or distant metastatic disease.  On review of systems, he reports very mild intermittent pain in the right pelvis region at this time.  I did review the patient's CT scan portion of his PET scan which does show early hernia along the right side.  Given the patient's minimal pain he does not wish to proceed with surgical evaluation of this issue at this time.  I reviewed the patient's PET scan findings with him and detail and he is happy to have good result.   ALLERGIES:  is allergic to penicillins; plavix [clopidogrel bisulfate]; and pletal [cilostazol].  Meds: Current Outpatient Medications  Medication Sig Dispense Refill  . allopurinol (ZYLOPRIM) 100 MG tablet Take 1 tablet by mouth daily (Patient taking differently: Take 100 mg by mouth at bedtime. ) 90 tablet 0  . ARTIFICIAL TEAR OP Apply 1 drop to eye daily as needed (dry eyes).    Marland Kitchen aspirin EC 81 MG tablet Take 81 mg by mouth daily.    Marland Kitchen atorvastatin (LIPITOR) 40 MG  tablet take 1 tablet by mouth daily at 6:00pm  (Patient taking differently: Take 40 mg by mouth at bedtime. ) 90 tablet 0  . Blood Glucose Monitoring Suppl (FREESTYLE LITE) DEVI USE TO TEST BLOOD SUGAR TWICE DAILY 1 each 1  . calcium carbonate (TUMS - DOSED IN MG ELEMENTAL CALCIUM) 500 MG chewable tablet Chew 1 tablet by mouth daily as needed for indigestion or heartburn.    . carvedilol (COREG) 3.125 MG tablet TAKE 1 TABLET BY MOUTH TWICE A DAY (Patient taking differently: Take 3.125 mg by mouth 2 (two) times daily with a meal. ) 180 tablet 3  . Cholecalciferol (VITAMIN D) 2000 units CAPS Take 2,000 Units by mouth daily.    . fenofibrate micronized (ANTARA) 43 MG capsule Take 43 mg by mouth every evening.    . ferrous sulfate 325 (65 FE) MG tablet Take 325 mg by mouth daily.    . finasteride (PROSCAR) 5 MG tablet Take 5 mg by mouth every evening.   3  . folic acid (FOLVITE) 607 MCG tablet Take 800 mcg by mouth daily.     Marland Kitchen glipiZIDE (GLUCOTROL XL) 10 MG 24 hr tablet TAKE 1 TABLET BY MOUTH AT BEDTIME FOR DIABETES. (Patient taking differently: Take 10 mg by mouth at bedtime. ) 30 tablet 2  . glucose blood (FREESTYLE LITE) test strip Use as instructed to check blood sugar 2 times per day dx code E11.65 100 each 3  . Lancets (FREESTYLE) lancets Use as instructed to check blood sugar 2 times  per day dx code E11.65 100 each 3  . lisinopril (ZESTRIL) 5 MG tablet Take 1 tablet (5 mg total) by mouth daily. 90 tablet 3  . metFORMIN (GLUCOPHAGE) 1000 MG tablet TAKE 1 TABLET BY MOUTH TWICE DAILY FOR BREAKFAST AND DINNER FOR DIABETES. (Patient taking differently: Take 1,000 mg by mouth 2 (two) times daily with a meal. ) 60 tablet 2  . miglitol (GLYSET) 50 MG tablet TAKE 1 TABLET BY MOUTH TWICE DAILY AT BREAKFAST AND DINNER FOR DIABETES. (Patient taking differently: Take 50 mg by mouth 2 (two) times daily. ) 60 tablet 2  . Multiple Vitamins-Minerals (EQ VISION FORMULA 50+) CAPS Take 1 capsule by mouth daily.    .  tamsulosin (FLOMAX) 0.4 MG CAPS capsule Take 0.4 mg by mouth 2 (two) times daily.    Marland Kitchen Ubiquinol (QUNOL COQ10/UBIQUINOL/MEGA) 100 MG CAPS Take 1 capsule by mouth daily.    Marland Kitchen VICTOZA 18 MG/3ML SOPN INJECT 1.8MG  INTO THE SKIN DAILY AT DINNERTIME FOR DIABETES. 9 mL 3  . vitamin B-12 (CYANOCOBALAMIN) 1000 MCG tablet Take 1,000 mcg by mouth daily.     No current facility-administered medications for this encounter.    Physical Findings: The patient is in no acute distress. Patient is alert and oriented.  weight is 170 lb 12.8 oz (77.5 kg). His temperature is 98.3 F (36.8 C). His blood pressure is 164/78 (abnormal) and his pulse is 72. His respiration is 20 and oxygen saturation is 100%. .   Lungs are clear to auscultation bilaterally. Heart has regular rate and rhythm. No palpable cervical, supraclavicular, or axillary adenopathy. Abdomen soft, non-tender, normal bowel sounds.      Lab Findings: Lab Results  Component Value Date   WBC 7.1 11/19/2018   HGB 9.8 (L) 11/19/2018   HCT 32.7 (L) 11/19/2018   MCV 90.6 11/19/2018   PLT 200 11/19/2018    Radiographic Findings: NM PET Image Restag (PS) Skull Base To Thigh  Result Date: 07/15/2019 CLINICAL DATA:  Subsequent treatment strategy for non-small cell lung cancer. Squamous cell carcinoma of the LEFT upper lung. Status post SB RT. EXAM: NUCLEAR MEDICINE PET SKULL BASE TO THIGH TECHNIQUE: 8.1 mCi F-18 FDG was injected intravenously. Full-ring PET imaging was performed from the skull base to thigh after the radiotracer. CT data was obtained and used for attenuation correction and anatomic localization. Fasting blood glucose: 206 mg/dl COMPARISON:  None. FINDINGS: Mediastinal blood pool activity: SUV max 2.4 Liver activity: SUV max 3.0 NECK: No hypermetabolic lymph nodes in the neck. Incidental CT findings: none CHEST: Interval reduction in size and metabolic activity of the LEFT upper lobe mass. Previously intensely hypermetabolic triangular mass  with SUV max equal 11.3. Currently there is only a band of pleural-parenchymal thickening in the medial LEFT upper lobe with SUV max equal 3.1. No hypermetabolic mediastinal lymph nodes. No hypermetabolic nodules or larger nodules in RIGHT lung Incidental CT findings: Extensive coronary calcification, bowel edema calcification in aortic calcification. Post CABG ABDOMEN/PELVIS: Adrenal glands normal. No visceral metastasis. No hypermetabolic abdominopelvic lymph nodes. Incidental CT findings: Diverticulosis of LEFT colon. Atherosclerotic calcification of the aorta. Small amount free fluid in the RIGHT pelvis SKELETON: No focal hypermetabolic activity to suggest skeletal metastasis. Incidental CT findings: none IMPRESSION: 1. Resolution of hypermetabolic LEFT upper lobe mass. Residual band of pleural-parenchymal thickening in the medial LEFT upper lobe with mild metabolic activity is favored benign post radiation inflammation. 2. No evidence of metastatic mediastinal adenopathy. 3. No evidence distant metastatic disease. 4. Extensive arterial  vascular calcifications.  Post CABG. Electronically Signed   By: Suzy Bouchard M.D.   On: 07/15/2019 17:13    Impression:  ClinicalStage IIb (T3a, N0, M0)non-small cell lung cancer presenting in the leftupper lung area (squamous cell).    Recent chest CT scan shows no active disease or new problems outside the chest.    Plan:  Patient will return in 6 months for routine follow-up.  Prior to this visit I will order a chest CT scan.    ____________________________________ [name], MD   This document serves as a record of services personally performed by [name], MD. It was created on his behalf by Wilburn Mylar, a trained medical scribe. The creation of this record is based on the scribe's personal observations and the provider's statements to them. This document has been checked and approved by the attending provider.

## 2019-07-22 DIAGNOSIS — C44722 Squamous cell carcinoma of skin of right lower limb, including hip: Secondary | ICD-10-CM | POA: Diagnosis not present

## 2019-07-23 ENCOUNTER — Other Ambulatory Visit: Payer: Self-pay | Admitting: Endocrinology

## 2019-08-07 DIAGNOSIS — N183 Chronic kidney disease, stage 3 unspecified: Secondary | ICD-10-CM | POA: Diagnosis not present

## 2019-08-07 DIAGNOSIS — I10 Essential (primary) hypertension: Secondary | ICD-10-CM | POA: Diagnosis not present

## 2019-08-07 DIAGNOSIS — N401 Enlarged prostate with lower urinary tract symptoms: Secondary | ICD-10-CM | POA: Diagnosis not present

## 2019-08-07 DIAGNOSIS — E1151 Type 2 diabetes mellitus with diabetic peripheral angiopathy without gangrene: Secondary | ICD-10-CM | POA: Diagnosis not present

## 2019-08-07 DIAGNOSIS — C3490 Malignant neoplasm of unspecified part of unspecified bronchus or lung: Secondary | ICD-10-CM | POA: Diagnosis not present

## 2019-08-07 DIAGNOSIS — E1122 Type 2 diabetes mellitus with diabetic chronic kidney disease: Secondary | ICD-10-CM | POA: Diagnosis not present

## 2019-08-07 DIAGNOSIS — E7849 Other hyperlipidemia: Secondary | ICD-10-CM | POA: Diagnosis not present

## 2019-08-07 DIAGNOSIS — E782 Mixed hyperlipidemia: Secondary | ICD-10-CM | POA: Diagnosis not present

## 2019-08-07 DIAGNOSIS — E7800 Pure hypercholesterolemia, unspecified: Secondary | ICD-10-CM | POA: Diagnosis not present

## 2019-08-07 DIAGNOSIS — I2510 Atherosclerotic heart disease of native coronary artery without angina pectoris: Secondary | ICD-10-CM | POA: Diagnosis not present

## 2019-08-07 DIAGNOSIS — E11319 Type 2 diabetes mellitus with unspecified diabetic retinopathy without macular edema: Secondary | ICD-10-CM | POA: Diagnosis not present

## 2019-08-22 DIAGNOSIS — C44722 Squamous cell carcinoma of skin of right lower limb, including hip: Secondary | ICD-10-CM | POA: Diagnosis not present

## 2019-09-09 ENCOUNTER — Other Ambulatory Visit: Payer: Self-pay

## 2019-09-09 ENCOUNTER — Other Ambulatory Visit (INDEPENDENT_AMBULATORY_CARE_PROVIDER_SITE_OTHER): Payer: HMO

## 2019-09-09 DIAGNOSIS — E1151 Type 2 diabetes mellitus with diabetic peripheral angiopathy without gangrene: Secondary | ICD-10-CM

## 2019-09-09 DIAGNOSIS — E782 Mixed hyperlipidemia: Secondary | ICD-10-CM | POA: Diagnosis not present

## 2019-09-09 LAB — BASIC METABOLIC PANEL
BUN: 27 mg/dL — ABNORMAL HIGH (ref 6–23)
CO2: 21 mEq/L (ref 19–32)
Calcium: 8.7 mg/dL (ref 8.4–10.5)
Chloride: 109 mEq/L (ref 96–112)
Creatinine, Ser: 1.31 mg/dL (ref 0.40–1.50)
GFR: 52.86 mL/min — ABNORMAL LOW (ref >60.00)
Glucose, Bld: 138 mg/dL — ABNORMAL HIGH (ref 70–99)
Potassium: 4.5 mEq/L (ref 3.5–5.1)
Sodium: 137 mEq/L (ref 135–145)

## 2019-09-09 LAB — HEMOGLOBIN A1C: Hgb A1c MFr Bld: 7.8 % — ABNORMAL HIGH (ref 4.6–6.5)

## 2019-09-09 LAB — TSH: TSH: 3.12 u[IU]/mL (ref 0.35–4.50)

## 2019-09-12 ENCOUNTER — Ambulatory Visit: Payer: HMO | Admitting: Endocrinology

## 2019-09-12 ENCOUNTER — Other Ambulatory Visit: Payer: Self-pay

## 2019-09-12 ENCOUNTER — Encounter: Payer: Self-pay | Admitting: Endocrinology

## 2019-09-12 VITALS — BP 142/70 | HR 76 | Ht 74.0 in | Wt 164.8 lb

## 2019-09-12 DIAGNOSIS — E1151 Type 2 diabetes mellitus with diabetic peripheral angiopathy without gangrene: Secondary | ICD-10-CM

## 2019-09-12 DIAGNOSIS — E1165 Type 2 diabetes mellitus with hyperglycemia: Secondary | ICD-10-CM | POA: Diagnosis not present

## 2019-09-12 DIAGNOSIS — N182 Chronic kidney disease, stage 2 (mild): Secondary | ICD-10-CM

## 2019-09-12 LAB — GLUCOSE, POCT (MANUAL RESULT ENTRY): POC Glucose: 190 mg/dl — AB (ref 70–99)

## 2019-09-12 MED ORDER — CANAGLIFLOZIN 100 MG PO TABS: ORAL_TABLET | ORAL | 3 refills | Status: DC

## 2019-09-12 NOTE — Progress Notes (Signed)
Patient ID: Mario [name], male   DOB: 1941-01-17, 79 y.o.   MRN: [id number]   Reason for Appointment: follow-up   History of Present Illness   Diagnosis: Type 2 DIABETES MELITUS, date of diagnosis:  1989     Previous history: He was initially diagnosed when hospitalized for pancreatitis and his previous endocrinologist had treated him with multiple medications because of progression of his diabetes. He was also put on Cycloset  which he has tolerated maximum dose He has had adjustments of his medication dosages the last couple of years and Januvia was restarted in 5/14 when blood sugars were higher. Dosages have been adjusted based on renal function Previously an A1c levels have been ranging from 6.6-7.5 His metformin was increased  when renal function was better and glipizide was changed to glipizide ER 10 mg . Did not appear to have better blood sugar control with Invokana He was started on Victoza for improved control when his A1c was 7.9% in 02/2015  Recent history:    Non-insulin hypoglycemic drugs:  Glyset 50 mg bid, glipizide ER 10 mg , Metformin 1 g twice a day,   Victoza 1.8 mg daily       His A1c is somewhat higher at 7.8, previously was the same at 7.5, has been as low as 6.7  Blood sugar patterns and problems:  He did not bring his monitor for download  As before he is checking blood sugars mostly midday  Despite instructions he is still eating cereal in the morning and today also had bread with this  He still takes his glasses after eating even though he was told to take it before starting to eat on each visit  Does not forget his Victoza and is otherwise regular with his medication  He has only very recently started exercising with his gym activities  His weight is down relatively compared to last time and not clear why  Lab glucose was 138 possibly fasting and postprandial 190 now  Side effects from medications: None.  Monitors blood glucose:  Once a day or less .    Glucometer: Freestyle    Blood sugars varied from 140-200 at home by recall  Blood sugar checked only after breakfast with a range of 141-212 with average 169   Meals: 2-3 meals per day, no lunch often (yogurt); breakfast is at 9 am cereal, [place] bacon, sometimes bread and eggs at breakfast; donuts occasionally      Dietician consultation last: 3/16           Wt Readings from Last 3 Encounters:  09/12/19 164 lb 12.8 oz (74.8 kg)  07/18/19 170 lb 12.8 oz (77.5 kg)  07/04/19 163 lb 12.8 oz (74.3 kg)    Lab Results  Component Value Date   HGBA1C 7.8 (H) 09/09/2019   HGBA1C 7.5 (H) 06/10/2019   HGBA1C 7.5 (H) 02/11/2019   Lab Results  Component Value Date   MICROALBUR 9.2 (H) 03/04/2019   LDLCALC 40 06/10/2019   CREATININE 1.31 09/09/2019     LABS:  Office Visit on 09/12/2019  Component Date Value Ref Range Status  . POC Glucose 09/12/2019 190* 70 - 99 mg/dl Final  Lab on 09/09/2019  Component Date Value Ref Range Status  . TSH 09/09/2019 3.12  0.35 - 4.50 uIU/mL Final  . Sodium 09/09/2019 137  135 - 145 mEq/L Final  . Potassium 09/09/2019 4.5  3.5 - 5.1 mEq/L Final  . Chloride 09/09/2019 109  96 -  112 mEq/L Final  . CO2 09/09/2019 21  19 - 32 mEq/L Final  . Glucose, Bld 09/09/2019 138* 70 - 99 mg/dL Final  . BUN 09/09/2019 27* 6 - 23 mg/dL Final  . Creatinine, Ser 09/09/2019 1.31  0.40 - 1.50 mg/dL Final  . GFR 09/09/2019 52.86* >60.00 mL/min Final  . Calcium 09/09/2019 8.7  8.4 - 10.5 mg/dL Final  . Hgb A1c MFr Bld 09/09/2019 7.8* 4.6 - 6.5 % Final   Glycemic Control Guidelines for People with Diabetes:Non Diabetic:  <6%Goal of Therapy: <7%Additional Action Suggested:  >8%     Allergies as of 09/12/2019      Reactions   Penicillins Anaphylaxis   Has patient had a PCN reaction causing immediate rash, facial/tongue/throat swelling, SOB or lightheadedness with hypotension: Yes Has patient had a PCN reaction causing severe rash involving  mucus membranes or skin necrosis: No Has patient had a PCN reaction that required hospitalization: Yes Has patient had a PCN reaction occurring within the last 10 years: Unknown If all of the above answers are "NO", then may proceed with Cephalosporin use.   Plavix [clopidogrel Bisulfate] Hives   Pletal [cilostazol] Itching      Medication List       Accurate as of September 12, 2019 11:09 AM. If you have any questions, ask your nurse or doctor.        allopurinol 100 MG tablet Commonly known as: ZYLOPRIM Take 1 tablet by mouth daily What changed: when to take this   ARTIFICIAL TEAR OP Apply 1 drop to eye daily as needed (dry eyes).   aspirin EC 81 MG tablet Take 81 mg by mouth daily.   atorvastatin 40 MG tablet Commonly known as: LIPITOR take 1 tablet by mouth daily at 6:00pm What changed: See the new instructions.   calcium carbonate 500 MG chewable tablet Commonly known as: TUMS - dosed in mg elemental calcium Chew 1 tablet by mouth daily as needed for indigestion or heartburn.   carvedilol 3.125 MG tablet Commonly known as: COREG TAKE 1 TABLET BY MOUTH TWICE A DAY What changed: when to take this   EQ Vision Formula 50+ Caps Take 1 capsule by mouth daily.   fenofibrate micronized 43 MG capsule Commonly known as: ANTARA Take 43 mg by mouth every evening.   ferrous sulfate 325 (65 FE) MG tablet Take 325 mg by mouth daily.   finasteride 5 MG tablet Commonly known as: PROSCAR Take 5 mg by mouth every evening.   folic acid 277 MCG tablet Commonly known as: FOLVITE Take 800 mcg by mouth daily.   freestyle lancets Use as instructed to check blood sugar 2 times per day dx code E11.65   FreeStyle Lite Devi USE TO TEST BLOOD SUGAR TWICE DAILY   glipiZIDE 10 MG 24 hr tablet Commonly known as: GLUCOTROL XL TAKE 1 TABLET BY MOUTH AT BEDTIME FOR DIABETES. What changed: See the new instructions.   glucose blood test strip Commonly known as: FREESTYLE LITE Use as  instructed to check blood sugar 2 times per day dx code E11.65   lisinopril 5 MG tablet Commonly known as: ZESTRIL Take 1 tablet (5 mg total) by mouth daily.   metFORMIN 1000 MG tablet Commonly known as: GLUCOPHAGE TAKE 1 TABLET BY MOUTH TWICE DAILY FOR BREAKFAST AND DINNER FOR DIABETES. What changed: See the new instructions.   miglitol 50 MG tablet Commonly known as: GLYSET TAKE 1 TABLET BY MOUTH TWICE DAILY AT BREAKFAST AND DINNER FOR DIABETES. What  changed: See the new instructions.   Qunol CoQ10/Ubiquinol/Mega 100 MG Caps Generic drug: Ubiquinol Take 1 capsule by mouth daily.   tamsulosin 0.4 MG Caps capsule Commonly known as: FLOMAX Take 0.4 mg by mouth 2 (two) times daily.   Victoza 18 MG/3ML Sopn Generic drug: liraglutide INJECT 1.8MG  INTO THE SKIN DAILYAT DINNERTIME FOR DIABETES.   vitamin B-12 1000 MCG tablet Commonly known as: CYANOCOBALAMIN Take 1,000 mcg by mouth daily.   Vitamin D 50 MCG (2000 UT) Caps Take 2,000 Units by mouth daily.       Allergies:  Allergies  Allergen Reactions  . Penicillins Anaphylaxis    Has patient had a PCN reaction causing immediate rash, facial/tongue/throat swelling, SOB or lightheadedness with hypotension: Yes Has patient had a PCN reaction causing severe rash involving mucus membranes or skin necrosis: No Has patient had a PCN reaction that required hospitalization: Yes Has patient had a PCN reaction occurring within the last 10 years: Unknown If all of the above answers are "NO", then may proceed with Cephalosporin use.   Marland Kitchen Plavix [Clopidogrel Bisulfate] Hives  . Pletal [Cilostazol] Itching    Past Medical History:  Diagnosis Date  . Anemia   . Arthritis   . CAD (coronary artery disease)    a.  s/p IMI and BMS 1997;  b. Myoview 4/16:  inf scar with peri-infarct ischemia, EF 34%; high risk;  c. LHC 5/16:  3 v CAD >> CABG (free L-LAD, S-OM, S-dRCA)  . Cancer ([place])    skin cancer  . Carotid stenosis    a. carotid  US 6/16:  bilat ICA 1-39%  . Chronic kidney disease, stage II (mild)   . Chronic systolic CHF (congestive heart failure) ([place])   . COPD (chronic obstructive pulmonary disease) ([place])   . Essential hypertension, benign   . Gout   . HLD (hyperlipidemia)   . Inguinal adenopathy 09/19/2018  . Ischemic cardiomyopathy    a. EF by myoview 4/16 34%; b. LHC 5/16: EF 40-45%;  c. intraop TEE 6/16: EF 45-50%  . Orthostasis   . PAD (peripheral artery disease) ([facility])    Dr. Fletcher Anon  . Pneumonia    hx of  . Type II or unspecified type diabetes mellitus without mention of complication, uncontrolled     Past Surgical History:  Procedure Laterality Date  . ABDOMINAL AORTAGRAM N/A 10/09/2013   Procedure: ABDOMINAL Maxcine Ham;  Surgeon: Wellington Hampshire, MD;  Location: [facility];  Service: Cardiovascular;  Laterality: N/A;  . ABDOMINAL AORTOGRAM N/A 12/28/2016   Procedure: ABDOMINAL AORTOGRAM;  Surgeon: Waynetta Sandy, MD;  Location: [facility];  Service: Cardiovascular;  Laterality: N/A;  . ABDOMINAL AORTOGRAM W/LOWER EXTREMITY Bilateral 09/11/2018   Procedure: ABDOMINAL AORTOGRAM W/LOWER EXTREMITY;  Surgeon: Serafina Mitchell, MD;  Location: [facility];  Service: Cardiovascular;  Laterality: Bilateral;  . CARDIAC CATHETERIZATION N/A 10/02/2014   Procedure: Left Heart Cath and Coronary Angiography;  Surgeon: Belva Crome, MD;  Location: [facility];  Service: Cardiovascular;  Laterality: N/A;  . CATARACT EXTRACTION    . CHOLECYSTECTOMY    . COLONOSCOPY    . CORONARY ARTERY BYPASS GRAFT N/A 11/03/2014   Procedure: CORONARY ARTERY BYPASS GRAFTING  times three using left internal mammary and right greater saphenous vein;  Surgeon: Gaye Pollack, MD;  Location: [facility];  Service: Open Heart Surgery;  Laterality: N/A;  . FEMORAL-POPLITEAL BYPASS GRAFT Left 09/18/2018   Procedure: LEFT ILEO FEMORAL ENDARTERECTOMY WITH VEIN  PATCH AND ANGIOPLASTY, POPLITEAL  ENDARTERECTOMY, FEM POP  BYPASS;  Surgeon: Serafina Mitchell, MD;  Location: [facility];  Service: Vascular;  Laterality: Left;  . HERNIA REPAIR    . LOWER EXTREMITY ANGIOGRAPHY Bilateral 12/28/2016   Procedure: Lower Extremity Angiography;  Surgeon: Waynetta Sandy, MD;  Location: [facility];  Service: Cardiovascular;  Laterality: Bilateral;  . LYMPH NODE DISSECTION Right 09/19/2018   Procedure: SUPERFICIAL RIGHT LYMPH NODE DISSECTION;  Surgeon: Fanny Skates, MD;  Location: [facility];  Service: General;  Laterality: Right;  . MELANOMA EXCISION    . PERIPHERAL VASCULAR BALLOON ANGIOPLASTY Right 12/28/2016   Procedure: PERIPHERAL VASCULAR BALLOON ANGIOPLASTY;  Surgeon: Waynetta Sandy, MD;  Location: [facility];  Service: Cardiovascular;  Laterality: Right;  SFA UNSUCCESSFUL PTA  UNABLE TO CROSS LESION  . SKIN LESION EXCISION     multiple  . TEE WITHOUT CARDIOVERSION N/A 11/03/2014   Procedure: TRANSESOPHAGEAL ECHOCARDIOGRAM (TEE);  Surgeon: Gaye Pollack, MD;  Location: [facility];  Service: Open Heart Surgery;  Laterality: N/A;  . VIDEO BRONCHOSCOPY WITH ENDOBRONCHIAL NAVIGATION N/A 11/22/2018   Procedure: VIDEO BRONCHOSCOPY WITH ENDOBRONCHIAL NAVIGATION;  Surgeon: [name], MD;  Location: [facility] OR;  Service: Thoracic;  Laterality: N/A;    Family History  Problem Relation Age of Onset  . Colon cancer Father   . Heart disease Mother   . Non-Hodgkin's lymphoma Daughter     Social History:  reports that he quit smoking about 4 years ago. His smoking use included cigarettes. He has a 25.00 pack-year smoking history. He has never used smokeless tobacco. He reports that he does not drink alcohol or use drugs.  Review of Systems:   Hypertension:  taking lisinopril 10 mg and carvedilol low-dose No lightheadedness but blood pressure appears to be slightly lower than usual Not monitoring at home  BP Readings from Last 3 Encounters:  09/12/19 (!) 142/70  07/18/19 (!) 164/78  07/04/19  126/84      Lab Results  Component Value Date   CREATININE 1.31 09/09/2019   CREATININE 1.45 06/10/2019   CREATININE 1.26 02/11/2019     Lipids: Well controlled with atorvastatin 40 mg, LDL below 70, as before HDL is still low     Lab Results  Component Value Date   CHOL 100 06/10/2019   HDL 36.10 (L) 06/10/2019   LDLCALC 40 06/10/2019   TRIG 118.0 06/10/2019   CHOLHDL 3 06/10/2019     Has had covid vaccine His last Pneumovax was in 2020    Examination:   BP (!) 142/70 (BP Location: Left Arm, Patient Position: Sitting, Cuff Size: Normal)   Pulse 76   Ht 6\' 2"  (1.88 m)   Wt 164 lb 12.8 oz (74.8 kg)   SpO2 90%   BMI 21.16 kg/m   Body mass index is 21.16 kg/m.   No pedal edema  ASSESSMENT/ PLAN:   Diabetes type 2, nonobese:   See history of present illness for detailed discussion of his current management, blood sugar patterns and problems identified  Blood glucose control is not as good again with A1c 7.8 and increasing  He is on multiple drugs including Victoza, glipizide and Metformin  Not clear if melatonin is helping especially if he cannot remember to take it before starting to eat Meals are in balance with some excessive carbohydrate especially at breakfast  Discussed adding protein to breakfast and he can use lean meat. Detailed information on options for breakfast were given Stressed  importance of checking blood sugars more regularly and especially after meals and also some in the morning Considering his cardiovascular risk she is a good candidate for medication like Invokana Discussed action of SGLT 2 drugs on lowering glucose by decreasing kidney absorption of glucose, benefits of weight loss and lower blood pressure, possible side effects including candidiasis and dosage regimen   To start with 100 mg of Invokana every morning before breakfast, increase fluid intake and stop miglitol We will review blood sugars and short-term control in 1  month .  HYPERTENSION: Blood pressure is being treated with only 5 mg lisinopril and since he is starting Invokana will stop lisinopril 5 mg dose for now  Lipids: Well-controlled    There are no Patient Instructions on file for this visit.   [name] 09/12/2019, 11:09 AM

## 2019-09-12 NOTE — Patient Instructions (Addendum)
Top Miglitol and Lisinopril with starting Invokana  Take Invokana before Bfst  Check blood sugars on waking up 3 days a week  Also check blood sugars about 2 hours after meals and do this after different meals by rotation  Recommended blood sugar levels on waking up are 90-130 and about 2 hours after meal is 130-160  Please bring your blood sugar monitor to each visit, thank you

## 2019-09-13 DIAGNOSIS — I2510 Atherosclerotic heart disease of native coronary artery without angina pectoris: Secondary | ICD-10-CM | POA: Diagnosis not present

## 2019-09-13 DIAGNOSIS — I10 Essential (primary) hypertension: Secondary | ICD-10-CM | POA: Diagnosis not present

## 2019-09-13 DIAGNOSIS — C3492 Malignant neoplasm of unspecified part of left bronchus or lung: Secondary | ICD-10-CM | POA: Diagnosis not present

## 2019-09-13 DIAGNOSIS — N401 Enlarged prostate with lower urinary tract symptoms: Secondary | ICD-10-CM | POA: Diagnosis not present

## 2019-09-13 DIAGNOSIS — E782 Mixed hyperlipidemia: Secondary | ICD-10-CM | POA: Diagnosis not present

## 2019-09-13 DIAGNOSIS — E11319 Type 2 diabetes mellitus with unspecified diabetic retinopathy without macular edema: Secondary | ICD-10-CM | POA: Diagnosis not present

## 2019-09-13 DIAGNOSIS — E1122 Type 2 diabetes mellitus with diabetic chronic kidney disease: Secondary | ICD-10-CM | POA: Diagnosis not present

## 2019-09-13 DIAGNOSIS — E7800 Pure hypercholesterolemia, unspecified: Secondary | ICD-10-CM | POA: Diagnosis not present

## 2019-09-13 DIAGNOSIS — C3490 Malignant neoplasm of unspecified part of unspecified bronchus or lung: Secondary | ICD-10-CM | POA: Diagnosis not present

## 2019-09-13 DIAGNOSIS — N183 Chronic kidney disease, stage 3 unspecified: Secondary | ICD-10-CM | POA: Diagnosis not present

## 2019-09-13 DIAGNOSIS — E7849 Other hyperlipidemia: Secondary | ICD-10-CM | POA: Diagnosis not present

## 2019-09-26 ENCOUNTER — Other Ambulatory Visit: Payer: Self-pay

## 2019-09-26 NOTE — Patient Outreach (Signed)
[facility]) Care Management Chronic Special Needs Program  09/26/2019  Name: Mario [name] DOB: [birth year]  MRN: [id number]  [name] is enrolled in a chronic special needs plan for Diabetes. Client reports he continues to have pain in his legs.  He reports pain level to day is a 5 or 6 out of 10.  Client advised to follow up with his vascular doctor regarding ongoing leg symptoms. Discussed client wearing his compression hose. Client reports," they don't seem to help much."    Client reports his blood sugars have been ranging from 140 to 200's. He states his endocrinologist adjusted/ made changes to his medications. He reports compliance with taking his medications. Client states he is playing pickleball at least 2 times per week.     Subjective:  Goals Addressed            This Visit's Progress   . COMPLETED: "I want to exercise more [facility]" (pt-stated)       Client report he is playing pickleball weekly and going to the [facility]    . Client understands the importance of follow-up with providers by attending scheduled visits   On track    Most recent primary care provider visit 05/30/19  Most recent endocrinology visit 09/12/19 Continue to maintain and keep follow up visits with your providers    . Client will report following up with his doctor within 6 month regarding leg pain       RN case manager advised client to report his ongoing leg pain symptoms to his doctor.     . Client will report no worsening of symptoms related to heart disease within the next 6 months   On track    Client denies any heart symptoms. Reports he follows up with his cardiologist 1 time per year.  Cardiology visit 12/25/17 Continue to follow up with your doctor as recommended.  Continue to take your medication as prescribed.     . Client will verbalize knowledge of chronic lung disease as evidenced by no ED visits or Inpatient stays  related to chronic lung disease    On track    Client denies any ED visits due to lung problems.  Continue to follow up with your doctor as recommended.,  Contact your doctor if you have questions. Take your medications as prescribed.     . Client will verbalize knowledge of self management of Hypertension as evidences by BP reading of 140/90 or less; or as defined by provider   On track    Client confirms he checks his blood pressure at the [facility]. He reports blood pressure is "very good."  Continue to take your medications as prescribed.     Marland Kitchen HEMOGLOBIN A1C < 7.0       Advised client to check his blood sugars daily and vary times he checks his blood sugars.  Discussed blood sugar and Hgb A1c targets Reviewed self management actions reviewed:  Glucose monitoring per provider recommendations- encouraged him to check post meal per Mario [name] suggestion  Eat Healthy  Visit provider every 3-6 months as directed  Hbg A1C level every 3-6 months.      . Maintain timely refills of diabetic medication as prescribed within the year .   On track    Review of electronic medical record medication dispense report indicates client maintains timely refills of diabetic medications  Continue to refill your medications timely    .  Obtain annual  Lipid Profile, LDL-C   On track    Lipid profile completed 06/10/19 The goal for LDL is less than 70 mg/ dl as you are at high risk for complications try to avoid saturated fats, trans-fats, and eat more fiber.    . Obtain Annual Eye (retinal)  Exam    On track    Most recent diabetes eye exam completed on 11/20/18 with no evidence of diabetic retinopathy Plan to schedule your eye exam yearly    . Obtain Annual Foot Exam   On track    Assessed frequency of foot checks and importance of yearly diabetic foot exam Diabetes foot care - Check feet daily at home (look for skin color changes, cuts, sores or cracks in the skin, swelling of feet or ankles, ingrown  or fungal toenails, corn or calluses). Report these findings to your doctor - Wash feet with soap and water, dry feet well especially between toes - Moisturize your feet but not between the toes - Always wear shoes that protect your whole feet.      . Obtain annual screen for micro albuminuria (urine) , nephropathy (kidney problems)   On track    Reviewed results (within normal limits) of urine for protein done 03/04/19 Attend yearly physicals and follow up visits with your providers and complete labs as recommended.     Illa Level Hemoglobin A1C at least 2 times per year   On track    Hgb A1c completed 05/31/19 and 09/09/19 Continue to keep your follow up appointments with your provider and have lab work completed as recommended.     . Visit Primary Care Provider or Endocrinologist at least 2 times per year    On track    Most recent primary care provider visit 05/30/19  Most recent endocrinology visit 09/12/19 Continue to maintain and keep follow up visits with your providers      ASSESSMENT;  Reviewed and updated individualized care plan. Client continues to be followed by [facility].  Most recent visit with [facility] 09/12/19. Next follow up visit is scheduled for 10/07/19 RNCM advised client to notify MD of any changes in condition prior to scheduled appointment. Client advised to contact RNCM as needed and contact their HTA concierge for benefit questions.  RNCM provided client 24 hour HTA nurse advise line number [phone]  [facility] verified client aware of 911 services for urgent/ emergent needs.  Plan: RNCM will send client and primary care provider updated individualized care plan  Chronic care management coordinator will outreach in:  6 Months    Mario Plowman RN,BSN,CCM Chronic Care Management Coordinator [place] [phone]    .

## 2019-10-14 DIAGNOSIS — I739 Peripheral vascular disease, unspecified: Secondary | ICD-10-CM | POA: Diagnosis not present

## 2019-10-14 DIAGNOSIS — E7800 Pure hypercholesterolemia, unspecified: Secondary | ICD-10-CM | POA: Diagnosis not present

## 2019-10-14 DIAGNOSIS — Z1389 Encounter for screening for other disorder: Secondary | ICD-10-CM | POA: Diagnosis not present

## 2019-10-14 DIAGNOSIS — E1122 Type 2 diabetes mellitus with diabetic chronic kidney disease: Secondary | ICD-10-CM | POA: Diagnosis not present

## 2019-10-14 DIAGNOSIS — I10 Essential (primary) hypertension: Secondary | ICD-10-CM | POA: Diagnosis not present

## 2019-10-14 DIAGNOSIS — Z0000 Encounter for general adult medical examination without abnormal findings: Secondary | ICD-10-CM | POA: Diagnosis not present

## 2019-10-14 DIAGNOSIS — C3492 Malignant neoplasm of unspecified part of left bronchus or lung: Secondary | ICD-10-CM | POA: Diagnosis not present

## 2019-10-14 DIAGNOSIS — I2510 Atherosclerotic heart disease of native coronary artery without angina pectoris: Secondary | ICD-10-CM | POA: Diagnosis not present

## 2019-10-15 ENCOUNTER — Ambulatory Visit: Payer: HMO

## 2019-10-15 ENCOUNTER — Other Ambulatory Visit: Payer: Self-pay

## 2019-10-15 ENCOUNTER — Other Ambulatory Visit (INDEPENDENT_AMBULATORY_CARE_PROVIDER_SITE_OTHER): Payer: HMO

## 2019-10-15 DIAGNOSIS — E1151 Type 2 diabetes mellitus with diabetic peripheral angiopathy without gangrene: Secondary | ICD-10-CM | POA: Diagnosis not present

## 2019-10-15 LAB — BASIC METABOLIC PANEL
BUN: 37 mg/dL — ABNORMAL HIGH (ref 6–23)
CO2: 24 mEq/L (ref 19–32)
Calcium: 9.4 mg/dL (ref 8.4–10.5)
Chloride: 108 mEq/L (ref 96–112)
Creatinine, Ser: 1.54 mg/dL — ABNORMAL HIGH (ref 0.40–1.50)
GFR: 43.85 mL/min — ABNORMAL LOW (ref >60.00)
Glucose, Bld: 124 mg/dL — ABNORMAL HIGH (ref 70–99)
Potassium: 4.7 mEq/L (ref 3.5–5.1)
Sodium: 137 mEq/L (ref 135–145)

## 2019-10-16 LAB — FRUCTOSAMINE: Fructosamine: 295 umol/L — ABNORMAL HIGH (ref 0–285)

## 2019-10-17 ENCOUNTER — Other Ambulatory Visit: Payer: Self-pay

## 2019-10-17 ENCOUNTER — Ambulatory Visit: Payer: HMO | Admitting: Endocrinology

## 2019-10-17 ENCOUNTER — Encounter: Payer: Self-pay | Admitting: Endocrinology

## 2019-10-17 VITALS — BP 140/64 | HR 76 | Ht 74.0 in | Wt 161.0 lb

## 2019-10-17 DIAGNOSIS — N289 Disorder of kidney and ureter, unspecified: Secondary | ICD-10-CM | POA: Diagnosis not present

## 2019-10-17 DIAGNOSIS — E1165 Type 2 diabetes mellitus with hyperglycemia: Secondary | ICD-10-CM | POA: Diagnosis not present

## 2019-10-17 MED ORDER — FREESTYLE LITE DEVI: 1 refills | Status: AC

## 2019-10-17 NOTE — Progress Notes (Signed)
Patient ID: Mario [name], male   DOB: [birth year], 79 y.o.   MRN: [id number]   Reason for Appointment: follow-up   History of Present Illness   Diagnosis: Type 2 DIABETES MELITUS, date of diagnosis:  1989     Previous history: He was initially diagnosed when hospitalized for pancreatitis and his previous endocrinologist had treated him with multiple medications because of progression of his diabetes. He was also put on Cycloset  which he has tolerated maximum dose He has had adjustments of his medication dosages the last couple of years and Januvia was restarted in 5/14 when blood sugars were higher. Dosages have been adjusted based on renal function Previously an A1c levels have been ranging from 6.6-7.5 His metformin was increased  when renal function was better and glipizide was changed to glipizide ER 10 mg . Did not appear to have better blood sugar control with Invokana He was started on Victoza for improved control when his A1c was 7.9% in 02/2015  Recent history:    Non-insulin hypoglycemic drugs:   glipizide ER 10 mg , Metformin 1 g twice a day,   Victoza 1.8 mg daily, Invokana 100 mg daily      His A1c in April was somewhat higher at 7.8,has been as low as 6.7 Fructosamine is 295  Blood sugar patterns and problems:  Since he had a rising A1c and blood sugars as high as 190 he was started on Invokana instead of Glyset  Not clear if his blood sugars are improved as he has checked very infrequently and time on the monitor his meter  Has had blood sugars as high as 249 he is not clear why  He is mostly eating grits without any protein in the morning  Lab glucose was 124 but not clear if this was fasting  He says that he is getting error messages on his FreeStyle meter and it is relatively old also  No side effects with Invokana but his renal function is slightly worse  No hypoglycemia with glipizide  However has lost 3 pounds and is concerned about  it  He has been trying to be more active with playing pickle ball  Side effects from medications: None.  Monitors blood glucose: Once a day or less .    Glucometer: Freestyle    Midday and afternoon blood sugar range 140-240 Fasting 136 today AVERAGE 166   Meals: 2-3 meals per day, no lunch often (yogurt); breakfast is at 9 am grits, [place] bacon, sometimes bread and eggs at breakfast; donuts occasionally      Dietician consultation last: 3/16           Wt Readings from Last 3 Encounters:  10/17/19 161 lb (73 kg)  09/12/19 164 lb 12.8 oz (74.8 kg)  07/18/19 170 lb 12.8 oz (77.5 kg)    Lab Results  Component Value Date   HGBA1C 7.8 (H) 09/09/2019   HGBA1C 7.5 (H) 06/10/2019   HGBA1C 7.5 (H) 02/11/2019   Lab Results  Component Value Date   MICROALBUR 9.2 (H) 03/04/2019   LDLCALC 40 06/10/2019   CREATININE 1.54 (H) 10/15/2019     LABS:  Lab on 10/15/2019  Component Date Value Ref Range Status  . Fructosamine 10/15/2019 295* 0 - 285 umol/L Final   Comment: Published reference interval for apparently healthy subjects between age 51 and 72 is 32 - 285 umol/L and in a poorly controlled diabetic population is 228 - 563 umol/L with a mean of  396 umol/L.   Marland Kitchen Sodium 10/15/2019 137  135 - 145 mEq/L Final  . Potassium 10/15/2019 4.7  3.5 - 5.1 mEq/L Final  . Chloride 10/15/2019 108  96 - 112 mEq/L Final  . CO2 10/15/2019 24  19 - 32 mEq/L Final  . Glucose, Bld 10/15/2019 124* 70 - 99 mg/dL Final  . BUN 10/15/2019 37* 6 - 23 mg/dL Final  . Creatinine, Ser 10/15/2019 1.54* 0.40 - 1.50 mg/dL Final  . GFR 10/15/2019 43.85* >60.00 mL/min Final  . Calcium 10/15/2019 9.4  8.4 - 10.5 mg/dL Final    Allergies as of 10/17/2019      Reactions   Penicillins Anaphylaxis   Has patient had a PCN reaction causing immediate rash, facial/tongue/throat swelling, SOB or lightheadedness with hypotension: Yes Has patient had a PCN reaction causing severe rash involving mucus membranes or  skin necrosis: No Has patient had a PCN reaction that required hospitalization: Yes Has patient had a PCN reaction occurring within the last 10 years: Unknown If all of the above answers are "NO", then may proceed with Cephalosporin use.   Plavix [clopidogrel Bisulfate] Hives   Pletal [cilostazol] Itching      Medication List       Accurate as of Oct 17, 2019 11:59 PM. If you have any questions, ask your nurse or doctor.        STOP taking these medications   lisinopril 5 MG tablet Commonly known as: ZESTRIL Stopped by: [name], MD   miglitol 50 MG tablet Commonly known as: GLYSET Stopped by: [name], MD     TAKE these medications   allopurinol 100 MG tablet Commonly known as: ZYLOPRIM Take 1 tablet by mouth daily What changed: when to take this   ARTIFICIAL TEAR OP Apply 1 drop to eye daily as needed (dry eyes).   aspirin EC 81 MG tablet Take 81 mg by mouth daily.   atorvastatin 40 MG tablet Commonly known as: LIPITOR take 1 tablet by mouth daily at 6:00pm What changed: See the new instructions.   calcium carbonate 500 MG chewable tablet Commonly known as: TUMS - dosed in mg elemental calcium Chew 1 tablet by mouth daily as needed for indigestion or heartburn.   canagliflozin 100 MG Tabs tablet Commonly known as: Invokana 1 tablet before breakfast   carvedilol 3.125 MG tablet Commonly known as: COREG TAKE 1 TABLET BY MOUTH TWICE A DAY What changed: when to take this   EQ Vision Formula 50+ Caps Take 1 capsule by mouth daily.   fenofibrate micronized 43 MG capsule Commonly known as: ANTARA Take 43 mg by mouth every evening.   ferrous sulfate 325 (65 FE) MG tablet Take 325 mg by mouth daily.   finasteride 5 MG tablet Commonly known as: PROSCAR Take 5 mg by mouth every evening.   folic acid 580 MCG tablet Commonly known as: FOLVITE Take 800 mcg by mouth daily.   freestyle lancets Use as instructed to check blood sugar 2 times per day dx  code E11.65   FreeStyle Lite Devi USE TO TEST BLOOD SUGAR TWICE DAILY. DX:E11.65 What changed:   additional instructions  Another medication with the same name was removed. Continue taking this medication, and follow the directions you see here. Changed by: Jayme Cloud, LPN   glipiZIDE 10 MG 24 hr tablet Commonly known as: GLUCOTROL XL TAKE 1 TABLET BY MOUTH AT BEDTIME FOR DIABETES. What changed: See the new instructions.   glucose blood test strip Commonly  known as: FREESTYLE LITE Use as instructed to check blood sugar 2 times per day dx code E11.65   metFORMIN 1000 MG tablet Commonly known as: GLUCOPHAGE TAKE 1 TABLET BY MOUTH TWICE DAILY FOR BREAKFAST AND DINNER FOR DIABETES. What changed: See the new instructions.   Qunol CoQ10/Ubiquinol/Mega 100 MG Caps Generic drug: Ubiquinol Take 1 capsule by mouth daily.   tamsulosin 0.4 MG Caps capsule Commonly known as: FLOMAX Take 0.4 mg by mouth 2 (two) times daily.   Victoza 18 MG/3ML Sopn Generic drug: liraglutide INJECT 1.8MG  INTO THE SKIN DAILYAT DINNERTIME FOR DIABETES.   vitamin B-12 1000 MCG tablet Commonly known as: CYANOCOBALAMIN Take 1,000 mcg by mouth daily.   Vitamin D 50 MCG (2000 UT) Caps Take 2,000 Units by mouth daily.       Allergies:  Allergies  Allergen Reactions  . Penicillins Anaphylaxis    Has patient had a PCN reaction causing immediate rash, facial/tongue/throat swelling, SOB or lightheadedness with hypotension: Yes Has patient had a PCN reaction causing severe rash involving mucus membranes or skin necrosis: No Has patient had a PCN reaction that required hospitalization: Yes Has patient had a PCN reaction occurring within the last 10 years: Unknown If all of the above answers are "NO", then may proceed with Cephalosporin use.   Marland Kitchen Plavix [Clopidogrel Bisulfate] Hives  . Pletal [Cilostazol] Itching    Past Medical History:  Diagnosis Date  . Anemia   . Arthritis   . CAD (coronary  artery disease)    a.  s/p IMI and BMS 1997;  b. Myoview 4/16:  inf scar with peri-infarct ischemia, EF 34%; high risk;  c. LHC 5/16:  3 v CAD >> CABG (free L-LAD, S-OM, S-dRCA)  . Cancer ([place])    skin cancer  . Carotid stenosis    a. carotid US 6/16:  bilat ICA 1-39%  . Chronic kidney disease, stage II (mild)   . Chronic systolic CHF (congestive heart failure) ([place])   . COPD (chronic obstructive pulmonary disease) ([place])   . Essential hypertension, benign   . Gout   . HLD (hyperlipidemia)   . Inguinal adenopathy 09/19/2018  . Ischemic cardiomyopathy    a. EF by myoview 4/16 34%; b. LHC 5/16: EF 40-45%;  c. intraop TEE 6/16: EF 45-50%  . Orthostasis   . PAD (peripheral artery disease) ([facility])    Dr. Fletcher Anon  . Pneumonia    hx of  . Type II or unspecified type diabetes mellitus without mention of complication, uncontrolled     Past Surgical History:  Procedure Laterality Date  . ABDOMINAL AORTAGRAM N/A 10/09/2013   Procedure: ABDOMINAL Maxcine Ham;  Surgeon: Wellington Hampshire, MD;  Location: [facility] CATH [facility];  Service: Cardiovascular;  Laterality: N/A;  . ABDOMINAL AORTOGRAM N/A 12/28/2016   Procedure: ABDOMINAL AORTOGRAM;  Surgeon: Waynetta Sandy, MD;  Location: [facility] CV LAB;  Service: Cardiovascular;  Laterality: N/A;  . ABDOMINAL AORTOGRAM W/LOWER EXTREMITY Bilateral 09/11/2018   Procedure: ABDOMINAL AORTOGRAM W/LOWER EXTREMITY;  Surgeon: Serafina Mitchell, MD;  Location: [place] CV LAB;  Service: Cardiovascular;  Laterality: Bilateral;  . CARDIAC CATHETERIZATION N/A 10/02/2014   Procedure: Left Heart Cath and Coronary Angiography;  Surgeon: Belva Crome, MD;  Location: [facility] CV LAB;  Service: Cardiovascular;  Laterality: N/A;  . CATARACT EXTRACTION    . CHOLECYSTECTOMY    . COLONOSCOPY    . CORONARY ARTERY BYPASS GRAFT N/A 11/03/2014   Procedure: CORONARY ARTERY BYPASS GRAFTING  times three using  left internal mammary and right greater saphenous vein;  Surgeon: Gaye Pollack, MD;  Location: [facility] OR;  Service: Open Heart Surgery;  Laterality: N/A;  . FEMORAL-POPLITEAL BYPASS GRAFT Left 09/18/2018   Procedure: LEFT ILEO FEMORAL ENDARTERECTOMY WITH VEIN PATCH AND ANGIOPLASTY, POPLITEAL  ENDARTERECTOMY, FEM POP BYPASS;  Surgeon: Serafina Mitchell, MD;  Location: [facility];  Service: Vascular;  Laterality: Left;  . HERNIA REPAIR    . LOWER EXTREMITY ANGIOGRAPHY Bilateral 12/28/2016   Procedure: Lower Extremity Angiography;  Surgeon: Waynetta Sandy, MD;  Location: [facility];  Service: Cardiovascular;  Laterality: Bilateral;  . LYMPH NODE DISSECTION Right 09/19/2018   Procedure: SUPERFICIAL RIGHT LYMPH NODE DISSECTION;  Surgeon: Fanny Skates, MD;  Location: [facility];  Service: General;  Laterality: Right;  . MELANOMA EXCISION    . PERIPHERAL VASCULAR BALLOON ANGIOPLASTY Right 12/28/2016   Procedure: PERIPHERAL VASCULAR BALLOON ANGIOPLASTY;  Surgeon: Waynetta Sandy, MD;  Location: [facility] CV LAB;  Service: Cardiovascular;  Laterality: Right;  SFA UNSUCCESSFUL PTA  UNABLE TO CROSS LESION  . SKIN LESION EXCISION     multiple  . TEE WITHOUT CARDIOVERSION N/A 11/03/2014   Procedure: TRANSESOPHAGEAL ECHOCARDIOGRAM (TEE);  Surgeon: Gaye Pollack, MD;  Location: [facility];  Service: Open Heart Surgery;  Laterality: N/A;  . VIDEO BRONCHOSCOPY WITH ENDOBRONCHIAL NAVIGATION N/A 11/22/2018   Procedure: VIDEO BRONCHOSCOPY WITH ENDOBRONCHIAL NAVIGATION;  Surgeon: [name], MD;  Location[facility] OR;  Service: Thoracic;  Laterality: N/A;    Family History  Problem Relation Age of Onset  . Colon cancer Father   . Heart disease Mother   . Non-Hodgkin's lymphoma Daughter     Social History:  reports that he quit smoking about 4 years ago. His smoking use included cigarettes. He has a 25.00 pack-year smoking history. He has never used smokeless tobacco. He reports that he does not drink alcohol or use drugs.  Review of Systems:   Hypertension:  Was  taking lisinopril 5 mg and carvedilol low-dose With starting Invokana his lisinopril has been stopped  BP Readings from Last 3 Encounters:  10/17/19 140/64  09/12/19 (!) 142/70  07/18/19 (!) 164/78   Renal function is slightly worse, starting Invokana recently  Lab Results  Component Value Date   CREATININE 1.54 (H) 10/15/2019   CREATININE 1.31 09/09/2019   CREATININE 1.45 06/10/2019     Lipids: Well controlled with atorvastatin 40 mg, LDL below 70, as before HDL is still low     Lab Results  Component Value Date   CHOL 100 06/10/2019   HDL 36.10 (L) 06/10/2019   LDLCALC 40 06/10/2019   TRIG 118.0 06/10/2019   CHOLHDL 3 06/10/2019     Has had the covid vaccine His last Pneumovax was in 2020    Examination:   BP 140/64 (BP Location: Left Arm, Patient Position: Sitting, Cuff Size: Normal)   Pulse 76   Ht 6\' 2"  (1.88 m)   Wt 161 lb (73 kg)   SpO2 98%   BMI 20.67 kg/m   Body mass index is 20.67 kg/m.    ASSESSMENT/ PLAN:   Diabetes type 2, nonobese:   See history of present illness for detailed discussion of his current management, blood sugar patterns and problems identified  His A1c was 7.8  Fructosamine of 295 indicates relatively better control  He is on multiple drugs and now Invokana in addition to Victoza, glipizide and Metformin  Not clear if his meter is accurate as he  has had readings as high as 240 but blood sugars appear to be better on not as high in the lab Currently checking blood sugars mostly randomly in the afternoon and not clear how often he is doing it after meals and his meter is possibly reading falsely high at times Time of the meter is not accurate His blood sugar may be higher after breakfast with reading only carbohydrate and he does not get his protein  Although his renal function is slightly worse this may be the initial phase and he is not on any blood pressure medications or nonsteroidal drugs to affect renal function Likely  needs to increase fluid intake  We will continue with medications unchanged except at [facility] at breakfast at least when he is eating carbohydrate Reminded him to take this before starting to eat and not after Check A1c on the next visit New meter has been prescribed  HYPERTENSION: Lisinopril has been stopped and blood pressure is still adequately controlled with being on Invokana  We will follow-up in another 2 months, he will call if he is has any consistently high or low readings Also he will need to follow-up with renal function on the next visit    Patient Instructions  Take miglitol in am at start of meal  Check blood sugars on waking up 3 days a week  Also check blood sugars about 2 hours after meals and do this after different meals by rotation  Recommended blood sugar levels on waking up are 90-130 and about 2 hours after meal is 130-160  Please bring your blood sugar monitor to each visit, thank you  More fluids      [name] 10/18/2019, 12:16 PM

## 2019-10-17 NOTE — Patient Instructions (Addendum)
Take miglitol in am at start of meal  Check blood sugars on waking up 3 days a week  Also check blood sugars about 2 hours after meals and do this after different meals by rotation  Recommended blood sugar levels on waking up are 90-130 and about 2 hours after meal is 130-160  Please bring your blood sugar monitor to each visit, thank you  More fluids

## 2019-11-19 DIAGNOSIS — E7800 Pure hypercholesterolemia, unspecified: Secondary | ICD-10-CM | POA: Diagnosis not present

## 2019-11-19 DIAGNOSIS — E1151 Type 2 diabetes mellitus with diabetic peripheral angiopathy without gangrene: Secondary | ICD-10-CM | POA: Diagnosis not present

## 2019-11-19 DIAGNOSIS — I2510 Atherosclerotic heart disease of native coronary artery without angina pectoris: Secondary | ICD-10-CM | POA: Diagnosis not present

## 2019-11-19 DIAGNOSIS — C3490 Malignant neoplasm of unspecified part of unspecified bronchus or lung: Secondary | ICD-10-CM | POA: Diagnosis not present

## 2019-11-19 DIAGNOSIS — N183 Chronic kidney disease, stage 3 unspecified: Secondary | ICD-10-CM | POA: Diagnosis not present

## 2019-11-19 DIAGNOSIS — E1122 Type 2 diabetes mellitus with diabetic chronic kidney disease: Secondary | ICD-10-CM | POA: Diagnosis not present

## 2019-11-19 DIAGNOSIS — E782 Mixed hyperlipidemia: Secondary | ICD-10-CM | POA: Diagnosis not present

## 2019-11-19 DIAGNOSIS — C3492 Malignant neoplasm of unspecified part of left bronchus or lung: Secondary | ICD-10-CM | POA: Diagnosis not present

## 2019-11-19 DIAGNOSIS — I10 Essential (primary) hypertension: Secondary | ICD-10-CM | POA: Diagnosis not present

## 2019-11-19 DIAGNOSIS — N401 Enlarged prostate with lower urinary tract symptoms: Secondary | ICD-10-CM | POA: Diagnosis not present

## 2019-11-20 ENCOUNTER — Encounter (INDEPENDENT_AMBULATORY_CARE_PROVIDER_SITE_OTHER): Payer: HMO | Admitting: Ophthalmology

## 2019-11-20 ENCOUNTER — Other Ambulatory Visit: Payer: Self-pay

## 2019-11-20 DIAGNOSIS — H35033 Hypertensive retinopathy, bilateral: Secondary | ICD-10-CM | POA: Diagnosis not present

## 2019-11-20 DIAGNOSIS — H353122 Nonexudative age-related macular degeneration, left eye, intermediate dry stage: Secondary | ICD-10-CM | POA: Diagnosis not present

## 2019-11-20 DIAGNOSIS — H43813 Vitreous degeneration, bilateral: Secondary | ICD-10-CM

## 2019-11-20 DIAGNOSIS — I10 Essential (primary) hypertension: Secondary | ICD-10-CM | POA: Diagnosis not present

## 2019-12-10 ENCOUNTER — Telehealth: Payer: Self-pay | Admitting: Endocrinology

## 2019-12-10 NOTE — Telephone Encounter (Signed)
Noted  

## 2019-12-10 NOTE — Telephone Encounter (Signed)
Patient came by to drop off financial assistance paperwork for Dr Dwyane Dee to fill out - I informed patient the standard time for this is 7-10 business days and filled out the Fee Form and attached to envelope and placed in Dr Ronnie Derby box.  Patient wanted to let Dr Dwyane Dee know that if this is approved for assistance, he would like to go back to taking the victoza 1.8 instead of the 1.2 since he had the dose changed due to cost.

## 2019-12-10 NOTE — Telephone Encounter (Signed)
Noted. Forwarded to MD to make him aware.

## 2019-12-17 ENCOUNTER — Other Ambulatory Visit: Payer: Self-pay

## 2019-12-17 ENCOUNTER — Other Ambulatory Visit (INDEPENDENT_AMBULATORY_CARE_PROVIDER_SITE_OTHER): Payer: HMO

## 2019-12-17 DIAGNOSIS — E1165 Type 2 diabetes mellitus with hyperglycemia: Secondary | ICD-10-CM

## 2019-12-17 LAB — BASIC METABOLIC PANEL
BUN: 31 mg/dL — ABNORMAL HIGH (ref 6–23)
CO2: 20 mEq/L (ref 19–32)
Calcium: 9.3 mg/dL (ref 8.4–10.5)
Chloride: 111 mEq/L (ref 96–112)
Creatinine, Ser: 1.48 mg/dL (ref 0.40–1.50)
GFR: 45.89 mL/min — ABNORMAL LOW (ref >60.00)
Glucose, Bld: 185 mg/dL — ABNORMAL HIGH (ref 70–99)
Potassium: 4.4 mEq/L (ref 3.5–5.1)
Sodium: 137 mEq/L (ref 135–145)

## 2019-12-17 LAB — HEMOGLOBIN A1C: Hgb A1c MFr Bld: 7.5 % — ABNORMAL HIGH (ref 4.6–6.5)

## 2019-12-19 ENCOUNTER — Ambulatory Visit (INDEPENDENT_AMBULATORY_CARE_PROVIDER_SITE_OTHER): Payer: HMO | Admitting: Endocrinology

## 2019-12-19 ENCOUNTER — Encounter: Payer: Self-pay | Admitting: Endocrinology

## 2019-12-19 ENCOUNTER — Other Ambulatory Visit: Payer: Self-pay

## 2019-12-19 VITALS — BP 134/62 | HR 75 | Ht 74.0 in | Wt 159.0 lb

## 2019-12-19 DIAGNOSIS — N1831 Chronic kidney disease, stage 3a: Secondary | ICD-10-CM

## 2019-12-19 DIAGNOSIS — E1165 Type 2 diabetes mellitus with hyperglycemia: Secondary | ICD-10-CM

## 2019-12-19 MED ORDER — INSULIN PEN NEEDLE 31G X 5 MM MISC: 1 refills | Status: AC

## 2019-12-19 MED ORDER — LANTUS SOLOSTAR 100 UNIT/ML ~~LOC~~ SOPN: 10.0000 [IU] | PEN_INJECTOR | Freq: Every day | SUBCUTANEOUS | 1 refills | Status: AC

## 2019-12-19 MED ORDER — LANTUS SOLOSTAR 100 UNIT/ML ~~LOC~~ SOPN
10.0000 [IU] | PEN_INJECTOR | Freq: Every day | SUBCUTANEOUS | 1 refills | Status: DC
Start: 2019-12-19 — End: 2019-12-19

## 2019-12-19 NOTE — Progress Notes (Addendum)
Patient ID: MILLION MAHARAJ, male   DOB: [birth year], 79 y.o.   MRN: [id number]   Reason for Appointment: follow-up   History of Present Illness   Diagnosis: Type 2 DIABETES MELITUS, date of diagnosis:  1989     Previous history: He was initially diagnosed when hospitalized for pancreatitis and his previous endocrinologist had treated him with multiple medications because of progression of his diabetes. He was also put on Cycloset  which he has tolerated maximum dose He has had adjustments of his medication dosages the last couple of years and Januvia was restarted in 5/14 when blood sugars were higher. Dosages have been adjusted based on renal function Previously an A1c levels have been ranging from 6.6-7.5 His metformin was increased  when renal function was better and glipizide was changed to glipizide ER 10 mg . Did not appear to have better blood sugar control with Invokana He was started on Victoza for improved control when his A1c was 7.9% in 02/2015  Recent history:    Non-insulin hypoglycemic drugs:   glipizide ER 10 mg , Metformin 1 g twice a day,   Victoza 1.8 mg daily, Invokana 100 mg daily , Glyset 25 mg at breakfast     His A1c is 7.5 compared to 7.8  Previously has been as low as 6.7 Fructosamine was 295  Blood sugar patterns and problems:  He was given a new glucose meter on the last visit  However he did not bring this  He has checked only a couple of readings FASTING and he remembers a reading of about 180; also lab glucose fasting was 185  He thinks highest blood sugar at home is 220 after a meal  He has cut back on carbohydrates in the morning and not eating cereal or grits but more protein like eggs and [place] bacon  Although he is going to the gym and doing exercises with a trainer he is not playing pickle ball which he thinks was more intensive  Weight is slowly decreasing this year  He is regular with all his medications and he thinks he is  taking his Glyset when starting to eat breakfast  Takes Victoza before dinner usually   Side effects from medications: None.  Monitors blood glucose: Once a day or less .    Glucometer: Freestyle    Blood sugars as above Previous average 166  Meals: 2-3 meals per day, no lunch often (yogurt); breakfast is at 9 am grits, [place] bacon, sometimes bread and eggs at breakfast; donuts occasionally      Dietician consultation last: 3/16          Wt Readings from Last 3 Encounters:  12/19/19 159 lb (72.1 kg)  10/17/19 161 lb (73 kg)  09/12/19 164 lb 12.8 oz (74.8 kg)    Lab Results  Component Value Date   HGBA1C 7.5 (H) 12/17/2019   HGBA1C 7.8 (H) 09/09/2019   HGBA1C 7.5 (H) 06/10/2019   Lab Results  Component Value Date   MICROALBUR 9.2 (H) 03/04/2019   LDLCALC 40 06/10/2019   CREATININE 1.48 12/17/2019     LABS:  Lab on 12/17/2019  Component Date Value Ref Range Status  . Sodium 12/17/2019 137  135 - 145 mEq/L Final  . Potassium 12/17/2019 4.4  3.5 - 5.1 mEq/L Final  . Chloride 12/17/2019 111  96 - 112 mEq/L Final  . CO2 12/17/2019 20  19 - 32 mEq/L Final  . Glucose, Bld 12/17/2019 185* 70 -  99 mg/dL Final  . BUN 12/17/2019 31* 6 - 23 mg/dL Final  . Creatinine, Ser 12/17/2019 1.48  0.40 - 1.50 mg/dL Final  . GFR 12/17/2019 45.89* >60.00 mL/min Final  . Calcium 12/17/2019 9.3  8.4 - 10.5 mg/dL Final  . Hgb A1c MFr Bld 12/17/2019 7.5* 4.6 - 6.5 % Final   Glycemic Control Guidelines for People with Diabetes:Non Diabetic:  <6%Goal of Therapy: <7%Additional Action Suggested:  >8%     Allergies as of 12/19/2019      Reactions   Penicillins Anaphylaxis   Has patient had a PCN reaction causing immediate rash, facial/tongue/throat swelling, SOB or lightheadedness with hypotension: Yes Has patient had a PCN reaction causing severe rash involving mucus membranes or skin necrosis: No Has patient had a PCN reaction that required hospitalization: Yes Has patient had a PCN  reaction occurring within the last 10 years: Unknown If all of the above answers are "NO", then may proceed with Cephalosporin use.   Plavix [clopidogrel Bisulfate] Hives   Pletal [cilostazol] Itching      Medication List       Accurate as of December 19, 2019 11:06 AM. If you have any questions, ask your nurse or doctor.        allopurinol 100 MG tablet Commonly known as: ZYLOPRIM Take 1 tablet by mouth daily What changed: when to take this   ARTIFICIAL TEAR OP Apply 1 drop to eye daily as needed (dry eyes).   aspirin EC 81 MG tablet Take 81 mg by mouth daily.   atorvastatin 40 MG tablet Commonly known as: LIPITOR take 1 tablet by mouth daily at 6:00pm What changed: See the new instructions.   calcium carbonate 500 MG chewable tablet Commonly known as: TUMS - dosed in mg elemental calcium Chew 1 tablet by mouth daily as needed for indigestion or heartburn.   canagliflozin 100 MG Tabs tablet Commonly known as: Invokana 1 tablet before breakfast   carvedilol 3.125 MG tablet Commonly known as: COREG TAKE 1 TABLET BY MOUTH TWICE A DAY What changed: when to take this   EQ Vision Formula 50+ Caps Take 1 capsule by mouth daily.   fenofibrate micronized 43 MG capsule Commonly known as: ANTARA Take 43 mg by mouth every evening.   ferrous sulfate 325 (65 FE) MG tablet Take 325 mg by mouth daily.   finasteride 5 MG tablet Commonly known as: PROSCAR Take 5 mg by mouth every evening.   folic acid 703 MCG tablet Commonly known as: FOLVITE Take 800 mcg by mouth daily.   freestyle lancets Use as instructed to check blood sugar 2 times per day dx code E11.65   FreeStyle Lite Devi USE TO TEST BLOOD SUGAR TWICE DAILY. DX:E11.65   glipiZIDE 10 MG 24 hr tablet Commonly known as: GLUCOTROL XL TAKE 1 TABLET BY MOUTH AT BEDTIME FOR DIABETES. What changed: See the new instructions.   glucose blood test strip Commonly known as: FREESTYLE LITE Use as instructed to check  blood sugar 2 times per day dx code E11.65   metFORMIN 1000 MG tablet Commonly known as: GLUCOPHAGE TAKE 1 TABLET BY MOUTH TWICE DAILY FOR BREAKFAST AND DINNER FOR DIABETES. What changed: See the new instructions.   Qunol CoQ10/Ubiquinol/Mega 100 MG Caps Generic drug: Ubiquinol Take 1 capsule by mouth daily.   tamsulosin 0.4 MG Caps capsule Commonly known as: FLOMAX Take 0.4 mg by mouth 2 (two) times daily.   Victoza 18 MG/3ML Sopn Generic drug: liraglutide INJECT 1.8MG  INTO THE  SKIN DAILYAT DINNERTIME FOR DIABETES.   vitamin B-12 1000 MCG tablet Commonly known as: CYANOCOBALAMIN Take 1,000 mcg by mouth daily.   Vitamin D 50 MCG (2000 UT) Caps Take 2,000 Units by mouth daily.       Allergies:  Allergies  Allergen Reactions  . Penicillins Anaphylaxis    Has patient had a PCN reaction causing immediate rash, facial/tongue/throat swelling, SOB or lightheadedness with hypotension: Yes Has patient had a PCN reaction causing severe rash involving mucus membranes or skin necrosis: No Has patient had a PCN reaction that required hospitalization: Yes Has patient had a PCN reaction occurring within the last 10 years: Unknown If all of the above answers are "NO", then may proceed with Cephalosporin use.   Marland Kitchen Plavix [Clopidogrel Bisulfate] Hives  . Pletal [Cilostazol] Itching    Past Medical History:  Diagnosis Date  . Anemia   . Arthritis   . CAD (coronary artery disease)    a.  s/p IMI and BMS 1997;  b. Myoview 4/16:  inf scar with peri-infarct ischemia, EF 34%; high risk;  c. LHC 5/16:  3 v CAD >> CABG (free L-LAD, S-OM, S-dRCA)  . Cancer ([place])    skin cancer  . Carotid stenosis    a. carotid US 6/16:  bilat ICA 1-39%  . Chronic kidney disease, stage II (mild)   . Chronic systolic CHF (congestive heart failure) ([place])   . COPD (chronic obstructive pulmonary disease) ([place])   . Essential hypertension, benign   . Gout   . HLD (hyperlipidemia)   . Inguinal adenopathy  09/19/2018  . Ischemic cardiomyopathy    a. EF by myoview 4/16 34%; b. LHC 5/16: EF 40-45%;  c. intraop TEE 6/16: EF 45-50%  . Orthostasis   . PAD (peripheral artery disease) ([facility])    Dr. Fletcher Anon  . Pneumonia    hx of  . Type II or unspecified type diabetes mellitus without mention of complication, uncontrolled     Past Surgical History:  Procedure Laterality Date  . ABDOMINAL AORTAGRAM N/A 10/09/2013   Procedure: ABDOMINAL Maxcine Ham;  Surgeon: Wellington Hampshire, MD;  Location: [facility]ATH [facility];  Service: Cardiovascular;  Laterality: N/A;  . ABDOMINAL AORTOGRAM N/A 12/28/2016   Procedure: ABDOMINAL AORTOGRAM;  Surgeon: Waynetta Sandy, MD;  Location: [facility];  Service: Cardiovascular;  Laterality: N/A;  . ABDOMINAL AORTOGRAM W/LOWER EXTREMITY Bilateral 09/11/2018   Procedure: ABDOMINAL AORTOGRAM W/LOWER EXTREMITY;  Surgeon: Serafina Mitchell, MD;  Location: [facility];  Service: Cardiovascular;  Laterality: Bilateral;  . CARDIAC CATHETERIZATION N/A 10/02/2014   Procedure: Left Heart Cath and Coronary Angiography;  Surgeon: Belva Crome, MD;  Location: [facility];  Service: Cardiovascular;  Laterality: N/A;  . CATARACT EXTRACTION    . CHOLECYSTECTOMY    . COLONOSCOPY    . CORONARY ARTERY BYPASS GRAFT N/A 11/03/2014   Procedure: CORONARY ARTERY BYPASS GRAFTING  times three using left internal mammary and right greater saphenous vein;  Surgeon: Gaye Pollack, MD;  Location: [facility] OR;  Service: Open Heart Surgery;  Laterality: N/A;  . FEMORAL-POPLITEAL BYPASS GRAFT Left 09/18/2018   Procedure: LEFT ILEO FEMORAL ENDARTERECTOMY WITH VEIN PATCH AND ANGIOPLASTY, POPLITEAL  ENDARTERECTOMY, FEM POP BYPASS;  Surgeon: Serafina Mitchell, MD;  Location: [facility];  Service: Vascular;  Laterality: Left;  . HERNIA REPAIR    . LOWER EXTREMITY ANGIOGRAPHY Bilateral 12/28/2016   Procedure: Lower Extremity Angiography;  Surgeon: Waynetta Sandy, MD;  Location: [facility]  CV LAB;   Service: Cardiovascular;  Laterality: Bilateral;  . LYMPH NODE DISSECTION Right 09/19/2018   Procedure: SUPERFICIAL RIGHT LYMPH NODE DISSECTION;  Surgeon: Fanny Skates, MD;  Location: [facility];  Service: General;  Laterality: Right;  . MELANOMA EXCISION    . PERIPHERAL VASCULAR BALLOON ANGIOPLASTY Right 12/28/2016   Procedure: PERIPHERAL VASCULAR BALLOON ANGIOPLASTY;  Surgeon: Waynetta Sandy, MD;  Location: [facility];  Service: Cardiovascular;  Laterality: Right;  SFA UNSUCCESSFUL PTA  UNABLE TO CROSS LESION  . SKIN LESION EXCISION     multiple  . TEE WITHOUT CARDIOVERSION N/A 11/03/2014   Procedure: TRANSESOPHAGEAL ECHOCARDIOGRAM (TEE);  Surgeon: Gaye Pollack, MD;  Location: [facility];  Service: Open Heart Surgery;  Laterality: N/A;  . VIDEO BRONCHOSCOPY WITH ENDOBRONCHIAL NAVIGATION N/A 11/22/2018   Procedure: VIDEO BRONCHOSCOPY WITH ENDOBRONCHIAL NAVIGATION;  Surgeon: [name], MD;  Location: [facility] OR;  Service: Thoracic;  Laterality: N/A;    Family History  Problem Relation Age of Onset  . Colon cancer Father   . Heart disease Mother   . Non-Hodgkin's lymphoma Daughter     Social History:  reports that he quit smoking about 5 years ago. His smoking use included cigarettes. He has a 25.00 pack-year smoking history. He has never used smokeless tobacco. He reports that he does not drink alcohol and does not use drugs.  Review of Systems:   Hypertension:  Was taking lisinopril 5 mg and carvedilol low-dose With starting Invokana his lisinopril has been stopped  BP Readings from Last 3 Encounters:  12/19/19 (!) 134/62  10/17/19 140/64  09/12/19 (!) 142/70   Renal function is slightly improved, slightly higher initially with starting Invokana  Lab Results  Component Value Date   CREATININE 1.48 12/17/2019   CREATININE 1.54 (H) 10/15/2019   CREATININE 1.31 09/09/2019     Lipids: Well controlled with atorvastatin 40 mg, LDL below 70, as before HDL is still  low     Lab Results  Component Value Date   CHOL 100 06/10/2019   HDL 36.10 (L) 06/10/2019   LDLCALC 40 06/10/2019   TRIG 118.0 06/10/2019   CHOLHDL 3 06/10/2019    His last Pneumovax was in 2020    Examination:   BP (!) 134/62 (BP Location: Left Arm, Patient Position: Sitting, Cuff Size: Normal)   Pulse 75   Ht 6\' 2"  (1.88 m)   Wt 159 lb (72.1 kg)   SpO2 99%   BMI 20.41 kg/m   Body mass index is 20.41 kg/m.    ASSESSMENT/ PLAN:   Diabetes type 2, nonobese:   See history of present illness for detailed discussion of his current management, blood sugar patterns and problems identified  His A1c is 7.5, previously was 7.8  Blood sugars are not well controlled and appears to have high fasting readings This is despite initially having some improvement with Invokana He did not bring his monitor for download but has had readings as high as 220 Also he has a tendency to lose weight with continuing Victoza and Invokana Since his fasting readings appear to be about 180 he likely is getting insulin deficient and not benefiting from long-term use of glipizide  Discussed in detail the need for starting basal insulin   Explained how basal insulin works, timing of injection, dosage, injection sites.  Also discussed titration based on fasting blood sugar every 3 days by 2 units and at target of 90-130 for fasting reading.   He will start with  6 units of Lantus  Given a flowsheet with instructions on how to keep a record and adjust the doses Patient information on basal insulin, general information on diabetes insulin as well as hypoglycemia given  Discussed potential for hypoglycemia but unlikely since he will start with a small dose and also stop GLIPIZIDE at the same time  Since he is somewhat concerned about starting insulin for the first time we will also schedule him for diabetes education for short-term follow-up  HYPERTENSION: Blood pressure normal with only  Invokana  CKD 3A: Stable   There are no Patient Instructions on file for this visit.   [name] 12/19/2019, 11:06 AM

## 2019-12-19 NOTE — Patient Instructions (Addendum)
Lantus insulin: This insulin provides blood sugar control for up to 24 hours.   Start with 6 units at bedtime daily and increase by 2 units every 3 days until the waking up sugars are under 130. Then continue the same dose.   If blood sugar is under 90 for 2 days in a row, reduce the dose by 2 units.  Note that this insulin does not control the rise of blood sugar with meals    Stop Glipizide  Check blood sugars on waking up 7 days a week  Also check blood sugars about 2 hours after meals and do this after different meals by rotation  Recommended blood sugar levels on waking up are 90-130 and about 2 hours after meal is 130-160  Please bring your blood sugar monitor to each visit, thank you

## 2019-12-20 DIAGNOSIS — E11319 Type 2 diabetes mellitus with unspecified diabetic retinopathy without macular edema: Secondary | ICD-10-CM | POA: Diagnosis not present

## 2019-12-20 DIAGNOSIS — C3490 Malignant neoplasm of unspecified part of unspecified bronchus or lung: Secondary | ICD-10-CM | POA: Diagnosis not present

## 2019-12-20 DIAGNOSIS — E782 Mixed hyperlipidemia: Secondary | ICD-10-CM | POA: Diagnosis not present

## 2019-12-20 DIAGNOSIS — C3492 Malignant neoplasm of unspecified part of left bronchus or lung: Secondary | ICD-10-CM | POA: Diagnosis not present

## 2019-12-20 DIAGNOSIS — I10 Essential (primary) hypertension: Secondary | ICD-10-CM | POA: Diagnosis not present

## 2019-12-20 DIAGNOSIS — E1122 Type 2 diabetes mellitus with diabetic chronic kidney disease: Secondary | ICD-10-CM | POA: Diagnosis not present

## 2019-12-20 DIAGNOSIS — N183 Chronic kidney disease, stage 3 unspecified: Secondary | ICD-10-CM | POA: Diagnosis not present

## 2019-12-20 DIAGNOSIS — E1151 Type 2 diabetes mellitus with diabetic peripheral angiopathy without gangrene: Secondary | ICD-10-CM | POA: Diagnosis not present

## 2019-12-20 DIAGNOSIS — I2510 Atherosclerotic heart disease of native coronary artery without angina pectoris: Secondary | ICD-10-CM | POA: Diagnosis not present

## 2019-12-20 DIAGNOSIS — N401 Enlarged prostate with lower urinary tract symptoms: Secondary | ICD-10-CM | POA: Diagnosis not present

## 2019-12-20 DIAGNOSIS — E7800 Pure hypercholesterolemia, unspecified: Secondary | ICD-10-CM | POA: Diagnosis not present

## 2019-12-24 ENCOUNTER — Other Ambulatory Visit: Payer: Self-pay

## 2019-12-24 DIAGNOSIS — I779 Disorder of arteries and arterioles, unspecified: Secondary | ICD-10-CM

## 2019-12-26 ENCOUNTER — Telehealth: Payer: Self-pay

## 2019-12-26 NOTE — Telephone Encounter (Signed)
PATIENT ASSISTANCE PROGRAM  PROVIDER SECTION OF APPLICATION Company: [employer]  Medication(s) ordered: Victoza 1.8mg  Document: Rx  Rx has been faxed successfully to [employer] listed above. Documents and fax confirmation have been placed in the faxed file for future reference.  PATIENT SECTION OF APPLICATION PATIENT PORTION of the application COMPLETED FULLY? Yes Provided financials? Yes, 2020 W2, Tax Return and SS Benefit Statement  Above information was included with Rx and faxed successfully to [employer]. Confirmation received. Documents and fax confirmation have been placed in the faxed file for future reference.

## 2019-12-27 NOTE — Telephone Encounter (Signed)
PATIENT ASSISTANCE PROGRAM - APPROVAL  Received notification from [employer] indicating application for Victoza has been approved. Document has been labeled and placed in scans file for HIM scanning process and for our future reference.

## 2020-01-03 ENCOUNTER — Other Ambulatory Visit: Payer: Self-pay

## 2020-01-03 DIAGNOSIS — I779 Disorder of arteries and arterioles, unspecified: Secondary | ICD-10-CM

## 2020-01-03 DIAGNOSIS — I6523 Occlusion and stenosis of bilateral carotid arteries: Secondary | ICD-10-CM

## 2020-01-05 ENCOUNTER — Other Ambulatory Visit: Payer: Self-pay | Admitting: Endocrinology

## 2020-01-06 ENCOUNTER — Encounter: Payer: Self-pay | Admitting: Surgery

## 2020-01-06 ENCOUNTER — Ambulatory Visit (INDEPENDENT_AMBULATORY_CARE_PROVIDER_SITE_OTHER)
Admission: RE | Admit: 2020-01-06 | Discharge: 2020-01-06 | Disposition: A | Discharge status: Home / Self Care | Payer: HMO | Source: Ambulatory Visit | Attending: Surgery | Admitting: Surgery

## 2020-01-06 ENCOUNTER — Inpatient Hospital Stay (HOSPITAL_COMMUNITY): Admission: RE | Admit: 2020-01-06 | Payer: HMO | Source: Ambulatory Visit

## 2020-01-06 ENCOUNTER — Other Ambulatory Visit: Payer: Self-pay

## 2020-01-06 ENCOUNTER — Ambulatory Visit (INDEPENDENT_AMBULATORY_CARE_PROVIDER_SITE_OTHER): Payer: HMO | Admitting: Surgery

## 2020-01-06 ENCOUNTER — Ambulatory Visit (HOSPITAL_COMMUNITY)
Admission: RE | Admit: 2020-01-06 | Discharge: 2020-01-06 | Disposition: A | Discharge status: Home / Self Care | Payer: HMO | Source: Ambulatory Visit | Attending: Surgery | Admitting: Surgery

## 2020-01-06 VITALS — BP 129/72 | HR 67 | Temp 97.5°F | Resp 20 | Ht 74.0 in | Wt 158.0 lb

## 2020-01-06 DIAGNOSIS — I779 Disorder of arteries and arterioles, unspecified: Secondary | ICD-10-CM | POA: Insufficient documentation

## 2020-01-06 DIAGNOSIS — I7025 Atherosclerosis of native arteries of other extremities with ulceration: Secondary | ICD-10-CM | POA: Diagnosis not present

## 2020-01-06 DIAGNOSIS — I6523 Occlusion and stenosis of bilateral carotid arteries: Secondary | ICD-10-CM | POA: Diagnosis not present

## 2020-01-06 NOTE — Progress Notes (Signed)
Vascular and Vein Specialist of [facility]  Patient name: Mario [name] MRN: [id number] DOB: [birth year] Sex: male     REASON FOR Visit:    Follow up  HISTORY OF PRESENT ILLNESS:    Mario Minahan Clareyis a 79 y.o.malewho returns today for follow up.. On 09/18/2018, he underwent left iliofemoral endarterectomy with vein patch angioplasty and left common femoral to below-knee popliteal artery bypass graft with a 6 mm external ring PTFE graft. I also performed a left popliteal endarterectomy. He had extensive exophytic plaque from the distal external iliac artery into the common femoral and profundofemoral artery. The endarterectomy in the below-knee popliteal artery went down to the level of the anterior tibial and tibioperoneal trunk. His saphenous vein was not adequate for bypass. This was all done for nonhealing wound on his left toe.  He has been diagnosed with and treated for lung cancer.  He suffers from coronary artery disease status post CABG and PCI.  He takes a statin for hypercholesterolemia.  He is a former smoker.  He suffers from COPD.  He is trying to be very active.  He goes to the gym to work out.  He has no complaints at this time.  PAST MEDICAL HISTORY    Past Medical History:  Diagnosis Date  . Anemia   . Arthritis   . CAD (coronary artery disease)    a.  s/p IMI and BMS 1997;  b. Myoview 4/16:  inf scar with peri-infarct ischemia, EF 34%; high risk;  c. LHC 5/16:  3 v CAD >> CABG (free L-LAD, S-OM, S-dRCA)  . Cancer ([place])    skin cancer  . Carotid stenosis    a. carotid US 6/16:  bilat ICA 1-39%  . Chronic kidney disease, stage II (mild)   . Chronic systolic CHF (congestive heart failure) ([place])   . COPD (chronic obstructive pulmonary disease) ([place])   . Essential hypertension, benign   . Gout   . HLD (hyperlipidemia)   . Inguinal adenopathy 09/19/2018  . Ischemic cardiomyopathy    a. EF by myoview 4/16 34%; b. LHC  5/16: EF 40-45%;  c. intraop TEE 6/16: EF 45-50%  . Orthostasis   . PAD (peripheral artery disease) ([facility])    Dr. Fletcher Anon  . Pneumonia    hx of  . Type II or unspecified type diabetes mellitus without mention of complication, uncontrolled      FAMILY HISTORY   Family History  Problem Relation Age of Onset  . Colon cancer Father   . Heart disease Mother   . Non-Hodgkin's lymphoma Daughter     SOCIAL HISTORY:   Social History   Socioeconomic History  . Marital status: Married    Spouse name: Pamala Hurry  . Number of children: 3  . Years of education: Not on file  . Highest education level: Professional school degree (e.g., MD, DDS, DVM, JD)  Occupational History  . 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 Smoking status: Former Smoker    Packs/day: 0.50    Years: 50.00    Pack years: 25.00    Types: Cigarettes    Quit date: 10/22/2014    Years since quitting: 5.2  . Smokeless tobacco: Never Used  Vaping Use  . Vaping Use: Never used  Substance and Sexual Activity  . Alcohol use: No    Alcohol/week: 0.0 standard drinks  . Drug use: No  . Sexual activity:  Not on file  Other Topics Concern  . Not on file  Social History Narrative  . Not on file   Social Determinants of Health   Financial Resource Strain: Low Risk   . Difficulty of Paying Living Expenses: Not hard at all  Food Insecurity: No Food Insecurity  . Worried About Charity fundraiser in the Last Year: Never true  . Ran Out of Food in the Last Year: Never true  Transportation Needs: No Transportation Needs  . Lack of Transportation (Medical): No  . Lack of Transportation (Non-Medical): No  Physical Activity: Insufficiently Active  . Days of Exercise per Week: 2 days  . Minutes of Exercise per Session: 30 min  Stress: No Stress Concern Present  . Feeling of Stress : Only a little  Social Connections: Unknown  . Frequency of  Communication with Friends and Family: More than three times a week  . Frequency of Social Gatherings with Friends and Family: Not on file  . Attends Religious Services: Not on file  . Active Member of Clubs or Organizations: Not on file  . Attends Archivist Meetings: Not on file  . Marital Status: Married  Human resources officer Violence:   . Fear of Current or Ex-Partner:   . Emotionally Abused:   Marland Kitchen Physically Abused:   . Sexually Abused:     ALLERGIES:    Allergies  Allergen Reactions  . Penicillins Anaphylaxis    Has patient had a PCN reaction causing immediate rash, facial/tongue/throat swelling, SOB or lightheadedness with hypotension: Yes Has patient had a PCN reaction causing severe rash involving mucus membranes or skin necrosis: No Has patient had a PCN reaction that required hospitalization: Yes Has patient had a PCN reaction occurring within the last 10 years: Unknown If all of the above answers are "NO", then may proceed with Cephalosporin use.   Marland Kitchen Plavix [Clopidogrel Bisulfate] Hives  . Pletal [Cilostazol] Itching    CURRENT MEDICATIONS:    Current Outpatient Medications  Medication Sig Dispense Refill  . allopurinol (ZYLOPRIM) 100 MG tablet Take 1 tablet by mouth daily (Patient taking differently: Take 100 mg by mouth at bedtime. ) 90 tablet 0  . ARTIFICIAL TEAR OP Apply 1 drop to eye daily as needed (dry eyes).    Marland Kitchen aspirin EC 81 MG tablet Take 81 mg by mouth daily.    Marland Kitchen atorvastatin (LIPITOR) 40 MG tablet take 1 tablet by mouth daily at 6:00pm  (Patient taking differently: Take 40 mg by mouth at bedtime. ) 90 tablet 0  . Blood Glucose Monitoring Suppl (FREESTYLE LITE) DEVI USE TO TEST BLOOD SUGAR TWICE DAILY. DX:E11.65 1 each 1  . calcium carbonate (TUMS - DOSED IN MG ELEMENTAL CALCIUM) 500 MG chewable tablet Chew 1 tablet by mouth daily as needed for indigestion or heartburn.    . carvedilol (COREG) 3.125 MG tablet TAKE 1 TABLET BY MOUTH TWICE A DAY  (Patient taking differently: Take 3.125 mg by mouth 2 (two) times daily with a meal. ) 180 tablet 3  . Cholecalciferol (VITAMIN D) 2000 units CAPS Take 2,000 Units by mouth daily.    . fenofibrate micronized (ANTARA) 43 MG capsule Take 43 mg by mouth every evening.    . ferrous sulfate 325 (65 FE) MG tablet Take 325 mg by mouth daily.    . finasteride (PROSCAR) 5 MG tablet Take 5 mg by mouth every evening.   3  . folic acid (FOLVITE) 242 MCG tablet Take 800 mcg by mouth  daily.     . glucose blood (FREESTYLE LITE) test strip Use as instructed to check blood sugar 2 times per day dx code E11.65 100 each 3  . insulin glargine (LANTUS SOLOSTAR) 100 UNIT/ML Solostar Pen Inject 10 Units into the skin daily. 5 pen 1  . Insulin Pen Needle 31G X 5 MM MISC Use once a day to inject Lantus 50 each 1  . INVOKANA 100 MG TABS tablet TAKE ONE TABLET BY MOUTH ONCE DAILY BEFORE BREAKFAST 30 tablet 3  . Lancets (FREESTYLE) lancets Use as instructed to check blood sugar 2 times per day dx code E11.65 100 each 3  . metFORMIN (GLUCOPHAGE) 1000 MG tablet TAKE 1 TABLET BY MOUTH TWICE DAILY FOR BREAKFAST AND DINNER FOR DIABETES. (Patient taking differently: Take 1,000 mg by mouth 2 (two) times daily with a meal. ) 60 tablet 2  . Multiple Vitamins-Minerals (EQ VISION FORMULA 50+) CAPS Take 1 capsule by mouth daily.    . tamsulosin (FLOMAX) 0.4 MG CAPS capsule Take 0.4 mg by mouth 2 (two) times daily.    Marland Kitchen Ubiquinol (QUNOL COQ10/UBIQUINOL/MEGA) 100 MG CAPS Take 1 capsule by mouth daily.    Marland Kitchen VICTOZA 18 MG/3ML SOPN INJECT 1.8MG  INTO THE SKIN DAILYAT DINNERTIME FOR DIABETES. 9 mL 3  . vitamin B-12 (CYANOCOBALAMIN) 1000 MCG tablet Take 1,000 mcg by mouth daily.     No current facility-administered medications for this visit.    REVIEW OF SYSTEMS:   [X]  denotes positive finding, [ ]  denotes negative finding Cardiac  Comments:  Chest pain or chest pressure:    Shortness of breath upon exertion:    Short of breath when  lying flat:    Irregular heart rhythm:        Vascular    Pain in calf, thigh, or hip brought on by ambulation:    Pain in feet at night that wakes you up from your sleep:     Blood clot in your veins:    Leg swelling:         Pulmonary    Oxygen at home:    Productive cough:     Wheezing:         Neurologic    Sudden weakness in arms or legs:     Sudden numbness in arms or legs:     Sudden onset of difficulty speaking or slurred speech:    Temporary loss of vision in one eye:     Problems with dizziness:         Gastrointestinal    Blood in stool:      Vomited blood:         Genitourinary    Burning when urinating:     Blood in urine:        Psychiatric    Major depression:         Hematologic    Bleeding problems:    Problems with blood clotting too easily:        Skin    Rashes or ulcers:        Constitutional    Fever or chills:     PHYSICAL EXAM:   Vitals:   01/06/20 1349  BP: 129/72  Pulse: 67  Resp: 20  Temp: (!) 97.5 F (36.4 C)  SpO2: 98%  Weight: 158 lb (71.7 kg)  Height: 6\' 2"  (1.88 m)    GENERAL: The patient is a well-nourished male, in no acute distress. The vital signs are documented above. CARDIAC: There  is a regular rate and rhythm.  VASCULAR: Palpable dorsalis pedis pulses bilaterally PULMONARY: Nonlabored respirations ABDOMEN: Soft and non-tender with normal pitched bowel sounds.  MUSCULOSKELETAL: There are no major deformities or cyanosis. NEUROLOGIC: No focal weakness or paresthesias are detected. SKIN: There are no ulcers or rashes noted. PSYCHIATRIC: The patient has a normal affect.  STUDIES:   I have reviewed the following:  Left: Patent left fem-pop bypass graft with no evidence of restenosis.   Carotid: Right Carotid: Velocities in the right ICA are consistent with a 1-39%  stenosis.   Left Carotid: Velocities in the left ICA are consistent with a 1-39%  stenosis.        Hemodynamically significant plaque  >50% visualized in the  CCA.   Vertebrals: Bilateral vertebral arteries demonstrate antegrade flow.  Subclavians: Normal flow hemodynamics were seen in bilateral subclavian        arteries.   ASSESSMENT and PLAN   PAD: He was able to heal his ulcers with bypass graft.  His bypass graft is widely patent today.  I will repeat his imaging in 6 months.  Carotid: Bilateral internal carotid arteries are widely patent.  No need for further surveillance at this time.   Leia Alf, MD, FACS [facility] [phone] Pager [phone]

## 2020-01-08 ENCOUNTER — Other Ambulatory Visit: Payer: Self-pay | Admitting: *Deleted

## 2020-01-08 DIAGNOSIS — I7025 Atherosclerosis of native arteries of other extremities with ulceration: Secondary | ICD-10-CM

## 2020-01-08 DIAGNOSIS — I779 Disorder of arteries and arterioles, unspecified: Secondary | ICD-10-CM

## 2020-01-10 ENCOUNTER — Telehealth: Payer: Self-pay

## 2020-01-10 NOTE — Telephone Encounter (Signed)
PAP shipment was received today for pt, and inside shipment was 4 boxes of Victoza, each box containing 3 pens.  Patient was notified that this was available for pick up.  Medications have been labeled,packaged, and placed in sample fridge for patient to pick up when available.

## 2020-01-20 ENCOUNTER — Other Ambulatory Visit: Payer: Self-pay

## 2020-01-20 ENCOUNTER — Ambulatory Visit: Payer: Self-pay | Admitting: Radiation Oncology

## 2020-01-20 ENCOUNTER — Ambulatory Visit (HOSPITAL_COMMUNITY)
Admission: RE | Admit: 2020-01-20 | Discharge: 2020-01-20 | Disposition: A | Discharge status: Home / Self Care | Payer: HMO | Source: Ambulatory Visit | Attending: Radiation Oncology | Admitting: Radiation Oncology

## 2020-01-20 ENCOUNTER — Encounter (HOSPITAL_COMMUNITY): Payer: Self-pay

## 2020-01-20 DIAGNOSIS — I700 Atherosclerosis of aorta: Secondary | ICD-10-CM | POA: Diagnosis not present

## 2020-01-20 DIAGNOSIS — J181 Lobar pneumonia, unspecified organism: Secondary | ICD-10-CM | POA: Diagnosis not present

## 2020-01-20 DIAGNOSIS — J8410 Pulmonary fibrosis, unspecified: Secondary | ICD-10-CM | POA: Diagnosis not present

## 2020-01-20 DIAGNOSIS — C3412 Malignant neoplasm of upper lobe, left bronchus or lung: Secondary | ICD-10-CM | POA: Diagnosis not present

## 2020-01-20 DIAGNOSIS — I2510 Atherosclerotic heart disease of native coronary artery without angina pectoris: Secondary | ICD-10-CM | POA: Diagnosis not present

## 2020-01-21 ENCOUNTER — Other Ambulatory Visit (INDEPENDENT_AMBULATORY_CARE_PROVIDER_SITE_OTHER): Payer: HMO

## 2020-01-21 DIAGNOSIS — E1165 Type 2 diabetes mellitus with hyperglycemia: Secondary | ICD-10-CM | POA: Diagnosis not present

## 2020-01-21 LAB — BASIC METABOLIC PANEL
BUN: 28 mg/dL — ABNORMAL HIGH (ref 6–23)
CO2: 20 mEq/L (ref 19–32)
Calcium: 9.1 mg/dL (ref 8.4–10.5)
Chloride: 108 mEq/L (ref 96–112)
Creatinine, Ser: 1.38 mg/dL (ref 0.40–1.50)
GFR: 49.73 mL/min — ABNORMAL LOW (ref >60.00)
Glucose, Bld: 126 mg/dL — ABNORMAL HIGH (ref 70–99)
Potassium: 3.9 mEq/L (ref 3.5–5.1)
Sodium: 138 mEq/L (ref 135–145)

## 2020-01-22 LAB — FRUCTOSAMINE: Fructosamine: 306 umol/L — ABNORMAL HIGH (ref 0–285)

## 2020-01-22 NOTE — Progress Notes (Signed)
Radiation Oncology         [phone] ________________________________  Name: Mario Mario [name] MRN: [id number]  Date: 01/23/2020  DOB: 07/11/1940  Follow-Up Visit Note  CC: Mario Carol, MD  Mario Carol, MD    ICD-10-CM   1. 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  Interval Since Last Radiation: One year, one month, one week, and two days.  12/05/2018 - 12/14/2018: LUL / 60 Gy in 5 fractions (SBRT)  Narrative:  Mario Mario [name] returns today for routine follow-up. Most recent chest CT scan on 01/20/2020 showed a re-demonstrated dense paramedian consolidation and fibrosis of Mario anterior left upper lobe. There was a significant interval increase in Mario mass-like consolidation and thickening anteriorly, which was generally greater than expected for radiation fibrosis. Mario most discrete component measured approximately 4.4 x 3.6 cm. Findings were concerning for local recurrence of malignancy. There was also interval enlargement of left hilar lymph nodes that were concerning for nodal metastatic disease.  On review of systems, he reports shortness of breath and occasionally productive cough. He denies poor appetite.  He continues to be active going to Mario gym 3 days a week and playing pickle ball.  Denies any pain within Mario left upper chest or hemoptysis  ALLERGIES:  is allergic to penicillins, plavix [clopidogrel bisulfate], and pletal [cilostazol].  Meds: Current Outpatient Medications  Medication Sig Dispense Refill  . allopurinol (ZYLOPRIM) 100 MG tablet Take 1 tablet by mouth daily (Mario [name] taking differently: Take 100 mg by mouth at bedtime. ) 90 tablet 0  . ARTIFICIAL TEAR OP Apply  1 drop to eye daily as needed (dry eyes).    Marland Kitchen aspirin EC 81 MG tablet Take 81 mg by mouth daily.    Marland Kitchen atorvastatin (LIPITOR) 40 MG tablet take 1 tablet by mouth daily at 6:00pm  (Mario [name] taking differently: Take 40 mg by mouth at bedtime. ) 90 tablet 0  . Blood Glucose Monitoring Suppl (FREESTYLE FREEDOM LITE) w/Device KIT USE TO check blood sugar TWICE DAILY    . Blood Glucose Monitoring Suppl (FREESTYLE LITE) DEVI USE TO TEST BLOOD SUGAR TWICE DAILY. DX:E11.65 1 each 1  . calcium carbonate (TUMS - DOSED IN MG ELEMENTAL CALCIUM) 500 MG chewable tablet Chew 1 tablet by mouth daily as needed for indigestion or heartburn.    . carvedilol (COREG) 3.125 MG tablet TAKE 1 TABLET BY MOUTH TWICE A DAY (Mario [name] taking differently: Take 3.125 mg by mouth 2 (two) times daily with a meal. ) 180 tablet 3  . Cholecalciferol (VITAMIN D) 2000 units CAPS Take 2,000 Units by mouth daily.    . fenofibrate micronized (ANTARA) 43 MG capsule Take 43 mg by mouth every evening.    . ferrous sulfate 325 (65 FE) MG tablet Take 325 mg by mouth daily.    . finasteride (PROSCAR) 5 MG tablet Take 5 mg by mouth every evening.   3  . folic acid (FOLVITE) 209 MCG tablet Take 800 mcg by mouth daily.     Marland Kitchen glucose blood (FREESTYLE LITE) test strip Use as instructed to check blood sugar 2 times per day dx code E11.65 100 each 3  . insulin glargine (LANTUS SOLOSTAR) 100 UNIT/ML Solostar Pen Inject 10 Units into  Mario skin daily. 5 pen 1  . Insulin Pen Needle 31G X 5 MM MISC Use once a day to inject Lantus 50 each 1  . INVOKANA 100 MG TABS tablet TAKE ONE TABLET BY MOUTH ONCE DAILY BEFORE BREAKFAST 30 tablet 3  . Lancets (FREESTYLE) lancets Use as instructed to check blood sugar 2 times per day dx code E11.65 100 each 3  . metFORMIN (GLUCOPHAGE) 1000 MG tablet TAKE 1 TABLET BY MOUTH TWICE DAILY FOR BREAKFAST AND DINNER FOR DIABETES. (Mario [name] taking differently: Take 1,000 mg by mouth 2 (two) times daily with a meal. ) 60 tablet 2  .  miglitol (GLYSET) 50 MG tablet Take 50 mg by mouth 2 (two) times daily.    . Multiple Vitamins-Minerals (EQ VISION FORMULA 50+) CAPS Take 1 capsule by mouth daily.    . tamsulosin (FLOMAX) 0.4 MG CAPS capsule Take 0.4 mg by mouth 2 (two) times daily.    Marland Kitchen Ubiquinol (QUNOL COQ10/UBIQUINOL/MEGA) 100 MG CAPS Take 1 capsule by mouth daily.    Marland Kitchen VICTOZA 18 MG/3ML SOPN INJECT 1.8MG INTO Mario SKIN DAILYAT DINNERTIME FOR DIABETES. 9 mL 3  . vitamin B-12 (CYANOCOBALAMIN) 1000 MCG tablet Take 1,000 mcg by mouth daily.     No current facility-administered medications for this encounter.    Physical Findings: Mario Mario [name] is in no acute distress. Mario [name] is alert and oriented.  height is 6' 2"  (1.88 m) and weight is 155 lb 4 oz (70.4 kg). His temporal temperature is 97.3 F (36.3 C) (abnormal). His blood pressure is 147/63 (abnormal) and his pulse is 72. His respiration is 20 and oxygen saturation is 100%.  Lungs are clear to auscultation bilaterally. Heart has regular rate and rhythm. No palpable cervical, supraclavicular, or axillary adenopathy. Abdomen soft, non-tender, normal bowel sounds.    Lab Findings: Lab Results  Component Value Date   WBC 7.1 11/19/2018   HGB 9.8 (L) 11/19/2018   HCT 32.7 (L) 11/19/2018   MCV 90.6 11/19/2018   PLT 200 11/19/2018    Radiographic Findings: CT Chest Wo Contrast  Result Date: 01/20/2020 CLINICAL DATA:  Non-small cell lung cancer, assess treatment response EXAM: CT CHEST WITHOUT CONTRAST TECHNIQUE: Multidetector CT imaging of Mario chest was performed following Mario standard protocol without IV contrast. COMPARISON:  PET-CT, 07/15/2019, CT chest, 04/08/2019 FINDINGS: Cardiovascular: Aortic atherosclerosis. Normal heart size. Extensive 3 vessel coronary artery calcification in or stents. Dense mitral annulus calcifications. No pericardial effusion. Mediastinum/Nodes: Interval enlargement of left hilar lymph nodes, measuring up to 2.0 x 1.1 cm, previously 1.5 x 0.8 cm  (series 2, image 58). Thyroid gland, trachea, and esophagus demonstrate no significant findings. Lungs: Moderate centrilobular emphysema. Redemonstrated dense paramedian consolidation and fibrosis of Mario anterior left upper lobe. There is a significant interval increase in masslike consolidation and thickening anteriorly, which is generally greater than expected for radiation fibrosis, Mario most discrete component measuring approximately 4.4 x 3.6 cm (series 7, image 50). No pleural effusion or pneumothorax. Upper Abdomen: No acute abnormality. Musculoskeletal: No chest wall mass or suspicious bone lesions identified. IMPRESSION: 1. Redemonstrated dense paramedian consolidation and fibrosis of Mario anterior left upper lobe. There is a significant interval increase in masslike consolidation and thickening anteriorly, which is generally greater than expected for radiation fibrosis, Mario most discrete component measuring approximately 4.4 x 3.6 cm. Findings are concerning for local recurrence of malignancy. PET-CT may be helpful to assess for metabolic activity. 2. Interval enlargement of left hilar lymph nodes, concerning for nodal metastatic disease.  3. Emphysema (ICD10-J43.9). 4. Coronary artery disease. Aortic Atherosclerosis (ICD10-I70.0). Electronically Signed   By: Eddie Candle M.D.   On: 01/20/2020 16:07   VAS US CAROTID  Result Date: 01/06/2020 Carotid Arterial Duplex Study Indications:       Carotid artery disease. Risk Factors:      Hypertension, hyperlipidemia, Diabetes, past history of                    smoking, coronary artery disease. Other Factors:     Left fem-pop bypass graft. Comparison Study:  Prior carotid duplex 11/05/2018 showed 1-39% ICA stenosis                    bilaterally. Performing Technologist: Delorise Shiner RVT  Examination Guidelines: A complete evaluation includes B-mode imaging, spectral Doppler, color Doppler, and power Doppler as needed of all accessible portions of each vessel.  Bilateral testing is considered an integral part of a complete examination. Limited examinations for reoccurring indications may be performed as noted.  Right Carotid Findings: +----------+--------+--------+--------+--------------------------+--------+           PSV cm/sEDV cm/sStenosisPlaque Description        Comments +----------+--------+--------+--------+--------------------------+--------+ CCA Prox  85      0                                                  +----------+--------+--------+--------+--------------------------+--------+ CCA Mid   75      9               irregular                          +----------+--------+--------+--------+--------------------------+--------+ CCA Distal64      0               calcific                           +----------+--------+--------+--------+--------------------------+--------+ ICA Prox  98      15      1-39%   irregular and heterogenous         +----------+--------+--------+--------+--------------------------+--------+ ICA Mid   87      18      1-39%   heterogenous                       +----------+--------+--------+--------+--------------------------+--------+ ICA Distal82      17                                                 +----------+--------+--------+--------+--------------------------+--------+ ECA       82      0               calcific                           +----------+--------+--------+--------+--------------------------+--------+ +----------+--------+-------+----------------+-------------------+           PSV cm/sEDV cmsDescribe        Arm Pressure (mmHG) +----------+--------+-------+----------------+-------------------+ Subclavian103     0      Multiphasic, WNL                    +----------+--------+-------+----------------+-------------------+ +---------+--------+--+--------+--+---------+  VertebralPSV cm/s65EDV cm/s17Antegrade +---------+--------+--+--------+--+---------+  Left  Carotid Findings: +----------+--------+--------+--------+----------------------+--------+           PSV cm/sEDV cm/sStenosisPlaque Description    Comments +----------+--------+--------+--------+----------------------+--------+ CCA Prox  78      14                                             +----------+--------+--------+--------+----------------------+--------+ CCA Mid   165     26      >50%    calcific and irregular         +----------+--------+--------+--------+----------------------+--------+ CCA Distal259     31              heterogenous                   +----------+--------+--------+--------+----------------------+--------+ ICA Prox  83      18      1-39%   calcific                       +----------+--------+--------+--------+----------------------+--------+ ICA Mid   88      23                                             +----------+--------+--------+--------+----------------------+--------+ ICA Distal92      24                                             +----------+--------+--------+--------+----------------------+--------+ ECA       101     0               calcific                       +----------+--------+--------+--------+----------------------+--------+ +----------+--------+--------+----------------+-------------------+           PSV cm/sEDV cm/sDescribe        Arm Pressure (mmHG) +----------+--------+--------+----------------+-------------------+ [id number]      0       Multiphasic, WNL                    +----------+--------+--------+----------------+-------------------+ +---------+--------+--+--------+-+---------+ VertebralPSV cm/s40EDV cm/s7Antegrade +---------+--------+--+--------+-+---------+   Summary: Right Carotid: Velocities in Mario right ICA are consistent with a 1-39% stenosis. Left Carotid: Velocities in Mario left ICA are consistent with a 1-39% stenosis.               Hemodynamically significant plaque >50%  visualized in Mario CCA. Vertebrals:  Bilateral vertebral arteries demonstrate antegrade flow. Subclavians: Normal flow hemodynamics were seen in bilateral subclavian              arteries. *See table(s) above for measurements and observations.  Electronically signed by Harold Barban MD on 01/06/2020 at 1:52:48 PM.    Final    VAS [place] LOWER EXTREMITY BYPASS GRAFT DUPL  Result Date: 01/06/2020 LOWER EXTREMITY ARTERIAL DUPLEX STUDY Indications: Claudication, ulceration, and peripheral artery disease. High Risk Factors: Hypertension, hyperlipidemia, Diabetes, past history of                    smoking.  Vascular Interventions: Left fem-pop bypass graft and ileo-femoral and popliteal  endarterectomy. Current ABI:            ABIs not obtained. Tibial arteries are known to be non                         compressible. Performing Technologist: Delorise Shiner RVT  Examination Guidelines: A complete evaluation includes B-mode imaging, spectral Doppler, color Doppler, and power Doppler as needed of all accessible portions of each vessel. Bilateral testing is considered an integral part of a complete examination. Limited examinations for reoccurring indications may be performed as noted.  +----------+--------+-----+--------+---------+--------+ LEFT      PSV cm/sRatioStenosisWaveform Comments +----------+--------+-----+--------+---------+--------+ CFA Distal74                   triphasic         +----------+--------+-----+--------+---------+--------+  Left Graft #1: +--------------------+--------+--------+---------+--------+                     PSV cm/sStenosisWaveform Comments +--------------------+--------+--------+---------+--------+ Inflow              54              triphasic         +--------------------+--------+--------+---------+--------+ Proximal Anastomosis66              triphasic         +--------------------+--------+--------+---------+--------+ Proximal  Graft      90              triphasic         +--------------------+--------+--------+---------+--------+ Mid Graft           78              biphasic          +--------------------+--------+--------+---------+--------+ Distal Graft        76              biphasic          +--------------------+--------+--------+---------+--------+ Distal Anastomosis  117             triphasic         +--------------------+--------+--------+---------+--------+ Outflow             112             biphasic          +--------------------+--------+--------+---------+--------+   Summary: Left: Patent left fem-pop bypass graft with no evidence of restenosis.  See table(s) above for measurements and observations. Electronically signed by Harold Barban MD on 01/06/2020 at 3:49:08 PM.    Final     Impression:  ClinicalStage IIb (T3a, N0, M0)non-small cell lung cancer presenting in Mario leftupper lung area (squamous cell)    Recent chest CT scan showed a significant interval increase in Mario mass-like consolidation and thickening anteriorly, which was concerning for local recurrence of malignancy. Additionally, there was interval enlargement of left hilar lymph nodes that was concerning for nodal metastatic disease.  I reviewed Mario Mario [name]'s chest CT scan imaging with him today.  I am unsure whether this represents additional scar tissue or possibly recurrence discussed with Mario Mario [name] that we should proceed with repeat PET CT scan for further evaluation.  He agrees  Plan:  PET scan has been ordered for further evaluation of Mario consolidation in Mario left upper chest region.  Additional recommendations pending PET scan results.  Total time spent in this encounter was 22 minutes which included reviewing Mario Mario [name]'s most recent chest CT scan, physical examination, and documentation.  ___________________________________  Blair Promise, PhD, MD  This document serves as a record of services  personally performed by [name], MD. It was created on his behalf by Clerance Lav, a trained medical scribe. Mario creation of this record is based on Mario scribe's personal observations and Mario provider's statements to them. This document has been checked and approved by Mario attending provider.

## 2020-01-23 ENCOUNTER — Ambulatory Visit
Admission: RE | Admit: 2020-01-23 | Discharge: 2020-01-23 | Disposition: A | Discharge status: Home / Self Care | Payer: HMO | Source: Ambulatory Visit | Attending: Radiation Oncology | Admitting: Radiation Oncology

## 2020-01-23 ENCOUNTER — Ambulatory Visit (INDEPENDENT_AMBULATORY_CARE_PROVIDER_SITE_OTHER): Payer: HMO | Admitting: Endocrinology

## 2020-01-23 ENCOUNTER — Encounter: Payer: Self-pay | Admitting: Endocrinology

## 2020-01-23 ENCOUNTER — Other Ambulatory Visit: Payer: Self-pay

## 2020-01-23 ENCOUNTER — Telehealth: Payer: Self-pay | Admitting: *Deleted

## 2020-01-23 ENCOUNTER — Encounter: Payer: Self-pay | Admitting: Radiation Oncology

## 2020-01-23 VITALS — BP 122/58 | HR 71 | Ht 74.0 in | Wt 154.8 lb

## 2020-01-23 DIAGNOSIS — I2510 Atherosclerotic heart disease of native coronary artery without angina pectoris: Secondary | ICD-10-CM | POA: Insufficient documentation

## 2020-01-23 DIAGNOSIS — R590 Localized enlarged lymph nodes: Secondary | ICD-10-CM | POA: Diagnosis not present

## 2020-01-23 DIAGNOSIS — N1831 Chronic kidney disease, stage 3a: Secondary | ICD-10-CM | POA: Diagnosis not present

## 2020-01-23 DIAGNOSIS — R918 Other nonspecific abnormal finding of lung field: Secondary | ICD-10-CM | POA: Diagnosis not present

## 2020-01-23 DIAGNOSIS — E785 Hyperlipidemia, unspecified: Secondary | ICD-10-CM | POA: Diagnosis not present

## 2020-01-23 DIAGNOSIS — Z794 Long term (current) use of insulin: Secondary | ICD-10-CM | POA: Diagnosis not present

## 2020-01-23 DIAGNOSIS — I700 Atherosclerosis of aorta: Secondary | ICD-10-CM | POA: Diagnosis not present

## 2020-01-23 DIAGNOSIS — I10 Essential (primary) hypertension: Secondary | ICD-10-CM | POA: Diagnosis not present

## 2020-01-23 DIAGNOSIS — E1165 Type 2 diabetes mellitus with hyperglycemia: Secondary | ICD-10-CM | POA: Diagnosis not present

## 2020-01-23 DIAGNOSIS — Z7982 Long term (current) use of aspirin: Secondary | ICD-10-CM | POA: Diagnosis not present

## 2020-01-23 DIAGNOSIS — Z79899 Other long term (current) drug therapy: Secondary | ICD-10-CM | POA: Insufficient documentation

## 2020-01-23 DIAGNOSIS — Z85828 Personal history of other malignant neoplasm of skin: Secondary | ICD-10-CM | POA: Diagnosis not present

## 2020-01-23 DIAGNOSIS — Z87891 Personal history of nicotine dependence: Secondary | ICD-10-CM | POA: Diagnosis not present

## 2020-01-23 DIAGNOSIS — C3412 Malignant neoplasm of upper lobe, left bronchus or lung: Secondary | ICD-10-CM | POA: Diagnosis not present

## 2020-01-23 DIAGNOSIS — Z923 Personal history of irradiation: Secondary | ICD-10-CM | POA: Insufficient documentation

## 2020-01-23 DIAGNOSIS — R634 Abnormal weight loss: Secondary | ICD-10-CM | POA: Diagnosis not present

## 2020-01-23 DIAGNOSIS — J439 Emphysema, unspecified: Secondary | ICD-10-CM | POA: Diagnosis not present

## 2020-01-23 DIAGNOSIS — Z08 Encounter for follow-up examination after completed treatment for malignant neoplasm: Secondary | ICD-10-CM | POA: Diagnosis not present

## 2020-01-23 LAB — POCT GLUCOSE (DEVICE FOR HOME USE): POC Glucose: 226 mg/dl — AB (ref 70–99)

## 2020-01-23 NOTE — Progress Notes (Signed)
Patient ID: Mario [name], male   DOB: Sep 16, 1940, 79 y.o.   MRN: [id number]   Reason for Appointment: follow-up   History of Present Illness   Diagnosis: Type 2 DIABETES MELITUS, date of diagnosis:  1989     Previous history: He was initially diagnosed when hospitalized for pancreatitis and his previous endocrinologist had treated him with multiple medications because of progression of his diabetes. He was also put on Cycloset  which he has tolerated maximum dose He has had adjustments of his medication dosages the last couple of years and Januvia was restarted in 5/14 when blood sugars were higher. Dosages have been adjusted based on renal function Previously an A1c levels have been ranging from 6.6-7.5 His metformin was increased  when renal function was better and glipizide was changed to glipizide ER 10 mg . Did not appear to have better blood sugar control with Invokana He was started on Victoza for improved control when his A1c was 7.9% in 02/2015  Recent history:    Non-insulin hypoglycemic drugs:   glipizide ER 10 mg , Metformin 1 g twice a day,  Victoza 1.8 mg daily, Invokana 100 mg daily , Glyset 25 mg  His A1c in 7/21 was 7.5 compared to 7.8  Previously has been as low as 6.7 Fructosamine is 306, previously was 295  Blood sugar patterns and problems:  He was started on LANTUS insulin because of blood sugars as high as 185 fasting despite his multidrug diabetes regimen  However he did not bring his monitor again for review  Again not clear how often he is monitoring his sugars but he thinks he is doing it more in the morning with starting insulin  He went up 2 units of the Lantus after starting 6 units  He thinks his fasting readings are ranging from 120-150  Today however his blood sugar is 226 since he only had cookies for breakfast  Renal function slightly better, continues taking Invokana  He has cut back on carbohydrates in the morning, having  eggs and [place] bacon with English muffin  Now he is going to the gym and doing exercises on his own regularly   Weight is again going down although he reportedly does not have any recurrence of his cancer  Takes Victoza before dinner regularly   Side effects from medications: None.  Monitors blood glucose: Once a day or less .    Glucometer: Freestyle    Blood sugars as above   Meals: 2-3 meals per day, no lunch often (yogurt); breakfast is at 9 am grits, [place] bacon, sometimes bread and eggs at breakfast; donuts occasionally      Dietician consultation last: 3/16          Wt Readings from Last 3 Encounters:  01/23/20 154 lb 12.8 oz (70.2 kg)  01/23/20 155 lb 4 oz (70.4 kg)  01/06/20 158 lb (71.7 kg)    Lab Results  Component Value Date   HGBA1C 7.5 (H) 12/17/2019   HGBA1C 7.8 (H) 09/09/2019   HGBA1C 7.5 (H) 06/10/2019   Lab Results  Component Value Date   MICROALBUR 9.2 (H) 03/04/2019   LDLCALC 40 06/10/2019   CREATININE 1.38 01/21/2020     LABS:  Office Visit on 01/23/2020  Component Date Value Ref Range Status  . POC Glucose 01/23/2020 226* 70 - 99 mg/dl Final  Lab on 01/21/2020  Component Date Value Ref Range Status  . Fructosamine 01/21/2020 306* 0 - 285 umol/L Final  Comment: Published reference interval for apparently healthy subjects between age 57 and 67 is 35 - 285 umol/L and in a poorly controlled diabetic population is 228 - 563 umol/L with a mean of 396 umol/L.   Marland Kitchen Sodium 01/21/2020 138  135 - 145 mEq/L Final  . Potassium 01/21/2020 3.9  3.5 - 5.1 mEq/L Final  . Chloride 01/21/2020 108  96 - 112 mEq/L Final  . CO2 01/21/2020 20  19 - 32 mEq/L Final  . Glucose, Bld 01/21/2020 126* 70 - 99 mg/dL Final  . BUN 01/21/2020 28* 6 - 23 mg/dL Final  . Creatinine, Ser 01/21/2020 1.38  0.40 - 1.50 mg/dL Final  . GFR 01/21/2020 49.73* >60.00 mL/min Final  . Calcium 01/21/2020 9.1  8.4 - 10.5 mg/dL Final    Allergies as of 01/23/2020      Reactions    Penicillins Anaphylaxis   Has patient had a PCN reaction causing immediate rash, facial/tongue/throat swelling, SOB or lightheadedness with hypotension: Yes Has patient had a PCN reaction causing severe rash involving mucus membranes or skin necrosis: No Has patient had a PCN reaction that required hospitalization: Yes Has patient had a PCN reaction occurring within the last 10 years: Unknown If all of the above answers are "NO", then may proceed with Cephalosporin use.   Plavix [clopidogrel Bisulfate] Hives   Pletal [cilostazol] Itching      Medication List       Accurate as of January 23, 2020  3:20 PM. If you have any questions, ask your nurse or doctor.        allopurinol 100 MG tablet Commonly known as: ZYLOPRIM Take 1 tablet by mouth daily What changed: when to take this   ARTIFICIAL TEAR OP Apply 1 drop to eye daily as needed (dry eyes).   aspirin EC 81 MG tablet Take 81 mg by mouth daily.   atorvastatin 40 MG tablet Commonly known as: LIPITOR take 1 tablet by mouth daily at 6:00pm What changed: See the new instructions.   calcium carbonate 500 MG chewable tablet Commonly known as: TUMS - dosed in mg elemental calcium Chew 1 tablet by mouth daily as needed for indigestion or heartburn.   carvedilol 3.125 MG tablet Commonly known as: COREG TAKE 1 TABLET BY MOUTH TWICE A DAY What changed: when to take this   EQ Vision Formula 50+ Caps Take 1 capsule by mouth daily.   fenofibrate micronized 43 MG capsule Commonly known as: ANTARA Take 43 mg by mouth every evening.   ferrous sulfate 325 (65 FE) MG tablet Take 325 mg by mouth daily.   finasteride 5 MG tablet Commonly known as: PROSCAR Take 5 mg by mouth every evening.   folic acid 659 MCG tablet Commonly known as: FOLVITE Take 800 mcg by mouth daily.   freestyle lancets Use as instructed to check blood sugar 2 times per day dx code E11.65   FreeStyle Lite Devi USE TO TEST BLOOD SUGAR TWICE DAILY.  DX:E11.65   FreeStyle Freedom Lite w/Device Kit USE TO check blood sugar TWICE DAILY   glucose blood test strip Commonly known as: FREESTYLE LITE Use as instructed to check blood sugar 2 times per day dx code E11.65   Insulin Pen Needle 31G X 5 MM Misc Use once a day to inject Lantus   Invokana 100 MG Tabs tablet Generic drug: canagliflozin TAKE ONE TABLET BY MOUTH ONCE DAILY BEFORE BREAKFAST   Lantus SoloStar 100 UNIT/ML Solostar Pen Generic drug: insulin glargine Inject 10  Units into the skin daily.   metFORMIN 1000 MG tablet Commonly known as: GLUCOPHAGE TAKE 1 TABLET BY MOUTH TWICE DAILY FOR BREAKFAST AND DINNER FOR DIABETES. What changed: See the new instructions.   miglitol 50 MG tablet Commonly known as: GLYSET Take 50 mg by mouth 2 (two) times daily.   Qunol CoQ10/Ubiquinol/Mega 100 MG Caps Generic drug: Ubiquinol Take 1 capsule by mouth daily.   tamsulosin 0.4 MG Caps capsule Commonly known as: FLOMAX Take 0.4 mg by mouth 2 (two) times daily.   Victoza 18 MG/3ML Sopn Generic drug: liraglutide INJECT 1.8MG INTO THE SKIN DAILYAT DINNERTIME FOR DIABETES.   vitamin B-12 1000 MCG tablet Commonly known as: CYANOCOBALAMIN Take 1,000 mcg by mouth daily.   Vitamin D 50 MCG (2000 UT) Caps Take 2,000 Units by mouth daily.       Allergies:  Allergies  Allergen Reactions  . Penicillins Anaphylaxis    Has patient had a PCN reaction causing immediate rash, facial/tongue/throat swelling, SOB or lightheadedness with hypotension: Yes Has patient had a PCN reaction causing severe rash involving mucus membranes or skin necrosis: No Has patient had a PCN reaction that required hospitalization: Yes Has patient had a PCN reaction occurring within the last 10 years: Unknown If all of the above answers are "NO", then may proceed with Cephalosporin use.   Marland Kitchen Plavix [Clopidogrel Bisulfate] Hives  . Pletal [Cilostazol] Itching    Past Medical History:  Diagnosis Date  .  Anemia   . Arthritis   . CAD (coronary artery disease)    a.  s/p IMI and BMS 1997;  b. Myoview 4/16:  inf scar with peri-infarct ischemia, EF 34%; high risk;  c. LHC 5/16:  3 v CAD >> CABG (free L-LAD, S-OM, S-dRCA)  . Cancer ([place])    skin cancer  . Carotid stenosis    a. carotid US 6/16:  bilat ICA 1-39%  . Chronic kidney disease, stage II (mild)   . Chronic systolic CHF (congestive heart failure) ([place])   . COPD (chronic obstructive pulmonary disease) ([place])   . Essential hypertension, benign   . Gout   . HLD (hyperlipidemia)   . Inguinal adenopathy 09/19/2018  . Ischemic cardiomyopathy    a. EF by myoview 4/16 34%; b. LHC 5/16: EF 40-45%;  c. intraop TEE 6/16: EF 45-50%  . Orthostasis   . PAD (peripheral artery disease) ([facility])    Dr. Fletcher Anon  . Pneumonia    hx of  . Type II or unspecified type diabetes mellitus without mention of complication, uncontrolled     Past Surgical History:  Procedure Laterality Date  . ABDOMINAL AORTAGRAM N/A 10/09/2013   Procedure: ABDOMINAL Maxcine Ham;  Surgeon: Wellington Hampshire, MD;  Location: [facility] CATH [facility];  Service: Cardiovascular;  Laterality: N/A;  . ABDOMINAL AORTOGRAM N/A 12/28/2016   Procedure: ABDOMINAL AORTOGRAM;  Surgeon: Waynetta Sandy, MD;  Location: [facility];  Service: Cardiovascular;  Laterality: N/A;  . ABDOMINAL AORTOGRAM W/LOWER EXTREMITY Bilateral 09/11/2018   Procedure: ABDOMINAL AORTOGRAM W/LOWER EXTREMITY;  Surgeon: Serafina Mitchell, MD;  Location: [facility];  Service: Cardiovascular;  Laterality: Bilateral;  . CARDIAC CATHETERIZATION N/A 10/02/2014   Procedure: Left Heart Cath and Coronary Angiography;  Surgeon: Belva Crome, MD;  Location: [facility];  Service: Cardiovascular;  Laterality: N/A;  . CATARACT EXTRACTION    . CHOLECYSTECTOMY    . COLONOSCOPY    . CORONARY ARTERY BYPASS GRAFT N/A 11/03/2014   Procedure: CORONARY ARTERY  BYPASS GRAFTING  times three using left internal mammary and right  greater saphenous vein;  Surgeon: Gaye Pollack, MD;  Location: [facility] OR;  Service: Open Heart Surgery;  Laterality: N/A;  . FEMORAL-POPLITEAL BYPASS GRAFT Left 09/18/2018   Procedure: LEFT ILEO FEMORAL ENDARTERECTOMY WITH VEIN PATCH AND ANGIOPLASTY, POPLITEAL  ENDARTERECTOMY, FEM POP BYPASS;  Surgeon: Serafina Mitchell, MD;  Location: [facility];  Service: Vascular;  Laterality: Left;  . HERNIA REPAIR    . LOWER EXTREMITY ANGIOGRAPHY Bilateral 12/28/2016   Procedure: Lower Extremity Angiography;  Surgeon: Waynetta Sandy, MD;  Location: [facility] CV [facility];  Service: Cardiovascular;  Laterality: Bilateral;  . LYMPH NODE DISSECTION Right 09/19/2018   Procedure: SUPERFICIAL RIGHT LYMPH NODE DISSECTION;  Surgeon: Fanny Skates, MD;  Location: [facility];  Service: General;  Laterality: Right;  . MELANOMA EXCISION    . PERIPHERAL VASCULAR BALLOON ANGIOPLASTY Right 12/28/2016   Procedure: PERIPHERAL VASCULAR BALLOON ANGIOPLASTY;  Surgeon: Waynetta Sandy, MD;  Location: [facility];  Service: Cardiovascular;  Laterality: Right;  SFA UNSUCCESSFUL PTA  UNABLE TO CROSS LESION  . SKIN LESION EXCISION     multiple  . TEE WITHOUT CARDIOVERSION N/A 11/03/2014   Procedure: TRANSESOPHAGEAL ECHOCARDIOGRAM (TEE);  Surgeon: Gaye Pollack, MD;  Location: [facility];  Service: Open Heart Surgery;  Laterality: N/A;  . VIDEO BRONCHOSCOPY WITH ENDOBRONCHIAL NAVIGATION N/A 11/22/2018   Procedure: VIDEO BRONCHOSCOPY WITH ENDOBRONCHIAL NAVIGATION;  Surgeon: [name], MD;  Location: [facility] OR;  Service: Thoracic;  Laterality: N/A;    Family History  Problem Relation Age of Onset  . Colon cancer Father   . Heart disease Mother   . Non-Hodgkin's lymphoma Daughter     Social History:  reports that he quit smoking about 5 years ago. His smoking use included cigarettes. He has a 25.00 pack-year smoking history. He has never used smokeless tobacco. He reports that he does not drink alcohol and does not use  drugs.  Review of Systems:   Hypertension:  Was taking lisinopril 5 mg and carvedilol low-dose With starting Invokana his lisinopril has been stopped  BP Readings from Last 3 Encounters:  01/23/20 (!) 122/58  01/23/20 (!) 147/63  01/06/20 129/72   Renal function is further improved, slightly higher initially with starting Invokana I  Lab Results  Component Value Date   CREATININE 1.38 01/21/2020   CREATININE 1.48 12/17/2019   CREATININE 1.54 (H) 10/15/2019     Lipids: Well controlled with atorvastatin 40 mg, LDL below 70, as before HDL is still low     Lab Results  Component Value Date   CHOL 100 06/10/2019   HDL 36.10 (L) 06/10/2019   LDLCALC 40 06/10/2019   TRIG 118.0 06/10/2019   CHOLHDL 3 06/10/2019    His last Pneumovax was in 2020    Examination:   BP (!) 122/58 (BP Location: Left Arm, Patient Position: Sitting, Cuff Size: Normal)   Pulse 71   Ht 6' 2"  (1.88 m)   Wt 154 lb 12.8 oz (70.2 kg)   SpO2 99%   BMI 19.88 kg/m   Body mass index is 19.88 kg/m.    ASSESSMENT/ PLAN:   Diabetes type 2, nonobese:   See history of present illness for detailed discussion of his current management, blood sugar patterns and problems identified  His A1c is last 7.5, previously was 7.8 Fructosamine is 306  Blood sugars are appearing to be better especially fasting although he did not bring meter again  Although his fasting readings are fairly good including 126 in the lab compared to 185 he is doing better with only 8 units of Lantus insulin once a day  He will stay on the same regimen except take Glyset at dinnertime which is his main meal, currently getting balanced meals in the morning at breakfast If his fasting blood sugars are starting to go up at least over 140 he will increase Lantus another 2 units Reminded him to check readings after meals Reduce Metformin to half tablet in the evening considering his mild renal dysfunction Stay on Invokana  Renal  function: Slightly better  Weight loss: He needs to discuss this with his PCP, however may consider reducing Victoza   Patient Instructions  Metformin only 1/2 pill at dinner  Miglitol with dinner  Check blood sugars on waking up 3-4days a week  Also check blood sugars about 2 hours after meals and do this after different meals by rotation  Recommended blood sugar levels on waking up are 90-130 and about 2 hours after meal is 130-160  Please bring your blood sugar monitor to each visit, thank you        [name] 01/23/2020, 3:20 PM

## 2020-01-23 NOTE — Progress Notes (Signed)
Patient here today for a f/u visit and to review his most recent CT results. Patient is short of breath and reports having a cough that is occasionally productive.Reports a good appetite.  BP (!) 147/63 (BP Location: Left Arm, Patient Position: Sitting)   Pulse 72   Temp (!) 97.3 F (36.3 C) (Temporal)   Resp 20   Ht 6\' 2"  (1.88 m)   Wt 155 lb 4 oz (70.4 kg)   SpO2 100%   BMI 19.93 kg/m    Wt Readings from Last 3 Encounters:  01/23/20 155 lb 4 oz (70.4 kg)  01/06/20 158 lb (71.7 kg)  12/19/19 159 lb (72.1 kg)

## 2020-01-23 NOTE — Telephone Encounter (Signed)
CALLED PATIENT TO INFORM OF PET SCAN FOR 01-30-20- ARRIVAL TIME- 6:30  AM @ [facility], PATIENT TO HAVE WATER ONLY- 6 HRS. PRIOR TO TEST, PATIENT TO FU WITH DR. KINARD FOR RESULTS ON 02-03-20 @ 3:30 PM, NO ANSWER, NO ANSWERING MACHINE, WILL CALL LATER

## 2020-01-23 NOTE — Telephone Encounter (Signed)
Called patient to inform of Pet and fu, lvm for a return call

## 2020-01-23 NOTE — Patient Instructions (Addendum)
Metformin only 1/2 pill at dinner  Miglitol with dinner  Check blood sugars on waking up 3-4days a week  Also check blood sugars about 2 hours after meals and do this after different meals by rotation  Recommended blood sugar levels on waking up are 90-130 and about 2 hours after meal is 130-160  Please bring your blood sugar monitor to each visit, thank you

## 2020-01-24 ENCOUNTER — Encounter: Payer: Self-pay | Admitting: Podiatry

## 2020-01-24 ENCOUNTER — Ambulatory Visit: Payer: HMO | Admitting: Podiatry

## 2020-01-24 ENCOUNTER — Other Ambulatory Visit: Payer: Self-pay | Admitting: Podiatry

## 2020-01-24 DIAGNOSIS — E1140 Type 2 diabetes mellitus with diabetic neuropathy, unspecified: Secondary | ICD-10-CM | POA: Insufficient documentation

## 2020-01-24 DIAGNOSIS — L02611 Cutaneous abscess of right foot: Secondary | ICD-10-CM | POA: Diagnosis not present

## 2020-01-24 DIAGNOSIS — L03031 Cellulitis of right toe: Secondary | ICD-10-CM | POA: Diagnosis not present

## 2020-01-24 DIAGNOSIS — L03119 Cellulitis of unspecified part of limb: Secondary | ICD-10-CM | POA: Diagnosis not present

## 2020-01-24 DIAGNOSIS — L02619 Cutaneous abscess of unspecified foot: Secondary | ICD-10-CM

## 2020-01-24 DIAGNOSIS — L84 Corns and callosities: Secondary | ICD-10-CM | POA: Diagnosis not present

## 2020-01-24 DIAGNOSIS — E1142 Type 2 diabetes mellitus with diabetic polyneuropathy: Secondary | ICD-10-CM | POA: Diagnosis not present

## 2020-01-24 DIAGNOSIS — L03039 Cellulitis of unspecified toe: Secondary | ICD-10-CM | POA: Insufficient documentation

## 2020-01-24 MED ORDER — DOXYCYCLINE HYCLATE 100 MG PO TABS: 100.0000 mg | ORAL_TABLET | Freq: Two times a day (BID) | ORAL | 0 refills | Status: AC

## 2020-01-24 MED ORDER — DOXYCYCLINE HYCLATE 100 MG PO TABS: 100.0000 mg | ORAL_TABLET | Freq: Two times a day (BID) | ORAL | 0 refills | Status: DC

## 2020-01-24 NOTE — Progress Notes (Addendum)
This patient presents  to my office for at risk foot care.  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 This patient presents to the office saying the callus under the outside of his right foot is painful when walking.  He says the pain has been present for over one year.  He has worn a Dr.  Zoe Lan pad over the callus on the outside of his right foot. This patient presents for at risk foot care today.  General Appearance  Alert, conversant and in no acute stress.  Vascular  Dorsalis pedis and posterior tibial  pulses are palpable  bilaterally.  Capillary return is within normal limits  bilaterally. Temperature is within normal limits  bilaterally.  Neurologic  Senn-Weinstein monofilament wire test diminished   bilaterally. Muscle power within normal limits bilaterally.  Nails Nails on both feet are normal.. No evidence of bacterial infection or drainage bilaterally.  Orthopedic  No limitations of motion  feet .  No crepitus or effusions noted.  No bony pathology or digital deformities noted. Plantar flexed fifth MPJ  B/L.  Skin  normotropic skin with noted bilaterally.  Porokeratosis sub 5th  B/L with fluctuance under his callus and at the dorsolateral aspect 5th metatarsal right  Foot. Examination of fourth toe left foot reveals skin laceration with marked redness tip of fourth toe left foot. Patient dsays this was self inflicted.  Cellulitis 4th toe left foot.  Abscess/cellulitis sub 5th MPJ right foot.    IE.   Incision and drainage 5th abscess right foot.  Culture and sensitivity abscess right foot.  Neosporin/DSD.  Xray right foot was performed.  There is a possible cystic lesion head fifth metatarsal head right foot.  Discussed this condition with this patient.  Prescribed doxycycline for both his abscess right foot and cellulitis fourth toe left foot.  Home soaking instructions  given.  RTC 7 days.   Gardiner Barefoot DPM                    Gardiner Barefoot DPM

## 2020-01-28 ENCOUNTER — Telehealth: Payer: Self-pay | Admitting: Internal Medicine

## 2020-01-28 NOTE — Telephone Encounter (Signed)
Pt said [facility] called him and said they need to know if Dr. Dwyane Dee wants patient to continue to take miglitol. If so, they need to know how often he needs to take. Patient would like for you to call pharmacy and get this clarified.

## 2020-01-28 NOTE — Telephone Encounter (Signed)
He was told to take his once at dinnertime

## 2020-01-28 NOTE — Telephone Encounter (Signed)
Please advise 

## 2020-01-29 ENCOUNTER — Encounter: Payer: Self-pay | Admitting: Podiatry

## 2020-01-29 ENCOUNTER — Other Ambulatory Visit: Payer: Self-pay

## 2020-01-29 ENCOUNTER — Telehealth: Payer: Self-pay | Admitting: Endocrinology

## 2020-01-29 ENCOUNTER — Ambulatory Visit: Payer: HMO | Admitting: Podiatry

## 2020-01-29 DIAGNOSIS — E1142 Type 2 diabetes mellitus with diabetic polyneuropathy: Secondary | ICD-10-CM

## 2020-01-29 DIAGNOSIS — L02619 Cutaneous abscess of unspecified foot: Secondary | ICD-10-CM

## 2020-01-29 DIAGNOSIS — M79674 Pain in right toe(s): Secondary | ICD-10-CM

## 2020-01-29 DIAGNOSIS — L03031 Cellulitis of right toe: Secondary | ICD-10-CM

## 2020-01-29 DIAGNOSIS — M79675 Pain in left toe(s): Secondary | ICD-10-CM | POA: Diagnosis not present

## 2020-01-29 DIAGNOSIS — I739 Peripheral vascular disease, unspecified: Secondary | ICD-10-CM

## 2020-01-29 DIAGNOSIS — B351 Tinea unguium: Secondary | ICD-10-CM | POA: Insufficient documentation

## 2020-01-29 DIAGNOSIS — L02611 Cutaneous abscess of right foot: Secondary | ICD-10-CM

## 2020-01-29 DIAGNOSIS — L03119 Cellulitis of unspecified part of limb: Secondary | ICD-10-CM

## 2020-01-29 LAB — WOUND CULTURE
MICRO NUMBER:: 10913629
SPECIMEN QUALITY:: ADEQUATE

## 2020-01-29 LAB — HOUSE ACCOUNT TRACKING

## 2020-01-29 MED ORDER — MIGLITOL 50 MG PO TABS
50.0000 mg | ORAL_TABLET | Freq: Every day | ORAL | 3 refills | Status: DC
Start: 2020-01-29 — End: 2020-05-04

## 2020-01-29 NOTE — Telephone Encounter (Signed)
According to last office visit, pt reports that he was taking Glyset 25mg  once daily. MD instructions at end of does not indicate a change in dosage, only to begin taking at dinnertime, since it is the pt's main meal of the day. New Rx has been sent to reflect pt as taking 25mg  PO QD with supper.

## 2020-01-29 NOTE — Progress Notes (Signed)
This patient presents to the office 5 days after diagnosing a abscess subfifth metatarsal right foot.  Patient was treated 5 days ago with incision and drainage of the abscess a prescription for doxycycline and instructions for home soaks.  He presents the office today stating he is not having any pain or discomfort and there is no drainage coming from the bottom of the outside right foot.  He says he has been soaking his foot faithfully and applying a bandage.  He is pleased with his improvement and states most of the pain resolved upon drainage of the abscess last Friday.  Since this patient is a diabetic with neuropathy and has PAD I told him to return to the office this week for further evaluation and treatment of the site of the abscess right foot.  Examination of the culture reveals that he is sensitive to Bactrim and I had described doxycycline.   The lab said there is a heavy Staphylococcus aureus growth patient has a problem list which includes diabetic neuropathy peripheral artery disease and chronic kidney disease.   Vascular  Dorsalis pedis and posterior tibial pulses are palpable  B/L.  Capillary return  WNL.  Temperature gradient is  WNL.  Skin turgor  WNL  Sensorium  Senn Weinstein monofilament wire  Diminished . Normal tactile sensation.  Nail Exam  Patient has thick disfigured discolored nails  B/L.    No evidence of bacterial infection or drainage.  Orthopedic  Exam  Muscle tone and muscle strength  WNL.  No limitations of motion feet  B/L.  No crepitus or joint effusion noted.  Foot type is unremarkable and digits show no abnormalities.  Plantar flexed fifth metatarsal right foot.  Skin  No open lesions.  Normal skin texture and turgor.  Two healing skin wounds plantarly sub 5th met right foot.  Open wound on dorsolateral aspect fifth metatarsal right foot. No evidence of redness or drainage in open wound. No cellulitis fourth toe left foot.   Cellulitis has resolved.  S/P  Cellulitis/Abscess right foot.  ROV.  Debride thick nails B/L.  Evaluation of the open wounds in the abscess revealed they are healing at this time.  Examination of the bandage that he has worn reveals no evidence of any drainage.  Patient was told to continue his antibiotics until they are finished.  He was told he can now bandage this o wound on the outside of the right foot to be bandaged with Neosporin and a Band-Aid.  Proceeded to talk to the pedorthist   and he is going to make dispersion orthoses for this patient in the future.  These orthoses will help to prevent further breakdown of the skin subfifth metatarsal right foot and future infections.  RTC 10 weeks for nail care.  Call the office if the infection returns.   Gardiner Barefoot DPM

## 2020-01-29 NOTE — Telephone Encounter (Signed)
Pt is aware.  

## 2020-01-29 NOTE — Telephone Encounter (Signed)
Jayme from [facility] called to request a new updated RX for miglitol (GLYSET)  MG tablet with updated dosage and directions be sent to (Patient is not sure of dosage and directions-per [facility]):  [facility] - [place], [place] - [address] Phone:  [phone]  Fax:  [phone]

## 2020-01-30 ENCOUNTER — Ambulatory Visit (HOSPITAL_COMMUNITY)
Admission: RE | Admit: 2020-01-30 | Discharge: 2020-01-30 | Disposition: A | Discharge status: Home / Self Care | Payer: HMO | Source: Ambulatory Visit | Attending: Radiation Oncology | Admitting: Radiation Oncology

## 2020-01-30 DIAGNOSIS — J439 Emphysema, unspecified: Secondary | ICD-10-CM | POA: Diagnosis not present

## 2020-01-30 DIAGNOSIS — J984 Other disorders of lung: Secondary | ICD-10-CM | POA: Diagnosis not present

## 2020-01-30 DIAGNOSIS — C3490 Malignant neoplasm of unspecified part of unspecified bronchus or lung: Secondary | ICD-10-CM | POA: Diagnosis not present

## 2020-01-30 DIAGNOSIS — C3412 Malignant neoplasm of upper lobe, left bronchus or lung: Secondary | ICD-10-CM | POA: Insufficient documentation

## 2020-01-30 DIAGNOSIS — I700 Atherosclerosis of aorta: Secondary | ICD-10-CM | POA: Diagnosis not present

## 2020-01-30 LAB — GLUCOSE, CAPILLARY: Glucose-Capillary: 113 mg/dL — ABNORMAL HIGH (ref 70–99)

## 2020-01-30 MED ORDER — FLUDEOXYGLUCOSE F - 18 (FDG) INJECTION
7.9900 | Freq: Once | INTRAVENOUS | Status: AC | PRN
  Administered 2020-01-30: 7.99 via INTRAVENOUS

## 2020-02-03 ENCOUNTER — Ambulatory Visit
Admission: RE | Admit: 2020-02-03 | Discharge: 2020-02-03 | Disposition: A | Discharge status: Home / Self Care | Payer: HMO | Source: Ambulatory Visit | Attending: Radiation Oncology | Admitting: Radiation Oncology

## 2020-02-03 ENCOUNTER — Encounter: Payer: Self-pay | Admitting: Radiation Oncology

## 2020-02-03 ENCOUNTER — Other Ambulatory Visit: Payer: Self-pay

## 2020-02-03 VITALS — BP 160/69 | HR 73 | Temp 97.0°F | Resp 20 | Ht 74.0 in | Wt 157.4 lb

## 2020-02-03 DIAGNOSIS — Z794 Long term (current) use of insulin: Secondary | ICD-10-CM | POA: Insufficient documentation

## 2020-02-03 DIAGNOSIS — E11621 Type 2 diabetes mellitus with foot ulcer: Secondary | ICD-10-CM | POA: Diagnosis not present

## 2020-02-03 DIAGNOSIS — I10 Essential (primary) hypertension: Secondary | ICD-10-CM | POA: Insufficient documentation

## 2020-02-03 DIAGNOSIS — E1151 Type 2 diabetes mellitus with diabetic peripheral angiopathy without gangrene: Secondary | ICD-10-CM | POA: Diagnosis not present

## 2020-02-03 DIAGNOSIS — Z87891 Personal history of nicotine dependence: Secondary | ICD-10-CM | POA: Insufficient documentation

## 2020-02-03 DIAGNOSIS — C3412 Malignant neoplasm of upper lobe, left bronchus or lung: Secondary | ICD-10-CM | POA: Diagnosis not present

## 2020-02-03 DIAGNOSIS — E785 Hyperlipidemia, unspecified: Secondary | ICD-10-CM | POA: Diagnosis not present

## 2020-02-03 DIAGNOSIS — I2510 Atherosclerotic heart disease of native coronary artery without angina pectoris: Secondary | ICD-10-CM | POA: Diagnosis not present

## 2020-02-03 DIAGNOSIS — C3492 Malignant neoplasm of unspecified part of left bronchus or lung: Secondary | ICD-10-CM

## 2020-02-03 DIAGNOSIS — I700 Atherosclerosis of aorta: Secondary | ICD-10-CM | POA: Insufficient documentation

## 2020-02-03 DIAGNOSIS — Z79899 Other long term (current) drug therapy: Secondary | ICD-10-CM | POA: Diagnosis not present

## 2020-02-03 DIAGNOSIS — Z923 Personal history of irradiation: Secondary | ICD-10-CM | POA: Diagnosis not present

## 2020-02-03 DIAGNOSIS — Z712 Person consulting for explanation of examination or test findings: Secondary | ICD-10-CM | POA: Diagnosis not present

## 2020-02-03 DIAGNOSIS — J439 Emphysema, unspecified: Secondary | ICD-10-CM | POA: Diagnosis not present

## 2020-02-03 NOTE — Progress Notes (Signed)
Radiation Oncology         [phone] ________________________________  Name: Mario [name] MRN: [id number]  Date: 02/03/2020  DOB: [birth year]  Follow-Up Visit Note  CC: [name], MD  [name], MD    ICD-10-CM   1. Primary cancer of left upper lobe of lung ([place])  C34.12   2. 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  Interval Since Last Radiation: One year, one month, two weeks, and six days.  12/05/2018 - 12/14/2018: LUL / 60 Gy in 5 fractions (SBRT)  Narrative:  The patient returns today for follow-up and to discuss recent PET scan results. PET scan was performed on 01/30/2020 and showed locally recurrent disease within the left upper lobe, as evidenced by progressive soft tissue density and hypermetabolism. It also showed left-sided mediastinal hypermetabolism, likely related to nodal metastasis. There was no hypermetabolic extrathoracic metastasis.   On review of systems, he reports occasional dry cough. He denies shortness of breath and chest pain.  ALLERGIES:  is allergic to penicillins, plavix [clopidogrel bisulfate], and pletal [cilostazol].  Meds: Current Outpatient Medications  Medication Sig Dispense Refill  . allopurinol (ZYLOPRIM) 100 MG tablet Take 1 tablet by mouth daily (Patient taking differently: Take 100 mg by mouth at bedtime. ) 90 tablet 0  . ARTIFICIAL TEAR OP Apply 1 drop to eye daily as needed (dry eyes).    Marland Kitchen aspirin EC 81 MG tablet Take 81 mg by mouth daily.    Marland Kitchen atorvastatin (LIPITOR) 40 MG tablet take 1 tablet by mouth daily at 6:00pm  (Patient taking differently: Take 40 mg by mouth at bedtime. ) 90 tablet 0  . Blood Glucose Monitoring Suppl (FREESTYLE FREEDOM LITE) w/Device KIT USE TO  check blood sugar TWICE DAILY    . Blood Glucose Monitoring Suppl (FREESTYLE LITE) DEVI USE TO TEST BLOOD SUGAR TWICE DAILY. DX:E11.65 1 each 1  . calcium carbonate (TUMS - DOSED IN MG ELEMENTAL CALCIUM) 500 MG chewable tablet Chew 1 tablet by mouth daily as needed for indigestion or heartburn.    . carvedilol (COREG) 3.125 MG tablet TAKE 1 TABLET BY MOUTH TWICE A DAY (Patient taking differently: Take 3.125 mg by mouth 2 (two) times daily with a meal. ) 180 tablet 3  . Cholecalciferol (VITAMIN D) 2000 units CAPS Take 2,000 Units by mouth daily.    Marland Kitchen doxycycline (VIBRA-TABS) 100 MG tablet Take 1 tablet (100 mg total) by mouth 2 (two) times daily for 10 days. 20 tablet 0  . fenofibrate micronized (ANTARA) 43 MG capsule Take 43 mg by mouth every evening.    . ferrous sulfate 325 (65 FE) MG tablet Take 325 mg by mouth daily.    . finasteride (PROSCAR) 5 MG tablet Take 5 mg by mouth every evening.   3  . folic acid (FOLVITE) 734 MCG tablet Take 800 mcg by mouth daily.     Marland Kitchen glucose blood (FREESTYLE LITE) test strip Use as instructed to check blood sugar 2 times per day dx code E11.65 100 each 3  . insulin glargine (LANTUS SOLOSTAR) 100 UNIT/ML Solostar Pen Inject 10 Units into the skin daily. 5 pen 1  . Insulin Pen Needle 31G X 5 MM MISC Use once a day to inject Lantus 50 each 1  . INVOKANA  100 MG TABS tablet TAKE ONE TABLET BY MOUTH ONCE DAILY BEFORE BREAKFAST 30 tablet 3  . Lancets (FREESTYLE) lancets Use as instructed to check blood sugar 2 times per day dx code E11.65 100 each 3  . metFORMIN (GLUCOPHAGE) 1000 MG tablet TAKE 1 TABLET BY MOUTH TWICE DAILY FOR BREAKFAST AND DINNER FOR DIABETES. (Patient taking differently: Take 1,000 mg by mouth 2 (two) times daily with a meal. ) 60 tablet 2  . miglitol (GLYSET) 50 MG tablet Take 1 tablet (50 mg total) by mouth daily with supper. 30 tablet 3  . Multiple Vitamins-Minerals (EQ VISION FORMULA 50+) CAPS Take 1 capsule by mouth daily.    . tamsulosin  (FLOMAX) 0.4 MG CAPS capsule Take 0.4 mg by mouth 2 (two) times daily.    Marland Kitchen Ubiquinol (QUNOL COQ10/UBIQUINOL/MEGA) 100 MG CAPS Take 1 capsule by mouth daily.    Marland Kitchen VICTOZA 18 MG/3ML SOPN INJECT 1.8MG INTO THE SKIN DAILYAT DINNERTIME FOR DIABETES. 9 mL 3  . vitamin B-12 (CYANOCOBALAMIN) 1000 MCG tablet Take 1,000 mcg by mouth daily.     No current facility-administered medications for this encounter.    Physical Findings: The patient is in no acute distress. Patient is alert and oriented.  height is 6' 2"  (1.88 m) and weight is 157 lb 7 oz (71.4 kg). His oral temperature is 97 F (36.1 C) (abnormal). His blood pressure is 160/69 (abnormal) and his pulse is 73. His respiration is 20 and oxygen saturation is 99%.  Lungs are clear to auscultation bilaterally. Heart has regular rate and rhythm. No palpable cervical, supraclavicular, or axillary adenopathy. Abdomen soft, non-tender, normal bowel sounds.   Lab Findings: Lab Results  Component Value Date   WBC 7.1 11/19/2018   HGB 9.8 (L) 11/19/2018   HCT 32.7 (L) 11/19/2018   MCV 90.6 11/19/2018   PLT 200 11/19/2018    Radiographic Findings: CT Chest Wo Contrast  Result Date: 01/20/2020 CLINICAL DATA:  Non-small cell lung cancer, assess treatment response EXAM: CT CHEST WITHOUT CONTRAST TECHNIQUE: Multidetector CT imaging of the chest was performed following the standard protocol without IV contrast. COMPARISON:  PET-CT, 07/15/2019, CT chest, 04/08/2019 FINDINGS: Cardiovascular: Aortic atherosclerosis. Normal heart size. Extensive 3 vessel coronary artery calcification in or stents. Dense mitral annulus calcifications. No pericardial effusion. Mediastinum/Nodes: Interval enlargement of left hilar lymph nodes, measuring up to 2.0 x 1.1 cm, previously 1.5 x 0.8 cm (series 2, image 58). Thyroid gland, trachea, and esophagus demonstrate no significant findings. Lungs: Moderate centrilobular emphysema. Redemonstrated dense paramedian consolidation and  fibrosis of the anterior left upper lobe. There is a significant interval increase in masslike consolidation and thickening anteriorly, which is generally greater than expected for radiation fibrosis, the most discrete component measuring approximately 4.4 x 3.6 cm (series 7, image 50). No pleural effusion or pneumothorax. Upper Abdomen: No acute abnormality. Musculoskeletal: No chest wall mass or suspicious bone lesions identified. IMPRESSION: 1. Redemonstrated dense paramedian consolidation and fibrosis of the anterior left upper lobe. There is a significant interval increase in masslike consolidation and thickening anteriorly, which is generally greater than expected for radiation fibrosis, the most discrete component measuring approximately 4.4 x 3.6 cm. Findings are concerning for local recurrence of malignancy. PET-CT may be helpful to assess for metabolic activity. 2. Interval enlargement of left hilar lymph nodes, concerning for nodal metastatic disease. 3. Emphysema (ICD10-J43.9). 4. Coronary artery disease. Aortic Atherosclerosis (ICD10-I70.0). Electronically Signed   By: Eddie Candle M.D.   On: 01/20/2020 16:07   NM  PET Image Restag (PS) Skull Base To Thigh  Result Date: 01/30/2020 CLINICAL DATA:  Subsequent treatment strategy for non-small-cell lung cancer, evaluate treatment response. COVID vaccination in left arm in February. Status post SBRT of left upper lobe primary. EXAM: NUCLEAR MEDICINE PET SKULL BASE TO THIGH TECHNIQUE: 8.0 mCi F-18 FDG was injected intravenously. Full-ring PET imaging was performed from the skull base to thigh after the radiotracer. CT data was obtained and used for attenuation correction and anatomic localization. Fasting blood glucose: 113 mg/dl COMPARISON:  07/15/2019 FINDINGS: Mediastinal blood pool activity: SUV max 2.0 Liver activity: SUV max NA NECK: No areas of abnormal hypermetabolism. Incidental CT findings: Cerebral atrophy. Bilateral advanced carotid  atherosclerosis. No cervical adenopathy. Left maxillary sinus mucous retention cyst. CHEST: Enlargement of soft tissue density with progressive hypermetabolism within the anterior left upper lobe, consistent with locally recurrent disease. Example soft tissue mass measuring 3.3 x 5.9 cm and a S.U.V. max of 7.6 on 31/8. More inferior and lateral component or satellite nodule measures 2.2 x 1.2 cm and a S.U.V. max of 8.9 on 35/8. An AP window node measures 9 mm and a S.U.V. max of 4.0 on 74/4. 7 mm and not hypermetabolic on the prior. Hypermetabolism which is presumably nodal identified within the left side of the mediastinum, adjacent the pulmonary outflow tract. This measures a S.U.V. max of 6.0 on 79/4. Incidental CT findings: Trace left pleural fluid. Advanced aortic and branch vessel atherosclerosis. Advanced bullous emphysema. ABDOMEN/PELVIS: No abdominopelvic parenchymal or nodal hypermetabolism. Incidental CT findings: Normal adrenal glands. Cholecystectomy. Advanced abdominal aortic atherosclerosis. Ventral pelvic wall hernia repair. SKELETON: No abnormal marrow activity. Incidental CT findings: none IMPRESSION: 1. Locally recurrent disease within the left upper lobe, as evidenced by progressive soft tissue density and hypermetabolism. 2. Left-sided mediastinal hypermetabolism, likely related to nodal metastasis. 3. No hypermetabolic extrathoracic metastatic disease. 4. Incidental findings, including: Aortic atherosclerosis (ICD10-I70.0) and emphysema (ICD10-J43.9). Electronically Signed   By: Abigail Miyamoto M.D.   On: 01/30/2020 10:14   VAS US CAROTID  Result Date: 01/06/2020 Carotid Arterial Duplex Study Indications:       Carotid artery disease. Risk Factors:      Hypertension, hyperlipidemia, Diabetes, past history of                    smoking, coronary artery disease. Other Factors:     Left fem-pop bypass graft. Comparison Study:  Prior carotid duplex 11/05/2018 showed 1-39% ICA stenosis                     bilaterally. Performing Technologist: Delorise Shiner RVT  Examination Guidelines: A complete evaluation includes B-mode imaging, spectral Doppler, color Doppler, and power Doppler as needed of all accessible portions of each vessel. Bilateral testing is considered an integral part of a complete examination. Limited examinations for reoccurring indications may be performed as noted.  Right Carotid Findings: +----------+--------+--------+--------+--------------------------+--------+           PSV cm/sEDV cm/sStenosisPlaque Description        Comments +----------+--------+--------+--------+--------------------------+--------+ CCA Prox  85      0                                                  +----------+--------+--------+--------+--------------------------+--------+ CCA Mid   75      9  irregular                          +----------+--------+--------+--------+--------------------------+--------+ CCA Distal64      0               calcific                           +----------+--------+--------+--------+--------------------------+--------+ ICA Prox  98      15      1-39%   irregular and heterogenous         +----------+--------+--------+--------+--------------------------+--------+ ICA Mid   87      18      1-39%   heterogenous                       +----------+--------+--------+--------+--------------------------+--------+ ICA Distal82      17                                                 +----------+--------+--------+--------+--------------------------+--------+ ECA       82      0               calcific                           +----------+--------+--------+--------+--------------------------+--------+ +----------+--------+-------+----------------+-------------------+           PSV cm/sEDV cmsDescribe        Arm Pressure (mmHG) +----------+--------+-------+----------------+-------------------+ [id number]     0       Multiphasic, WNL                    +----------+--------+-------+----------------+-------------------+ +---------+--------+--+--------+--+---------+ VertebralPSV cm/s65EDV cm/s17Antegrade +---------+--------+--+--------+--+---------+  Left Carotid Findings: +----------+--------+--------+--------+----------------------+--------+           PSV cm/sEDV cm/sStenosisPlaque Description    Comments +----------+--------+--------+--------+----------------------+--------+ CCA Prox  78      14                                             +----------+--------+--------+--------+----------------------+--------+ CCA Mid   165     26      >50%    calcific and irregular         +----------+--------+--------+--------+----------------------+--------+ CCA Distal259     31              heterogenous                   +----------+--------+--------+--------+----------------------+--------+ ICA Prox  83      18      1-39%   calcific                       +----------+--------+--------+--------+----------------------+--------+ ICA Mid   88      23                                             +----------+--------+--------+--------+----------------------+--------+ ICA Distal92      24                                             +----------+--------+--------+--------+----------------------+--------+  ECA       101     0               calcific                       +----------+--------+--------+--------+----------------------+--------+ +----------+--------+--------+----------------+-------------------+           PSV cm/sEDV cm/sDescribe        Arm Pressure (mmHG) +----------+--------+--------+----------------+-------------------+ [id number]      0       Multiphasic, WNL                    +----------+--------+--------+----------------+-------------------+ +---------+--------+--+--------+-+---------+ VertebralPSV cm/s40EDV cm/s7Antegrade  +---------+--------+--+--------+-+---------+   Summary: Right Carotid: Velocities in the right ICA are consistent with a 1-39% stenosis. Left Carotid: Velocities in the left ICA are consistent with a 1-39% stenosis.               Hemodynamically significant plaque >50% visualized in the CCA. Vertebrals:  Bilateral vertebral arteries demonstrate antegrade flow. Subclavians: Normal flow hemodynamics were seen in bilateral subclavian              arteries. *See table(s) above for measurements and observations.  Electronically signed by Harold Barban MD on 01/06/2020 at 1:52:48 PM.    Final    VAS [place] LOWER EXTREMITY BYPASS GRAFT DUPL  Result Date: 01/06/2020 LOWER EXTREMITY ARTERIAL DUPLEX STUDY Indications: Claudication, ulceration, and peripheral artery disease. High Risk Factors: Hypertension, hyperlipidemia, Diabetes, past history of                    smoking.  Vascular Interventions: Left fem-pop bypass graft and ileo-femoral and popliteal                         endarterectomy. Current ABI:            ABIs not obtained. Tibial arteries are known to be non                         compressible. Performing Technologist: Delorise Shiner RVT  Examination Guidelines: A complete evaluation includes B-mode imaging, spectral Doppler, color Doppler, and power Doppler as needed of all accessible portions of each vessel. Bilateral testing is considered an integral part of a complete examination. Limited examinations for reoccurring indications may be performed as noted.  +----------+--------+-----+--------+---------+--------+ LEFT      PSV cm/sRatioStenosisWaveform Comments +----------+--------+-----+--------+---------+--------+ CFA Distal74                   triphasic         +----------+--------+-----+--------+---------+--------+  Left Graft #1: +--------------------+--------+--------+---------+--------+                     PSV cm/sStenosisWaveform Comments  +--------------------+--------+--------+---------+--------+ Inflow              54              triphasic         +--------------------+--------+--------+---------+--------+ Proximal Anastomosis66              triphasic         +--------------------+--------+--------+---------+--------+ Proximal Graft      90              triphasic         +--------------------+--------+--------+---------+--------+ Mid Graft           78  biphasic          +--------------------+--------+--------+---------+--------+ Distal Graft        76              biphasic          +--------------------+--------+--------+---------+--------+ Distal Anastomosis  117             triphasic         +--------------------+--------+--------+---------+--------+ Outflow             112             biphasic          +--------------------+--------+--------+---------+--------+   Summary: Left: Patent left fem-pop bypass graft with no evidence of restenosis.  See table(s) above for measurements and observations. Electronically signed by Harold Barban MD on 01/06/2020 at 3:49:08 PM.    Final     Impression:  ClinicalStage IIb (T3a, N0, M0)non-small cell lung cancer presenting in the leftupper lung area (squamous cell)    Recent PET scan showed locally recurrent disease within the left upper lobe, as evidenced by progressive soft tissue density and hypermetabolism. It also showed left-sided mediastinal hypermetabolism, likely related to nodal metastasis.  In light of these new findings I recommended patient be evaluated by medical oncology.  The patient would like to be seen by Dr. Julien Nordmann.  Plan: Medical oncology consultation concerning recurrent non-small cell lung cancer  (squamous cell)  Total time spent in this encounter was 15 minutes which included reviewing the patient's most recent PET scan, physical examination, and documentation.  ___________________________________   Blair Promise, PhD, MD  This document serves as a record of services personally performed by [name], MD. It was created on his behalf by Clerance Lav, a trained medical scribe. The creation of this record is based on the scribe's personal observations and the provider's statements to them. This document has been checked and approved by the attending provider.

## 2020-02-03 NOTE — Progress Notes (Signed)
Patient here for a f/u visit and to review his last scan wit Dr. Sondra Come. He denies any pain. He denies any new shortness of breath and coughs once in a while.   BP (!) 160/69 (BP Location: Left Arm, Patient Position: Sitting)   Pulse 73   Temp (!) 97 F (36.1 C) (Oral)   Resp 20   Ht 6\' 2"  (1.88 m)   Wt 157 lb 7 oz (71.4 kg)   SpO2 99%   BMI 20.21 kg/m    Wt Readings from Last 3 Encounters:  02/03/20 157 lb 7 oz (71.4 kg)  01/23/20 155 lb 4 oz (70.4 kg)  01/23/20 154 lb 12.8 oz (70.2 kg)

## 2020-02-04 ENCOUNTER — Telehealth: Payer: Self-pay | Admitting: Oncology

## 2020-02-04 NOTE — Telephone Encounter (Signed)
Scheduled appt per 9/14 sch msg - pt is aware of appt date and time

## 2020-02-05 DIAGNOSIS — E11319 Type 2 diabetes mellitus with unspecified diabetic retinopathy without macular edema: Secondary | ICD-10-CM | POA: Diagnosis not present

## 2020-02-05 DIAGNOSIS — E1151 Type 2 diabetes mellitus with diabetic peripheral angiopathy without gangrene: Secondary | ICD-10-CM | POA: Diagnosis not present

## 2020-02-05 DIAGNOSIS — I2510 Atherosclerotic heart disease of native coronary artery without angina pectoris: Secondary | ICD-10-CM | POA: Diagnosis not present

## 2020-02-05 DIAGNOSIS — E7800 Pure hypercholesterolemia, unspecified: Secondary | ICD-10-CM | POA: Diagnosis not present

## 2020-02-05 DIAGNOSIS — C3492 Malignant neoplasm of unspecified part of left bronchus or lung: Secondary | ICD-10-CM | POA: Diagnosis not present

## 2020-02-05 DIAGNOSIS — I10 Essential (primary) hypertension: Secondary | ICD-10-CM | POA: Diagnosis not present

## 2020-02-05 DIAGNOSIS — N183 Chronic kidney disease, stage 3 unspecified: Secondary | ICD-10-CM | POA: Diagnosis not present

## 2020-02-05 DIAGNOSIS — E782 Mixed hyperlipidemia: Secondary | ICD-10-CM | POA: Diagnosis not present

## 2020-02-05 DIAGNOSIS — C3490 Malignant neoplasm of unspecified part of unspecified bronchus or lung: Secondary | ICD-10-CM | POA: Diagnosis not present

## 2020-02-05 DIAGNOSIS — E1122 Type 2 diabetes mellitus with diabetic chronic kidney disease: Secondary | ICD-10-CM | POA: Diagnosis not present

## 2020-02-05 DIAGNOSIS — E133599 Other specified diabetes mellitus with proliferative diabetic retinopathy without macular edema, unspecified eye: Secondary | ICD-10-CM | POA: Diagnosis not present

## 2020-02-05 DIAGNOSIS — N401 Enlarged prostate with lower urinary tract symptoms: Secondary | ICD-10-CM | POA: Diagnosis not present

## 2020-02-06 ENCOUNTER — Inpatient Hospital Stay: Payer: HMO | Attending: Oncology | Admitting: Oncology

## 2020-02-06 ENCOUNTER — Other Ambulatory Visit: Payer: Self-pay | Admitting: Radiation Oncology

## 2020-02-06 ENCOUNTER — Other Ambulatory Visit: Payer: Self-pay

## 2020-02-06 ENCOUNTER — Other Ambulatory Visit: Payer: Self-pay | Admitting: Oncology

## 2020-02-06 VITALS — BP 137/73 | HR 75 | Temp 98.1°F | Resp 18 | Ht 74.0 in | Wt 157.4 lb

## 2020-02-06 DIAGNOSIS — C771 Secondary and unspecified malignant neoplasm of intrathoracic lymph nodes: Secondary | ICD-10-CM | POA: Diagnosis not present

## 2020-02-06 DIAGNOSIS — J449 Chronic obstructive pulmonary disease, unspecified: Secondary | ICD-10-CM | POA: Diagnosis not present

## 2020-02-06 DIAGNOSIS — Z85820 Personal history of malignant melanoma of skin: Secondary | ICD-10-CM | POA: Insufficient documentation

## 2020-02-06 DIAGNOSIS — C3412 Malignant neoplasm of upper lobe, left bronchus or lung: Secondary | ICD-10-CM | POA: Diagnosis not present

## 2020-02-06 DIAGNOSIS — C3490 Malignant neoplasm of unspecified part of unspecified bronchus or lung: Secondary | ICD-10-CM

## 2020-02-06 DIAGNOSIS — Z7189 Other specified counseling: Secondary | ICD-10-CM | POA: Insufficient documentation

## 2020-02-06 DIAGNOSIS — Z923 Personal history of irradiation: Secondary | ICD-10-CM | POA: Insufficient documentation

## 2020-02-06 MED ORDER — PROCHLORPERAZINE MALEATE 10 MG PO TABS: 10.0000 mg | ORAL_TABLET | Freq: Four times a day (QID) | ORAL | 0 refills | Status: DC | PRN

## 2020-02-06 NOTE — Progress Notes (Signed)
START ON PATHWAY REGIMEN - Non-Small Cell Lung     A cycle is every 21 days:     Pembrolizumab   **Always confirm dose/schedule in your pharmacy ordering system**  Patient Characteristics: Stage IV Metastatic, Squamous, PS = 2, First Line, PD-L1 Expression Positive ? 50% (TPS) Therapeutic Status: Stage IV Metastatic Histology: Squamous Cell Line of therapy: First Line ECOG Performance Status: 2 PD-L1 Expression Status: PD-L1 Positive ? 50% (TPS)  Intent of Therapy: Non-Curative / Palliative Intent, Discussed with Patient

## 2020-02-06 NOTE — Progress Notes (Signed)
Hematology and Oncology Follow Up   Mario [name] [id number] Jul 31, 1940 79 y.o. 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  He was found to have stage IIb left upper lung tumor.  He developed recurrent disease in September 2021.  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      Prior Therapy: Status post surgical excision of a right knee skin lesion.  Pathology showed squamous cell carcinoma in December 2019.  He is status post superficial inguinal lymph node dissection completed by Dr. Dalbert Batman on September 19, 2018.  He is status post radiation therapy to the left lower lobe of the lung between July 15 and December 14, 2018.  He received 60 Gray in 5 fractions.  Current therapy: Under evaluation for additional therapy.  Interim History: Mario [name] is here for repeat evaluation.  Since the last visit, he underwent a PET CT scan on January 30, 2020 which showed locally recurrent disease in the left upper lobe as well as left-sided nodal metastasis.  Clinically, he reports no specific symptoms at this time.  He denies any chest wall pain or cough.  He denies any hemoptysis or worsening dyspnea on exertion.  He does report some mild shortness of breath which is chronic related to his COPD.  He continues to drive and performs activities of daily living.  His overall frail but able to thrive at this time.     Medications: I have reviewed the patient's current medications.  Current Outpatient Medications  Medication Sig Dispense Refill  . allopurinol (ZYLOPRIM) 100 MG tablet Take 1 tablet by mouth daily (Patient taking differently: Take 100 mg by mouth at bedtime. ) 90 tablet 0  . ARTIFICIAL TEAR OP Apply 1 drop to eye daily as needed (dry eyes).    Marland Kitchen aspirin EC 81 MG tablet Take 81 mg by  mouth daily.    Marland Kitchen atorvastatin (LIPITOR) 40 MG tablet take 1 tablet by mouth daily at 6:00pm  (Patient taking differently: Take 40 mg by mouth at bedtime. ) 90 tablet 0  . Blood Glucose Monitoring Suppl (FREESTYLE FREEDOM LITE) w/Device KIT USE TO check blood sugar TWICE DAILY    . Blood Glucose Monitoring Suppl (FREESTYLE LITE) DEVI USE TO TEST BLOOD SUGAR TWICE DAILY. DX:E11.65 1 each 1  . calcium carbonate (TUMS - DOSED IN MG ELEMENTAL CALCIUM) 500 MG chewable tablet Chew 1 tablet by mouth daily as needed for indigestion or heartburn.    . carvedilol (COREG) 3.125 MG tablet TAKE 1 TABLET BY MOUTH TWICE A DAY (Patient taking differently: Take 3.125 mg by mouth 2 (two) times daily with a meal. ) 180 tablet 3  . Cholecalciferol (VITAMIN D) 2000 units CAPS Take 2,000 Units by mouth daily.    . fenofibrate micronized (ANTARA) 43 MG capsule Take 43 mg by mouth every evening.    . ferrous sulfate 325 (65 FE) MG tablet Take 325 mg by mouth daily.    . finasteride (PROSCAR) 5 MG tablet Take 5 mg by mouth every evening.   3  . folic acid (FOLVITE) 919 MCG tablet Take 800 mcg by mouth daily.     Marland Kitchen glucose blood (FREESTYLE LITE) test strip Use as instructed to check blood sugar 2 times per day dx code E11.65 100 each 3  . insulin glargine (LANTUS SOLOSTAR)  100 UNIT/ML Solostar Pen Inject 10 Units into the skin daily. 5 pen 1  . Insulin Pen Needle 31G X 5 MM MISC Use once a day to inject Lantus 50 each 1  . INVOKANA 100 MG TABS tablet TAKE ONE TABLET BY MOUTH ONCE DAILY BEFORE BREAKFAST 30 tablet 3  . Lancets (FREESTYLE) lancets Use as instructed to check blood sugar 2 times per day dx code E11.65 100 each 3  . metFORMIN (GLUCOPHAGE) 1000 MG tablet TAKE 1 TABLET BY MOUTH TWICE DAILY FOR BREAKFAST AND DINNER FOR DIABETES. (Patient taking differently: Take 1,000 mg by mouth 2 (two) times daily with a meal. ) 60 tablet 2  . miglitol (GLYSET) 50 MG tablet Take 1 tablet (50 mg total) by mouth daily with supper. 30  tablet 3  . Multiple Vitamins-Minerals (EQ VISION FORMULA 50+) CAPS Take 1 capsule by mouth daily.    . tamsulosin (FLOMAX) 0.4 MG CAPS capsule Take 0.4 mg by mouth 2 (two) times daily.    Marland Kitchen Ubiquinol (QUNOL COQ10/UBIQUINOL/MEGA) 100 MG CAPS Take 1 capsule by mouth daily.    Marland Kitchen VICTOZA 18 MG/3ML SOPN INJECT 1.8MG INTO THE SKIN DAILYAT DINNERTIME FOR DIABETES. 9 mL 3  . vitamin B-12 (CYANOCOBALAMIN) 1000 MCG tablet Take 1,000 mcg by mouth daily.     No current facility-administered medications for this visit.     Allergies:  Allergies  Allergen Reactions  . Penicillins Anaphylaxis    Has patient had a PCN reaction causing immediate rash, facial/tongue/throat swelling, SOB or lightheadedness with hypotension: Yes Has patient had a PCN reaction causing severe rash involving mucus membranes or skin necrosis: No Has patient had a PCN reaction that required hospitalization: Yes Has patient had a PCN reaction occurring within the last 10 years: Unknown If all of the above answers are "NO", then may proceed with Cephalosporin use.   Marland Kitchen Plavix [Clopidogrel Bisulfate] Hives  . Pletal [Cilostazol] Itching    Physical exam Blood pressure 137/73, pulse 75, temperature 98.1 F (36.7 C), temperature source Tympanic, resp. rate 18, height 6' 2"  (1.88 m), weight 157 lb 6.4 oz (71.4 kg), SpO2 99 %.    General appearance: Comfortable appearing without any discomfort Head: Normocephalic without any trauma Oropharynx: Mucous membranes are moist and pink without any thrush or ulcers. Eyes: Pupils are equal and round reactive to light. Lymph nodes: No cervical, supraclavicular, inguinal or axillary lymphadenopathy.   Heart:regular rate and rhythm.  S1 and S2 without leg edema. Lung: Clear without any rhonchi or wheezes.  No dullness to percussion. Abdomin: Soft, nontender, nondistended with good bowel sounds.  No hepatosplenomegaly. Musculoskeletal: No joint deformity or effusion.  Full range of motion  noted. Neurological: No deficits noted on motor, sensory and deep tendon reflex exam. Skin: No petechial rash or dryness.  Appeared moist.  Psychiatric: Mood and affect appeared appropriate.    Lab Results: Lab Results  Component Value Date   WBC 7.1 11/19/2018   HGB 9.8 (L) 11/19/2018   HCT 32.7 (L) 11/19/2018   MCV 90.6 11/19/2018   PLT 200 11/19/2018     Chemistry      Component Value Date/Time   NA 138 01/21/2020 1043   K 3.9 01/21/2020 1043   CL 108 01/21/2020 1043   CO2 20 01/21/2020 1043   BUN 28 (H) 01/21/2020 1043   CREATININE 1.38 01/21/2020 1043   CREATININE 1.14 08/13/2013 1043      Component Value Date/Time   CALCIUM 9.1 01/21/2020 1043   ALKPHOS 73  06/10/2019 1033   AST 28 06/10/2019 1033   ALT 50 06/10/2019 1033   BILITOT 0.8 06/10/2019 1033     IMPRESSION: 1. Locally recurrent disease within the left upper lobe, as evidenced by progressive soft tissue density and hypermetabolism. 2. Left-sided mediastinal hypermetabolism, likely related to nodal metastasis. 3. No hypermetabolic extrathoracic metastatic disease. 4. Incidental findings, including: Aortic atherosclerosis (ICD10-I70.0) and emphysema (ICD10-J43.9).     Impression and Plan:      1.  Squamous cell carcinoma of the lung diagnosed in February 2020.  He was found to have stage II disease.  He status post radiation therapy at that time.  PET CT scan obtained on 01/30/2020 was personally reviewed and discussed with the patient today.  He has developed locally recurrent disease with metastatic mediastinal lymphadenopathy involvement.  Treatment options at this time were discussed which include systemic chemotherapy, systemic chemotherapy with Pembrolizumab or Pembrolizumab alone.  It would require additional tissue biopsy to document PD-L1 status with his original specimen is insufficient for PD-L1 testing.  Given his overall frailty he would be a marginal candidate for systemic  chemotherapy.  He has opted to proceed with Pembrolizumab alone.  Complication associated with this treatments include nausea, fatigue, pruritus and immune mediated complications.  We will proceed in the next 2 weeks after completing education class.  2.    Squamous cell carcinoma of the skin.  There is status post surgical resection without any evidence of recurrent disease on pet imaging.  Continues to follow with dermatology.   3.  Antiemetics: Prescription for Compazine will be made available to him.   4.  Immune mediated complications: We'll continue to monitor his thyroid function periodically.  I educated him about potential issues such as pneumonitis, colitis, hepatitis and hypophysitis.  5.  Follow-up: Will be in the near future for start of therapy.    30  minutes were dedicated to this visit. The time was spent on reviewing l imaging studies, discussing treatment options, complication related to these therapies and answering questions regarding future plan.   [name], MD 02/06/2020 11:49 AM

## 2020-02-07 ENCOUNTER — Telehealth: Payer: Self-pay | Admitting: Oncology

## 2020-02-07 NOTE — Telephone Encounter (Signed)
Scheduled per 09/16 los, patient has been called and notified.  

## 2020-02-14 ENCOUNTER — Other Ambulatory Visit: Payer: Self-pay

## 2020-02-14 ENCOUNTER — Inpatient Hospital Stay: Payer: HMO

## 2020-02-17 NOTE — Progress Notes (Signed)
Pharmacist Chemotherapy Monitoring - Initial Assessment    Anticipated start date: 02/21/20  Regimen:  . Are orders appropriate based on the patient's diagnosis, regimen, and cycle? Yes . Does the plan date match the patient's scheduled date? Yes . Is the sequencing of drugs appropriate? Yes . Are the premedications appropriate for the patient's regimen? Yes . Prior Authorization for treatment is: Approved o If applicable, is the correct biosimilar selected based on the patient's insurance? not applicable  Organ Function and Labs: Marland Kitchen Are dose adjustments needed based on the patient's renal function, hepatic function, or hematologic function? Yes . Are appropriate labs ordered prior to the start of patient's treatment? Yes . Other organ system assessment, if indicated: N/A . The following baseline labs, if indicated, have been ordered: N/A  Dose Assessment: . Are the drug doses appropriate? Yes . Are the following correct: o Drug concentrations Yes o IV fluid compatible with drug Yes o Administration routes Yes o Timing of therapy Yes . If applicable, does the patient have documented access for treatment and/or plans for port-a-cath placement? not applicable . If applicable, have lifetime cumulative doses been properly documented and assessed? not applicable Lifetime Dose Tracking  No doses have been documented on this patient for the following tracked chemicals: Doxorubicin, Epirubicin, Idarubicin, Daunorubicin, Mitoxantrone, Bleomycin, Oxaliplatin, Carboplatin, Liposomal Doxorubicin  o   Toxicity Monitoring/Prevention: . The patient has the following take home antiemetics prescribed: Prochlorperazine . The patient has the following take home medications prescribed: N/A . Medication allergies and previous infusion related reactions, if applicable, have been reviewed and addressed. Yes . The patient's current medication list has been assessed for drug-drug interactions with their  chemotherapy regimen. no significant drug-drug interactions were identified on review.  Order Review: . Are the treatment plan orders signed? Yes . Is the patient scheduled to see a provider prior to their treatment? No  I verify that I have reviewed each item in the above checklist and answered each question accordingly.  Mario [name] 02/17/2020 11:42 AM

## 2020-02-18 ENCOUNTER — Other Ambulatory Visit: Payer: Self-pay

## 2020-02-18 ENCOUNTER — Ambulatory Visit: Payer: HMO | Admitting: Orthotics

## 2020-02-18 DIAGNOSIS — L02611 Cutaneous abscess of right foot: Secondary | ICD-10-CM

## 2020-02-18 DIAGNOSIS — E1142 Type 2 diabetes mellitus with diabetic polyneuropathy: Secondary | ICD-10-CM

## 2020-02-18 DIAGNOSIS — L03031 Cellulitis of right toe: Secondary | ICD-10-CM

## 2020-02-18 DIAGNOSIS — I739 Peripheral vascular disease, unspecified: Secondary | ICD-10-CM

## 2020-02-18 DIAGNOSIS — L02619 Cutaneous abscess of unspecified foot: Secondary | ICD-10-CM

## 2020-02-18 DIAGNOSIS — L84 Corns and callosities: Secondary | ICD-10-CM

## 2020-02-18 NOTE — Progress Notes (Signed)
Due to the expense, patient didn't want to get custom foot orthotics; we are getting diabetic shoes/inserts instead as he qualifys for those.

## 2020-02-21 ENCOUNTER — Inpatient Hospital Stay: Payer: HMO | Attending: Oncology

## 2020-02-21 ENCOUNTER — Inpatient Hospital Stay: Payer: HMO

## 2020-02-21 ENCOUNTER — Other Ambulatory Visit: Payer: Self-pay

## 2020-02-21 VITALS — BP 147/69 | HR 76 | Temp 97.7°F | Resp 18

## 2020-02-21 DIAGNOSIS — C771 Secondary and unspecified malignant neoplasm of intrathoracic lymph nodes: Secondary | ICD-10-CM | POA: Diagnosis not present

## 2020-02-21 DIAGNOSIS — Z923 Personal history of irradiation: Secondary | ICD-10-CM | POA: Insufficient documentation

## 2020-02-21 DIAGNOSIS — Z23 Encounter for immunization: Secondary | ICD-10-CM | POA: Diagnosis not present

## 2020-02-21 DIAGNOSIS — C3412 Malignant neoplasm of upper lobe, left bronchus or lung: Secondary | ICD-10-CM | POA: Insufficient documentation

## 2020-02-21 DIAGNOSIS — C3490 Malignant neoplasm of unspecified part of unspecified bronchus or lung: Secondary | ICD-10-CM

## 2020-02-21 DIAGNOSIS — Z85820 Personal history of malignant melanoma of skin: Secondary | ICD-10-CM | POA: Insufficient documentation

## 2020-02-21 DIAGNOSIS — Z5112 Encounter for antineoplastic immunotherapy: Secondary | ICD-10-CM | POA: Insufficient documentation

## 2020-02-21 DIAGNOSIS — J449 Chronic obstructive pulmonary disease, unspecified: Secondary | ICD-10-CM | POA: Insufficient documentation

## 2020-02-21 DIAGNOSIS — Z79899 Other long term (current) drug therapy: Secondary | ICD-10-CM | POA: Diagnosis not present

## 2020-02-21 DIAGNOSIS — C44722 Squamous cell carcinoma of skin of right lower limb, including hip: Secondary | ICD-10-CM

## 2020-02-21 LAB — CMP (CANCER CENTER ONLY)
ALT: 32 U/L (ref 0–44)
AST: 23 U/L (ref 15–41)
Albumin: 3.4 g/dL — ABNORMAL LOW (ref 3.5–5.0)
Alkaline Phosphatase: 92 U/L (ref 38–126)
Anion gap: 7 (ref 5–15)
BUN: 31 mg/dL — ABNORMAL HIGH (ref 8–23)
CO2: 21 mmol/L — ABNORMAL LOW (ref 22–32)
Calcium: 9.6 mg/dL (ref 8.9–10.3)
Chloride: 109 mmol/L (ref 98–111)
Creatinine: 1.64 mg/dL — ABNORMAL HIGH (ref 0.61–1.24)
GFR, Est AFR Am: 46 mL/min — ABNORMAL LOW (ref >60)
GFR, Estimated: 39 mL/min — ABNORMAL LOW (ref >60)
Glucose, Bld: 143 mg/dL — ABNORMAL HIGH (ref 70–99)
Potassium: 4.1 mmol/L (ref 3.5–5.1)
Sodium: 137 mmol/L (ref 135–145)
Total Bilirubin: 0.7 mg/dL (ref 0.3–1.2)
Total Protein: 7.2 g/dL (ref 6.5–8.1)

## 2020-02-21 LAB — CBC WITH DIFFERENTIAL (CANCER CENTER ONLY)
Abs Immature Granulocytes: 0.02 10*3/uL (ref 0.00–0.07)
Basophils Absolute: 0 10*3/uL (ref 0.0–0.1)
Basophils Relative: 1 %
Eosinophils Absolute: 0.2 10*3/uL (ref 0.0–0.5)
Eosinophils Relative: 2 %
HCT: 31.8 % — ABNORMAL LOW (ref 39.0–52.0)
Hemoglobin: 9.8 g/dL — ABNORMAL LOW (ref 13.0–17.0)
Immature Granulocytes: 0 %
Lymphocytes Relative: 13 %
Lymphs Abs: 1.1 10*3/uL (ref 0.7–4.0)
MCH: 26.3 pg (ref 26.0–34.0)
MCHC: 30.8 g/dL (ref 30.0–36.0)
MCV: 85.3 fL (ref 80.0–100.0)
Monocytes Absolute: 0.6 10*3/uL (ref 0.1–1.0)
Monocytes Relative: 8 %
Neutro Abs: 6.2 10*3/uL (ref 1.7–7.7)
Neutrophils Relative %: 76 %
Platelet Count: 208 10*3/uL (ref 150–400)
RBC: 3.73 MIL/uL — ABNORMAL LOW (ref 4.22–5.81)
RDW: 15.4 % (ref 11.5–15.5)
WBC Count: 8.1 10*3/uL (ref 4.0–10.5)
nRBC: 0 % (ref 0.0–0.2)

## 2020-02-21 LAB — TSH: TSH: 3.356 u[IU]/mL (ref 0.320–4.118)

## 2020-02-21 MED ORDER — SODIUM CHLORIDE 0.9 % IV SOLN
Freq: Once | INTRAVENOUS | Status: AC
  Filled 2020-02-21: qty 250

## 2020-02-21 MED ORDER — SODIUM CHLORIDE 0.9 % IV SOLN
200.0000 mg | Freq: Once | INTRAVENOUS | Status: AC
  Administered 2020-02-21: 200 mg via INTRAVENOUS
  Filled 2020-02-21: qty 8

## 2020-02-21 NOTE — Progress Notes (Signed)
PD-L1 results not available at this time per MD. Proceed with treatment per Dr. [name].   Hardie Pulley, PharmD, BCPS, BCOP

## 2020-02-21 NOTE — Patient Instructions (Signed)
[facility] Discharge Instructions for Patients Receiving Chemotherapy  Today you received the following chemotherapy agents Keytruda  To help prevent nausea and vomiting after your treatment, we encourage you to take your nausea medication as directed   If you develop nausea and vomiting that is not controlled by your nausea medication, call the clinic.   BELOW ARE SYMPTOMS THAT SHOULD BE REPORTED IMMEDIATELY:  *FEVER GREATER THAN 100.5 F  *CHILLS WITH OR WITHOUT FEVER  NAUSEA AND VOMITING THAT IS NOT CONTROLLED WITH YOUR NAUSEA MEDICATION  *UNUSUAL SHORTNESS OF BREATH  *UNUSUAL BRUISING OR BLEEDING  TENDERNESS IN MOUTH AND THROAT WITH OR WITHOUT PRESENCE OF ULCERS  *URINARY PROBLEMS  *BOWEL PROBLEMS  UNUSUAL RASH Items with * indicate a potential emergency and should be followed up as soon as possible.  Feel free to call the clinic should you have any questions or concerns. The clinic phone number is [phone].  Please show the [facility] at check-in to the Emergency Department and triage nurse.  Pembrolizumab injection What is this medicine? PEMBROLIZUMAB (pem broe liz ue mab) is a monoclonal antibody. It is used to treat certain types of cancer. This medicine may be used for other purposes; ask your health care provider or pharmacist if you have questions. COMMON BRAND NAME(S): Keytruda What should I tell my health care provider before I take this medicine? They need to know if you have any of these conditions:  diabetes  immune system problems  inflammatory bowel disease  liver disease  lung or breathing disease  lupus  received or scheduled to receive an organ transplant or a stem-cell transplant that uses donor stem cells  an unusual or allergic reaction to pembrolizumab, other medicines, foods, dyes, or preservatives  pregnant or trying to get pregnant  breast-feeding How should I use this medicine? This medicine is for  infusion into a vein. It is given by a health care professional in a hospital or clinic setting. A special MedGuide will be given to you before each treatment. Be sure to read this information carefully each time. Talk to your pediatrician regarding the use of this medicine in children. While this drug may be prescribed for children as young as 6 months for selected conditions, precautions do apply. Overdosage: If you think you have taken too much of this medicine contact a poison control center or emergency room at once. NOTE: This medicine is only for you. Do not share this medicine with others. What if I miss a dose? It is important not to miss your dose. Call your doctor or health care professional if you are unable to keep an appointment. What may interact with this medicine? Interactions have not been studied. Give your health care provider a list of all the medicines, herbs, non-prescription drugs, or dietary supplements you use. Also tell them if you smoke, drink alcohol, or use illegal drugs. Some items may interact with your medicine. This list may not describe all possible interactions. Give your health care provider a list of all the medicines, herbs, non-prescription drugs, or dietary supplements you use. Also tell them if you smoke, drink alcohol, or use illegal drugs. Some items may interact with your medicine. What should I watch for while using this medicine? Your condition will be monitored carefully while you are receiving this medicine. You may need blood work done while you are taking this medicine. Do not become pregnant while taking this medicine or for 4 months after stopping it. Women should  inform their doctor if they wish to become pregnant or think they might be pregnant. There is a potential for serious side effects to an unborn child. Talk to your health care professional or pharmacist for more information. Do not breast-feed an infant while taking this medicine or for 4  months after the last dose. What side effects may I notice from receiving this medicine? Side effects that you should report to your doctor or health care professional as soon as possible:  allergic reactions like skin rash, itching or hives, swelling of the face, lips, or tongue  bloody or black, tarry  breathing problems  changes in vision  chest pain  chills  confusion  constipation  cough  diarrhea  dizziness or feeling faint or lightheaded  fast or irregular heartbeat  fever  flushing  joint pain  low blood counts - this medicine may decrease the number of white blood cells, red blood cells and platelets. You may be at increased risk for infections and bleeding.  muscle pain  muscle weakness  pain, tingling, numbness in the hands or feet  persistent headache  redness, blistering, peeling or loosening of the skin, including inside the mouth  signs and symptoms of high blood sugar such as dizziness; dry mouth; dry skin; fruity breath; nausea; stomach pain; increased hunger or thirst; increased urination  signs and symptoms of kidney injury like trouble passing urine or change in the amount of urine  signs and symptoms of liver injury like dark urine, light-colored stools, loss of appetite, nausea, right upper belly pain, yellowing of the eyes or skin  sweating  swollen lymph nodes  weight loss Side effects that usually do not require medical attention (report to your doctor or health care professional if they continue or are bothersome):  decreased appetite  hair loss  muscle pain  tiredness This list may not describe all possible side effects. Call your doctor for medical advice about side effects. You may report side effects to FDA at 1-800-FDA-1088. Where should I keep my medicine? This drug is given in a hospital or clinic and will not be stored at home. NOTE: This sheet is a summary. It may not cover all possible information. If you have  questions about this medicine, talk to your doctor, pharmacist, or health care provider.  2020 Elsevier/Gold Standard (2019-03-15 18:07:58)

## 2020-02-21 NOTE — Progress Notes (Signed)
Per Dr. [name], Pt ok to treat with Creatinine of 1.64

## 2020-02-24 ENCOUNTER — Telehealth: Payer: Self-pay | Admitting: *Deleted

## 2020-02-24 NOTE — Telephone Encounter (Signed)
Patient called to ask if ok for him to receive 3rd covid vax and flu shot - stating he was advised by [facility] education RN Janifer Adie that he should consider having them. Per Dr. Hazeline Junker directions, advised patient he may get both. Schedule message sent to contact patient to schedule for these injections.  Patient in agreement and he expects a call from scheduling

## 2020-02-25 ENCOUNTER — Telehealth: Payer: Self-pay | Admitting: Oncology

## 2020-02-25 NOTE — Telephone Encounter (Signed)
Scheduled appt per 10/4 sch msg - pt aware of appt date and time

## 2020-02-27 ENCOUNTER — Other Ambulatory Visit: Payer: Self-pay

## 2020-02-27 ENCOUNTER — Inpatient Hospital Stay: Payer: HMO

## 2020-02-27 DIAGNOSIS — C3412 Malignant neoplasm of upper lobe, left bronchus or lung: Secondary | ICD-10-CM | POA: Diagnosis present

## 2020-02-27 DIAGNOSIS — Z5112 Encounter for antineoplastic immunotherapy: Secondary | ICD-10-CM | POA: Diagnosis present

## 2020-02-27 DIAGNOSIS — Z23 Encounter for immunization: Secondary | ICD-10-CM

## 2020-02-27 MED ORDER — INFLUENZA VAC A&B SA ADJ QUAD 0.5 ML IM PRSY
0.5000 mL | PREFILLED_SYRINGE | Freq: Once | INTRAMUSCULAR | Status: AC
  Administered 2020-02-27: 0.5 mL via INTRAMUSCULAR

## 2020-02-27 MED ORDER — INFLUENZA VAC SPLIT QUAD 0.5 ML IM SUSY
PREFILLED_SYRINGE | INTRAMUSCULAR | Status: AC
  Filled 2020-02-27: qty 0.5

## 2020-02-27 NOTE — Progress Notes (Signed)
Patient refused 15 minute post observation for COVID booster.  Patient ambulated from facility in stable condition.

## 2020-02-27 NOTE — Patient Instructions (Signed)

## 2020-03-12 ENCOUNTER — Other Ambulatory Visit: Payer: Self-pay

## 2020-03-12 ENCOUNTER — Inpatient Hospital Stay (HOSPITAL_BASED_OUTPATIENT_CLINIC_OR_DEPARTMENT_OTHER): Payer: HMO | Admitting: Oncology

## 2020-03-12 ENCOUNTER — Inpatient Hospital Stay: Payer: HMO

## 2020-03-12 VITALS — BP 148/98 | HR 87 | Temp 97.7°F | Resp 20 | Wt 158.3 lb

## 2020-03-12 DIAGNOSIS — C44722 Squamous cell carcinoma of skin of right lower limb, including hip: Secondary | ICD-10-CM

## 2020-03-12 DIAGNOSIS — Z5112 Encounter for antineoplastic immunotherapy: Secondary | ICD-10-CM | POA: Diagnosis not present

## 2020-03-12 DIAGNOSIS — C3490 Malignant neoplasm of unspecified part of unspecified bronchus or lung: Secondary | ICD-10-CM

## 2020-03-12 LAB — CBC WITH DIFFERENTIAL (CANCER CENTER ONLY)
Abs Immature Granulocytes: 0.05 10*3/uL (ref 0.00–0.07)
Basophils Absolute: 0.1 10*3/uL (ref 0.0–0.1)
Basophils Relative: 1 %
Eosinophils Absolute: 0.2 10*3/uL (ref 0.0–0.5)
Eosinophils Relative: 3 %
HCT: 30 % — ABNORMAL LOW (ref 39.0–52.0)
Hemoglobin: 9.3 g/dL — ABNORMAL LOW (ref 13.0–17.0)
Immature Granulocytes: 1 %
Lymphocytes Relative: 13 %
Lymphs Abs: 1 10*3/uL (ref 0.7–4.0)
MCH: 25.5 pg — ABNORMAL LOW (ref 26.0–34.0)
MCHC: 31 g/dL (ref 30.0–36.0)
MCV: 82.2 fL (ref 80.0–100.0)
Monocytes Absolute: 0.6 10*3/uL (ref 0.1–1.0)
Monocytes Relative: 7 %
Neutro Abs: 6.3 10*3/uL (ref 1.7–7.7)
Neutrophils Relative %: 75 %
Platelet Count: 203 10*3/uL (ref 150–400)
RBC: 3.65 MIL/uL — ABNORMAL LOW (ref 4.22–5.81)
RDW: 15.4 % (ref 11.5–15.5)
WBC Count: 8.2 10*3/uL (ref 4.0–10.5)
nRBC: 0 % (ref 0.0–0.2)

## 2020-03-12 LAB — CMP (CANCER CENTER ONLY)
ALT: 21 U/L (ref 0–44)
AST: 14 U/L — ABNORMAL LOW (ref 15–41)
Albumin: 3 g/dL — ABNORMAL LOW (ref 3.5–5.0)
Alkaline Phosphatase: 78 U/L (ref 38–126)
Anion gap: 9 (ref 5–15)
BUN: 36 mg/dL — ABNORMAL HIGH (ref 8–23)
CO2: 17 mmol/L — ABNORMAL LOW (ref 22–32)
Calcium: 9.3 mg/dL (ref 8.9–10.3)
Chloride: 109 mmol/L (ref 98–111)
Creatinine: 1.5 mg/dL — ABNORMAL HIGH (ref 0.61–1.24)
GFR, Estimated: 47 mL/min — ABNORMAL LOW (ref >60)
Glucose, Bld: 272 mg/dL — ABNORMAL HIGH (ref 70–99)
Potassium: 4.1 mmol/L (ref 3.5–5.1)
Sodium: 135 mmol/L (ref 135–145)
Total Bilirubin: 0.5 mg/dL (ref 0.3–1.2)
Total Protein: 6.4 g/dL — ABNORMAL LOW (ref 6.5–8.1)

## 2020-03-12 LAB — TSH: TSH: 2.987 u[IU]/mL (ref 0.320–4.118)

## 2020-03-12 MED ORDER — LIDOCAINE-PRILOCAINE 2.5-2.5 % EX CREA: 1.0000 "application " | TOPICAL_CREAM | CUTANEOUS | 0 refills | Status: AC | PRN

## 2020-03-12 MED ORDER — PROCHLORPERAZINE MALEATE 10 MG PO TABS: 10.0000 mg | ORAL_TABLET | Freq: Four times a day (QID) | ORAL | 0 refills | Status: AC | PRN

## 2020-03-12 MED ORDER — SODIUM CHLORIDE 0.9 % IV SOLN
200.0000 mg | Freq: Once | INTRAVENOUS | Status: AC
  Administered 2020-03-12: 200 mg via INTRAVENOUS
  Filled 2020-03-12: qty 8

## 2020-03-12 MED ORDER — SODIUM CHLORIDE 0.9 % IV SOLN
Freq: Once | INTRAVENOUS | Status: AC
  Filled 2020-03-12: qty 250

## 2020-03-12 NOTE — Progress Notes (Signed)
Hematology and Oncology Follow Up   Mario [name] [id number] 08/12/1940 79 y.o. 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      Prior Therapy: Status post surgical excision of a right knee skin lesion.  Pathology showed squamous cell carcinoma in December 2019.  He is status post superficial inguinal lymph node dissection completed by Dr. Dalbert Batman on September 19, 2018.  He is status post radiation therapy to the left lower lobe of the lung between July 15 and December 14, 2018.  He received 60 Gray in 5 fractions.  He developed relapsed disease in September 2021 with local recurrence in the left upper lobe as well as left sided mediastinal adenopathy.   Current therapy: Pembrolizumab 200 mg every 3 weeks started on February 21, 2020.  He is here for cycle 2 of therapy.  Interim History: [name] returns today for a follow-up visit.  He completed the first cycle of Pembrolizumab on October 1 without any major complications.  He denies any nausea vomiting or abdominal pain.  He denies any excessive fatigue or tiredness.  He denies any recent hospitalizations or illnesses.  His performance status and quality of life remain excellent.    Medications: Updated on review.  Current Outpatient Medications  Medication Sig Dispense Refill  . allopurinol (ZYLOPRIM) 100 MG tablet Take 1 tablet by mouth daily (Patient taking differently: Take 100 mg by mouth at bedtime. ) 90 tablet 0  . ARTIFICIAL TEAR OP Apply 1 drop to eye daily as needed (dry eyes).    Marland Kitchen aspirin EC 81 MG tablet Take 81 mg by mouth daily.    Marland Kitchen atorvastatin (LIPITOR) 40 MG tablet take 1 tablet by mouth daily at 6:00pm   (Patient taking differently: Take 40 mg by mouth at bedtime. ) 90 tablet 0  . Blood Glucose Monitoring Suppl (FREESTYLE FREEDOM LITE) w/Device KIT USE TO check blood sugar TWICE DAILY    . Blood Glucose Monitoring Suppl (FREESTYLE LITE) DEVI USE TO TEST BLOOD SUGAR TWICE DAILY. DX:E11.65 1 each 1  . calcium carbonate (TUMS - DOSED IN MG ELEMENTAL CALCIUM) 500 MG chewable tablet Chew 1 tablet by mouth daily as needed for indigestion or heartburn.    . carvedilol (COREG) 3.125 MG tablet TAKE 1 TABLET BY MOUTH TWICE A DAY (Patient taking differently: Take 3.125 mg by mouth 2 (two) times daily with a meal. ) 180 tablet 3  . Cholecalciferol (VITAMIN D) 2000 units CAPS Take 2,000 Units by mouth daily.    . fenofibrate micronized (ANTARA) 43 MG capsule Take 43 mg by mouth every evening.    . ferrous sulfate 325 (65 FE) MG tablet Take 325 mg by mouth daily.    . finasteride (PROSCAR) 5 MG tablet Take 5 mg by mouth every evening.   3  . folic acid (FOLVITE) 638 MCG tablet Take 800 mcg by mouth daily.     Marland Kitchen glucose blood (FREESTYLE LITE) test strip Use as instructed to check blood sugar 2 times per day dx code E11.65 100 each 3  . insulin glargine (LANTUS SOLOSTAR) 100 UNIT/ML Solostar Pen Inject 10 Units into the skin daily. 5 pen 1  . Insulin Pen Needle 31G X 5  MM MISC Use once a day to inject Lantus 50 each 1  . INVOKANA 100 MG TABS tablet TAKE ONE TABLET BY MOUTH ONCE DAILY BEFORE BREAKFAST 30 tablet 3  . Lancets (FREESTYLE) lancets Use as instructed to check blood sugar 2 times per day dx code E11.65 100 each 3  . metFORMIN (GLUCOPHAGE) 1000 MG tablet TAKE 1 TABLET BY MOUTH TWICE DAILY FOR BREAKFAST AND DINNER FOR DIABETES. (Patient taking differently: Take 1,000 mg by mouth 2 (two) times daily with a meal. ) 60 tablet 2  . miglitol (GLYSET) 50 MG tablet Take 1 tablet (50 mg total) by mouth daily with supper. 30 tablet 3  . Multiple Vitamins-Minerals (EQ VISION FORMULA 50+) CAPS Take 1 capsule by mouth  daily.    . prochlorperazine (COMPAZINE) 10 MG tablet Take 1 tablet (10 mg total) by mouth every 6 (six) hours as needed for nausea or vomiting. 30 tablet 0  . tamsulosin (FLOMAX) 0.4 MG CAPS capsule Take 0.4 mg by mouth 2 (two) times daily.    Marland Kitchen Ubiquinol (QUNOL COQ10/UBIQUINOL/MEGA) 100 MG CAPS Take 1 capsule by mouth daily.    Marland Kitchen VICTOZA 18 MG/3ML SOPN INJECT 1.8MG INTO THE SKIN DAILYAT DINNERTIME FOR DIABETES. 9 mL 3  . vitamin B-12 (CYANOCOBALAMIN) 1000 MCG tablet Take 1,000 mcg by mouth daily.     No current facility-administered medications for this visit.     Allergies:  Allergies  Allergen Reactions  . Penicillins Anaphylaxis    Has patient had a PCN reaction causing immediate rash, facial/tongue/throat swelling, SOB or lightheadedness with hypotension: Yes Has patient had a PCN reaction causing severe rash involving mucus membranes or skin necrosis: No Has patient had a PCN reaction that required hospitalization: Yes Has patient had a PCN reaction occurring within the last 10 years: Unknown If all of the above answers are "NO", then may proceed with Cephalosporin use.   Marland Kitchen Plavix [Clopidogrel Bisulfate] Hives  . Pletal [Cilostazol] Itching    Physical exam Blood pressure (!) 148/98, pulse 87, temperature 97.7 F (36.5 C), temperature source Tympanic, resp. rate 20, weight 158 lb 4.8 oz (71.8 kg), SpO2 99 %.  ECOG 1    General appearance: Alert, awake without any distress. Head: Atraumatic without abnormalities Oropharynx: Without any thrush or ulcers. Eyes: No scleral icterus. Lymph nodes: No lymphadenopathy noted in the cervical, supraclavicular, or axillary nodes Heart:regular rate and rhythm, without any murmurs or gallops.   Lung: Clear to auscultation without any rhonchi, wheezes or dullness to percussion. Abdomin: Soft, nontender without any shifting dullness or ascites. Musculoskeletal: No clubbing or cyanosis. Neurological: No motor or sensory deficits. Skin:  No rashes or lesions.    Lab Results: Lab Results  Component Value Date   WBC 8.1 02/21/2020   HGB 9.8 (L) 02/21/2020   HCT 31.8 (L) 02/21/2020   MCV 85.3 02/21/2020   PLT 208 02/21/2020     Chemistry      Component Value Date/Time   NA 137 02/21/2020 1039   K 4.1 02/21/2020 1039   CL 109 02/21/2020 1039   CO2 21 (L) 02/21/2020 1039   BUN 31 (H) 02/21/2020 1039   CREATININE 1.64 (H) 02/21/2020 1039   CREATININE 1.14 08/13/2013 1043      Component Value Date/Time   CALCIUM 9.6 02/21/2020 1039   ALKPHOS 92 02/21/2020 1039   AST 23 02/21/2020 1039   ALT 32 02/21/2020 1039   BILITOT 0.7 02/21/2020 1039          Impression  and Plan:      1.  Stage II squamous cell carcinoma of the lung with relapsed disease including mediastinal adenopathy noted in September 2021.  He was initially diagnosed with localized disease in February 2020.  He is currently on Pembrolizumab for palliative purposes and has tolerated the first cycle well.  Risks and benefits of proceeding with cycle 2 of therapy were reviewed.  Potential complications include nausea, fatigue, pruritus and immune mediated complications.  He is agreeable to proceed at this time.  2.    Squamous cell carcinoma of the skin.  No evidence of recurrence at this time.   3.  Antiemetics: No nausea or vomiting reported.  Compazine is available to him   4.  Immune mediated complications: Potential complications such as thyroid disease, pneumonitis, hepatitis among others will continue to be monitored.  5.  IV access: Peripheral veins are currently in use.  Risks and benefits of a Port-A-Cath insertion was discussed today.  Complications that include bleeding, thrombosis and infection were reiterated.  After discussion today he is agreeable to have that done prior to the next cycle.  6.  Follow-up: In 3 weeks for the next cycle of therapy.    30  minutes were spent on this encounter.  The time was dedicated to  reviewing his disease status, discussing treatment options and future plan of care review.   [name], MD 03/12/2020 8:25 AM

## 2020-03-12 NOTE — Patient Instructions (Addendum)
[facility] Discharge Instructions for Patients Receiving Chemotherapy  Today you received the following chemotherapy agents: pembrolizumab Beryle Flock).   To help prevent nausea and vomiting after your treatment, we encourage you to take your nausea medication as directed.   If you develop nausea and vomiting that is not controlled by your nausea medication, call the clinic.   BELOW ARE SYMPTOMS THAT SHOULD BE REPORTED IMMEDIATELY:  *FEVER GREATER THAN 100.5 F  *CHILLS WITH OR WITHOUT FEVER  NAUSEA AND VOMITING THAT IS NOT CONTROLLED WITH YOUR NAUSEA MEDICATION  *UNUSUAL SHORTNESS OF BREATH  *UNUSUAL BRUISING OR BLEEDING  TENDERNESS IN MOUTH AND THROAT WITH OR WITHOUT PRESENCE OF ULCERS  *URINARY PROBLEMS  *BOWEL PROBLEMS  UNUSUAL RASH Items with * indicate a potential emergency and should be followed up as soon as possible.  Feel free to call the clinic should you have any questions or concerns. The clinic phone number is [phone].  Please show the [facility] at check-in to the Emergency Department and triage nurse.

## 2020-03-14 DIAGNOSIS — I209 Angina pectoris, unspecified: Secondary | ICD-10-CM

## 2020-03-14 HISTORY — DX: Angina pectoris, unspecified: I20.9

## 2020-03-16 ENCOUNTER — Other Ambulatory Visit: Payer: Self-pay

## 2020-03-16 NOTE — Patient Outreach (Signed)
[facility]) Care Management Chronic Special Needs Program  03/16/2020  Name: MALIQ PILLEY DOB: 07/22/1940  MRN: [id number]  Mr. Hamilton Marinello is enrolled in a chronic special needs plan for Diabetes.  Telephone call to client for CSNP assessment update. HIPAA verified. Client states he follows up with his doctors regularly. Client states he was recently diagnosed with cancer on his lungs and is currently undergoing chemotherapy treatment. Client denies any post chemotherapy symptoms and states he has nausea medication on hand in the event he needs it. Client reports having a follow up visit with his endocrinologist next week and states he is scheduled to have a porta cath placement. Client reports he did not check his blood sugar today. He report his most recent A1c was 7.5. Client reports his blood sugars range in the 90's.  Denies any recent hypoglycemic episodes.  Client reports he continues to receive services  with [facility].    Goals Addressed              This Visit's Progress   .  "work on bringing A1c down to 7.0" (pt-stated)        Discussed diabetes self management actions:  Glucose monitoring per provider recommendation  Visit provider every 3-6 months as directed  Hbg A1C level every 3-6 months.  Continue carbohydrate controlled meal planning  Continue taking diabetes medication as prescribed by provider  Physical activity (resume pickle ball 3 times per week as stated by client) Per chart review, clients most recent dietary consult was 08/06/19    .  Client reports reduction in fatigue as evidenced by reports of increased energy and ability to perform desired activities in 6 months        Practice energy conserving strategies:  Limiting naps to 20-30 minutes Follow up with your doctor as recommended for lab work to monitor for symptoms of anemia Consult with your dietician to discuss nutrition needs during cancer treatment. Per client report:  continue to follow doctor recommendation for leg fatigue/ discomfort with morning leg elevation    .  COMPLETED: Client understands the importance of follow-up with providers by attending scheduled visits   On track     Most recent primary care provider visit 05/30/19 and 11/19/19 Most recent endocrinology visit 09/12/19, 01/23/20. Client reports next follow up with endocrinologist is 03/26/20     .  COMPLETED: Client will report following up with his doctor within 6 month regarding leg pain   On track     Client reports having follow up with vascular doctor regularly regarding his leg pain and reports elevating legs in the morning as recommended by the doctor.     .  COMPLETED: Client will report no worsening of symptoms related to heart disease within the next 6 months   On track     Client continues to deny any heart symptoms. Reports regular follow with his providers.  Client reports understanding heart disease/ heart attack symptoms.     .  COMPLETED: Client will verbalize knowledge of chronic lung disease as evidenced by no ED visits or Inpatient stays related to chronic lung disease    On track     Client denies any ED/ inpatient visits and symptoms related to lung disease.       .  Client will verbalize knowledge of self management of Hypertension as evidences by BP reading of 140/90 or less; or as defined by provider   On track     Client reports  he will occasionally check his blood pressures at home.  Reports his blood pressures are "very good."  Per chart review client recent doctor office blood pressure readings:  148/98, 137/73, and 122/58. Continue to take your blood pressure medication as prescribed.  Continue to monitor your blood pressure at least 2 times per week and record.  Contact your doctor if you have concerns regarding your blood pressure readings.     .  Client will verbalize self management of hypoglycemia in 6 months        RULE OF 15 How to treat low blood sugars  (Blood sugar less than 70 mg/dl  Please follow the RULE OF 15 for the treatment of hypoglycemia treatment (When your blood sugars are less than 70 mg/ dl) STEP  1:  Take 15 grams of carbohydrates when your blood sugar is low, which includes:   3-4 glucose tabs or  3-4 oz of juice or regular soda or  One tube of glucose gel STEP 2:  Recheck blood sugar in 15 minutes STEP 3:  If your blood sugar is still low at the 15 minute recheck ---then, go back to STEP 1 and treat again with another 15 grams of carbohydrates    .  HEMOGLOBIN A1C < 7        Re-Discussed blood sugar and Hgb A1c targets Reviewed self management actions reviewed:  Glucose monitoring per provider recommendations- encouraged him to check post meal per Dr Ronnie Derby suggestion. Take glucometer to follow up doctor appointments as recommended by your doctor.   Eat Healthy  Visit provider every 3-6 months as directed  Hbg A1C level every 3-6 months.      .  Maintain timely refills of diabetic medication as prescribed within the year .   On track     Client reports maintaining timely refills of his medications.  Contact your RN case manager if you are unable to obtain your medications. Contact your doctor if you have questions regarding your medications.     .  COMPLETED: Obtain annual  Lipid Profile, LDL-C   On track     Lipid profile completed 10/14/19     .  COMPLETED: Obtain Annual Eye (retinal)  Exam    On track     Most recent diabetes eye exam completed on 11/10/19 with no evidence of diabetic retinopathy     .  COMPLETED: Obtain Annual Foot Exam   On track     Annual foot exam 01/24/20     .  Obtain annual screen for micro albuminuria (urine) , nephropathy (kidney problems)   On track     Reviewed results (within normal limits) of urine for protein done 03/04/19 It is important for your doctor to check your urine at least once a year.  These tests show how your kidneys are working.       .  COMPLETED: Obtain  Hemoglobin A1C at least 2 times per year   On track     Hgb A1c completed 05/31/19 and 09/09/19, 10/14/19     .  Visit Primary Care Provider or Endocrinologist at least 2 times per year    On track     Most recent primary care provider visit 05/30/19 and 11/19/18 Most recent endocrinology visit 09/12/19 and 01/23/20 Continue to maintain and keep follow up visits with your providers Client reports continued follow up with [facility].  RN case manager will continue care coordination wth [facility] as needed.  Plan:  Send successful outreach letter with a copy of their individualized care plan and Send individual care plan to provider  Chronic care management coordinator will outreach in:  6 Months  RNCM will continue care coordination with [facility] as needed.    Quinn Plowman RN,BSN,CCM Chronic Care Management Coordinator [facility] [phone]    .

## 2020-03-18 ENCOUNTER — Other Ambulatory Visit: Payer: Self-pay | Admitting: Student

## 2020-03-19 ENCOUNTER — Other Ambulatory Visit: Payer: Self-pay | Admitting: Oncology

## 2020-03-19 ENCOUNTER — Ambulatory Visit (HOSPITAL_COMMUNITY)
Admission: RE | Admit: 2020-03-19 | Discharge: 2020-03-19 | Disposition: A | Discharge status: Home / Self Care | Payer: HMO | Source: Ambulatory Visit | Attending: Oncology | Admitting: Oncology

## 2020-03-19 ENCOUNTER — Encounter (HOSPITAL_COMMUNITY): Payer: Self-pay

## 2020-03-19 ENCOUNTER — Other Ambulatory Visit: Payer: Self-pay

## 2020-03-19 DIAGNOSIS — C3490 Malignant neoplasm of unspecified part of unspecified bronchus or lung: Secondary | ICD-10-CM | POA: Diagnosis not present

## 2020-03-19 DIAGNOSIS — Z87891 Personal history of nicotine dependence: Secondary | ICD-10-CM | POA: Diagnosis not present

## 2020-03-19 DIAGNOSIS — Z880 Allergy status to penicillin: Secondary | ICD-10-CM | POA: Diagnosis not present

## 2020-03-19 DIAGNOSIS — Z807 Family history of other malignant neoplasms of lymphoid, hematopoietic and related tissues: Secondary | ICD-10-CM | POA: Diagnosis not present

## 2020-03-19 DIAGNOSIS — C44722 Squamous cell carcinoma of skin of right lower limb, including hip: Secondary | ICD-10-CM

## 2020-03-19 DIAGNOSIS — Z800 Family history of malignant neoplasm of digestive organs: Secondary | ICD-10-CM | POA: Diagnosis not present

## 2020-03-19 DIAGNOSIS — Z888 Allergy status to other drugs, medicaments and biological substances status: Secondary | ICD-10-CM | POA: Insufficient documentation

## 2020-03-19 DIAGNOSIS — Z452 Encounter for adjustment and management of vascular access device: Secondary | ICD-10-CM | POA: Diagnosis not present

## 2020-03-19 HISTORY — DX: Dyspnea, unspecified: R06.00

## 2020-03-19 HISTORY — PX: IR IMAGING GUIDED PORT INSERTION: IMG5740

## 2020-03-19 LAB — CBC
HCT: 29.8 % — ABNORMAL LOW (ref 39.0–52.0)
Hemoglobin: 9.2 g/dL — ABNORMAL LOW (ref 13.0–17.0)
MCH: 25.9 pg — ABNORMAL LOW (ref 26.0–34.0)
MCHC: 30.9 g/dL (ref 30.0–36.0)
MCV: 83.9 fL (ref 80.0–100.0)
Platelets: 223 10*3/uL (ref 150–400)
RBC: 3.55 MIL/uL — ABNORMAL LOW (ref 4.22–5.81)
RDW: 15.5 % (ref 11.5–15.5)
WBC: 6.2 10*3/uL (ref 4.0–10.5)
nRBC: 0 % (ref 0.0–0.2)

## 2020-03-19 LAB — PROTIME-INR
INR: 1 (ref 0.8–1.2)
Prothrombin Time: 13 seconds (ref 11.4–15.2)

## 2020-03-19 LAB — GLUCOSE, CAPILLARY: Glucose-Capillary: 148 mg/dL — ABNORMAL HIGH (ref 70–99)

## 2020-03-19 MED ORDER — LIDOCAINE-EPINEPHRINE 1 %-1:100000 IJ SOLN
INTRAMUSCULAR | Status: AC | PRN
  Administered 2020-03-19 (×2): 10 mL via INTRADERMAL

## 2020-03-19 MED ORDER — HEPARIN SOD (PORK) LOCK FLUSH 100 UNIT/ML IV SOLN
INTRAVENOUS | Status: AC | PRN
  Administered 2020-03-19: 500 [IU] via INTRAVENOUS

## 2020-03-19 MED ORDER — MIDAZOLAM HCL 2 MG/2ML IJ SOLN
INTRAMUSCULAR | Status: AC | PRN
  Administered 2020-03-19 (×2): 1 mg via INTRAVENOUS

## 2020-03-19 MED ORDER — LIDOCAINE-EPINEPHRINE 1 %-1:100000 IJ SOLN
INTRAMUSCULAR | Status: AC
  Filled 2020-03-19: qty 1

## 2020-03-19 MED ORDER — CLINDAMYCIN PHOSPHATE 900 MG/50ML IV SOLN
INTRAVENOUS | Status: AC
  Administered 2020-03-19: 900 mg via INTRAVENOUS
  Filled 2020-03-19: qty 50

## 2020-03-19 MED ORDER — CLINDAMYCIN PHOSPHATE 900 MG/50ML IV SOLN: 900.0000 mg | INTRAVENOUS | Status: AC

## 2020-03-19 MED ORDER — MIDAZOLAM HCL 2 MG/2ML IJ SOLN
INTRAMUSCULAR | Status: AC
  Filled 2020-03-19: qty 2

## 2020-03-19 MED ORDER — SODIUM CHLORIDE 0.9 % IV SOLN: INTRAVENOUS | Status: DC

## 2020-03-19 MED ORDER — VANCOMYCIN HCL IN DEXTROSE 1-5 GM/200ML-% IV SOLN: 1000.0000 mg | INTRAVENOUS | Status: DC

## 2020-03-19 MED ORDER — HEPARIN SOD (PORK) LOCK FLUSH 100 UNIT/ML IV SOLN
INTRAVENOUS | Status: AC
  Filled 2020-03-19: qty 5

## 2020-03-19 MED ORDER — FENTANYL CITRATE (PF) 100 MCG/2ML IJ SOLN
INTRAMUSCULAR | Status: AC | PRN
Start: 2020-03-19 — End: 2020-03-19
  Administered 2020-03-19 (×2): 50 ug via INTRAVENOUS

## 2020-03-19 MED ORDER — FENTANYL CITRATE (PF) 100 MCG/2ML IJ SOLN
INTRAMUSCULAR | Status: AC
  Filled 2020-03-19: qty 2

## 2020-03-19 NOTE — Discharge Instructions (Signed)
Urgent needs - IR on call MD [phone]  Wound - May remove dressing and shower in 24 to 48 hours.  Keep site clean and dry.  Replace with bandaid. Do not submerge in tub or water until site healing well.  If ordered by your provider, may start Emla cream in 2 weeks or after incision is healed.  After completion of treatment, your provider should have you set up for monthly port flushes.    Implanted Port Insertion, Care After This sheet gives you information about how to care for yourself after your procedure. Your health care provider may also give you more specific instructions. If you have problems or questions, contact your health care provider. What can I expect after the procedure? After the procedure, it is common to have:  Discomfort at the port insertion site.  Bruising on the skin over the port. This should improve over 3-4 days. Follow these instructions at home: Port care  After your port is placed, you will get a manufacturer's information card. The card has information about your port. Keep this card with you at all times.  Take care of the port as told by your health care provider. Ask your health care provider if you or a family member can get training for taking care of the port at home. A home health care nurse may also take care of the port.  Make sure to remember what type of port you have. Incision care  Follow instructions from your health care provider about how to take care of your port insertion site. Make sure you: ? Wash your hands with soap and water before and after you change your bandage (dressing). If soap and water are not available, use hand sanitizer. ? Change your dressing as told by your health care provider. ? Leave stitches (sutures), skin glue, or adhesive strips in place. These skin closures may need to stay in place for 2 weeks or longer. If adhesive strip edges start to loosen and curl up, you may trim the loose edges. Do not remove adhesive  strips completely unless your health care provider tells you to do that.  Check your port insertion site every day for signs of infection. Check for: ? Redness, swelling, or pain. ? Fluid or blood. ? Warmth. ? Pus or a bad smell. Activity  Return to your normal activities as told by your health care provider. Ask your health care provider what activities are safe for you.  Do not lift anything that is heavier than 10 lb (4.5 kg), or the limit that you are told, until your health care provider says that it is safe. General instructions  Take over-the-counter and prescription medicines only as told by your health care provider.  Do not take baths, swim, or use a hot tub until your health care provider approves. Ask your health care provider if you may take showers. You may only be allowed to take sponge baths.  Do not drive for 24 hours if you were given a sedative during your procedure.  Wear a medical alert bracelet in case of an emergency. This will tell any health care providers that you have a port.  Keep all follow-up visits as told by your health care provider. This is important. Contact a health care provider if:  You cannot flush your port with saline as directed, or you cannot draw blood from the port.  You have a fever or chills.  You have redness, swelling, or pain around your port   insertion site.  You have fluid or blood coming from your port insertion site.  Your port insertion site feels warm to the touch.  You have pus or a bad smell coming from the port insertion site. Get help right away if:  You have chest pain or shortness of breath.  You have bleeding from your port that you cannot control. Summary  Take care of the port as told by your health care provider. Keep the manufacturer's information card with you at all times.  Change your dressing as told by your health care provider.  Contact a health care provider if you have a fever or chills or if you  have redness, swelling, or pain around your port insertion site.  Keep all follow-up visits as told by your health care provider. This information is not intended to replace advice given to you by your health care provider. Make sure you discuss any questions you have with your health care provider. Document Revised: 12/05/2017 Document Reviewed: 12/05/2017 Elsevier Patient Education  2020 [employer]   Moderate Conscious Sedation, Adult, Care After These instructions provide you with information about caring for yourself after your procedure. Your health care provider may also give you more specific instructions. Your treatment has been planned according to current medical practices, but problems sometimes occur. Call your health care provider if you have any problems or questions after your procedure. What can I expect after the procedure? After your procedure, it is common:  To feel sleepy for several hours.  To feel clumsy and have poor balance for several hours.  To have poor judgment for several hours.  To vomit if you eat too soon. Follow these instructions at home: For at least 24 hours after the procedure:  Do not: ? Participate in activities where you could fall or become injured. ? Drive. ? Use heavy machinery. ? Drink alcohol. ? Take sleeping pills or medicines that cause drowsiness. ? Make important decisions or sign legal documents. ? Take care of children on your own.  Rest. Eating and drinking  Follow the diet recommended by your health care provider.  If you vomit: ? Drink water, juice, or soup when you can drink without vomiting. ? Make sure you have little or no nausea before eating solid foods. General instructions  Have a responsible adult stay with you until you are awake and alert.  Take over-the-counter and prescription medicines only as told by your health care provider.  If you smoke, do not smoke without supervision.  Keep all follow-up  visits as told by your health care provider. This is important. Contact a health care provider if:  You keep feeling nauseous or you keep vomiting.  You feel light-headed.  You develop a rash.  You have a fever. Get help right away if:  You have trouble breathing. This information is not intended to replace advice given to you by your health care provider. Make sure you discuss any questions you have with your health care provider. Document Revised: 04/21/2017 Document Reviewed: 08/29/2015 Elsevier Patient Education  2020 [employer] Inc.   

## 2020-03-19 NOTE — H&P (Signed)
Chief Complaint: Patient was seen in consultation today for port-a-catheter placement   Referring Physician(s): Wyatt Portela  Supervising Physician: Jacqulynn Cadet  Patient Status: [facility] - ED  History of Present Illness: Mario [name] is a 79 y.o. 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 He has been receiving chemotherapy via peripheral veins and the oncology team has had an increasingly difficult time obtaining access. Patient is familiar to IR from a right inguinal lymph node biopsy done 06/22/2018.   Interventional Radiology has been asked to evaluate this patient for an image-guided port-a-catheter placement to facilitate his treatment plans.   Past Medical History:  Diagnosis Date  . Anemia   . Arthritis   . CAD (coronary artery disease)    a.  s/p IMI and BMS 1997;  b. Myoview 4/16:  inf scar with peri-infarct ischemia, EF 34%; high risk;  c. LHC 5/16:  3 v CAD >> CABG (free L-LAD, S-OM, S-dRCA)  . Cancer ([place])    skin cancer and left lung area  . Carotid stenosis    a. carotid US 6/16:  bilat ICA 1-39%  . Chronic kidney disease, stage II (mild)   . Chronic systolic CHF (congestive heart failure) ([place])   . COPD (chronic obstructive pulmonary disease) ([place])   . Essential hypertension, benign   . Gout   . HLD (hyperlipidemia)   . Inguinal adenopathy 09/19/2018  . Ischemic cardiomyopathy    a. EF by myoview 4/16 34%; b. LHC 5/16: EF 40-45%;  c. intraop TEE 6/16: EF 45-50%  . Orthostasis   . PAD (peripheral artery disease) (HCC)    Dr. Fletcher Anon  . Pneumonia    hx of  . Type II or unspecified type diabetes mellitus without mention of complication, uncontrolled     Past Surgical History:  Procedure Laterality Date  . ABDOMINAL  AORTAGRAM N/A 10/09/2013   Procedure: ABDOMINAL Maxcine Ham;  Surgeon: Wellington Hampshire, MD;  Location: [facility] CATH [facility];  Service: Cardiovascular;  Laterality: N/A;  . ABDOMINAL AORTOGRAM N/A 12/28/2016   Procedure: ABDOMINAL AORTOGRAM;  Surgeon: Waynetta Sandy, MD;  Location: [facility];  Service: Cardiovascular;  Laterality: N/A;  . ABDOMINAL AORTOGRAM W/LOWER EXTREMITY Bilateral 09/11/2018   Procedure: ABDOMINAL AORTOGRAM W/LOWER EXTREMITY;  Surgeon: Serafina Mitchell, MD;  Location: [facility];  Service: Cardiovascular;  Laterality: Bilateral;  . CARDIAC CATHETERIZATION N/A 10/02/2014   Procedure: Left Heart Cath and Coronary Angiography;  Surgeon: Belva Crome, MD;  Location: [facility];  Service: Cardiovascular;  Laterality: N/A;  . CATARACT EXTRACTION    . CHOLECYSTECTOMY    . COLONOSCOPY    . CORONARY ARTERY BYPASS GRAFT N/A 11/03/2014   Procedure: CORONARY ARTERY BYPASS GRAFTING  times three using left internal mammary and right greater saphenous vein;  Surgeon: Gaye Pollack, MD;  Location: [facility] OR;  Service: Open Heart Surgery;  Laterality: N/A;  . FEMORAL-POPLITEAL BYPASS GRAFT Left 09/18/2018   Procedure: LEFT ILEO FEMORAL ENDARTERECTOMY WITH VEIN PATCH AND ANGIOPLASTY, POPLITEAL  ENDARTERECTOMY, FEM POP BYPASS;  Surgeon: Serafina Mitchell, MD;  Location: [facility];  Service: Vascular;  Laterality: Left;  . HERNIA REPAIR    . LOWER EXTREMITY ANGIOGRAPHY Bilateral 12/28/2016   Procedure: Lower Extremity Angiography;  Surgeon: Waynetta Sandy,  MD;  Location: [place];  Service: Cardiovascular;  Laterality: Bilateral;  . LYMPH NODE DISSECTION Right 09/19/2018   Procedure: SUPERFICIAL RIGHT LYMPH NODE DISSECTION;  Surgeon: Fanny Skates, MD;  Location: [facility];  Service: General;  Laterality: Right;  . MELANOMA EXCISION    . PERIPHERAL VASCULAR BALLOON ANGIOPLASTY Right 12/28/2016   Procedure: PERIPHERAL VASCULAR BALLOON ANGIOPLASTY;  Surgeon: Waynetta Sandy, MD;  Location: [facility];  Service: Cardiovascular;  Laterality: Right;  SFA UNSUCCESSFUL PTA  UNABLE TO CROSS LESION  . SKIN LESION EXCISION     multiple  . TEE WITHOUT CARDIOVERSION N/A 11/03/2014   Procedure: TRANSESOPHAGEAL ECHOCARDIOGRAM (TEE);  Surgeon: Gaye Pollack, MD;  Location: [facility];  Service: Open Heart Surgery;  Laterality: N/A;  . VIDEO BRONCHOSCOPY WITH ENDOBRONCHIAL NAVIGATION N/A 11/22/2018   Procedure: VIDEO BRONCHOSCOPY WITH ENDOBRONCHIAL NAVIGATION;  Surgeon: [name], MD;  Location: [facility];  Service: Thoracic;  Laterality: N/A;    Allergies: Penicillins, Plavix [clopidogrel bisulfate], and Pletal [cilostazol]  Medications: Prior to Admission medications   Medication Sig Start Date End Date Taking? Authorizing Provider  allopurinol (ZYLOPRIM) 100 MG tablet Take 1 tablet by mouth daily Patient taking differently: Take 100 mg by mouth at bedtime.  12/11/17   [name], MD  ARTIFICIAL TEAR OP Apply 1 drop to eye daily as needed (dry eyes).    [provider]  aspirin EC 81 MG tablet Take 81 mg by mouth daily.    [provider]  atorvastatin (LIPITOR) 40 MG tablet take 1 tablet by mouth daily at 6:00pm  Patient taking differently: Take 40 mg by mouth at bedtime.  12/25/17   [name], MD  Blood Glucose Monitoring Suppl (FREESTYLE FREEDOM LITE) w/Device KIT USE TO check blood sugar TWICE DAILY 10/17/19   [provider]  Blood Glucose Monitoring Suppl (FREESTYLE LITE) DEVI USE TO TEST BLOOD SUGAR TWICE DAILY. DX:E11.65 10/17/19   [name], MD  calcium carbonate (TUMS - DOSED IN MG ELEMENTAL CALCIUM) 500 MG chewable tablet Chew 1 tablet by mouth daily as needed for indigestion or heartburn.    [provider]  carvedilol (COREG) 3.125 MG tablet TAKE 1 TABLET BY MOUTH TWICE A DAY Patient taking differently: Take 3.125 mg by mouth 2 (two) times daily with a meal.  01/01/18   Belva Crome, MD   Cholecalciferol (VITAMIN D) 2000 units CAPS Take 2,000 Units by mouth daily.    [provider]  fenofibrate micronized (ANTARA) 43 MG capsule Take 43 mg by mouth every evening.    [provider]  ferrous sulfate 325 (65 FE) MG tablet Take 325 mg by mouth daily. Patient not taking: Reported on 03/16/2020    [provider]  finasteride (PROSCAR) 5 MG tablet Take 5 mg by mouth every evening.  09/24/15   [provider]  folic acid (FOLVITE) 732 MCG tablet Take 800 mcg by mouth daily.  Patient not taking: Reported on 03/16/2020    [provider]  glipiZIDE (GLUCOTROL XL) 10 MG 24 hr tablet SMARTSIG:1 Tablet(s) By Mouth Every Evening 02/12/20   [provider]  glucose blood (FREESTYLE LITE) test strip Use as instructed to check blood sugar 2 times per day dx code E11.65 10/03/16   [name], MD  insulin glargine (LANTUS SOLOSTAR) 100 UNIT/ML Solostar Pen Inject 10 Units into the skin daily. 12/19/19   [name], MD  Insulin Pen Needle 31G X 5 MM MISC Use once  a day to inject Lantus 12/19/19   [name], MD  INVOKANA 100 MG TABS tablet TAKE ONE TABLET BY MOUTH ONCE DAILY BEFORE BREAKFAST 01/06/20   [name], MD  Lancets (FREESTYLE) lancets Use as instructed to check blood sugar 2 times per day dx code E11.65 09/15/15   [name], MD  lidocaine-prilocaine (EMLA) cream Apply 1 application topically as needed. Patient not taking: Reported on 03/16/2020 03/12/20   Wyatt Portela, MD  metFORMIN (GLUCOPHAGE) 1000 MG tablet TAKE 1 TABLET BY MOUTH TWICE DAILY FOR BREAKFAST AND DINNER FOR DIABETES. Patient taking differently: Take 1,000 mg by mouth 2 (two) times daily with a meal.  02/08/18   [name], MD  miglitol (GLYSET) 50 MG tablet Take 1 tablet (50 mg total) by mouth daily with supper. 01/29/20   [name], MD  Multiple Vitamins-Minerals (EQ VISION FORMULA 50+) CAPS Take 1 capsule by mouth daily.    [provider]  prochlorperazine  (COMPAZINE) 10 MG tablet Take 1 tablet (10 mg total) by mouth every 6 (six) hours as needed for nausea or vomiting. 03/12/20   Wyatt Portela, MD  tamsulosin (FLOMAX) 0.4 MG CAPS capsule Take 0.4 mg by mouth 2 (two) times daily.    [provider]  Ubiquinol (QUNOL COQ10/UBIQUINOL/MEGA) 100 MG CAPS Take 1 capsule by mouth daily. Patient not taking: Reported on 03/16/2020    [provider]  VICTOZA 18 MG/3ML SOPN INJECT 1.8MG INTO THE SKIN DAILYAT DINNERTIME FOR DIABETES. 07/23/19   [name], MD  vitamin B-12 (CYANOCOBALAMIN) 1000 MCG tablet Take 1,000 mcg by mouth daily.    [provider]     Family History  Problem Relation Age of Onset  . Colon cancer Father   . Heart disease Mother   . Non-Hodgkin's lymphoma Daughter     Social History   Socioeconomic History  . Marital status: Married    Spouse name: Pamala Hurry  . Number of children: 3  . Years of education: Not on file  . Highest education level: Professional school degree (e.g., MD, DDS, DVM, JD)  Occupational History  . 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 Smoking status: Former Smoker    Packs/day: 0.50    Years: 50.00    Pack years: 25.00    Types: Cigarettes    Quit date: 10/22/2014    Years since quitting: 5.4  . Smokeless tobacco: Never Used  Vaping Use  . Vaping Use: Never used  Substance and Sexual Activity  . Alcohol use: No    Alcohol/week: 0.0 standard drinks  . Drug use: No  . Sexual activity: Not on file  Other Topics Concern  . Not on file  Social History Narrative  . Not on file   Social Determinants of Health   Financial Resource Strain: Low Risk   . Difficulty of Paying Living Expenses: Not hard at all  Food Insecurity: No Food Insecurity  . Worried About Charity fundraiser in the Last Year: Never true  . Ran Out of Food in the Last Year: Never true  Transportation Needs: No  Transportation Needs  . Lack of Transportation (Medical): No  . Lack of Transportation (Non-Medical): No  Physical Activity: Insufficiently Active  . Days of Exercise per Week: 2 days  . Minutes of Exercise per Session: 30 min  Stress: No Stress Concern Present  . Feeling of Stress : Only a little  Social Connections: Unknown  . Frequency of Communication with Friends and Family: More than three times a week  . Frequency of Social Gatherings with Friends and Family: Not on file  . Attends Religious Services: Not on file  . Active Member of Clubs or Organizations: Not on file  . Attends Archivist Meetings: Not on file  . Marital Status: Married    Review of Systems: A 12 point ROS discussed and pertinent positives are indicated in the HPI above.  All other systems are negative.  Review of Systems  Constitutional: Negative for appetite change and fatigue.  Respiratory: Positive for shortness of breath. Negative for cough.   Cardiovascular: Negative for chest pain and leg swelling.  Gastrointestinal: Negative for abdominal pain, diarrhea, nausea and vomiting.  Musculoskeletal: Negative for back pain.  Skin:       Scattered scabs, bruises and other lesions.   Neurological: Negative for dizziness and headaches.    Vital Signs: BP (!) 143/79   Pulse 84   Temp 98.1 F (36.7 C) (Oral)   Resp 16   SpO2 100%   Physical Exam Constitutional:      General: He is not in acute distress. HENT:     Mouth/Throat:     Mouth: Mucous membranes are moist.     Pharynx: Oropharynx is clear.  Cardiovascular:     Rate and Rhythm: Normal rate and regular rhythm.     Pulses: Normal pulses.     Heart sounds: Normal heart sounds.  Pulmonary:     Effort: Pulmonary effort is normal.     Breath sounds: Normal breath sounds.  Abdominal:     General: Bowel sounds are normal.     Palpations: Abdomen is soft.  Skin:    General: Skin is warm and dry.     Comments: Scattered scabs,  bruises and other small lesions.   Neurological:     Mental Status: He is alert and oriented to person, place, and time.     Imaging: No results found.  Labs:  CBC: Recent Labs    02/21/20 1039 03/12/20 0817  WBC 8.1 8.2  HGB 9.8* 9.3*  HCT 31.8* 30.0*  PLT 208 203    COAGS: No results for input(s): INR, APTT in the last 8760 hours.  BMP: Recent Labs    12/17/19 1107 01/21/20 1043 02/21/20 1039 03/12/20 0817  NA 137 138 137 135  K 4.4 3.9 4.1 4.1  CL 111 108 109 109  CO2 20 20 21* 17*  GLUCOSE 185* 126* 143* 272*  BUN 31* 28* 31* 36*  CALCIUM 9.3 9.1 9.6 9.3  CREATININE 1.48 1.38 1.64* 1.50*  GFRNONAA  --   --  39* 47*  GFRAA  --   --  46*  --     LIVER FUNCTION TESTS: Recent Labs    06/10/19 1033 02/21/20 1039 03/12/20 0817  BILITOT 0.8 0.7 0.5  AST 28 23 14*  ALT 50 32 21  ALKPHOS 73 92 78  PROT 6.3 7.2 6.4*  ALBUMIN 4.0 3.4* 3.0*    TUMOR MARKERS: No results for input(s): AFPTM, CEA, CA199, CHROMGRNA in the last 8760 hours.  Assessment and Plan:  Metastatic squamous cell carcinoma of the lung: Volney L. Scifres, 79 year old male, presents today to the [facility] department for an image-guided port-a-catheter placement.  Risks and benefits of an image-guided Port-a-catheter placement were discussed with the patient including, but not limited to bleeding, infection, pneumothorax, or fibrin sheath development  and need for additional procedures.  All of the patient's questions were answered, patient is agreeable to proceed.  He has been NPO. Vitals have been reviewed. Labs are pending but will be reviewed prior to the start of the procedure.   Consent signed and in chart.  Thank you for this interesting consult.  I greatly enjoyed meeting STEED KANAAN and look forward to participating in their care.  A copy of this report was sent to the requesting provider on this date.  Electronically Signed: Soyla Dryer,  AGACNP-BC [phone] 03/19/2020, 2:14 PM   I spent a total of  30 Minutes   in face to face in clinical consultation, greater than 50% of which was counseling/coordinating care for port-a-catheter placement.

## 2020-03-19 NOTE — Procedures (Signed)
Interventional Radiology Procedure Note  Procedure: Placement of a right IJ approach single lumen PowerPort.  Tip is positioned at the superior cavoatrial junction and catheter is ready for immediate use.  Complications: No immediate Recommendations:  - Ok to shower tomorrow - Do not submerge for 7 days - Routine line care   Signed,  Tondalaya Perren K. Abbigayle Toole, MD   

## 2020-03-20 DIAGNOSIS — N183 Chronic kidney disease, stage 3 unspecified: Secondary | ICD-10-CM | POA: Diagnosis not present

## 2020-03-20 DIAGNOSIS — E7800 Pure hypercholesterolemia, unspecified: Secondary | ICD-10-CM | POA: Diagnosis not present

## 2020-03-20 DIAGNOSIS — I10 Essential (primary) hypertension: Secondary | ICD-10-CM | POA: Diagnosis not present

## 2020-03-20 DIAGNOSIS — N401 Enlarged prostate with lower urinary tract symptoms: Secondary | ICD-10-CM | POA: Diagnosis not present

## 2020-03-20 DIAGNOSIS — E11319 Type 2 diabetes mellitus with unspecified diabetic retinopathy without macular edema: Secondary | ICD-10-CM | POA: Diagnosis not present

## 2020-03-20 DIAGNOSIS — C3492 Malignant neoplasm of unspecified part of left bronchus or lung: Secondary | ICD-10-CM | POA: Diagnosis not present

## 2020-03-20 DIAGNOSIS — I2510 Atherosclerotic heart disease of native coronary artery without angina pectoris: Secondary | ICD-10-CM | POA: Diagnosis not present

## 2020-03-20 DIAGNOSIS — E1151 Type 2 diabetes mellitus with diabetic peripheral angiopathy without gangrene: Secondary | ICD-10-CM | POA: Diagnosis not present

## 2020-03-20 DIAGNOSIS — C3490 Malignant neoplasm of unspecified part of unspecified bronchus or lung: Secondary | ICD-10-CM | POA: Diagnosis not present

## 2020-03-20 DIAGNOSIS — E782 Mixed hyperlipidemia: Secondary | ICD-10-CM | POA: Diagnosis not present

## 2020-03-20 DIAGNOSIS — E1122 Type 2 diabetes mellitus with diabetic chronic kidney disease: Secondary | ICD-10-CM | POA: Diagnosis not present

## 2020-03-24 DIAGNOSIS — I2510 Atherosclerotic heart disease of native coronary artery without angina pectoris: Secondary | ICD-10-CM | POA: Diagnosis not present

## 2020-03-24 DIAGNOSIS — E7800 Pure hypercholesterolemia, unspecified: Secondary | ICD-10-CM | POA: Diagnosis not present

## 2020-03-24 DIAGNOSIS — E1151 Type 2 diabetes mellitus with diabetic peripheral angiopathy without gangrene: Secondary | ICD-10-CM | POA: Diagnosis not present

## 2020-03-24 DIAGNOSIS — E1122 Type 2 diabetes mellitus with diabetic chronic kidney disease: Secondary | ICD-10-CM | POA: Diagnosis not present

## 2020-03-24 DIAGNOSIS — N183 Chronic kidney disease, stage 3 unspecified: Secondary | ICD-10-CM | POA: Diagnosis not present

## 2020-03-24 DIAGNOSIS — C3492 Malignant neoplasm of unspecified part of left bronchus or lung: Secondary | ICD-10-CM | POA: Diagnosis not present

## 2020-03-24 DIAGNOSIS — E11319 Type 2 diabetes mellitus with unspecified diabetic retinopathy without macular edema: Secondary | ICD-10-CM | POA: Diagnosis not present

## 2020-03-24 DIAGNOSIS — I10 Essential (primary) hypertension: Secondary | ICD-10-CM | POA: Diagnosis not present

## 2020-03-24 DIAGNOSIS — N401 Enlarged prostate with lower urinary tract symptoms: Secondary | ICD-10-CM | POA: Diagnosis not present

## 2020-03-24 DIAGNOSIS — E782 Mixed hyperlipidemia: Secondary | ICD-10-CM | POA: Diagnosis not present

## 2020-03-24 DIAGNOSIS — C3490 Malignant neoplasm of unspecified part of unspecified bronchus or lung: Secondary | ICD-10-CM | POA: Diagnosis not present

## 2020-03-27 ENCOUNTER — Encounter (INDEPENDENT_AMBULATORY_CARE_PROVIDER_SITE_OTHER): Payer: HMO | Admitting: Ophthalmology

## 2020-03-27 ENCOUNTER — Other Ambulatory Visit: Payer: Self-pay

## 2020-03-27 DIAGNOSIS — H35033 Hypertensive retinopathy, bilateral: Secondary | ICD-10-CM

## 2020-03-27 DIAGNOSIS — H43813 Vitreous degeneration, bilateral: Secondary | ICD-10-CM

## 2020-03-27 DIAGNOSIS — I10 Essential (primary) hypertension: Secondary | ICD-10-CM | POA: Diagnosis not present

## 2020-03-27 DIAGNOSIS — H353122 Nonexudative age-related macular degeneration, left eye, intermediate dry stage: Secondary | ICD-10-CM | POA: Diagnosis not present

## 2020-03-27 DIAGNOSIS — H353111 Nonexudative age-related macular degeneration, right eye, early dry stage: Secondary | ICD-10-CM

## 2020-03-30 ENCOUNTER — Telehealth: Payer: Self-pay | Admitting: *Deleted

## 2020-03-30 ENCOUNTER — Encounter (INDEPENDENT_AMBULATORY_CARE_PROVIDER_SITE_OTHER): Payer: HMO | Admitting: Ophthalmology

## 2020-03-30 NOTE — Telephone Encounter (Signed)
Patient called c/o 'lump" is his right groin.  He describes it as "large" and sore.  He states that he has an appointment with his oncologist Wednesday, 03/01/2020.  He is going to call back if oncologist recommends.

## 2020-03-31 ENCOUNTER — Other Ambulatory Visit: Payer: Self-pay

## 2020-03-31 ENCOUNTER — Other Ambulatory Visit (INDEPENDENT_AMBULATORY_CARE_PROVIDER_SITE_OTHER): Payer: HMO

## 2020-03-31 DIAGNOSIS — E1165 Type 2 diabetes mellitus with hyperglycemia: Secondary | ICD-10-CM | POA: Diagnosis not present

## 2020-03-31 LAB — HEMOGLOBIN A1C: Hgb A1c MFr Bld: 7.6 % — ABNORMAL HIGH (ref 4.6–6.5)

## 2020-03-31 LAB — BASIC METABOLIC PANEL
BUN: 31 mg/dL — ABNORMAL HIGH (ref 6–23)
CO2: 20 mEq/L (ref 19–32)
Calcium: 9 mg/dL (ref 8.4–10.5)
Chloride: 105 mEq/L (ref 96–112)
Creatinine, Ser: 1.43 mg/dL (ref 0.40–1.50)
GFR: 46.78 mL/min — ABNORMAL LOW (ref >60.00)
Glucose, Bld: 151 mg/dL — ABNORMAL HIGH (ref 70–99)
Potassium: 4.1 mEq/L (ref 3.5–5.1)
Sodium: 134 mEq/L — ABNORMAL LOW (ref 135–145)

## 2020-04-01 ENCOUNTER — Inpatient Hospital Stay (HOSPITAL_BASED_OUTPATIENT_CLINIC_OR_DEPARTMENT_OTHER): Payer: HMO | Admitting: Oncology

## 2020-04-01 ENCOUNTER — Inpatient Hospital Stay: Payer: HMO

## 2020-04-01 ENCOUNTER — Inpatient Hospital Stay: Payer: HMO | Attending: Oncology

## 2020-04-01 ENCOUNTER — Encounter: Payer: Self-pay | Admitting: Oncology

## 2020-04-01 VITALS — BP 143/87 | HR 81 | Temp 97.9°F | Resp 24 | Ht 74.0 in | Wt 154.1 lb

## 2020-04-01 DIAGNOSIS — Z5112 Encounter for antineoplastic immunotherapy: Secondary | ICD-10-CM | POA: Insufficient documentation

## 2020-04-01 DIAGNOSIS — Z85828 Personal history of other malignant neoplasm of skin: Secondary | ICD-10-CM | POA: Diagnosis not present

## 2020-04-01 DIAGNOSIS — C44722 Squamous cell carcinoma of skin of right lower limb, including hip: Secondary | ICD-10-CM

## 2020-04-01 DIAGNOSIS — C3490 Malignant neoplasm of unspecified part of unspecified bronchus or lung: Secondary | ICD-10-CM

## 2020-04-01 DIAGNOSIS — Z923 Personal history of irradiation: Secondary | ICD-10-CM | POA: Diagnosis not present

## 2020-04-01 DIAGNOSIS — Z79899 Other long term (current) drug therapy: Secondary | ICD-10-CM | POA: Insufficient documentation

## 2020-04-01 DIAGNOSIS — C3412 Malignant neoplasm of upper lobe, left bronchus or lung: Secondary | ICD-10-CM | POA: Diagnosis not present

## 2020-04-01 LAB — TSH: TSH: 2.654 u[IU]/mL (ref 0.320–4.118)

## 2020-04-01 LAB — CBC WITH DIFFERENTIAL (CANCER CENTER ONLY)
Abs Immature Granulocytes: 0.03 10*3/uL (ref 0.00–0.07)
Basophils Absolute: 0 10*3/uL (ref 0.0–0.1)
Basophils Relative: 0 %
Eosinophils Absolute: 0.2 10*3/uL (ref 0.0–0.5)
Eosinophils Relative: 3 %
HCT: 30.1 % — ABNORMAL LOW (ref 39.0–52.0)
Hemoglobin: 9.5 g/dL — ABNORMAL LOW (ref 13.0–17.0)
Immature Granulocytes: 0 %
Lymphocytes Relative: 15 %
Lymphs Abs: 1 10*3/uL (ref 0.7–4.0)
MCH: 25.3 pg — ABNORMAL LOW (ref 26.0–34.0)
MCHC: 31.6 g/dL (ref 30.0–36.0)
MCV: 80.3 fL (ref 80.0–100.0)
Monocytes Absolute: 0.5 10*3/uL (ref 0.1–1.0)
Monocytes Relative: 8 %
Neutro Abs: 5 10*3/uL (ref 1.7–7.7)
Neutrophils Relative %: 74 %
Platelet Count: 269 10*3/uL (ref 150–400)
RBC: 3.75 MIL/uL — ABNORMAL LOW (ref 4.22–5.81)
RDW: 15.9 % — ABNORMAL HIGH (ref 11.5–15.5)
WBC Count: 6.8 10*3/uL (ref 4.0–10.5)
nRBC: 0 % (ref 0.0–0.2)

## 2020-04-01 LAB — CMP (CANCER CENTER ONLY)
ALT: 29 U/L (ref 0–44)
AST: 21 U/L (ref 15–41)
Albumin: 3.1 g/dL — ABNORMAL LOW (ref 3.5–5.0)
Alkaline Phosphatase: 73 U/L (ref 38–126)
Anion gap: 8 (ref 5–15)
BUN: 29 mg/dL — ABNORMAL HIGH (ref 8–23)
CO2: 20 mmol/L — ABNORMAL LOW (ref 22–32)
Calcium: 9.4 mg/dL (ref 8.9–10.3)
Chloride: 110 mmol/L (ref 98–111)
Creatinine: 1.49 mg/dL — ABNORMAL HIGH (ref 0.61–1.24)
GFR, Estimated: 48 mL/min — ABNORMAL LOW (ref >60)
Glucose, Bld: 94 mg/dL (ref 70–99)
Potassium: 3.7 mmol/L (ref 3.5–5.1)
Sodium: 138 mmol/L (ref 135–145)
Total Bilirubin: 0.6 mg/dL (ref 0.3–1.2)
Total Protein: 6.9 g/dL (ref 6.5–8.1)

## 2020-04-01 MED ORDER — HEPARIN SOD (PORK) LOCK FLUSH 100 UNIT/ML IV SOLN
500.0000 [IU] | Freq: Once | INTRAVENOUS | Status: AC | PRN
  Administered 2020-04-01: 500 [IU]
  Filled 2020-04-01: qty 5

## 2020-04-01 MED ORDER — SODIUM CHLORIDE 0.9 % IV SOLN
Freq: Once | INTRAVENOUS | Status: AC
  Filled 2020-04-01: qty 250

## 2020-04-01 MED ORDER — SODIUM CHLORIDE 0.9% FLUSH
10.0000 mL | INTRAVENOUS | Status: DC | PRN
  Administered 2020-04-01: 10 mL
  Filled 2020-04-01: qty 10

## 2020-04-01 MED ORDER — SODIUM CHLORIDE 0.9 % IV SOLN
200.0000 mg | Freq: Once | INTRAVENOUS | Status: AC
  Administered 2020-04-01: 200 mg via INTRAVENOUS
  Filled 2020-04-01: qty 8

## 2020-04-01 NOTE — Patient Instructions (Signed)
[facility] Discharge Instructions for Patients Receiving Chemotherapy  Today you received the following chemotherapy agents: pembrolizumab Beryle Flock).   To help prevent nausea and vomiting after your treatment, we encourage you to take your nausea medication as directed.   If you develop nausea and vomiting that is not controlled by your nausea medication, call the clinic.   BELOW ARE SYMPTOMS THAT SHOULD BE REPORTED IMMEDIATELY:  *FEVER GREATER THAN 100.5 F  *CHILLS WITH OR WITHOUT FEVER  NAUSEA AND VOMITING THAT IS NOT CONTROLLED WITH YOUR NAUSEA MEDICATION  *UNUSUAL SHORTNESS OF BREATH  *UNUSUAL BRUISING OR BLEEDING  TENDERNESS IN MOUTH AND THROAT WITH OR WITHOUT PRESENCE OF ULCERS  *URINARY PROBLEMS  *BOWEL PROBLEMS  UNUSUAL RASH Items with * indicate a potential emergency and should be followed up as soon as possible.  Feel free to call the clinic should you have any questions or concerns. The clinic phone number is [phone].  Please show the [facility] at check-in to the Emergency Department and triage nurse.

## 2020-04-01 NOTE — Progress Notes (Signed)
Hematology and Oncology Follow Up   Mario [name] [id number] 24-Jan-1941 79 y.o. 04/01/2020 9:15 AM [name], [name], MDPolite, [name], MD       Principle Diagnosis: 79 year old man with lung cancer diagnosed in February 2020. He was found to have stage IIb squamous cell carcinoma with disease relapse in September 2021 including left mediastinal lymph node involvement.   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      Prior Therapy: Status post surgical excision of a right knee skin lesion.  Pathology showed squamous cell carcinoma in December 2019.  He is status post superficial inguinal lymph node dissection completed by Dr. Dalbert Batman on September 19, 2018.  He is status post radiation therapy to the left lower lobe of the lung between July 15 and December 14, 2018.  He received 60 Gray in 5 fractions.  He developed relapsed disease in September 2021 with local recurrence in the left upper lobe as well as left sided mediastinal adenopathy.   Current therapy: Pembrolizumab 200 mg every 3 weeks started on February 21, 2020.  He is here for cycle 3 of therapy.  Interim History: [name] presents today for a follow-up evaluation. Since the last visit, he reports no major changes in his health.  He tolerated Pembrolizumab without any major concerns.  He denies any nausea, vomiting or abdominal pain.  Denies any diarrhea or dyspnea on exertion.  He is eating reasonably fair although he has lost few pounds since last visit.  His performance status and quality of life remains unchanged.    Medications: Unchanged on review. Current Outpatient Medications  Medication Sig Dispense Refill  . allopurinol (ZYLOPRIM) 100 MG tablet Take 1 tablet by mouth daily (Patient taking differently: Take 100 mg by mouth at bedtime. ) 90 tablet 0  . ARTIFICIAL TEAR OP Apply 1 drop to eye daily as needed (dry eyes).    Marland Kitchen aspirin EC 81  MG tablet Take 81 mg by mouth daily.    Marland Kitchen atorvastatin (LIPITOR) 40 MG tablet take 1 tablet by mouth daily at 6:00pm  (Patient taking differently: Take 40 mg by mouth at bedtime. ) 90 tablet 0  . Blood Glucose Monitoring Suppl (FREESTYLE FREEDOM LITE) w/Device KIT USE TO check blood sugar TWICE DAILY    . Blood Glucose Monitoring Suppl (FREESTYLE LITE) DEVI USE TO TEST BLOOD SUGAR TWICE DAILY. DX:E11.65 1 each 1  . calcium carbonate (TUMS - DOSED IN MG ELEMENTAL CALCIUM) 500 MG chewable tablet Chew 1 tablet by mouth daily as needed for indigestion or heartburn.    . carvedilol (COREG) 3.125 MG tablet TAKE 1 TABLET BY MOUTH TWICE A DAY (Patient taking differently: Take 3.125 mg by mouth 2 (two) times daily with a meal. ) 180 tablet 3  . Cholecalciferol (VITAMIN D) 2000 units CAPS Take 2,000 Units by mouth daily.    . fenofibrate micronized (ANTARA) 43 MG capsule Take 43 mg by mouth every evening.    . ferrous sulfate 325 (65 FE) MG tablet Take 325 mg by mouth daily.     . finasteride (PROSCAR) 5 MG tablet Take 5 mg by mouth every evening.   3  . folic acid (FOLVITE) 606 MCG tablet Take 800 mcg by mouth daily.  (Patient not taking: Reported on 03/16/2020)    . glipiZIDE (GLUCOTROL XL) 10 MG 24 hr tablet SMARTSIG:1 Tablet(s) By Mouth Every Evening    . glucose blood (FREESTYLE LITE) test strip Use  as instructed to check blood sugar 2 times per day dx code E11.65 100 each 3  . insulin glargine (LANTUS SOLOSTAR) 100 UNIT/ML Solostar Pen Inject 10 Units into the skin daily. 5 pen 1  . Insulin Pen Needle 31G X 5 MM MISC Use once a day to inject Lantus 50 each 1  . INVOKANA 100 MG TABS tablet TAKE ONE TABLET BY MOUTH ONCE DAILY BEFORE BREAKFAST 30 tablet 3  . Lancets (FREESTYLE) lancets Use as instructed to check blood sugar 2 times per day dx code E11.65 100 each 3  . lidocaine-prilocaine (EMLA) cream Apply 1 application topically as needed. (Patient not taking: Reported on 03/16/2020) 30 g 0  . metFORMIN  (GLUCOPHAGE) 1000 MG tablet TAKE 1 TABLET BY MOUTH TWICE DAILY FOR BREAKFAST AND DINNER FOR DIABETES. (Patient taking differently: Take 1,000 mg by mouth 2 (two) times daily with a meal. ) 60 tablet 2  . miglitol (GLYSET) 50 MG tablet Take 1 tablet (50 mg total) by mouth daily with supper. 30 tablet 3  . Multiple Vitamins-Minerals (EQ VISION FORMULA 50+) CAPS Take 1 capsule by mouth daily.    . prochlorperazine (COMPAZINE) 10 MG tablet Take 1 tablet (10 mg total) by mouth every 6 (six) hours as needed for nausea or vomiting. 30 tablet 0  . tamsulosin (FLOMAX) 0.4 MG CAPS capsule Take 0.4 mg by mouth 2 (two) times daily.    Marland Kitchen Ubiquinol (QUNOL COQ10/UBIQUINOL/MEGA) 100 MG CAPS Take 1 capsule by mouth daily. (Patient not taking: Reported on 03/16/2020)    . VICTOZA 18 MG/3ML SOPN INJECT 1.8MG INTO THE SKIN DAILYAT DINNERTIME FOR DIABETES. 9 mL 3  . vitamin B-12 (CYANOCOBALAMIN) 1000 MCG tablet Take 1,000 mcg by mouth daily.     No current facility-administered medications for this visit.     Allergies:  Allergies  Allergen Reactions  . Penicillins Anaphylaxis    Has patient had a PCN reaction causing immediate rash, facial/tongue/throat swelling, SOB or lightheadedness with hypotension: Yes Has patient had a PCN reaction causing severe rash involving mucus membranes or skin necrosis: No Has patient had a PCN reaction that required hospitalization: Yes Has patient had a PCN reaction occurring within the last 10 years: Unknown If all of the above answers are "NO", then may proceed with Cephalosporin use.   Marland Kitchen Plavix [Clopidogrel Bisulfate] Hives  . Pletal [Cilostazol] Itching    Physical exam Blood pressure (!) 143/87, pulse 81, temperature 97.9 F (36.6 C), temperature source Oral, resp. rate (!) 24, height _0  (1.88 m), weight 154 lb 1.6 oz (69.9 kg), SpO2 100 %.   ECOG 1    General appearance: Comfortable appearing without any discomfort Head: Normocephalic without any  trauma Oropharynx: Mucous membranes are moist and pink without any thrush or ulcers. Eyes: Pupils are equal and round reactive to light. Lymph nodes: No cervical, supraclavicular, inguinal or axillary lymphadenopathy.   Heart:regular rate and rhythm.  S1 and S2 without leg edema. Lung: Clear without any rhonchi or wheezes.  No dullness to percussion. Abdomin: Soft, nontender, nondistended with good bowel sounds.  No hepatosplenomegaly. Musculoskeletal: No joint deformity or effusion.  Full range of motion noted. Neurological: No deficits noted on motor, sensory and deep tendon reflex exam. Skin: No petechial rash or dryness.  Appeared moist.  Psychiatric: Mood and affect appeared appropriate.     Lab Results: Lab Results  Component Value Date   WBC 6.2 03/19/2020   HGB 9.2 (L) 03/19/2020   HCT 29.8 (L) 03/19/2020  MCV 83.9 03/19/2020   PLT 223 03/19/2020     Chemistry      Component Value Date/Time   NA 134 (L) 03/31/2020 1011   K 4.1 03/31/2020 1011   CL 105 03/31/2020 1011   CO2 20 03/31/2020 1011   BUN 31 (H) 03/31/2020 1011   CREATININE 1.43 03/31/2020 1011   CREATININE 1.50 (H) 03/12/2020 0817   CREATININE 1.14 08/13/2013 1043      Component Value Date/Time   CALCIUM 9.0 03/31/2020 1011   ALKPHOS 78 03/12/2020 0817   AST 14 (L) 03/12/2020 0817   ALT 21 03/12/2020 0817   BILITOT 0.5 03/12/2020 0817          Impression and Plan:  79 year old man with:    1. Lung cancer diagnosed in February 2020. He was found to have stage II squamous cell carcinoma and subsequently developed recurrence disease in September 2021 with a left mediastinal lymph node involvement.  He continues to tolerate Pembrolizumab without any major complications. The natural course of this disease was reviewed and alternative treatment options were discussed. Systemic chemotherapy would be utilized if he has progression of disease and immunotherapy. Plan is to update imaging studies  after completing cycle 4 of therapy.  He is agreeable at this time.   2.    Squamous cell carcinoma of the skin.  He will have updated imaging studies to evaluate for possible recurrence.   3.  Antiemetics: Compazine has been successful in controlling his nausea vomiting.   4.  Immune mediated complications: I continue to educate him about immune mediated complications. These include pneumonitis, hepatitis, arthritis and hypophysitis.  5.  IV access: Port-A-Cath inserted without any complications and will be in use moving forward.    6.  Follow-up: He will return in 3 weeks for the next cycle of therapy.    30  minutes were dedicated to this visit. The time was spent on reviewing his disease status, discussing treatment options and future plan of care review.   [name], MD 04/01/2020 9:15 AM

## 2020-04-01 NOTE — Progress Notes (Signed)
Met with patient at registration to introduce myself as Arboriculturist and to offer available resources.  Discussed one-time $1000 Radio broadcast assistant to assist with personal expenses while going through treatment.  Gave him my card if interested in applying and for any additional financial questions or concerns.

## 2020-04-02 ENCOUNTER — Other Ambulatory Visit: Payer: Self-pay

## 2020-04-02 ENCOUNTER — Encounter: Payer: Self-pay | Admitting: Endocrinology

## 2020-04-02 ENCOUNTER — Ambulatory Visit (INDEPENDENT_AMBULATORY_CARE_PROVIDER_SITE_OTHER): Payer: HMO | Admitting: Endocrinology

## 2020-04-02 VITALS — BP 130/66 | HR 74 | Ht 74.0 in | Wt 154.2 lb

## 2020-04-02 DIAGNOSIS — E1165 Type 2 diabetes mellitus with hyperglycemia: Secondary | ICD-10-CM

## 2020-04-02 DIAGNOSIS — N1831 Chronic kidney disease, stage 3a: Secondary | ICD-10-CM

## 2020-04-02 DIAGNOSIS — E782 Mixed hyperlipidemia: Secondary | ICD-10-CM

## 2020-04-02 DIAGNOSIS — I10 Essential (primary) hypertension: Secondary | ICD-10-CM

## 2020-04-02 NOTE — Progress Notes (Signed)
Patient ID: Mario [name], male   DOB: Nov 05, 1940, 79 y.o.   MRN: [id number]   Reason for Appointment: follow-up   History of Present Illness   Diagnosis: Type 2 DIABETES MELITUS, date of diagnosis:  1989     Previous history: He was initially diagnosed when hospitalized for pancreatitis and his previous endocrinologist had treated him with multiple medications because of progression of his diabetes. He was also put on Cycloset  which he has tolerated maximum dose He has had adjustments of his medication dosages the last couple of years and Januvia was restarted in 5/14 when blood sugars were higher. Dosages have been adjusted based on renal function Previously an A1c levels have been ranging from 6.6-7.5 His metformin was increased  when renal function was better and glipizide was changed to glipizide ER 10 mg . Did not appear to have better blood sugar control with Invokana He was started on Victoza for improved control when his A1c was 7.9% in 02/2015  Recent history:    Non-insulin hypoglycemic drugs:   glipizide ER 10 mg , Metformin 1 g a.m., 0.5 g p.m., Victoza 1.8 mg daily, Invokana 100 mg daily , Glyset 25 mg  INSULIN regimen: Lantus 8 units at bedtime  His A1c is 7.6  Previously has been as low as 6.7 Fructosamine is 306, previously was 295  Blood sugar patterns and problems:  He was continued on 8 units LANTUS insulin  With this his fasting blood sugars are excellent, previously did not have his meter for download  Has not had any hypoglycemia and lowest blood sugar is 89, lab glucose 94 yesterday and previously 151  Not clear why his morning sugar fluctuated but he did not check his sugars after meals at all  He has been told by his oncologist not to go to the gym and he is not exercising  His weight is about the same as 2 months ago and he thinks his appetite is fairly good  Also continues low-dose Invokana, current GFR 48   Side effects from  medications: None.  Monitors blood glucose: Once a day or less .    Glucometer: Freestyle    Blood sugars 89-165 recently with average 120, also 15-day average was about the same in October   Meals: 2-3 meals per day, no lunch often (sometimes yogurt); breakfast is at 9 am grits, [place] bacon, sometimes bread and eggs at breakfast; donuts occasionally      Dietician consultation last: 3/16          Wt Readings from Last 3 Encounters:  04/02/20 154 lb 3.2 oz (69.9 kg)  04/01/20 154 lb 1.6 oz (69.9 kg)  03/12/20 158 lb 4.8 oz (71.8 kg)    Lab Results  Component Value Date   HGBA1C 7.6 (H) 03/31/2020   HGBA1C 7.5 (H) 12/17/2019   HGBA1C 7.8 (H) 09/09/2019   Lab Results  Component Value Date   MICROALBUR 9.2 (H) 03/04/2019   LDLCALC 40 06/10/2019   CREATININE 1.49 (H) 04/01/2020     LABS:  Appointment on 04/01/2020  Component Date Value Ref Range Status  . TSH 04/01/2020 2.654  0.320 - 4.118 uIU/mL Final   Performed at [facility], [place] [address], [place], [place] [address]  . Sodium 04/01/2020 138  135 - 145 mmol/L Final  . Potassium 04/01/2020 3.7  3.5 - 5.1 mmol/L Final  . Chloride 04/01/2020 110  98 - 111 mmol/L Final  . CO2 04/01/2020  20* 22 - 32 mmol/L Final  . Glucose, Bld 04/01/2020 94  70 - 99 mg/dL Final   Glucose reference range applies only to samples taken after fasting for at least 8 hours.  . BUN 04/01/2020 29* 8 - 23 mg/dL Final  . Creatinine 04/01/2020 1.49* 0.61 - 1.24 mg/dL Final  . Calcium 04/01/2020 9.4  8.9 - 10.3 mg/dL Final  . Total Protein 04/01/2020 6.9  6.5 - 8.1 g/dL Final  . Albumin 04/01/2020 3.1* 3.5 - 5.0 g/dL Final  . AST 04/01/2020 21  15 - 41 U/L Final  . ALT 04/01/2020 29  0 - 44 U/L Final  . Alkaline Phosphatase 04/01/2020 73  38 - 126 U/L Final  . Total Bilirubin 04/01/2020 0.6  0.3 - 1.2 mg/dL Final  . GFR, Estimated 04/01/2020 48* >60 mL/min Final   Comment: (NOTE) Calculated using the CKD-EPI  Creatinine Equation (2021)   . Anion gap 04/01/2020 8  5 - 15 Final   Performed at [facility], [place] [address], [place], [place] [address]  . WBC Count 04/01/2020 6.8  4.0 - 10.5 K/uL Final  . RBC 04/01/2020 3.75* 4.22 - 5.81 MIL/uL Final  . Hemoglobin 04/01/2020 9.5* 13.0 - 17.0 g/dL Final  . HCT 04/01/2020 30.1* 39 - 52 % Final  . MCV 04/01/2020 80.3  80.0 - 100.0 fL Final  . MCH 04/01/2020 25.3* 26.0 - 34.0 pg Final  . MCHC 04/01/2020 31.6  30.0 - 36.0 g/dL Final  . RDW 04/01/2020 15.9* 11.5 - 15.5 % Final  . Platelet Count 04/01/2020 269  150 - 400 K/uL Final  . nRBC 04/01/2020 0.0  0.0 - 0.2 % Final  . Neutrophils Relative % 04/01/2020 74  % Final  . Neutro Abs 04/01/2020 5.0  1.7 - 7.7 K/uL Final  . Lymphocytes Relative 04/01/2020 15  % Final  . Lymphs Abs 04/01/2020 1.0  0.7 - 4.0 K/uL Final  . Monocytes Relative 04/01/2020 8  % Final  . Monocytes Absolute 04/01/2020 0.5  0.1 - 1.0 K/uL Final  . Eosinophils Relative 04/01/2020 3  % Final  . Eosinophils Absolute 04/01/2020 0.2  0.0 - 0.5 K/uL Final  . Basophils Relative 04/01/2020 0  % Final  . Basophils Absolute 04/01/2020 0.0  0.0 - 0.1 K/uL Final  . Immature Granulocytes 04/01/2020 0  % Final  . Abs Immature Granulocytes 04/01/2020 0.03  0.00 - 0.07 K/uL Final   Performed at [facility], [place] [address] [place], [place] [address]  Lab on 03/31/2020  Component Date Value Ref Range Status  . Sodium 03/31/2020 134* 135 - 145 mEq/L Final  . Potassium 03/31/2020 4.1  3.5 - 5.1 mEq/L Final  . Chloride 03/31/2020 105  96 - 112 mEq/L Final  . CO2 03/31/2020 20  19 - 32 mEq/L Final  . Glucose, Bld 03/31/2020 151* 70 - 99 mg/dL Final  . BUN 03/31/2020 31* 6 - 23 mg/dL Final  . Creatinine, Ser 03/31/2020 1.43  0.40 - 1.50 mg/dL Final  . GFR 03/31/2020 46.78* >60.00 mL/min Final   Calculated using the CKD-EPI Creatinine Equation (2021)  . Calcium 03/31/2020 9.0  8.4 - 10.5 mg/dL  Final  . Hgb A1c MFr Bld 03/31/2020 7.6* 4.6 - 6.5 % Final   Glycemic Control Guidelines for People with Diabetes:Non Diabetic:  <6%Goal of Therapy: <7%Additional Action Suggested:  >8%     Allergies as of 04/02/2020      Reactions   Penicillins Anaphylaxis  Has patient had a PCN reaction causing immediate rash, facial/tongue/throat swelling, SOB or lightheadedness with hypotension: Yes Has patient had a PCN reaction causing severe rash involving mucus membranes or skin necrosis: No Has patient had a PCN reaction that required hospitalization: Yes Has patient had a PCN reaction occurring within the last 10 years: Unknown If all of the above answers are "NO", then may proceed with Cephalosporin use.   Plavix [clopidogrel Bisulfate] Hives   Pletal [cilostazol] Itching      Medication List       Accurate as of April 02, 2020  2:18 PM. If you have any questions, ask your nurse or doctor.        allopurinol 100 MG tablet Commonly known as: ZYLOPRIM Take 1 tablet by mouth daily What changed: when to take this   ARTIFICIAL TEAR OP Apply 1 drop to eye daily as needed (dry eyes).   aspirin EC 81 MG tablet Take 81 mg by mouth daily.   atorvastatin 40 MG tablet Commonly known as: LIPITOR take 1 tablet by mouth daily at 6:00pm What changed: See the new instructions.   calcium carbonate 500 MG chewable tablet Commonly known as: TUMS - dosed in mg elemental calcium Chew 1 tablet by mouth daily as needed for indigestion or heartburn.   carvedilol 3.125 MG tablet Commonly known as: COREG TAKE 1 TABLET BY MOUTH TWICE A DAY What changed: when to take this   EQ Vision Formula 50+ Caps Take 1 capsule by mouth daily.   fenofibrate micronized 43 MG capsule Commonly known as: ANTARA Take 43 mg by mouth every evening.   ferrous sulfate 325 (65 FE) MG tablet Take 325 mg by mouth daily.   finasteride 5 MG tablet Commonly known as: PROSCAR Take 5 mg by mouth every evening.     folic acid 482 MCG tablet Commonly known as: FOLVITE Take 800 mcg by mouth daily.   freestyle lancets Use as instructed to check blood sugar 2 times per day dx code E11.65   FreeStyle Lite Devi USE TO TEST BLOOD SUGAR TWICE DAILY. DX:E11.65   FreeStyle Freedom Lite w/Device Kit USE TO check blood sugar TWICE DAILY   glipiZIDE 10 MG 24 hr tablet Commonly known as: GLUCOTROL XL SMARTSIG:1 Tablet(s) By Mouth Every Evening   glucose blood test strip Commonly known as: FREESTYLE LITE Use as instructed to check blood sugar 2 times per day dx code E11.65   Insulin Pen Needle 31G X 5 MM Misc Use once a day to inject Lantus   Invokana 100 MG Tabs tablet Generic drug: canagliflozin TAKE ONE TABLET BY MOUTH ONCE DAILY BEFORE BREAKFAST   Lantus SoloStar 100 UNIT/ML Solostar Pen Generic drug: insulin glargine Inject 10 Units into the skin daily. What changed: how much to take   lidocaine-prilocaine cream Commonly known as: EMLA Apply 1 application topically as needed.   metFORMIN 1000 MG tablet Commonly known as: GLUCOPHAGE TAKE 1 TABLET BY MOUTH TWICE DAILY FOR BREAKFAST AND DINNER FOR DIABETES. What changed: See the new instructions.   miglitol 50 MG tablet Commonly known as: GLYSET Take 1 tablet (50 mg total) by mouth daily with supper.   prochlorperazine 10 MG tablet Commonly known as: COMPAZINE Take 1 tablet (10 mg total) by mouth every 6 (six) hours as needed for nausea or vomiting.   Qunol CoQ10/Ubiquinol/Mega 100 MG Caps Generic drug: Ubiquinol Take 1 capsule by mouth daily.   tamsulosin 0.4 MG Caps capsule Commonly known as: FLOMAX Take 0.4 mg  by mouth 2 (two) times daily.   Victoza 18 MG/3ML Sopn Generic drug: liraglutide INJECT 1.8MG INTO THE SKIN DAILYAT DINNERTIME FOR DIABETES.   vitamin B-12 1000 MCG tablet Commonly known as: CYANOCOBALAMIN Take 1,000 mcg by mouth daily.   Vitamin D 50 MCG (2000 UT) Caps Take 2,000 Units by mouth daily.        Allergies:  Allergies  Allergen Reactions  . Penicillins Anaphylaxis    Has patient had a PCN reaction causing immediate rash, facial/tongue/throat swelling, SOB or lightheadedness with hypotension: Yes Has patient had a PCN reaction causing severe rash involving mucus membranes or skin necrosis: No Has patient had a PCN reaction that required hospitalization: Yes Has patient had a PCN reaction occurring within the last 10 years: Unknown If all of the above answers are "NO", then may proceed with Cephalosporin use.   Marland Kitchen Plavix [Clopidogrel Bisulfate] Hives  . Pletal [Cilostazol] Itching    Past Medical History:  Diagnosis Date  . Anemia   . Anginal pain ([place]) 03/14/2020   at rest, during the day 4/10 for 1 hour  . Arthritis   . CAD (coronary artery disease)    a.  s/p IMI and BMS 1997;  b. Myoview 4/16:  inf scar with peri-infarct ischemia, EF 34%; high risk;  c. LHC 5/16:  3 v CAD >> CABG (free L-LAD, S-OM, S-dRCA)  . Cancer ([place])    skin cancer and left lung area  . Carotid stenosis    a. carotid US 6/16:  bilat ICA 1-39%  . Chronic kidney disease, stage II (mild)   . Chronic systolic CHF (congestive heart failure) ([place])   . COPD (chronic obstructive pulmonary disease) ([place])   . Dyspnea   . Essential hypertension, benign   . Gout   . HLD (hyperlipidemia)   . Inguinal adenopathy 09/19/2018  . Ischemic cardiomyopathy    a. EF by myoview 4/16 34%; b. LHC 5/16: EF 40-45%;  c. intraop TEE 6/16: EF 45-50%  . Orthostasis   . PAD (peripheral artery disease) ([facility])    Dr. Fletcher Anon  . Pneumonia    hx of  . Type II or unspecified type diabetes mellitus without mention of complication, uncontrolled     Past Surgical History:  Procedure Laterality Date  . ABDOMINAL AORTAGRAM N/A 10/09/2013   Procedure: ABDOMINAL Maxcine Ham;  Surgeon: Wellington Hampshire, MD;  Location: [facility];  Service: Cardiovascular;  Laterality: N/A;  . ABDOMINAL AORTOGRAM N/A 12/28/2016   Procedure: ABDOMINAL  AORTOGRAM;  Surgeon: Waynetta Sandy, MD;  Location: [facility];  Service: Cardiovascular;  Laterality: N/A;  . ABDOMINAL AORTOGRAM W/LOWER EXTREMITY Bilateral 09/11/2018   Procedure: ABDOMINAL AORTOGRAM W/LOWER EXTREMITY;  Surgeon: Serafina Mitchell, MD;  Location: [facility] LAB;  Service: Cardiovascular;  Laterality: Bilateral;  . CARDIAC CATHETERIZATION N/A 10/02/2014   Procedure: Left Heart Cath and Coronary Angiography;  Surgeon: Belva Crome, MD;  Location: [facility];  Service: Cardiovascular;  Laterality: N/A;  . CATARACT EXTRACTION    . CHOLECYSTECTOMY    . COLONOSCOPY    . CORONARY ARTERY BYPASS GRAFT N/A 11/03/2014   Procedure: CORONARY ARTERY BYPASS GRAFTING  times three using left internal mammary and right greater saphenous vein;  Surgeon: Gaye Pollack, MD;  Location: [facility] OR;  Service: Open Heart Surgery;  Laterality: N/A;  . FEMORAL-POPLITEAL BYPASS GRAFT Left 09/18/2018   Procedure: LEFT ILEO FEMORAL ENDARTERECTOMY WITH VEIN PATCH AND ANGIOPLASTY, POPLITEAL  ENDARTERECTOMY, FEM POP BYPASS;  Surgeon: Serafina Mitchell, MD;  Location: [facility] OR;  Service: Vascular;  Laterality: Left;  . HERNIA REPAIR    . IR IMAGING GUIDED PORT INSERTION  03/19/2020  . LOWER EXTREMITY ANGIOGRAPHY Bilateral 12/28/2016   Procedure: Lower Extremity Angiography;  Surgeon: Waynetta Sandy, MD;  Location: [facility] LAB;  Service: Cardiovascular;  Laterality: Bilateral;  . LYMPH NODE DISSECTION Right 09/19/2018   Procedure: SUPERFICIAL RIGHT LYMPH NODE DISSECTION;  Surgeon: Fanny Skates, MD;  Location: [facility];  Service: General;  Laterality: Right;  . MELANOMA EXCISION    . PERIPHERAL VASCULAR BALLOON ANGIOPLASTY Right 12/28/2016   Procedure: PERIPHERAL VASCULAR BALLOON ANGIOPLASTY;  Surgeon: Waynetta Sandy, MD;  Location: [facility] LAB;  Service: Cardiovascular;  Laterality: Right;  SFA UNSUCCESSFUL PTA  UNABLE TO CROSS LESION  . SKIN LESION EXCISION       multiple  . TEE WITHOUT CARDIOVERSION N/A 11/03/2014   Procedure: TRANSESOPHAGEAL ECHOCARDIOGRAM (TEE);  Surgeon: Gaye Pollack, MD;  Location: [facility];  Service: Open Heart Surgery;  Laterality: N/A;  . VIDEO BRONCHOSCOPY WITH ENDOBRONCHIAL NAVIGATION N/A 11/22/2018   Procedure: VIDEO BRONCHOSCOPY WITH ENDOBRONCHIAL NAVIGATION;  Surgeon: [name], MD;  Location: [facility] OR;  Service: Thoracic;  Laterality: N/A;    Family History  Problem Relation Age of Onset  . Colon cancer Father   . Heart disease Mother   . Non-Hodgkin's lymphoma Daughter     Social History:  reports that he quit smoking about 5 years ago. His smoking use included cigarettes. He has a 25.00 pack-year smoking history. He has never used smokeless tobacco. He reports that he does not drink alcohol and does not use drugs.  Review of Systems:   Hypertension:  Currently only taking carvedilol low-doses With starting Invokana his lisinopril has been stopped Blood pressure appears to be somewhat variable  BP Readings from Last 3 Encounters:  04/02/20 130/66  04/01/20 (!) 143/87  03/19/20 (!) 128/54   Renal function is variable   Lab Results  Component Value Date   CREATININE 1.49 (H) 04/01/2020   CREATININE 1.43 03/31/2020   CREATININE 1.50 (H) 03/12/2020     Lipids: Well controlled with atorvastatin 40 mg, LDL below 70, as before HDL is still low     Lab Results  Component Value Date   CHOL 100 06/10/2019   HDL 36.10 (L) 06/10/2019   LDLCALC 40 06/10/2019   TRIG 118.0 06/10/2019   CHOLHDL 3 06/10/2019    His last Pneumovax was in 2020    Examination:   BP 130/66   Pulse 74   Ht 6' 2"  (1.88 m)   Wt 154 lb 3.2 oz (69.9 kg)   SpO2 97%   BMI 19.80 kg/m   Body mass index is 19.8 kg/m.    ASSESSMENT/ PLAN:   Diabetes type 2, nonobese:   See history of present illness for detailed discussion of his current management, blood sugar patterns and problems identified  His A1c is  7.6  He is checking blood sugars only in the morning and discussed need to check readings by rotation at different times to help assess postprandial readings However A1c is adequate for his age and comorbid conditions With Lantus insulin his blood sugars overall have improved significantly even with only 8 units Since he has a tendency to weight loss he will reduce his Victoza to 1.2 mg Resume exercise when able to  Stay on Invokana 100 mg, this is appropriate for his GFR of 48  Renal function: Still impaired but relatively stable  Weight loss: This may have leveled off, no etiology evident and he thinks his appetite has not decreased, thyroid level is okay  Hypertension: This is very mild and controlled with carvedilol only now  Patient Instructions  Victoza 1.84m daily  Lantus 8 units daily  Check blood sugars on waking up 3-4 days a week  Also check blood sugars about 2 hours after meals and do this after different meals by rotation  Recommended blood sugar levels on waking up are 90-130 and about 2 hours after meal is 130-160  Please bring your blood sugar monitor to each visit, thank you      AElayne Snare11/03/2020, 2:18 PM

## 2020-04-02 NOTE — Patient Instructions (Signed)
Victoza 1.2mg  daily  Lantus 8 units daily  Check blood sugars on waking up 3-4 days a week  Also check blood sugars about 2 hours after meals and do this after different meals by rotation  Recommended blood sugar levels on waking up are 90-130 and about 2 hours after meal is 130-160  Please bring your blood sugar monitor to each visit, thank you

## 2020-04-06 ENCOUNTER — Other Ambulatory Visit: Payer: Self-pay

## 2020-04-06 NOTE — Patient Outreach (Signed)
  [facility]) Care Management Chronic Special Needs Program    04/06/2020  Name: Mario [name], DOB: 03-Mar-1941  MRN: [id number]   [name] is enrolled in a chronic special needs plan for [facility] Management will continue to provide services for this member through 05/22/20.  The [facility] care management team will assume care 05/23/2020.   Quinn Plowman RN,BSN,CCM Chronic Care Management Coordinator [facility] [phone]  .

## 2020-04-23 ENCOUNTER — Inpatient Hospital Stay: Payer: HMO

## 2020-04-23 ENCOUNTER — Inpatient Hospital Stay (HOSPITAL_BASED_OUTPATIENT_CLINIC_OR_DEPARTMENT_OTHER): Payer: HMO | Admitting: Oncology

## 2020-04-23 ENCOUNTER — Other Ambulatory Visit: Payer: Self-pay

## 2020-04-23 ENCOUNTER — Inpatient Hospital Stay: Payer: HMO | Attending: Oncology

## 2020-04-23 VITALS — BP 130/98 | HR 73 | Temp 97.5°F | Resp 18 | Ht 74.0 in | Wt 155.0 lb

## 2020-04-23 DIAGNOSIS — Z79899 Other long term (current) drug therapy: Secondary | ICD-10-CM | POA: Insufficient documentation

## 2020-04-23 DIAGNOSIS — C3412 Malignant neoplasm of upper lobe, left bronchus or lung: Secondary | ICD-10-CM | POA: Insufficient documentation

## 2020-04-23 DIAGNOSIS — D649 Anemia, unspecified: Secondary | ICD-10-CM

## 2020-04-23 DIAGNOSIS — Z923 Personal history of irradiation: Secondary | ICD-10-CM | POA: Diagnosis not present

## 2020-04-23 DIAGNOSIS — Z5112 Encounter for antineoplastic immunotherapy: Secondary | ICD-10-CM | POA: Insufficient documentation

## 2020-04-23 DIAGNOSIS — K5730 Diverticulosis of large intestine without perforation or abscess without bleeding: Secondary | ICD-10-CM | POA: Insufficient documentation

## 2020-04-23 DIAGNOSIS — J439 Emphysema, unspecified: Secondary | ICD-10-CM | POA: Diagnosis not present

## 2020-04-23 DIAGNOSIS — C44722 Squamous cell carcinoma of skin of right lower limb, including hip: Secondary | ICD-10-CM

## 2020-04-23 DIAGNOSIS — Z85828 Personal history of other malignant neoplasm of skin: Secondary | ICD-10-CM | POA: Insufficient documentation

## 2020-04-23 DIAGNOSIS — D631 Anemia in chronic kidney disease: Secondary | ICD-10-CM | POA: Diagnosis not present

## 2020-04-23 DIAGNOSIS — I700 Atherosclerosis of aorta: Secondary | ICD-10-CM | POA: Diagnosis not present

## 2020-04-23 DIAGNOSIS — C3490 Malignant neoplasm of unspecified part of unspecified bronchus or lung: Secondary | ICD-10-CM

## 2020-04-23 LAB — CBC WITH DIFFERENTIAL (CANCER CENTER ONLY)
Abs Immature Granulocytes: 0.04 10*3/uL (ref 0.00–0.07)
Basophils Absolute: 0 10*3/uL (ref 0.0–0.1)
Basophils Relative: 1 %
Eosinophils Absolute: 0.2 10*3/uL (ref 0.0–0.5)
Eosinophils Relative: 2 %
HCT: 28 % — ABNORMAL LOW (ref 39.0–52.0)
Hemoglobin: 8.6 g/dL — ABNORMAL LOW (ref 13.0–17.0)
Immature Granulocytes: 1 %
Lymphocytes Relative: 13 %
Lymphs Abs: 0.9 10*3/uL (ref 0.7–4.0)
MCH: 24.1 pg — ABNORMAL LOW (ref 26.0–34.0)
MCHC: 30.7 g/dL (ref 30.0–36.0)
MCV: 78.4 fL — ABNORMAL LOW (ref 80.0–100.0)
Monocytes Absolute: 0.6 10*3/uL (ref 0.1–1.0)
Monocytes Relative: 8 %
Neutro Abs: 5.4 10*3/uL (ref 1.7–7.7)
Neutrophils Relative %: 75 %
Platelet Count: 268 10*3/uL (ref 150–400)
RBC: 3.57 MIL/uL — ABNORMAL LOW (ref 4.22–5.81)
RDW: 16.1 % — ABNORMAL HIGH (ref 11.5–15.5)
WBC Count: 7.1 10*3/uL (ref 4.0–10.5)
nRBC: 0 % (ref 0.0–0.2)

## 2020-04-23 LAB — CMP (CANCER CENTER ONLY)
ALT: 21 U/L (ref 0–44)
AST: 13 U/L — ABNORMAL LOW (ref 15–41)
Albumin: 2.8 g/dL — ABNORMAL LOW (ref 3.5–5.0)
Alkaline Phosphatase: 75 U/L (ref 38–126)
Anion gap: 10 (ref 5–15)
BUN: 24 mg/dL — ABNORMAL HIGH (ref 8–23)
CO2: 19 mmol/L — ABNORMAL LOW (ref 22–32)
Calcium: 9.2 mg/dL (ref 8.9–10.3)
Chloride: 107 mmol/L (ref 98–111)
Creatinine: 1.47 mg/dL — ABNORMAL HIGH (ref 0.61–1.24)
GFR, Estimated: 48 mL/min — ABNORMAL LOW (ref >60)
Glucose, Bld: 213 mg/dL — ABNORMAL HIGH (ref 70–99)
Potassium: 4 mmol/L (ref 3.5–5.1)
Sodium: 136 mmol/L (ref 135–145)
Total Bilirubin: 0.5 mg/dL (ref 0.3–1.2)
Total Protein: 6.5 g/dL (ref 6.5–8.1)

## 2020-04-23 LAB — TSH: TSH: 2.529 u[IU]/mL (ref 0.320–4.118)

## 2020-04-23 MED ORDER — SODIUM CHLORIDE 0.9% FLUSH
10.0000 mL | INTRAVENOUS | Status: DC | PRN
  Administered 2020-04-23: 10 mL
  Filled 2020-04-23: qty 10

## 2020-04-23 MED ORDER — HEPARIN SOD (PORK) LOCK FLUSH 100 UNIT/ML IV SOLN
500.0000 [IU] | Freq: Once | INTRAVENOUS | Status: AC | PRN
  Administered 2020-04-23: 500 [IU]
  Filled 2020-04-23: qty 5

## 2020-04-23 MED ORDER — SODIUM CHLORIDE 0.9 % IV SOLN
200.0000 mg | Freq: Once | INTRAVENOUS | Status: AC
  Administered 2020-04-23: 200 mg via INTRAVENOUS
  Filled 2020-04-23: qty 8

## 2020-04-23 MED ORDER — SODIUM CHLORIDE 0.9 % IV SOLN
Freq: Once | INTRAVENOUS | Status: AC
  Filled 2020-04-23: qty 250

## 2020-04-23 NOTE — Progress Notes (Signed)
Hematology and Oncology Follow Up   Mario [name] [id number] 15-Oct-1940 79 y.o. 04/23/2020 11:39 AM Polite, [name], MDPolite, [name], MD       Principle Diagnosis: 79 year old man with squamous cell carcinoma of the lung  diagnosed in February 2020. He developed relapsed disease with mediastinal adenopathy in September 2021.  He was found to have stage IIb at the time of diagnosis..   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      Prior Therapy: Status post surgical excision of a right knee skin lesion.  Pathology showed squamous cell carcinoma in December 2019.  He is status post superficial inguinal lymph node dissection completed by Dr. Dalbert Batman on September 19, 2018.  He is status post radiation therapy to the left lower lobe of the lung between July 15 and December 14, 2018.  He received 60 Gray in 5 fractions.  He developed relapsed disease in September 2021 with local recurrence in the left upper lobe as well as left sided mediastinal adenopathy.   Current therapy: Pembrolizumab 200 mg every 3 weeks started on February 21, 2020.  He is here for cycle 4 of therapy.  Interim History: [name] returns today for repeat follow-up.  Since the last visit, he reports no major changes in his health.  He continues to tolerate current therapy without any recent complaints.  He denies any nausea, vomiting or fatigue.  He denies any shortness of breath or difficulty breathing.  He denies any cough or hemoptysis.  He continues to be active and attends to activities of daily living.  He is able to drive without any difficulties.   Medications: Unchanged on review. Current Outpatient Medications  Medication Sig Dispense Refill  . allopurinol (ZYLOPRIM) 100 MG tablet Take 1 tablet by mouth daily (Patient taking differently: Take 100 mg by mouth at bedtime. ) 90 tablet 0  . ARTIFICIAL TEAR OP Apply 1 drop to eye  daily as needed (dry eyes).    Marland Kitchen aspirin EC 81 MG tablet Take 81 mg by mouth daily.    Marland Kitchen atorvastatin (LIPITOR) 40 MG tablet take 1 tablet by mouth daily at 6:00pm  (Patient taking differently: Take 40 mg by mouth at bedtime. Takes every other day) 90 tablet 0  . Blood Glucose Monitoring Suppl (FREESTYLE FREEDOM LITE) w/Device KIT USE TO check blood sugar TWICE DAILY    . Blood Glucose Monitoring Suppl (FREESTYLE LITE) DEVI USE TO TEST BLOOD SUGAR TWICE DAILY. DX:E11.65 1 each 1  . calcium carbonate (TUMS - DOSED IN MG ELEMENTAL CALCIUM) 500 MG chewable tablet Chew 1 tablet by mouth daily as needed for indigestion or heartburn.    . carvedilol (COREG) 3.125 MG tablet TAKE 1 TABLET BY MOUTH TWICE A DAY (Patient taking differently: Take 3.125 mg by mouth 2 (two) times daily with a meal. ) 180 tablet 3  . Cholecalciferol (VITAMIN D) 2000 units CAPS Take 2,000 Units by mouth daily.    . fenofibrate micronized (ANTARA) 43 MG capsule Take 43 mg by mouth every evening.    . ferrous sulfate 325 (65 FE) MG tablet Take 325 mg by mouth daily.     . finasteride (PROSCAR) 5 MG tablet Take 5 mg by mouth every evening.   3  . folic acid (FOLVITE) 865 MCG tablet Take 800 mcg by mouth daily.     Marland Kitchen glipiZIDE (GLUCOTROL XL) 10 MG 24 hr tablet SMARTSIG:1 Tablet(s) By Mouth Every Evening    .  glucose blood (FREESTYLE LITE) test strip Use as instructed to check blood sugar 2 times per day dx code E11.65 100 each 3  . insulin glargine (LANTUS SOLOSTAR) 100 UNIT/ML Solostar Pen Inject 10 Units into the skin daily. (Patient taking differently: Inject 8 Units into the skin daily. ) 5 pen 1  . Insulin Pen Needle 31G X 5 MM MISC Use once a day to inject Lantus 50 each 1  . INVOKANA 100 MG TABS tablet TAKE ONE TABLET BY MOUTH ONCE DAILY BEFORE BREAKFAST 30 tablet 3  . Lancets (FREESTYLE) lancets Use as instructed to check blood sugar 2 times per day dx code E11.65 100 each 3  . lidocaine-prilocaine (EMLA) cream Apply 1  application topically as needed. 30 g 0  . metFORMIN (GLUCOPHAGE) 1000 MG tablet TAKE 1 TABLET BY MOUTH TWICE DAILY FOR BREAKFAST AND DINNER FOR DIABETES. (Patient taking differently: Take 1,000 mg by mouth 2 (two) times daily with a meal. ) 60 tablet 2  . miglitol (GLYSET) 50 MG tablet Take 1 tablet (50 mg total) by mouth daily with supper. 30 tablet 3  . Multiple Vitamins-Minerals (EQ VISION FORMULA 50+) CAPS Take 1 capsule by mouth daily.    . prochlorperazine (COMPAZINE) 10 MG tablet Take 1 tablet (10 mg total) by mouth every 6 (six) hours as needed for nausea or vomiting. 30 tablet 0  . tamsulosin (FLOMAX) 0.4 MG CAPS capsule Take 0.4 mg by mouth 2 (two) times daily.    Marland Kitchen Ubiquinol (QUNOL COQ10/UBIQUINOL/MEGA) 100 MG CAPS Take 1 capsule by mouth daily.     Marland Kitchen VICTOZA 18 MG/3ML SOPN INJECT 1.8MG INTO THE SKIN DAILYAT DINNERTIME FOR DIABETES. 9 mL 3  . vitamin B-12 (CYANOCOBALAMIN) 1000 MCG tablet Take 1,000 mcg by mouth daily.     No current facility-administered medications for this visit.     Allergies:  Allergies  Allergen Reactions  . Penicillins Anaphylaxis    Has patient had a PCN reaction causing immediate rash, facial/tongue/throat swelling, SOB or lightheadedness with hypotension: Yes Has patient had a PCN reaction causing severe rash involving mucus membranes or skin necrosis: No Has patient had a PCN reaction that required hospitalization: Yes Has patient had a PCN reaction occurring within the last 10 years: Unknown If all of the above answers are "NO", then may proceed with Cephalosporin use.   Marland Kitchen Plavix [Clopidogrel Bisulfate] Hives  . Pletal [Cilostazol] Itching    Physical exam Blood pressure (!) 130/98, pulse 73, temperature (!) 97.5 F (36.4 C), temperature source Tympanic, resp. rate 18, height 6' 2"  (1.88 m), weight 155 lb (70.3 kg), SpO2 100 %.   ECOG 1    General appearance: Comfortable appearing without any discomfort Head: Normocephalic without any  trauma Oropharynx: Mucous membranes are moist and pink without any thrush or ulcers. Eyes: Pupils are equal and round reactive to light. Lymph nodes: No cervical, supraclavicular, inguinal or axillary lymphadenopathy.   Heart:regular rate and rhythm.  S1 and S2 without leg edema. Lung: Clear without any rhonchi or wheezes.  No dullness to percussion. Abdomin: Soft, nontender, nondistended with good bowel sounds.  No hepatosplenomegaly. Musculoskeletal: No joint deformity or effusion.  Full range of motion noted. Neurological: No deficits noted on motor, sensory and deep tendon reflex exam. Skin: No petechial rash or dryness.  Appeared moist.  Psychiatric: Mood and affect appeared appropriate.     Lab Results: Lab Results  Component Value Date   WBC 7.1 04/23/2020   HGB 8.6 (L) 04/23/2020  HCT 28.0 (L) 04/23/2020   MCV 78.4 (L) 04/23/2020   PLT 268 04/23/2020     Chemistry      Component Value Date/Time   NA 138 04/01/2020 0905   K 3.7 04/01/2020 0905   CL 110 04/01/2020 0905   CO2 20 (L) 04/01/2020 0905   BUN 29 (H) 04/01/2020 0905   CREATININE 1.49 (H) 04/01/2020 0905   CREATININE 1.14 08/13/2013 1043      Component Value Date/Time   CALCIUM 9.4 04/01/2020 0905   ALKPHOS 73 04/01/2020 0905   AST 21 04/01/2020 0905   ALT 29 04/01/2020 0905   BILITOT 0.6 04/01/2020 0905          Impression and Plan:  79 year old man with:    1.  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  He has pulmonary nodules and mediastinal lymph node involvement.  The natural course of this disease was reviewed and treatment options were reiterated.  He is currently on Pembrolizumab which she has tolerated very well and the plan is to update his staging before the next cycle of therapy.  If he has reasonable disease response, continuing this therapy will be recommended.  Alternative treatment options would be systemic chemotherapy utilizing a carboplatin  doublet.   2.    Squamous cell carcinoma of the skin.  No recent exacerbation noted.   3.  Antiemetics: No nausea or vomiting reported with Compazine is available to him.   4.  Immune mediated complications: Complications including hepatitis, pneumonitis and thyroid disease were reviewed.  He is not experiencing any at this time.  5.  IV access: Port-A-Cath currently in use without any issues.  6.  Anemia: Unclear etiology with the microcytosis and elevated RDW.  We will update iron studies and replace as needed.  7.  Follow-up: In 3 weeks for the next cycle of therapy.    30  minutes were spent on this encounter.  The time was dedicated to reviewing his disease status, reviewing treatment options and addressing complications related to his cancer and cancer therapy.   [name], MD 04/23/2020 11:39 AM

## 2020-04-23 NOTE — Patient Instructions (Signed)
[facility] Discharge Instructions for Patients Receiving Chemotherapy  Today you received the following chemotherapy agents: pembrolizumab Beryle Flock).   To help prevent nausea and vomiting after your treatment, we encourage you to take your nausea medication as directed.   If you develop nausea and vomiting that is not controlled by your nausea medication, call the clinic.   BELOW ARE SYMPTOMS THAT SHOULD BE REPORTED IMMEDIATELY:  *FEVER GREATER THAN 100.5 F  *CHILLS WITH OR WITHOUT FEVER  NAUSEA AND VOMITING THAT IS NOT CONTROLLED WITH YOUR NAUSEA MEDICATION  *UNUSUAL SHORTNESS OF BREATH  *UNUSUAL BRUISING OR BLEEDING  TENDERNESS IN MOUTH AND THROAT WITH OR WITHOUT PRESENCE OF ULCERS  *URINARY PROBLEMS  *BOWEL PROBLEMS  UNUSUAL RASH Items with * indicate a potential emergency and should be followed up as soon as possible.  Feel free to call the clinic should you have any questions or concerns. The clinic phone number is [phone].  Please show the [facility] at check-in to the Emergency Department and triage nurse.

## 2020-04-23 NOTE — Progress Notes (Signed)
Patient discharged in stable condition.

## 2020-04-29 ENCOUNTER — Telehealth: Payer: Self-pay | Admitting: *Deleted

## 2020-04-29 ENCOUNTER — Telehealth: Payer: Self-pay

## 2020-04-29 ENCOUNTER — Ambulatory Visit: Payer: HMO | Admitting: Podiatry

## 2020-04-29 NOTE — Telephone Encounter (Signed)
I attempted to contact patient to discuss-  He did not answer.  Will try again

## 2020-04-29 NOTE — Telephone Encounter (Signed)
Patient called stating he can't use the old meter or the new meter due to not having the measuring sticks. Patient is requesting a meter. [phone]  Patient states he will buy one if he needs too, just his [facility] does not have any

## 2020-04-29 NOTE — Telephone Encounter (Signed)
The message is not clear.  Does the pharmacy not have the freestyle test strips in stock?  If so he can be sent a One Touch Verio meter and testing frequency twice a day

## 2020-04-29 NOTE — Telephone Encounter (Signed)
-----   Message from Wyatt Portela, MD sent at 04/29/2020 10:10 AM EST ----- The confusion is likely unrelated to [facility].  He does not require iron I will address her concern next visit.  Thanks ----- Message ----- From: Tami Lin, RN Sent: 04/29/2020   9:40 AM EST To: Wyatt Portela, MD  Patient's wife called and wanted to make you aware that patient has experienced some recent confusion and fatigue. She states patient is diabetic and recently lost it meter but got it back yesterday. Patient has had 2 episodes of confusion- one in the middle of the night and one while watching a movie. Patient's wife thinks this may be due to [place]. I explained to her that confusion is a possible but not common side effect and that confusion when waking up from sleep is common for most people.  Last Keytruda infusion was 04/23/20 and next appt is 12/22. PET scan is scheduled for 12/17. I told her I would make you aware of her concerns as requested. She also wants to know if her husband should be taking an iron supplement Lanelle Bal

## 2020-04-29 NOTE — Telephone Encounter (Signed)
Called patient's wife and let her know Dr. Hazeline Junker response below. She verbalized understanding.

## 2020-04-29 NOTE — Telephone Encounter (Signed)
Do you have a preference of meter?  Thanks!

## 2020-04-30 ENCOUNTER — Other Ambulatory Visit: Payer: Self-pay

## 2020-04-30 DIAGNOSIS — E1165 Type 2 diabetes mellitus with hyperglycemia: Secondary | ICD-10-CM

## 2020-04-30 MED ORDER — GLUCOSE BLOOD VI STRP: ORAL_STRIP | 12 refills | Status: AC

## 2020-04-30 NOTE — Telephone Encounter (Signed)
Patient daughter came in picked up One Touch Verio Reflect.  Please send RX for test strips to [facility]

## 2020-04-30 NOTE — Telephone Encounter (Signed)
Called and spoke with patient and he does not have a meter or test strips at this time. He was sold a meter without test strips in stock to purchase and does not have additional money to purchase another meter and additional strips. Patient advised to come into the office and get a sample meter and test strips, a prescription will be sent to preferred pharmacy for test strips. Patient advised his daughter [place] will be able to pick up the meter and strips either today or tomorrow.

## 2020-04-30 NOTE — Telephone Encounter (Signed)
Patient returned Julie's call and requests to be called at ph# [phone]. Patient states if Dr. Dwyane Dee can help patient get a testing device his daughter can pick it up. Patient states he is undergoing cancer treatments and must have accurate blood sugar readings.

## 2020-05-01 ENCOUNTER — Telehealth: Payer: Self-pay | Admitting: Endocrinology

## 2020-05-01 ENCOUNTER — Ambulatory Visit: Payer: HMO

## 2020-05-01 ENCOUNTER — Other Ambulatory Visit: Payer: Self-pay

## 2020-05-01 DIAGNOSIS — E1165 Type 2 diabetes mellitus with hyperglycemia: Secondary | ICD-10-CM

## 2020-05-01 MED ORDER — GLUCOSE BLOOD VI STRP: ORAL_STRIP | 2 refills | Status: AC

## 2020-05-01 NOTE — Progress Notes (Signed)
Patient came into the office to be educated on how to use the [employer]. Patient previously had Freestyle device and was confused how to set up and use the OneTouch. After explaining the directions patient correctly demonstrated how to use lancets and new device to check blood glucose.

## 2020-05-01 NOTE — Telephone Encounter (Signed)
Outbound call to patient explaining how to turn on and use the meter. Patient was having issues with the lancets. Advised to use the lancets he was familiar with. Patient may call back if unable to get meter working.

## 2020-05-01 NOTE — Addendum Note (Signed)
Addended by: Jefferson Fuel on: 05/01/2020 11:09 AM   Modules accepted: Orders

## 2020-05-01 NOTE — Telephone Encounter (Signed)
Patient called back about meter.  Asked to speak with Tileshia since she had been working with him.

## 2020-05-01 NOTE — Telephone Encounter (Signed)
Was able to get Mr Mario [name] on the Nurses schedule 05/01/2020 to be properly educated on the new meter.

## 2020-05-01 NOTE — Telephone Encounter (Signed)
Test strips sent to pharmacy.

## 2020-05-01 NOTE — Telephone Encounter (Signed)
Patient called requesting help with his meter. He is unsure how to use it    Please advise [phone]

## 2020-05-02 ENCOUNTER — Other Ambulatory Visit: Payer: Self-pay | Admitting: Endocrinology

## 2020-05-08 ENCOUNTER — Other Ambulatory Visit: Payer: Self-pay

## 2020-05-08 ENCOUNTER — Ambulatory Visit (HOSPITAL_COMMUNITY)
Admission: RE | Admit: 2020-05-08 | Discharge: 2020-05-08 | Disposition: A | Discharge status: Home / Self Care | Payer: HMO | Source: Ambulatory Visit | Attending: Oncology | Admitting: Oncology

## 2020-05-08 DIAGNOSIS — I700 Atherosclerosis of aorta: Secondary | ICD-10-CM | POA: Insufficient documentation

## 2020-05-08 DIAGNOSIS — J439 Emphysema, unspecified: Secondary | ICD-10-CM | POA: Diagnosis not present

## 2020-05-08 DIAGNOSIS — C3490 Malignant neoplasm of unspecified part of unspecified bronchus or lung: Secondary | ICD-10-CM | POA: Diagnosis not present

## 2020-05-08 DIAGNOSIS — I2510 Atherosclerotic heart disease of native coronary artery without angina pectoris: Secondary | ICD-10-CM | POA: Insufficient documentation

## 2020-05-08 DIAGNOSIS — Z85828 Personal history of other malignant neoplasm of skin: Secondary | ICD-10-CM | POA: Diagnosis not present

## 2020-05-08 DIAGNOSIS — K5730 Diverticulosis of large intestine without perforation or abscess without bleeding: Secondary | ICD-10-CM | POA: Insufficient documentation

## 2020-05-08 DIAGNOSIS — C770 Secondary and unspecified malignant neoplasm of lymph nodes of head, face and neck: Secondary | ICD-10-CM | POA: Diagnosis not present

## 2020-05-08 DIAGNOSIS — N433 Hydrocele, unspecified: Secondary | ICD-10-CM | POA: Diagnosis not present

## 2020-05-08 DIAGNOSIS — J90 Pleural effusion, not elsewhere classified: Secondary | ICD-10-CM | POA: Insufficient documentation

## 2020-05-08 DIAGNOSIS — I77810 Thoracic aortic ectasia: Secondary | ICD-10-CM | POA: Diagnosis not present

## 2020-05-08 LAB — GLUCOSE, CAPILLARY: Glucose-Capillary: 123 mg/dL — ABNORMAL HIGH (ref 70–99)

## 2020-05-08 MED ORDER — FLUDEOXYGLUCOSE F - 18 (FDG) INJECTION
7.7200 | Freq: Once | INTRAVENOUS | Status: AC | PRN
  Administered 2020-05-08: 7.72 via INTRAVENOUS

## 2020-05-12 ENCOUNTER — Encounter: Payer: Self-pay | Admitting: Podiatry

## 2020-05-12 ENCOUNTER — Ambulatory Visit: Payer: HMO | Admitting: Podiatry

## 2020-05-12 ENCOUNTER — Other Ambulatory Visit: Payer: Self-pay

## 2020-05-12 DIAGNOSIS — B351 Tinea unguium: Secondary | ICD-10-CM

## 2020-05-12 DIAGNOSIS — E1142 Type 2 diabetes mellitus with diabetic polyneuropathy: Secondary | ICD-10-CM | POA: Diagnosis not present

## 2020-05-12 DIAGNOSIS — M79675 Pain in left toe(s): Secondary | ICD-10-CM | POA: Diagnosis not present

## 2020-05-12 DIAGNOSIS — I739 Peripheral vascular disease, unspecified: Secondary | ICD-10-CM | POA: Diagnosis not present

## 2020-05-12 DIAGNOSIS — M79674 Pain in right toe(s): Secondary | ICD-10-CM | POA: Diagnosis not present

## 2020-05-12 NOTE — Progress Notes (Signed)
This patient returns to my office for at risk foot care.  This patient requires this care by a professional since this patient will be at risk due to having CKD, PAD and diabetic neuropathy.  This patient is unable to cut nails himself since the patient cannot reach his nails.These nails are painful walking and wearing shoes.  He presents to the office with his wife.  This patient presents for at risk foot care today.  General Appearance  Alert, conversant and in no acute stress.  Vascular  Dorsalis pedis and posterior tibial  pulses are palpable  bilaterally.  Capillary return is within normal limits  bilaterally. Temperature is within normal limits  bilaterally.  Neurologic  Senn-Weinstein monofilament wire test within normal limits  bilaterally. Muscle power within normal limits bilaterally.  Nails Thick disfigured discolored nails with subungual debris  from hallux to fifth toes bilaterally. No evidence of bacterial infection or drainage bilaterally.  Orthopedic  No limitations of motion  feet .  No crepitus or effusions noted.  No bony pathology or digital deformities noted.  Skin  normotropic skin with no porokeratosis noted bilaterally.  No signs of infections or ulcers noted.     Onychomycosis  Pain in right toes  Pain in left toes  Consent was obtained for treatment procedures.   Mechanical debridement of nails 1-5  bilaterally performed with a nail nipper.  Filed with dremel without incident.    Return office visit                     Told patient to return for periodic foot care and evaluation due to potential at risk complications.   Gardiner Barefoot DPM

## 2020-05-13 ENCOUNTER — Inpatient Hospital Stay: Payer: HMO | Admitting: Oncology

## 2020-05-13 ENCOUNTER — Inpatient Hospital Stay: Payer: HMO

## 2020-05-13 ENCOUNTER — Other Ambulatory Visit: Payer: Self-pay

## 2020-05-13 VITALS — BP 133/64 | HR 74 | Temp 97.7°F | Resp 18 | Ht 74.0 in

## 2020-05-13 DIAGNOSIS — C44722 Squamous cell carcinoma of skin of right lower limb, including hip: Secondary | ICD-10-CM | POA: Diagnosis not present

## 2020-05-13 DIAGNOSIS — D649 Anemia, unspecified: Secondary | ICD-10-CM

## 2020-05-13 DIAGNOSIS — Z95828 Presence of other vascular implants and grafts: Secondary | ICD-10-CM

## 2020-05-13 DIAGNOSIS — C3490 Malignant neoplasm of unspecified part of unspecified bronchus or lung: Secondary | ICD-10-CM

## 2020-05-13 DIAGNOSIS — Z5112 Encounter for antineoplastic immunotherapy: Secondary | ICD-10-CM | POA: Diagnosis not present

## 2020-05-13 LAB — CBC WITH DIFFERENTIAL (CANCER CENTER ONLY)
Abs Immature Granulocytes: 0.02 10*3/uL (ref 0.00–0.07)
Basophils Absolute: 0 10*3/uL (ref 0.0–0.1)
Basophils Relative: 1 %
Eosinophils Absolute: 0.1 10*3/uL (ref 0.0–0.5)
Eosinophils Relative: 2 %
HCT: 27.4 % — ABNORMAL LOW (ref 39.0–52.0)
Hemoglobin: 8.3 g/dL — ABNORMAL LOW (ref 13.0–17.0)
Immature Granulocytes: 0 %
Lymphocytes Relative: 13 %
Lymphs Abs: 0.9 10*3/uL (ref 0.7–4.0)
MCH: 23.8 pg — ABNORMAL LOW (ref 26.0–34.0)
MCHC: 30.3 g/dL (ref 30.0–36.0)
MCV: 78.5 fL — ABNORMAL LOW (ref 80.0–100.0)
Monocytes Absolute: 0.6 10*3/uL (ref 0.1–1.0)
Monocytes Relative: 9 %
Neutro Abs: 5.2 10*3/uL (ref 1.7–7.7)
Neutrophils Relative %: 75 %
Platelet Count: 277 10*3/uL (ref 150–400)
RBC: 3.49 MIL/uL — ABNORMAL LOW (ref 4.22–5.81)
RDW: 16 % — ABNORMAL HIGH (ref 11.5–15.5)
WBC Count: 6.8 10*3/uL (ref 4.0–10.5)
nRBC: 0 % (ref 0.0–0.2)

## 2020-05-13 LAB — CMP (CANCER CENTER ONLY)
ALT: 21 U/L (ref 0–44)
AST: 21 U/L (ref 15–41)
Albumin: 2.6 g/dL — ABNORMAL LOW (ref 3.5–5.0)
Alkaline Phosphatase: 75 U/L (ref 38–126)
Anion gap: 11 (ref 5–15)
BUN: 25 mg/dL — ABNORMAL HIGH (ref 8–23)
CO2: 19 mmol/L — ABNORMAL LOW (ref 22–32)
Calcium: 9.1 mg/dL (ref 8.9–10.3)
Chloride: 107 mmol/L (ref 98–111)
Creatinine: 1.37 mg/dL — ABNORMAL HIGH (ref 0.61–1.24)
GFR, Estimated: 52 mL/min — ABNORMAL LOW (ref >60)
Glucose, Bld: 118 mg/dL — ABNORMAL HIGH (ref 70–99)
Potassium: 3.9 mmol/L (ref 3.5–5.1)
Sodium: 137 mmol/L (ref 135–145)
Total Bilirubin: 0.5 mg/dL (ref 0.3–1.2)
Total Protein: 6.3 g/dL — ABNORMAL LOW (ref 6.5–8.1)

## 2020-05-13 LAB — IRON AND TIBC
Iron: 13 ug/dL — ABNORMAL LOW (ref 42–163)
Saturation Ratios: 6 % — ABNORMAL LOW (ref 20–55)
TIBC: 220 ug/dL (ref 202–409)
UIBC: 208 ug/dL (ref 117–376)

## 2020-05-13 LAB — FERRITIN: Ferritin: 351 ng/mL — ABNORMAL HIGH (ref 24–336)

## 2020-05-13 LAB — TSH: TSH: 2.682 u[IU]/mL (ref 0.320–4.118)

## 2020-05-13 MED ORDER — SODIUM CHLORIDE 0.9% FLUSH
10.0000 mL | Freq: Once | INTRAVENOUS | Status: AC
  Administered 2020-05-13: 10 mL via INTRAVENOUS
  Filled 2020-05-13: qty 10

## 2020-05-13 MED ORDER — HEPARIN SOD (PORK) LOCK FLUSH 100 UNIT/ML IV SOLN
500.0000 [IU] | Freq: Once | INTRAVENOUS | Status: AC
  Administered 2020-05-13: 500 [IU] via INTRAVENOUS
  Filled 2020-05-13: qty 5

## 2020-05-13 NOTE — Progress Notes (Signed)
Hematology and Oncology Follow Up   EBER FERRUFINO [id number] 1940-11-17 79 y.o. 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 He was initially diagnosed in February 2020 with stage IIb disease.   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      Prior Therapy: Status post surgical excision of a right knee skin lesion.  Pathology showed squamous cell carcinoma in December 2019.  He is status post superficial inguinal lymph node dissection completed by Dr. Dalbert Batman on September 19, 2018.  He is status post radiation therapy to the left lower lobe of the lung between July 15 and December 14, 2018.  He received 60 Gray in 5 fractions.  He developed relapsed disease in September 2021 with local recurrence in the left upper lobe as well as left sided mediastinal adenopathy.   Current therapy: Pembrolizumab 200 mg every 3 weeks started on February 21, 2020.  He is here for cycle 5 of therapy.  Interim History: Mr. Rideout presents today for repeat evaluation. Since the last visit, he reports overall decline in his health and performance status.  His wife is reporting more memory and periodic confusion issues.  He is not reporting any increased pain or discomfort.  He has reported some memory issues and difficulty remembering places.  His appetite has been about the same and denies any increased pain or shortness of breath.  His ambulation has been limited however.   Medications: Reviewed without changes. Current Outpatient Medications  Medication Sig Dispense Refill  . allopurinol (ZYLOPRIM) 100 MG tablet Take 1 tablet by mouth daily (Patient taking differently: Take 100 mg by mouth at bedtime. ) 90 tablet 0  . ARTIFICIAL TEAR OP Apply 1 drop to eye daily as needed (dry eyes).    Marland Kitchen aspirin  EC 81 MG tablet Take 81 mg by mouth daily.    Marland Kitchen atorvastatin (LIPITOR) 40 MG tablet take 1 tablet by mouth daily at 6:00pm  (Patient taking differently: Take 40 mg by mouth at bedtime. Takes every other day) 90 tablet 0  . Blood Glucose Monitoring Suppl (FREESTYLE FREEDOM LITE) w/Device KIT USE TO check blood sugar TWICE DAILY    . Blood Glucose Monitoring Suppl (FREESTYLE LITE) DEVI USE TO TEST BLOOD SUGAR TWICE DAILY. DX:E11.65 1 each 1  . calcium carbonate (TUMS - DOSED IN MG ELEMENTAL CALCIUM) 500 MG chewable tablet Chew 1 tablet by mouth daily as needed for indigestion or heartburn.    . carvedilol (COREG) 3.125 MG tablet TAKE 1 TABLET BY MOUTH TWICE A DAY (Patient taking differently: Take 3.125 mg by mouth 2 (two) times daily with a meal. ) 180 tablet 3  . Cholecalciferol (VITAMIN D) 2000 units CAPS Take 2,000 Units by mouth daily.    . fenofibrate micronized (ANTARA) 43 MG capsule Take 43 mg by mouth every evening.    . ferrous sulfate 325 (65 FE) MG tablet Take 325 mg by mouth daily.     . finasteride (PROSCAR) 5 MG tablet Take 5 mg by mouth every evening.   3  . folic acid (FOLVITE) 494 MCG tablet Take 800 mcg by mouth daily.     Marland Kitchen glipiZIDE (GLUCOTROL XL) 10 MG 24 hr tablet SMARTSIG:1 Tablet(s) By Mouth Every Evening    . glucose blood (FREESTYLE LITE) test strip Use as instructed  to check blood sugar 2 times per day dx code E11.65 100 each 3  . glucose blood test strip Use as instructed 100 each 12  . glucose blood test strip Check blood sugar once daily 100 each 2  . insulin glargine (LANTUS SOLOSTAR) 100 UNIT/ML Solostar Pen Inject 10 Units into the skin daily. (Patient taking differently: Inject 8 Units into the skin daily. ) 5 pen 1  . Insulin Pen Needle 31G X 5 MM MISC Use once a day to inject Lantus 50 each 1  . INVOKANA 100 MG TABS tablet TAKE ONE TABLET BY MOUTH BEFORE BREAKFAST 30 tablet 3  . Lancets (FREESTYLE) lancets Use as instructed to check blood sugar 2 times per day dx  code E11.65 100 each 3  . lidocaine-prilocaine (EMLA) cream Apply 1 application topically as needed. 30 g 0  . metFORMIN (GLUCOPHAGE) 1000 MG tablet TAKE 1 TABLET BY MOUTH TWICE DAILY FOR BREAKFAST AND DINNER FOR DIABETES. (Patient taking differently: Take 1,000 mg by mouth 2 (two) times daily with a meal. ) 60 tablet 2  . miglitol (GLYSET) 50 MG tablet TAKE ONE TABLET BY MOUTH ONCE DAILY WITH SUPPER 30 tablet 3  . Multiple Vitamins-Minerals (EQ VISION FORMULA 50+) CAPS Take 1 capsule by mouth daily.    . prochlorperazine (COMPAZINE) 10 MG tablet Take 1 tablet (10 mg total) by mouth every 6 (six) hours as needed for nausea or vomiting. 30 tablet 0  . tamsulosin (FLOMAX) 0.4 MG CAPS capsule Take 0.4 mg by mouth 2 (two) times daily.    Marland Kitchen Ubiquinol (QUNOL COQ10/UBIQUINOL/MEGA) 100 MG CAPS Take 1 capsule by mouth daily.     Marland Kitchen VICTOZA 18 MG/3ML SOPN INJECT 1.8MG INTO THE SKIN DAILYAT DINNERTIME FOR DIABETES. 9 mL 3  . vitamin B-12 (CYANOCOBALAMIN) 1000 MCG tablet Take 1,000 mcg by mouth daily.     No current facility-administered medications for this visit.     Allergies:  Allergies  Allergen Reactions  . Penicillins Anaphylaxis    Has patient had a PCN reaction causing immediate rash, facial/tongue/throat swelling, SOB or lightheadedness with hypotension: Yes Has patient had a PCN reaction causing severe rash involving mucus membranes or skin necrosis: No Has patient had a PCN reaction that required hospitalization: Yes Has patient had a PCN reaction occurring within the last 10 years: Unknown If all of the above answers are "NO", then may proceed with Cephalosporin use.   Marland Kitchen Plavix [Clopidogrel Bisulfate] Hives  . Other Other (See Comments)  . Penicillin G Other (See Comments)  . Pletal [Cilostazol] Itching    Physical exam Blood pressure 133/64, pulse 74, temperature 97.7 F (36.5 C), temperature source Tympanic, resp. rate 18, height 6' 2"  (1.88 m), SpO2 100 %.    ECOG  2   General appearance: Alert, awake without any distress. Head: Atraumatic without abnormalities Oropharynx: Without any thrush or ulcers. Eyes: No scleral icterus. Lymph nodes: No lymphadenopathy noted in the cervical, supraclavicular, or axillary nodes Heart:regular rate and rhythm, without any murmurs or gallops.   Lung: Clear to auscultation without any rhonchi, wheezes or dullness to percussion. Abdomin: Soft, nontender without any shifting dullness or ascites. Musculoskeletal: No clubbing or cyanosis. Neurological: No motor or sensory deficits. Skin: No rashes or lesions.     Lab Results: Lab Results  Component Value Date   WBC 7.1 04/23/2020   HGB 8.6 (L) 04/23/2020   HCT 28.0 (L) 04/23/2020   MCV 78.4 (L) 04/23/2020   PLT 268 04/23/2020  Chemistry      Component Value Date/Time   NA 136 04/23/2020 1120   K 4.0 04/23/2020 1120   CL 107 04/23/2020 1120   CO2 19 (L) 04/23/2020 1120   BUN 24 (H) 04/23/2020 1120   CREATININE 1.47 (H) 04/23/2020 1120   CREATININE 1.14 08/13/2013 1043      Component Value Date/Time   CALCIUM 9.2 04/23/2020 1120   ALKPHOS 75 04/23/2020 1120   AST 13 (L) 04/23/2020 1120   ALT 21 04/23/2020 1120   BILITOT 0.5 04/23/2020 1120       IMPRESSION: 1. Intensely hypermetabolic 8.0 cm anteromedial left upper lobe lung mass, increased in size and metabolism. 2. New hypermetabolic right paratracheal and supraclavicular nodal metastases. Left paratracheal hypermetabolic nodal metastasis is increased in metabolism. 3. Left hilar nodal metastases are decreased in metabolism. 4. No hypermetabolic metastatic disease in the abdomen, pelvis or skeleton. 5. Chronic findings include: Aortic Atherosclerosis (ICD10-I70.0) and Emphysema (ICD10-J43.9). Trace dependent left pleural effusion. 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 Moderate sigmoid diverticulosis. Bilateral hydroceles, left greater than right.   Impression and  Plan:  79 year old man with:    1.  Stage IV squamous cell carcinoma of the lung with bilateral lymphadenopathy diagnosed in September 2021.   He is currently receiving Pembrolizumab without any major complaints. Imaging studies obtained on 05/08/2020 were personally reviewed and discussed with the patient and showed clear progression of disease without any meaningful response. His left upper lobe mass has increased in size with normal right paratracheal and supraclavicular nodal involvement. Left lateral nodal metastasis has slightly declined.  The natural course of this disease was reviewed at this time and alternative treatment options were discussed. Systemic chemotherapy utilizing platinum based regimen were reviewed.  Single agent taxanes could also be considered given his age and borderline performance status.  Supportive care and hospice could also be a consideration.   After discussion today, he opted to proceed with supportive care and potential hospice enrollment in the future.  He fears the quality of life will be compromised with chemotherapy which is a reasonable assumption at this time.  He understands he has limited life expectancy likely in the neighborhood of months and would be at hospice eligible.  His wife will consider these options and let me know if she is interested in having hospice involved in the near future.   2.    Squamous cell carcinoma of the skin. No evidence of recurrence or metastasis at this time from this tumor.   3.  Antiemetics: Compazine has been effective in treating his nausea. No vomiting at this time.   4.  Prognosis and goals of care: His prognosis is poor with limited life expectancy.  We also talked about advanced directives and recommended DNR status for him moving forward.  5.  IV access: Port-A-Cath continues to be in use without any issues.  6.  Anemia: Multifactorial in nature with malignancy and chronic renal insufficiency are  contributing factors. Iron studies are pending from today.  7.  Follow-up: He will return in 3 weeks for repeat evaluation without confusion.  This appointment can be canceled if needed and will rely on hospice if they choose to proceed that way.    30  minutes were dedicated to this visit. Time spent on reviewing disease status, reviewing imaging studies and alternative treatment options.   [name], MD 05/13/2020 11:26 AM

## 2020-05-13 NOTE — Patient Instructions (Signed)

## 2020-05-13 NOTE — Addendum Note (Signed)
Addended by: Lenox Ponds E on: 05/13/2020 12:35 PM   Modules accepted: Orders, SmartSet

## 2020-05-13 NOTE — Progress Notes (Signed)
DISCONTINUE ON PATHWAY REGIMEN - Non-Small Cell Lung     A cycle is every 21 days:     Pembrolizumab   **Always confirm dose/schedule in your pharmacy ordering system**  REASON: Disease Progression PRIOR TREATMENT: [id number]: Pembrolizumab 200 mg q21 Days Until Disease Progression, Unacceptable Toxicity, or up to 24 Months TREATMENT RESPONSE: Progressive Disease (PD)  START ON PATHWAY REGIMEN - Non-Small Cell Lung     A cycle is every 21 days:     Docetaxel   **Always confirm dose/schedule in your pharmacy ordering system**  Patient Characteristics: Stage IV Metastatic, Squamous, PS = 2, Second Line, Prior PD-1/PD-L1  Inhibitor or Not a Candidate for Immunotherapy Therapeutic Status: Stage IV Metastatic Histology: Squamous Cell Line of therapy: Second Line ECOG Performance Status: 2 PD-L1 Expression Status: PD-L1 Positive ? 50% (TPS) Immunotherapy Candidate Status: Not a Candidate for Immunotherapy Prior Immunotherapy Status: Prior PD-1/PD-L1 Inhibitor Intent of Therapy: Non-Curative / Palliative Intent, Discussed with Patient

## 2020-05-18 ENCOUNTER — Telehealth: Payer: Self-pay | Admitting: *Deleted

## 2020-05-18 ENCOUNTER — Telehealth: Payer: Self-pay | Admitting: Endocrinology

## 2020-05-18 NOTE — Telephone Encounter (Signed)
Need to know what his blood sugars including on waking up for the last few days

## 2020-05-18 NOTE — Telephone Encounter (Signed)
Mario [name] called to advise that she contacted the cancer center to start the process for hospice for Mario [name] but that currently she concerned about the diabetic medicine he is taking and the very little amount of food intake.  Call back number is [phone]

## 2020-05-18 NOTE — Telephone Encounter (Signed)
Please advise, she wants to know how to adjust his insulin since he's really not eating much.

## 2020-05-18 NOTE — Telephone Encounter (Signed)
Call received from [facility] to go see Mario [name]. VO given.

## 2020-05-19 DIAGNOSIS — E782 Mixed hyperlipidemia: Secondary | ICD-10-CM | POA: Diagnosis not present

## 2020-05-19 DIAGNOSIS — I2510 Atherosclerotic heart disease of native coronary artery without angina pectoris: Secondary | ICD-10-CM | POA: Diagnosis not present

## 2020-05-19 DIAGNOSIS — C3492 Malignant neoplasm of unspecified part of left bronchus or lung: Secondary | ICD-10-CM | POA: Diagnosis not present

## 2020-05-19 DIAGNOSIS — E1151 Type 2 diabetes mellitus with diabetic peripheral angiopathy without gangrene: Secondary | ICD-10-CM | POA: Diagnosis not present

## 2020-05-19 DIAGNOSIS — E7800 Pure hypercholesterolemia, unspecified: Secondary | ICD-10-CM | POA: Diagnosis not present

## 2020-05-19 DIAGNOSIS — E1122 Type 2 diabetes mellitus with diabetic chronic kidney disease: Secondary | ICD-10-CM | POA: Diagnosis not present

## 2020-05-19 DIAGNOSIS — E11319 Type 2 diabetes mellitus with unspecified diabetic retinopathy without macular edema: Secondary | ICD-10-CM | POA: Diagnosis not present

## 2020-05-19 DIAGNOSIS — C3490 Malignant neoplasm of unspecified part of unspecified bronchus or lung: Secondary | ICD-10-CM | POA: Diagnosis not present

## 2020-05-19 DIAGNOSIS — N401 Enlarged prostate with lower urinary tract symptoms: Secondary | ICD-10-CM | POA: Diagnosis not present

## 2020-05-19 DIAGNOSIS — N183 Chronic kidney disease, stage 3 unspecified: Secondary | ICD-10-CM | POA: Diagnosis not present

## 2020-05-19 DIAGNOSIS — I10 Essential (primary) hypertension: Secondary | ICD-10-CM | POA: Diagnosis not present

## 2020-05-19 NOTE — Telephone Encounter (Signed)
I spoke with Mr. Hearn wife, she said Hospice came in and stopped all his medications, he was unable to swallow and didn't think he would make it through yesterday, she said he's not taking in any food and has not been checking his sugars.

## 2020-05-19 NOTE — Telephone Encounter (Signed)
Noted  

## 2020-05-25 ENCOUNTER — Telehealth: Payer: Self-pay | Admitting: *Deleted

## 2020-05-25 NOTE — Telephone Encounter (Signed)
Faxed signed CTI and Plan of Care to [facility]. [phone].  Fax confirmation received.

## 2020-05-26 ENCOUNTER — Other Ambulatory Visit: Payer: Self-pay

## 2020-06-05 ENCOUNTER — Ambulatory Visit: Payer: HMO | Admitting: Oncology

## 2020-06-05 ENCOUNTER — Ambulatory Visit: Payer: HMO

## 2020-06-05 ENCOUNTER — Other Ambulatory Visit: Payer: HMO

## 2020-06-23 DEATH — deceased

## 2020-07-14 ENCOUNTER — Other Ambulatory Visit: Payer: HMO

## 2020-07-16 ENCOUNTER — Ambulatory Visit: Payer: HMO | Admitting: Endocrinology

## 2020-08-19 ENCOUNTER — Ambulatory Visit: Payer: HMO | Admitting: Podiatry

## 2021-03-26 ENCOUNTER — Encounter (INDEPENDENT_AMBULATORY_CARE_PROVIDER_SITE_OTHER): Payer: HMO | Admitting: Ophthalmology
